# Patient Record
Sex: Female | Born: 1970 | Race: White | Hispanic: No | Smoking: Current every day smoker
Health system: Southern US, Community
[De-identification: ages and names within clinical notes are randomized; demographics above are authoritative.]

## PROBLEM LIST (undated history)

## (undated) DIAGNOSIS — M797 Fibromyalgia: Secondary | ICD-10-CM

## (undated) DIAGNOSIS — K219 Gastro-esophageal reflux disease without esophagitis: Secondary | ICD-10-CM

## (undated) DIAGNOSIS — I3139 Other pericardial effusion (noninflammatory): Secondary | ICD-10-CM

## (undated) DIAGNOSIS — Z8719 Personal history of other diseases of the digestive system: Secondary | ICD-10-CM

## (undated) DIAGNOSIS — M1990 Unspecified osteoarthritis, unspecified site: Secondary | ICD-10-CM

## (undated) DIAGNOSIS — Z86018 Personal history of other benign neoplasm: Secondary | ICD-10-CM

## (undated) DIAGNOSIS — G40909 Epilepsy, unspecified, not intractable, without status epilepticus: Secondary | ICD-10-CM

## (undated) DIAGNOSIS — G47419 Narcolepsy without cataplexy: Secondary | ICD-10-CM

## (undated) DIAGNOSIS — N186 End stage renal disease: Secondary | ICD-10-CM

## (undated) DIAGNOSIS — D649 Anemia, unspecified: Secondary | ICD-10-CM

## (undated) DIAGNOSIS — N289 Disorder of kidney and ureter, unspecified: Secondary | ICD-10-CM

## (undated) DIAGNOSIS — K5900 Constipation, unspecified: Secondary | ICD-10-CM

## (undated) DIAGNOSIS — R51 Headache: Secondary | ICD-10-CM

## (undated) DIAGNOSIS — E2740 Unspecified adrenocortical insufficiency: Secondary | ICD-10-CM

## (undated) DIAGNOSIS — F329 Major depressive disorder, single episode, unspecified: Secondary | ICD-10-CM

## (undated) DIAGNOSIS — E039 Hypothyroidism, unspecified: Secondary | ICD-10-CM

## (undated) DIAGNOSIS — Z992 Dependence on renal dialysis: Secondary | ICD-10-CM

## (undated) DIAGNOSIS — F419 Anxiety disorder, unspecified: Secondary | ICD-10-CM

## (undated) DIAGNOSIS — I639 Cerebral infarction, unspecified: Secondary | ICD-10-CM

## (undated) DIAGNOSIS — J45909 Unspecified asthma, uncomplicated: Secondary | ICD-10-CM

## (undated) DIAGNOSIS — I313 Pericardial effusion (noninflammatory): Secondary | ICD-10-CM

## (undated) DIAGNOSIS — Z87442 Personal history of urinary calculi: Secondary | ICD-10-CM

## (undated) DIAGNOSIS — G8929 Other chronic pain: Secondary | ICD-10-CM

## (undated) DIAGNOSIS — Z9289 Personal history of other medical treatment: Secondary | ICD-10-CM

## (undated) DIAGNOSIS — M549 Dorsalgia, unspecified: Secondary | ICD-10-CM

## (undated) DIAGNOSIS — R011 Cardiac murmur, unspecified: Secondary | ICD-10-CM

## (undated) DIAGNOSIS — Z86718 Personal history of other venous thrombosis and embolism: Secondary | ICD-10-CM

## (undated) DIAGNOSIS — F32A Depression, unspecified: Secondary | ICD-10-CM

## (undated) DIAGNOSIS — J42 Unspecified chronic bronchitis: Secondary | ICD-10-CM

## (undated) DIAGNOSIS — I314 Cardiac tamponade: Secondary | ICD-10-CM

## (undated) DIAGNOSIS — F3130 Bipolar disorder, current episode depressed, mild or moderate severity, unspecified: Secondary | ICD-10-CM

## (undated) DIAGNOSIS — H547 Unspecified visual loss: Secondary | ICD-10-CM

## (undated) DIAGNOSIS — J189 Pneumonia, unspecified organism: Secondary | ICD-10-CM

## (undated) DIAGNOSIS — I219 Acute myocardial infarction, unspecified: Secondary | ICD-10-CM

## (undated) DIAGNOSIS — E230 Hypopituitarism: Secondary | ICD-10-CM

## (undated) DIAGNOSIS — E785 Hyperlipidemia, unspecified: Secondary | ICD-10-CM

## (undated) HISTORY — PX: DG AV DIALYSIS GRAFT DECLOT OR: HXRAD813

---
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
## 1999-11-01 HISTORY — PX: LAPAROSCOPIC CHOLECYSTECTOMY: SUR755

## 1999-11-01 HISTORY — PX: BREAST LUMPECTOMY: SHX2

## 2007-01-14 ENCOUNTER — Emergency Department: Payer: Self-pay | Admitting: General Practice

## 2007-01-29 ENCOUNTER — Emergency Department: Payer: Self-pay | Admitting: Internal Medicine

## 2007-02-06 ENCOUNTER — Emergency Department: Payer: Self-pay | Admitting: Emergency Medicine

## 2007-02-07 ENCOUNTER — Ambulatory Visit: Payer: Self-pay | Admitting: Emergency Medicine

## 2007-03-07 ENCOUNTER — Emergency Department: Payer: Self-pay | Admitting: Emergency Medicine

## 2007-03-14 ENCOUNTER — Emergency Department: Payer: Self-pay | Admitting: Emergency Medicine

## 2007-03-15 ENCOUNTER — Emergency Department: Payer: Self-pay | Admitting: Emergency Medicine

## 2007-07-11 ENCOUNTER — Emergency Department: Payer: Self-pay | Admitting: Emergency Medicine

## 2007-07-17 ENCOUNTER — Emergency Department: Payer: Self-pay | Admitting: Emergency Medicine

## 2007-07-17 ENCOUNTER — Inpatient Hospital Stay: Payer: Self-pay | Admitting: *Deleted

## 2007-08-29 ENCOUNTER — Emergency Department: Payer: Self-pay | Admitting: Emergency Medicine

## 2007-11-15 ENCOUNTER — Other Ambulatory Visit: Payer: Self-pay

## 2007-11-15 ENCOUNTER — Emergency Department: Payer: Self-pay | Admitting: Unknown Physician Specialty

## 2007-11-22 ENCOUNTER — Emergency Department: Payer: Self-pay | Admitting: Emergency Medicine

## 2007-12-18 ENCOUNTER — Emergency Department: Payer: Self-pay | Admitting: Emergency Medicine

## 2008-02-20 ENCOUNTER — Ambulatory Visit: Payer: Self-pay | Admitting: Family Medicine

## 2008-04-20 ENCOUNTER — Emergency Department: Payer: Self-pay | Admitting: Emergency Medicine

## 2008-11-25 ENCOUNTER — Emergency Department: Payer: Self-pay | Admitting: Emergency Medicine

## 2010-10-17 ENCOUNTER — Inpatient Hospital Stay: Payer: Self-pay | Admitting: Internal Medicine

## 2010-10-27 ENCOUNTER — Ambulatory Visit: Payer: Self-pay | Admitting: Family Medicine

## 2010-11-07 ENCOUNTER — Emergency Department: Payer: Self-pay | Admitting: Emergency Medicine

## 2010-12-16 ENCOUNTER — Emergency Department: Payer: Self-pay | Admitting: Emergency Medicine

## 2011-01-12 ENCOUNTER — Ambulatory Visit: Payer: Self-pay | Admitting: Pain Medicine

## 2011-04-14 ENCOUNTER — Ambulatory Visit: Payer: Self-pay | Admitting: Internal Medicine

## 2011-04-16 ENCOUNTER — Inpatient Hospital Stay: Payer: Self-pay | Admitting: Internal Medicine

## 2011-04-16 ENCOUNTER — Ambulatory Visit: Payer: Self-pay | Admitting: Family Medicine

## 2011-05-11 ENCOUNTER — Ambulatory Visit: Payer: Self-pay | Admitting: Family Medicine

## 2011-11-30 ENCOUNTER — Emergency Department: Payer: Self-pay | Admitting: Emergency Medicine

## 2011-11-30 LAB — COMPREHENSIVE METABOLIC PANEL
Albumin: 2 g/dL — ABNORMAL LOW (ref 3.4–5.0)
Anion Gap: 9 (ref 7–16)
BUN: 22 mg/dL — ABNORMAL HIGH (ref 7–18)
Bilirubin,Total: 0.3 mg/dL (ref 0.2–1.0)
Calcium, Total: 8.1 mg/dL — ABNORMAL LOW (ref 8.5–10.1)
Co2: 23 mmol/L (ref 21–32)
EGFR (Non-African Amer.): 51 — ABNORMAL LOW
Glucose: 90 mg/dL (ref 65–99)
Osmolality: 275 (ref 275–301)
Potassium: 5.1 mmol/L (ref 3.5–5.1)
Sodium: 136 mmol/L (ref 136–145)
Total Protein: 6.4 g/dL (ref 6.4–8.2)

## 2011-11-30 LAB — CBC WITH DIFFERENTIAL/PLATELET
Basophil #: 0.1 10*3/uL (ref 0.0–0.1)
Basophil %: 0.2 %
Eosinophil #: 0.1 10*3/uL (ref 0.0–0.7)
HGB: 13.4 g/dL (ref 12.0–16.0)
MCH: 30.1 pg (ref 26.0–34.0)
MCHC: 33 g/dL (ref 32.0–36.0)
Monocyte #: 0.2 10*3/uL (ref 0.0–0.7)
Neutrophil #: 27.6 10*3/uL — ABNORMAL HIGH (ref 1.4–6.5)
Neutrophil %: 90.8 %
RDW: 16.2 % — ABNORMAL HIGH (ref 11.5–14.5)

## 2011-12-20 ENCOUNTER — Emergency Department: Payer: Self-pay | Admitting: *Deleted

## 2011-12-20 LAB — CBC WITH DIFFERENTIAL/PLATELET
Basophil #: 0 10*3/uL (ref 0.0–0.1)
Eosinophil #: 0.2 10*3/uL (ref 0.0–0.7)
Eosinophil %: 1.4 %
Lymphocyte #: 1.7 10*3/uL (ref 1.0–3.6)
Lymphocyte %: 15.7 %
Lymphocytes: 18 %
MCV: 91 fL (ref 80–100)
Monocyte %: 1 %
Neutrophil %: 81.9 %
Platelet: 281 10*3/uL (ref 150–440)
RBC: 4.54 10*6/uL (ref 3.80–5.20)
RDW: 16.7 % — ABNORMAL HIGH (ref 11.5–14.5)
Segmented Neutrophils: 77 %
WBC: 10.8 10*3/uL (ref 3.6–11.0)

## 2011-12-20 LAB — COMPREHENSIVE METABOLIC PANEL
Albumin: 2 g/dL — ABNORMAL LOW (ref 3.4–5.0)
Alkaline Phosphatase: 161 U/L — ABNORMAL HIGH (ref 50–136)
Anion Gap: 13 (ref 7–16)
BUN: 24 mg/dL — ABNORMAL HIGH (ref 7–18)
Calcium, Total: 8.3 mg/dL — ABNORMAL LOW (ref 8.5–10.1)
Chloride: 112 mmol/L — ABNORMAL HIGH (ref 98–107)
EGFR (Non-African Amer.): 33 — ABNORMAL LOW
Glucose: 82 mg/dL (ref 65–99)
SGOT(AST): 28 U/L (ref 15–37)
SGPT (ALT): 12 U/L
Total Protein: 6.2 g/dL — ABNORMAL LOW (ref 6.4–8.2)

## 2011-12-20 LAB — TROPONIN I: Troponin-I: <0.02 ng/mL

## 2011-12-20 LAB — CK TOTAL AND CKMB (NOT AT ARMC): CK, Total: 46 U/L (ref 21–215)

## 2012-03-06 ENCOUNTER — Ambulatory Visit: Payer: Self-pay | Admitting: Vascular Surgery

## 2012-03-06 LAB — POTASSIUM: Potassium: 4.6 mmol/L (ref 3.5–5.1)

## 2012-03-11 ENCOUNTER — Emergency Department: Payer: Self-pay | Admitting: Emergency Medicine

## 2012-03-12 ENCOUNTER — Ambulatory Visit: Payer: Self-pay | Admitting: Vascular Surgery

## 2012-08-23 ENCOUNTER — Ambulatory Visit: Payer: Self-pay | Admitting: Family Medicine

## 2012-08-23 LAB — URINALYSIS, COMPLETE
Glucose,UR: NEGATIVE mg/dL (ref 0–75)
Ketone: NEGATIVE
Nitrite: NEGATIVE
Specific Gravity: 1.015 (ref 1.003–1.030)

## 2012-08-25 LAB — URINE CULTURE

## 2012-10-17 ENCOUNTER — Ambulatory Visit: Payer: Self-pay | Admitting: Vascular Surgery

## 2012-10-22 ENCOUNTER — Emergency Department: Payer: Self-pay | Admitting: Emergency Medicine

## 2012-11-06 ENCOUNTER — Ambulatory Visit: Payer: Self-pay | Admitting: Vascular Surgery

## 2012-12-08 ENCOUNTER — Emergency Department: Payer: Self-pay | Admitting: Emergency Medicine

## 2012-12-08 LAB — COMPREHENSIVE METABOLIC PANEL
Albumin: 3 g/dL — ABNORMAL LOW (ref 3.4–5.0)
BUN: 64 mg/dL — ABNORMAL HIGH (ref 7–18)
Bilirubin,Total: 0.2 mg/dL (ref 0.2–1.0)
Chloride: 103 mmol/L (ref 98–107)
Creatinine: 4.7 mg/dL — ABNORMAL HIGH (ref 0.60–1.30)
EGFR (African American): 13 — ABNORMAL LOW
EGFR (Non-African Amer.): 11 — ABNORMAL LOW
Glucose: 75 mg/dL (ref 65–99)
Potassium: 4.1 mmol/L (ref 3.5–5.1)
SGOT(AST): 31 U/L (ref 15–37)

## 2012-12-08 LAB — CBC WITH DIFFERENTIAL/PLATELET
Eosinophil: 3 %
HCT: 29.8 % — ABNORMAL LOW (ref 35.0–47.0)
HGB: 9.7 g/dL — ABNORMAL LOW (ref 12.0–16.0)
Lymphocytes: 21 %
MCH: 31.6 pg (ref 26.0–34.0)
MCV: 97 fL (ref 80–100)
Monocytes: 3 %
RBC: 3.07 10*6/uL — ABNORMAL LOW (ref 3.80–5.20)
RDW: 16.2 % — ABNORMAL HIGH (ref 11.5–14.5)
Segmented Neutrophils: 72 %
WBC: 12.6 10*3/uL — ABNORMAL HIGH (ref 3.6–11.0)

## 2012-12-28 ENCOUNTER — Ambulatory Visit: Payer: Self-pay | Admitting: Family Medicine

## 2013-01-12 ENCOUNTER — Inpatient Hospital Stay: Payer: Self-pay | Admitting: Specialist

## 2013-01-12 LAB — URINALYSIS, COMPLETE
Bacteria: NONE SEEN
Bilirubin,UR: NEGATIVE
Glucose,UR: NEGATIVE mg/dL (ref 0–75)
Ketone: NEGATIVE
Nitrite: NEGATIVE
Ph: 8 (ref 4.5–8.0)
Protein: 100
RBC,UR: 4 /HPF (ref 0–5)
Squamous Epithelial: <1
WBC UR: 9 /HPF (ref 0–5)

## 2013-01-12 LAB — CBC
HCT: 38.7 % (ref 35.0–47.0)
MCH: 31.5 pg (ref 26.0–34.0)
MCHC: 32.8 g/dL (ref 32.0–36.0)
Platelet: 216 10*3/uL (ref 150–440)
RBC: 4.03 10*6/uL (ref 3.80–5.20)
RDW: 16.8 % — ABNORMAL HIGH (ref 11.5–14.5)
WBC: 13.9 10*3/uL — ABNORMAL HIGH (ref 3.6–11.0)

## 2013-01-12 LAB — COMPREHENSIVE METABOLIC PANEL
Albumin: 3.5 g/dL (ref 3.4–5.0)
Alkaline Phosphatase: 83 U/L (ref 50–136)
Anion Gap: 11 (ref 7–16)
Bilirubin,Total: 0.6 mg/dL (ref 0.2–1.0)
Chloride: 101 mmol/L (ref 98–107)
Creatinine: 4.14 mg/dL — ABNORMAL HIGH (ref 0.60–1.30)
EGFR (African American): 15 — ABNORMAL LOW
EGFR (Non-African Amer.): 13 — ABNORMAL LOW
Glucose: 74 mg/dL (ref 65–99)
Osmolality: 280 (ref 275–301)
SGOT(AST): 54 U/L — ABNORMAL HIGH (ref 15–37)
SGPT (ALT): 22 U/L (ref 12–78)
Sodium: 135 mmol/L — ABNORMAL LOW (ref 136–145)
Total Protein: 7.4 g/dL (ref 6.4–8.2)

## 2013-01-12 LAB — PHOSPHORUS: Phosphorus: 4.6 mg/dL (ref 2.5–4.9)

## 2013-01-12 LAB — MAGNESIUM: Magnesium: 2.4 mg/dL

## 2013-01-12 LAB — CK TOTAL AND CKMB (NOT AT ARMC): CK, Total: 73 U/L (ref 21–215)

## 2013-01-13 LAB — BASIC METABOLIC PANEL
BUN: 47 mg/dL — ABNORMAL HIGH (ref 7–18)
Calcium, Total: 7.6 mg/dL — ABNORMAL LOW (ref 8.5–10.1)
Chloride: 105 mmol/L (ref 98–107)
Co2: 25 mmol/L (ref 21–32)
Creatinine: 4.48 mg/dL — ABNORMAL HIGH (ref 0.60–1.30)
EGFR (African American): 13 — ABNORMAL LOW
EGFR (Non-African Amer.): 12 — ABNORMAL LOW
Sodium: 140 mmol/L (ref 136–145)

## 2013-01-13 LAB — CBC WITH DIFFERENTIAL/PLATELET
Basophil %: 0.9 %
HCT: 32.7 % — ABNORMAL LOW (ref 35.0–47.0)
HGB: 10.5 g/dL — ABNORMAL LOW (ref 12.0–16.0)
Lymphocyte #: 2.2 10*3/uL (ref 1.0–3.6)
Lymphocyte %: 27.8 %
MCH: 30.8 pg (ref 26.0–34.0)
MCV: 96 fL (ref 80–100)
Monocyte #: 0.4 x10 3/mm (ref 0.2–0.9)
Neutrophil #: 4.9 10*3/uL (ref 1.4–6.5)
Platelet: 211 10*3/uL (ref 150–440)
RBC: 3.39 10*6/uL — ABNORMAL LOW (ref 3.80–5.20)
WBC: 8 10*3/uL (ref 3.6–11.0)

## 2013-01-13 LAB — CLOSTRIDIUM DIFFICILE BY PCR

## 2013-01-13 LAB — MAGNESIUM: Magnesium: 2.2 mg/dL

## 2013-04-02 ENCOUNTER — Ambulatory Visit: Payer: Self-pay | Admitting: Vascular Surgery

## 2013-04-02 LAB — SODIUM: Sodium: 140 mmol/L (ref 136–145)

## 2013-04-14 ENCOUNTER — Emergency Department: Payer: Self-pay | Admitting: Emergency Medicine

## 2013-04-15 LAB — COMPREHENSIVE METABOLIC PANEL
Anion Gap: 6 — ABNORMAL LOW (ref 7–16)
BUN: 25 mg/dL — ABNORMAL HIGH (ref 7–18)
Bilirubin,Total: 0.7 mg/dL (ref 0.2–1.0)
Calcium, Total: 8.2 mg/dL — ABNORMAL LOW (ref 8.5–10.1)
Chloride: 100 mmol/L (ref 98–107)
Co2: 30 mmol/L (ref 21–32)
Glucose: 109 mg/dL — ABNORMAL HIGH (ref 65–99)
Osmolality: 277 (ref 275–301)
Potassium: 4 mmol/L (ref 3.5–5.1)
SGPT (ALT): 24 U/L (ref 12–78)
Total Protein: 6.3 g/dL — ABNORMAL LOW (ref 6.4–8.2)

## 2013-04-15 LAB — CBC
MCH: 31.8 pg (ref 26.0–34.0)
Platelet: 251 10*3/uL (ref 150–440)
WBC: 12.7 10*3/uL — ABNORMAL HIGH (ref 3.6–11.0)

## 2013-04-29 ENCOUNTER — Inpatient Hospital Stay: Payer: Self-pay | Admitting: Internal Medicine

## 2013-04-29 LAB — COMPREHENSIVE METABOLIC PANEL
Calcium, Total: 8.1 mg/dL — ABNORMAL LOW (ref 8.5–10.1)
Chloride: 94 mmol/L — ABNORMAL LOW (ref 98–107)
EGFR (Non-African Amer.): 12 — ABNORMAL LOW
Potassium: 5.5 mmol/L — ABNORMAL HIGH (ref 3.5–5.1)
SGPT (ALT): 23 U/L (ref 12–78)
Total Protein: 6.4 g/dL (ref 6.4–8.2)

## 2013-04-29 LAB — DRUG SCREEN, URINE
Amphetamines, Ur Screen: NEGATIVE (ref ?–1000)
Barbiturates, Ur Screen: NEGATIVE (ref ?–200)
Benzodiazepine, Ur Scrn: NEGATIVE (ref ?–200)
Cannabinoid 50 Ng, Ur ~~LOC~~: NEGATIVE (ref ?–50)
MDMA (Ecstasy)Ur Screen: NEGATIVE (ref ?–500)
Methadone, Ur Screen: NEGATIVE (ref ?–300)
Phencyclidine (PCP) Ur S: NEGATIVE (ref ?–25)

## 2013-04-29 LAB — CBC WITH DIFFERENTIAL/PLATELET
Eosinophil #: 0.1 10*3/uL (ref 0.0–0.7)
Eosinophil %: 0.3 %
HCT: 31.2 % — ABNORMAL LOW (ref 35.0–47.0)
MCHC: 33 g/dL (ref 32.0–36.0)
Monocyte %: 1.9 %
Platelet: 257 10*3/uL (ref 150–440)
RBC: 3.26 10*6/uL — ABNORMAL LOW (ref 3.80–5.20)
RDW: 15.7 % — ABNORMAL HIGH (ref 11.5–14.5)

## 2013-04-29 LAB — BASIC METABOLIC PANEL
Anion Gap: 8 (ref 7–16)
BUN: 29 mg/dL — ABNORMAL HIGH (ref 7–18)
Chloride: 96 mmol/L — ABNORMAL LOW (ref 98–107)
EGFR (African American): 12 — ABNORMAL LOW
EGFR (Non-African Amer.): 11 — ABNORMAL LOW

## 2013-04-29 LAB — URINALYSIS, COMPLETE
Bilirubin,UR: NEGATIVE
Blood: NEGATIVE
Leukocyte Esterase: NEGATIVE
Nitrite: NEGATIVE
Ph: 7 (ref 4.5–8.0)
RBC,UR: <1 /HPF (ref 0–5)
Squamous Epithelial: NONE SEEN
WBC UR: 1 /HPF (ref 0–5)

## 2013-04-29 LAB — TROPONIN I: Troponin-I: <0.02 ng/mL

## 2013-04-29 LAB — CK TOTAL AND CKMB (NOT AT ARMC)
CK, Total: 61 U/L (ref 21–215)
CK, Total: 42 U/L (ref 21–215)

## 2013-04-30 LAB — CBC WITH DIFFERENTIAL/PLATELET
Basophil %: 0.5 %
Eosinophil %: 0.1 %
HGB: 9.5 g/dL — ABNORMAL LOW (ref 12.0–16.0)
Lymphocyte %: 6.5 %
MCH: 32.3 pg (ref 26.0–34.0)
Monocyte #: 0.6 x10 3/mm (ref 0.2–0.9)
Neutrophil #: 21.3 10*3/uL — ABNORMAL HIGH (ref 1.4–6.5)
RBC: 2.93 10*6/uL — ABNORMAL LOW (ref 3.80–5.20)
RDW: 16 % — ABNORMAL HIGH (ref 11.5–14.5)

## 2013-04-30 LAB — BASIC METABOLIC PANEL
Anion Gap: 13 (ref 7–16)
BUN: 42 mg/dL — ABNORMAL HIGH (ref 7–18)
Calcium, Total: 8.4 mg/dL — ABNORMAL LOW (ref 8.5–10.1)
Co2: 24 mmol/L (ref 21–32)
EGFR (African American): 11 — ABNORMAL LOW
Glucose: 90 mg/dL (ref 65–99)
Osmolality: 265 (ref 275–301)

## 2013-05-01 LAB — CBC WITH DIFFERENTIAL/PLATELET
Basophil #: 0 10*3/uL (ref 0.0–0.1)
Basophil %: 0 %
Eosinophil #: 0 10*3/uL (ref 0.0–0.7)
MCHC: 33.8 g/dL (ref 32.0–36.0)
Monocyte #: 0.5 x10 3/mm (ref 0.2–0.9)
Monocyte %: 2.9 %
Neutrophil %: 88.4 %
Platelet: 260 10*3/uL (ref 150–440)

## 2013-05-04 LAB — CULTURE, BLOOD (SINGLE)

## 2013-07-04 ENCOUNTER — Inpatient Hospital Stay: Payer: Self-pay | Admitting: Internal Medicine

## 2013-07-04 LAB — URINALYSIS, COMPLETE
Blood: NEGATIVE
Glucose,UR: NEGATIVE mg/dL (ref 0–75)
Ketone: NEGATIVE
Nitrite: NEGATIVE
Ph: 6 (ref 4.5–8.0)
Protein: 100

## 2013-07-04 LAB — CBC
MCHC: 34 g/dL (ref 32.0–36.0)
MCV: 97 fL (ref 80–100)
RDW: 15.8 % — ABNORMAL HIGH (ref 11.5–14.5)

## 2013-07-04 LAB — CK TOTAL AND CKMB (NOT AT ARMC)
CK, Total: 34 U/L (ref 21–215)
CK-MB: <0.5 ng/mL — ABNORMAL LOW (ref 0.5–3.6)

## 2013-07-04 LAB — COMPREHENSIVE METABOLIC PANEL
Albumin: 3 g/dL — ABNORMAL LOW (ref 3.4–5.0)
Alkaline Phosphatase: 56 U/L (ref 50–136)
BUN: 84 mg/dL — ABNORMAL HIGH (ref 7–18)
Bilirubin,Total: 0.6 mg/dL (ref 0.2–1.0)
Calcium, Total: 7.9 mg/dL — ABNORMAL LOW (ref 8.5–10.1)
Chloride: 92 mmol/L — ABNORMAL LOW (ref 98–107)
Co2: 25 mmol/L (ref 21–32)
Creatinine: 5.39 mg/dL — ABNORMAL HIGH (ref 0.60–1.30)
EGFR (African American): 11 — ABNORMAL LOW
EGFR (Non-African Amer.): 9 — ABNORMAL LOW
Glucose: 78 mg/dL (ref 65–99)
Osmolality: 280 (ref 275–301)
Sodium: 127 mmol/L — ABNORMAL LOW (ref 136–145)
Total Protein: 6.3 g/dL — ABNORMAL LOW (ref 6.4–8.2)

## 2013-07-04 LAB — TROPONIN I: Troponin-I: <0.02 ng/mL

## 2013-07-04 LAB — PROTIME-INR: INR: 1.2

## 2013-07-04 LAB — APTT: Activated PTT: 25.2 secs (ref 23.6–35.9)

## 2013-07-05 LAB — BASIC METABOLIC PANEL WITH GFR
Anion Gap: 10
BUN: 89 mg/dL — ABNORMAL HIGH
Calcium, Total: 7.6 mg/dL — ABNORMAL LOW
Chloride: 93 mmol/L — ABNORMAL LOW
Co2: 26 mmol/L
Creatinine: 5.69 mg/dL — ABNORMAL HIGH
EGFR (African American): 10 — ABNORMAL LOW
EGFR (Non-African Amer.): 9 — ABNORMAL LOW
Glucose: 66 mg/dL
Osmolality: 284
Potassium: 3.6 mmol/L
Sodium: 129 mmol/L — ABNORMAL LOW

## 2013-07-05 LAB — CBC WITH DIFFERENTIAL/PLATELET
Basophil #: 0.1 x10 3/mm 3
Basophil %: 0.9 %
Eosinophil #: 0.2 x10 3/mm 3
Eosinophil %: 2.4 %
HCT: 28.5 % — ABNORMAL LOW
HGB: 9.6 g/dL — ABNORMAL LOW
Lymphocyte %: 30 %
Lymphs Abs: 2.6 x10 3/mm 3
MCH: 32.9 pg
MCHC: 33.8 g/dL
MCV: 98 fL
Monocyte #: 0.4 "x10 3/mm "
Monocyte %: 4.5 %
Neutrophil #: 5.4 x10 3/mm 3
Neutrophil %: 62.2 %
Platelet: 263 x10 3/mm 3
RBC: 2.92 X10 6/mm 3 — ABNORMAL LOW
RDW: 15.1 % — ABNORMAL HIGH
WBC: 8.7 x10 3/mm 3

## 2013-07-05 LAB — TSH: Thyroid Stimulating Horm: <0.01 u[IU]/mL — ABNORMAL LOW

## 2013-07-05 LAB — MAGNESIUM: Magnesium: 2.9 mg/dL — ABNORMAL HIGH

## 2013-07-06 ENCOUNTER — Ambulatory Visit: Payer: Self-pay | Admitting: Orthopedic Surgery

## 2013-07-07 LAB — BASIC METABOLIC PANEL
Anion Gap: 6 — ABNORMAL LOW (ref 7–16)
Calcium, Total: 7.6 mg/dL — ABNORMAL LOW (ref 8.5–10.1)
Chloride: 96 mmol/L — ABNORMAL LOW (ref 98–107)
Co2: 31 mmol/L (ref 21–32)
Creatinine: 2.93 mg/dL — ABNORMAL HIGH (ref 0.60–1.30)
EGFR (African American): 22 — ABNORMAL LOW
Osmolality: 267 (ref 275–301)
Potassium: 3.4 mmol/L — ABNORMAL LOW (ref 3.5–5.1)

## 2013-07-18 ENCOUNTER — Emergency Department: Payer: Self-pay | Admitting: Emergency Medicine

## 2013-07-18 LAB — URINALYSIS, COMPLETE
Glucose,UR: NEGATIVE mg/dL (ref 0–75)
Leukocyte Esterase: NEGATIVE
Nitrite: NEGATIVE
Ph: 7 (ref 4.5–8.0)
Protein: 100
RBC,UR: 1 /HPF (ref 0–5)
Specific Gravity: 1.005 (ref 1.003–1.030)
WBC UR: 1 /HPF (ref 0–5)

## 2013-07-18 LAB — CBC WITH DIFFERENTIAL/PLATELET
Basophil #: 0.1 10*3/uL (ref 0.0–0.1)
Basophil %: 1.2 %
HCT: 32 % — ABNORMAL LOW (ref 35.0–47.0)
Lymphocyte #: 1.7 10*3/uL (ref 1.0–3.6)
Lymphocyte %: 21.6 %
MCHC: 34.3 g/dL (ref 32.0–36.0)
MCV: 97 fL (ref 80–100)
Monocyte #: 0.6 x10 3/mm (ref 0.2–0.9)
Neutrophil #: 5.4 10*3/uL (ref 1.4–6.5)
RDW: 15.1 % — ABNORMAL HIGH (ref 11.5–14.5)
WBC: 7.9 10*3/uL (ref 3.6–11.0)

## 2013-07-18 LAB — PROTIME-INR
INR: 1.2
Prothrombin Time: 15.1 secs — ABNORMAL HIGH (ref 11.5–14.7)

## 2013-07-18 LAB — COMPREHENSIVE METABOLIC PANEL
Co2: 26 mmol/L (ref 21–32)
Creatinine: 5.63 mg/dL — ABNORMAL HIGH (ref 0.60–1.30)
EGFR (African American): 10 — ABNORMAL LOW
Glucose: 82 mg/dL (ref 65–99)
Osmolality: 277 (ref 275–301)
SGOT(AST): 37 U/L (ref 15–37)
SGPT (ALT): 20 U/L (ref 12–78)
Total Protein: 6.1 g/dL — ABNORMAL LOW (ref 6.4–8.2)

## 2013-07-18 LAB — TROPONIN I: Troponin-I: <0.02 ng/mL

## 2013-07-18 LAB — APTT: Activated PTT: 41.6 secs — ABNORMAL HIGH (ref 23.6–35.9)

## 2013-08-29 ENCOUNTER — Other Ambulatory Visit: Payer: Self-pay | Admitting: Nephrology

## 2013-08-29 LAB — POTASSIUM: Potassium: 4 mmol/L (ref 3.5–5.1)

## 2013-09-19 ENCOUNTER — Emergency Department: Payer: Self-pay | Admitting: Emergency Medicine

## 2013-09-20 LAB — CBC WITH DIFFERENTIAL/PLATELET
Basophil #: 0.1 10*3/uL (ref 0.0–0.1)
Basophil %: 0.6 %
Eosinophil #: 0.1 10*3/uL (ref 0.0–0.7)
Lymphocyte #: 1.2 10*3/uL (ref 1.0–3.6)
MCH: 33.2 pg (ref 26.0–34.0)
MCHC: 33.5 g/dL (ref 32.0–36.0)
MCV: 99 fL (ref 80–100)
Monocyte %: 2.1 %
Neutrophil #: 7.5 10*3/uL — ABNORMAL HIGH (ref 1.4–6.5)
Neutrophil %: 83.2 %
Platelet: 210 10*3/uL (ref 150–440)

## 2013-09-20 LAB — COMPREHENSIVE METABOLIC PANEL
Alkaline Phosphatase: 74 U/L (ref 50–136)
Anion Gap: 8 (ref 7–16)
Chloride: 97 mmol/L — ABNORMAL LOW (ref 98–107)
Co2: 28 mmol/L (ref 21–32)
Creatinine: 6.12 mg/dL — ABNORMAL HIGH (ref 0.60–1.30)
EGFR (Non-African Amer.): 8 — ABNORMAL LOW
Osmolality: 289 (ref 275–301)
Potassium: 5.5 mmol/L — ABNORMAL HIGH (ref 3.5–5.1)
SGOT(AST): 46 U/L — ABNORMAL HIGH (ref 15–37)
SGPT (ALT): 28 U/L (ref 12–78)
Total Protein: 6.6 g/dL (ref 6.4–8.2)

## 2013-09-24 ENCOUNTER — Emergency Department: Payer: Self-pay | Admitting: Emergency Medicine

## 2013-09-24 LAB — URINALYSIS, COMPLETE
Glucose,UR: NEGATIVE mg/dL (ref 0–75)
Hyaline Cast: 16
Ketone: NEGATIVE
Nitrite: NEGATIVE
Ph: 5 (ref 4.5–8.0)
Protein: 30
Squamous Epithelial: <1
WBC UR: 157 /HPF (ref 0–5)

## 2013-09-24 LAB — CBC
HCT: 30.3 % — ABNORMAL LOW (ref 35.0–47.0)
RBC: 3.04 10*6/uL — ABNORMAL LOW (ref 3.80–5.20)
RDW: 15.3 % — ABNORMAL HIGH (ref 11.5–14.5)
WBC: 9.4 10*3/uL (ref 3.6–11.0)

## 2013-09-24 LAB — COMPREHENSIVE METABOLIC PANEL
Albumin: 3.4 g/dL (ref 3.4–5.0)
Alkaline Phosphatase: 76 U/L
Anion Gap: 10 (ref 7–16)
BUN: 88 mg/dL — ABNORMAL HIGH (ref 7–18)
Calcium, Total: 8.5 mg/dL (ref 8.5–10.1)
Chloride: 95 mmol/L — ABNORMAL LOW (ref 98–107)
Creatinine: 6.57 mg/dL — ABNORMAL HIGH (ref 0.60–1.30)
EGFR (Non-African Amer.): 7 — ABNORMAL LOW
Glucose: 72 mg/dL (ref 65–99)
SGPT (ALT): 37 U/L (ref 12–78)
Sodium: 131 mmol/L — ABNORMAL LOW (ref 136–145)
Total Protein: 6.8 g/dL (ref 6.4–8.2)

## 2013-09-24 LAB — TROPONIN I: Troponin-I: <0.02 ng/mL

## 2013-10-28 ENCOUNTER — Emergency Department: Payer: Self-pay | Admitting: Emergency Medicine

## 2013-10-28 LAB — BASIC METABOLIC PANEL
Anion Gap: 9 (ref 7–16)
Calcium, Total: 7.2 mg/dL — ABNORMAL LOW (ref 8.5–10.1)
Chloride: 98 mmol/L (ref 98–107)
Co2: 26 mmol/L (ref 21–32)
Creatinine: 6.21 mg/dL — ABNORMAL HIGH (ref 0.60–1.30)
EGFR (Non-African Amer.): 8 — ABNORMAL LOW
Potassium: 5.4 mmol/L — ABNORMAL HIGH (ref 3.5–5.1)
Sodium: 133 mmol/L — ABNORMAL LOW (ref 136–145)

## 2013-10-28 LAB — CBC
HGB: 9.1 g/dL — ABNORMAL LOW (ref 12.0–16.0)
MCH: 34.1 pg — ABNORMAL HIGH (ref 26.0–34.0)
MCHC: 33.5 g/dL (ref 32.0–36.0)
MCV: 102 fL — ABNORMAL HIGH (ref 80–100)
Platelet: 251 10*3/uL (ref 150–440)
RBC: 2.67 10*6/uL — ABNORMAL LOW (ref 3.80–5.20)
WBC: 9.6 10*3/uL (ref 3.6–11.0)

## 2013-10-28 LAB — RAPID INFLUENZA A&B ANTIGENS

## 2013-10-28 LAB — TROPONIN I: Troponin-I: 0.02 ng/mL

## 2013-11-15 ENCOUNTER — Ambulatory Visit: Payer: Self-pay | Admitting: Physician Assistant

## 2013-11-15 LAB — CBC WITH DIFFERENTIAL/PLATELET
BASOS PCT: 1.5 %
Basophil #: 0.2 10*3/uL — ABNORMAL HIGH (ref 0.0–0.1)
Eosinophil #: 0.1 10*3/uL (ref 0.0–0.7)
Eosinophil %: 0.7 %
HCT: 29.5 % — AB (ref 35.0–47.0)
HGB: 9.4 g/dL — ABNORMAL LOW (ref 12.0–16.0)
Lymphocyte #: 1.9 10*3/uL (ref 1.0–3.6)
Lymphocyte %: 18.7 %
MCH: 34.2 pg — AB (ref 26.0–34.0)
MCHC: 32 g/dL (ref 32.0–36.0)
MCV: 107 fL — ABNORMAL HIGH (ref 80–100)
MONO ABS: 0.6 x10 3/mm (ref 0.2–0.9)
MONOS PCT: 5.8 %
Neutrophil #: 7.5 10*3/uL — ABNORMAL HIGH (ref 1.4–6.5)
Neutrophil %: 73.3 %
Platelet: 244 10*3/uL (ref 150–440)
RBC: 2.76 10*6/uL — ABNORMAL LOW (ref 3.80–5.20)
RDW: 18.2 % — ABNORMAL HIGH (ref 11.5–14.5)
WBC: 10.2 10*3/uL (ref 3.6–11.0)

## 2013-11-15 LAB — URINALYSIS, COMPLETE
Bilirubin,UR: NEGATIVE
Blood: NEGATIVE
Glucose,UR: NEGATIVE mg/dL (ref 0–75)
Ketone: NEGATIVE
LEUKOCYTE ESTERASE: NEGATIVE
NITRITE: NEGATIVE
PH: 8 (ref 4.5–8.0)
SPECIFIC GRAVITY: 1.005 (ref 1.003–1.030)

## 2013-11-15 LAB — BASIC METABOLIC PANEL
ANION GAP: 9 (ref 7–16)
BUN: 58 mg/dL — ABNORMAL HIGH (ref 7–18)
CALCIUM: 8.3 mg/dL — AB (ref 8.5–10.1)
CO2: 29 mmol/L (ref 21–32)
Chloride: 100 mmol/L (ref 98–107)
Creatinine: 5.28 mg/dL — ABNORMAL HIGH (ref 0.60–1.30)
EGFR (African American): 11 — ABNORMAL LOW
GFR CALC NON AF AMER: 9 — AB
GLUCOSE: 91 mg/dL (ref 65–99)
Osmolality: 291 (ref 275–301)
Potassium: 5.1 mmol/L (ref 3.5–5.1)
Sodium: 138 mmol/L (ref 136–145)

## 2013-11-15 LAB — RAPID STREP-A WITH REFLX: Micro Text Report: NEGATIVE

## 2013-11-18 LAB — BETA STREP CULTURE(ARMC)

## 2014-07-09 ENCOUNTER — Ambulatory Visit: Payer: Self-pay | Admitting: Internal Medicine

## 2014-07-17 ENCOUNTER — Ambulatory Visit: Payer: Self-pay

## 2014-11-25 ENCOUNTER — Emergency Department: Payer: Self-pay | Admitting: Emergency Medicine

## 2014-11-25 LAB — COMPREHENSIVE METABOLIC PANEL
ALBUMIN: 3.3 g/dL — AB (ref 3.4–5.0)
ALK PHOS: 69 U/L (ref 46–116)
ANION GAP: 15 (ref 7–16)
BILIRUBIN TOTAL: 0.4 mg/dL (ref 0.2–1.0)
BUN: 56 mg/dL — AB (ref 7–18)
Calcium, Total: 7.6 mg/dL — ABNORMAL LOW (ref 8.5–10.1)
Chloride: 100 mmol/L (ref 98–107)
Co2: 25 mmol/L (ref 21–32)
Creatinine: 6.17 mg/dL — ABNORMAL HIGH (ref 0.60–1.30)
EGFR (African American): 10 — ABNORMAL LOW
EGFR (Non-African Amer.): 8 — ABNORMAL LOW
Glucose: 104 mg/dL — ABNORMAL HIGH (ref 65–99)
OSMOLALITY: 295 (ref 275–301)
POTASSIUM: 3.7 mmol/L (ref 3.5–5.1)
SGOT(AST): 103 U/L — ABNORMAL HIGH (ref 15–37)
SGPT (ALT): 79 U/L — ABNORMAL HIGH (ref 14–63)
Sodium: 140 mmol/L (ref 136–145)
TOTAL PROTEIN: 6.6 g/dL (ref 6.4–8.2)

## 2014-11-25 LAB — CBC
HCT: 37.5 % (ref 35.0–47.0)
HGB: 12.3 g/dL (ref 12.0–16.0)
MCH: 33.3 pg (ref 26.0–34.0)
MCHC: 32.8 g/dL (ref 32.0–36.0)
MCV: 102 fL — AB (ref 80–100)
PLATELETS: 230 10*3/uL (ref 150–440)
RBC: 3.69 10*6/uL — AB (ref 3.80–5.20)
RDW: 15.1 % — ABNORMAL HIGH (ref 11.5–14.5)
WBC: 15.9 10*3/uL — ABNORMAL HIGH (ref 3.6–11.0)

## 2014-11-25 LAB — TROPONIN I: Troponin-I: <0.02 ng/mL

## 2015-02-17 NOTE — Op Note (Signed)
PATIENT NAME:  Erin [name], Erin [name] MR#:  [id number] DATE OF BIRTH:  Oct 02, 1971  DATE OF PROCEDURE:  10/17/2012  PREOPERATIVE DIAGNOSES: 1. Endstage renal disease.  2. Poorly functioning right jugular PermCath.  3. Obesity.  4. Bipolar disorder.   POSTOPERATIVE DIAGNOSES:  1. Endstage renal disease.  2. Poorly functioning right jugular PermCath.  3. Obesity.  4. Bipolar disorder.   PROCEDURE: Removal and replacement through same venous access, right jugular PermCath, with fluoroscopic guidance.   SURGEON: Annice Needy, M.D.   ANESTHESIA: Local with moderate conscious sedation.  ESTIMATED BLOOD LOSS: Approximately 25 mL.  FLUOROSCOPY TIME: Less than 1 minute.   CONTRAST USED: None.   INDICATION FOR PROCEDURE: This is a 44 year old white female with end-stage renal disease and a poorly functioning PermCath. We are asked to exchange this.   DESCRIPTION OF PROCEDURE: The patient was brought to the vascular interventional radiology suite. The right neck and chest were sterilely prepped and draped and a sterile surgical field was created. The existing catheter was rewired with an Amplatz super stiff wire. The catheter was dissected free from the fibrous sheath surrounding the cuff and that catheter was removed in its entirety. With the use of fluoroscopic guidance, a shorter, 19 cm tip to cuff, tunneled hemodialysis catheter was selected. The previous 23 cm catheter appeared a bit long. This catheter was placed over the wire and the wire was removed. The catheter tip was parked in the right atrium. The cuff was approximately one-half the length from the original access site to the exit site. It was secured to the chest wall with 2 Prolene sutures and a 4-0 Monocryl was used as a pursestring around the catheter exit site. The patient tolerated the procedure well and was taken to the recovery room in stable condition. ____________________________ Annice Needy, MD jsd:sb D: 10/17/2012 16:08:46  ET T: 10/18/2012 08:47:27 ET JOB#: [id number]  cc: Annice Needy, MD, <Dictator> Annice Needy MD ELECTRONICALLY SIGNED 10/19/2012 18:35

## 2015-02-20 NOTE — Op Note (Signed)
PATIENT NAME:  Erin [name], BAINBRIDGE MR#:  [id number] DATE OF BIRTH:  09-25-1971  DATE OF PROCEDURE:  04/02/2013  PREOPERATIVE DIAGNOSES:  1.  Thrombosis of right thigh arteriovenous graft.  2.  End-stage renal disease requiring hemodialysis.  3.  Complication, arteriovenous dialysis device.  4.  Superior vena cava syndrome.   POSTOPERATIVE DIAGNOSES: 1.  Thrombosis of right thigh arteriovenous graft.  2.  End-stage renal disease requiring hemodialysis.  3.  Complication, arteriovenous dialysis device.  4.  Superior vena cava syndrome.   PROCEDURES PERFORMED:  1.  Contrast injection of right thigh arteriovenous graft.  2.  Stent placement using and an 8 x 15 Viabahn stent for treatment of pseudoaneurysm.  3.  Percutaneous transluminal angioplasty of the venous outflow at the level of the common femoral vein.  DICTATION DISCONTINUED, REPORT ALREADY IN SYSTEM.  ____________________________ Renford Dills, MD ggs:aw D: 04/03/2013 14:06:00 ET T: 04/03/2013 14:21:17 ET JOB#: [id number]  cc: Renford Dills, MD, <Dictator> Renford Dills MD ELECTRONICALLY SIGNED 04/17/2013 16:07

## 2015-02-20 NOTE — Op Note (Signed)
PATIENT NAME:  Erin [name], Erin [name] MR#:  [id number] DATE OF BIRTH:  03-08-1971  DATE OF PROCEDURE:  04/02/2013  PREOPERATIVE DIAGNOSES: 1.  Complication, arteriovenous dialysis device with thrombosis, left thigh graft.  2.  End-stage renal disease, requiring hemodialysis.  3.  Superior vena cava syndrome.   POSTOPERATIVE DIAGNOSES: 1.  Complication arteriovenous dialysis device with thrombosis, left thigh graft.  2.  End-stage renal disease requiring hemodialysis.  3.  Superior vena cava syndrome.   PROCEDURES PERFORMED: 1.  Contrast injection, left thigh arteriovenous graft.  2.  Mechanical thrombectomy with Trerotola device, left thigh arteriovenous graft.  3.  Percutaneous transluminal angioplasty of the apex of the arteriovenous graft, venous portion.  4.  Percutaneous transluminal angioplasty of the venous anastomosis, separate and distinct lesion.  5.  Percutaneous transluminal angioplasty of the proximal superficial femoral artery.   SURGEON: Renford Dills, M.D.   SEDATION: Precedex drip.   CONTRAST USED: Isovue 40 mL.   FLUOROSCOPY TIME: 6 minutes.   ACCESS:  1.  6-French sheath, antegrade direction, left thigh arteriovenous graft.  2.  6-French sheath, retrograde direction, left thigh arteriovenous graft.   INDICATIONS: Erin [name] is a 44 year old woman who presented to dialysis with a loss of the thrill and bruit in her graft. She was therefore sent for a thrombectomy. Risks and benefits were reviewed. All questions answered. The patient has agreed to proceed.   DESCRIPTION OF PROCEDURE: The patient was taken to special procedures and placed in the supine position. After adequate sedation was achieved with a Precedex drip, left thigh was prepped and draped in a sterile fashion.   One-percent lidocaine was infiltrated in the soft tissues overlying the lateral portion of the graft, and access was obtained with a micropuncture needle, a MicroWire followed by a  Microsheath,  J wire followed by a 6-French sheath. Hand injection of contrast demonstrates thrombus within the graft. KMP catheter and a Magic Torque wire were then advanced into the common femoral vein, and hand injection of contrast demonstrates patency of the common femoral iliac vein as well as the inferior vena cava. Four-thousand units of heparin were is given. A Trerotola device was opened on the field and a mechanical thrombectomy was performed of the venous portion. Several passes were made. Followup angiography demonstrated resolution of the majority of the thrombus.   One-percent lidocaine was infiltrated in the soft tissues, and the medial limb was accessed in a retrograde direction, MicroWire followed by micro sheath, J wire followed by a 6-French sheath. KMP catheter and Magic Torque wire were negotiated into the SFA and hand injection of contrast was utilized to demonstrate the anatomy. At this point in time, the Kumpe was positioned, essentially at the anastomosis, and there was distal flow. There was thrombus noted in the arterial portion. There was no retrograde filling of the proximal SFA into the common femoral.   Trerotola device was then used to make multiple passes in the arterial portion.   Ultimately forward flow within the graft was noted, but it was quite sluggish. There was a narrowing at the venous anastomosis. There was a narrowing at the apex of the graft. These were treated with 6 mm balloon inflations to 14 atmospheres for 1 minute. Followup angiography demonstrates resolution of these 2 lesions in the venous portion. They were separate and distinct, but there was  still very poor forward flow. Therefore, it was elected to take a Kumpe catheter up into the common femoral artery and out through the retrograde  sheath. Hand injection of contrast at this level demonstrates patency of the common femoral and profunda femoris. There was occlusion of the first 3 cm of the SFA. The  anastomosis was patent, and there was actually retrograde filling from the profunda through collaterals to the SFA, retrograde up the SFA, and into the graft.   Since the wire was crossed, this lesion in the SFA the 6 mm balloon was advanced and  3 separate angioplasties are performed. Followup angiography from the level of the common femoral demonstrates the SFA is now widely patent in its proximal portion, and there is now rapid flow of contrast through the graft with a more normal filling of the venous system. There is also now forward flow through the SFA down to the popliteal.   Pursestring sutures of 4-0 Monocryl were placed around each sheath. Sheaths are pulled and light pressure was held. There were no immediate complications.   INTERPRETATION: Initial views demonstrated thrombus within the graft. Thrombus was cleared with the Trerotola device. The central veins, including common femoral, iliac, and inferior vena cava appear widely patent. The graft is quite small. It measures approximately  5 mm and does appear to be a smaller than a 6 mm balloon, which is quite unusual. The 2  venous sites were angioplastied, with an excellent result and less than 5% residual stenosis. Ultimately it was determined there is an occlusion of the SFA in its proximal several centimeters, and this was treated with balloon angioplasty as well, and there was, at the conclusion, now forward flow through the SFA as well forward flow in the graft itself.   SUMMARY: Successful salvage of the AV thigh graft, with treatment of an inflow stenosis in the superficial femoral artery as described above.    ____________________________ Renford Dills, MD ggs:dm D: 04/02/2013 11:00:00 ET T: 04/02/2013 12:04:29 ET JOB#: [id number]  cc: Renford Dills, MD, <Dictator> Blenda Nicely, DO Renford Dills MD ELECTRONICALLY SIGNED 04/03/2013 11:04

## 2015-02-20 NOTE — H&P (Signed)
PATIENT NAME:  Erin [name], Erin [name] MR#:  [id number] DATE OF BIRTH:  [birth year]  DATE OF ADMISSION:  04/29/2013  PRIMARY CARE PHYSICIAN:  Claretta Fraise, MD  CHIEF COMPLAINT: Altered mental status.   HISTORY OF PRESENT ILLNESS:  [name] is a 44 year old female with past medical history of panhypopituitarism, end-stage renal disease on hemodialysis Tuesday, Thursday and Saturday schedule, morbid obesity, chronic pain on multiple narcotic and sedative medications and history of bipolar disorder who is brought to the Emergency Department for severe generalized weakness. The patient states by mistake took 6 extra 2 mg of Dilaudid. When the patient arrived to the Emergency Department, the patient was quite somnolent, hypotensive. The patient was given 1 liter of fluid bolus as well as given 1 dose of stress dose steroids. The patient received 1 dose of Narcan with significant improvement in mental status. However continues to be drowsy. Work-up in the Emergency Department with chest x-ray was consistent with pneumonia. The patient received vanc and Zosyn in the Emergency Department. The patient states has been having cough with productive sputum. It was difficult to obtain any history from the patient. However, stated that the patient did not have any suicidal ideation. The patient states she took the medication secondary to pain.   PAST MEDICAL HISTORY: 1.  Panpituitarism.   2.  End-stage renal disease on hemodialysis Tuesday, Thursday and Saturday schedule through hemodialysis access on the left thigh.  3.  Morbid obesity.  4.  Gastroesophageal reflux disease. 5.  Bipolar disorder.  6.  Asthma.  7.  Hyponatremia.  8.  Osteoporosis.  9.  Seizures.  10.  Epilepsy.  11.  Previous DVT.  12.  Osteoarthritis.  13.  Fibromyalgia.  14.  Panhypopituitarism.  15.  Legal blindness.  16.  Migraine headaches.  17.  Diabetes insipidus.   PAST SURGICAL HISTORY: 1.  Status post craniotomy for pituitary  tumor. 2.  Lumpectomy.  3.  Cholecystectomy.   ALLERGIES: THE PATIENT HAS MULTIPLE ALLERGIES: 1.  DIHYDROERGOTAMINE. 2.  FLOXIN. 3.  NUBAIN. 4.  STADOL.  5.  ERYTHROMYCIN. 6.  NSAIDs. 7.  LYRICA. 8.  BACTRIM.   9.  VERAPAMIL.  10.  DEMEROL.  11.  CRESTOR.   HOME MEDICATIONS: 1.  Zofran 4 mg 2 tablets every 8 hours as needed.  2.  Topiramate 200 mg 2 times a day.  3.  Risperdal 2 mg 2 tablets once a day.  4.  ProAir 2 puffs every 4 to 6 to 8 hours as needed.  5.  OxyContin 60 mg 3 times a day.  6.  Mirtazapine 45 mg once a day at bedtime.  7.  Lovastatin 20 mg once a day.  8.  Synthroid 125 mcg once a day.  9.  Lamictal 100 mg 2 times a day.  10.  Hydromorphone 8 mg tablets every 4 hours as needed.  11.  Hydrocortisone 10 mg once a day.  12.  Lasix 80 mg 2 times a day.  13.  Fludrocortisone 0.1 mg 1 tablet once a day.  14.  Fenofibrate 145 mg 1 tablet once a day.  15.  Flexeril 10 mg 3 times a day.  16.  Clonazepam 2 mg 3 times a day.  17.  Albuterol every 2 to 4 hours as needed.   SOCIAL HISTORY: No history of smoking, drinking, alcohol or using illicit drugs. The patient states lives with her family.   FAMILY HISTORY: Positive for bipolar disorder and cancer.   REVIEW OF SYSTEMS: The  patient has been falling asleep frequently.  Unable to answer all the review of systems, however, answers some of the simple questions.  Denied having any shortness of breath currently. Denies having any abdominal pain. Uncertain about the patient's baseline functional status.   PHYSICAL EXAMINATION: GENERAL: This is a well-built, well-nourished morbidly obese female lying down in the bed, quite somnolent, arousable, however frequently falls asleep, occasionally illogical speech.  VITAL SIGNS: Temperature 98.5, pulse 81, blood pressure 125/65, respiratory rate 16 and oxygen saturation 95% on 3 liters of oxygen.  HEENT: Head normocephalic, atraumatic. There is no sclerae icterus. Conjunctivae  normal. Pupils equal and react to light. Mucous membranes dry. No pharyngeal erythema.  NECK: Supple. No lymphadenopathy. No JVD. No carotid bruit.  CHEST: Has no focal tenderness. Has bilateral rhonchi. Decreased breath sounds, especially in the lower lobes.  HEART: S1 and S2, regular, tachycardia. No pedal edema. Pulses 2+.  ABDOMEN: Obese. Bowel sounds present. Soft, nontender and nondistended. I could not palpate hepatosplenomegaly.  SKIN: No rash or lesions.  MUSCULOSKELETAL:  Could not examine. NEUROLOGIC: The patient is somnolent, however, oriented to self and place. Could not examine the motor and sensory as the patient was not cooperative.   LABORATORY AND DIAGNOSTICS: CMP: BUN 25, creatinine 4.40, potassium 5.5. Troponin less than 0.02. TSH less than 0.010. UA negative for nitrites and leukocyte esterase. Urine drug screen is positive for opiates.   CT head without contrast: Stable encephalomalacia in the right frontal lobe. Chest x-ray, portable, 1 view, shows mild pulmonary opacities bilateral, right greater than the left. ABG: PH of 7.41, pCO2 of 45 and PaO2 of 54.   CBC: WBC of 28,000, hemoglobin 10.3 and platelet count of 257.   ASSESSMENT AND PLAN: [name] is a 44 year old female on multiple sedative medications, took overdose of 6 extra pills of 2 mg of Dilaudid. Had better response with the Narcan with the altered mental status, however, continues to be quite somnolent.  1.  Altered mental status from overdose as well as possible underlying pneumonia as well. The patient was also hypotensive at the time of the presentation. Will hold all sedative medications until the patient becomes better oriented.  2.  Pneumonia.  There is a possibility of aspiration pneumonia as well as healthcare-associated pneumonia, especially considering the patient being on steroids.  Will keep the patient on vanco and Zosyn for now. Blood cultures were obtained in the Emergency Department. The  patient has low PaO2 of 54. Continue to provide oxygen.  3.  Chronic pain.  The patient is on very high doses of pain medications. would strongly consider weaning down on the medications as an outpatient by the primary care physician. However, will hold all sedative medications for now.  4.  End-stage renal disease, on hemodialysis Tuesday, Thursday and Saturday schedule. We will consult nephrology. The patient does not seem to be fluid overloaded at this time; however, the patient has potassium of 5.5. We will repeat the potassium after the breathing treatments and follow up.  5.  Hyperthyroidism.  The patient has a TSH of less than 0.010. Very highly concerning about the patient's appropriate use of the medications. However, will decrease the dose of the Synthroid to 100 mcg and follow up with TSH in 6 to 8 weeks.  6.  Legal blindness. We will need to ensure the patient's social situation and medication use. The patient states her mother usually gives her the medication. However, unable to answer why the patient took the medications  by herself.  7.  Morbid obesity. Counseled regarding diet and exercise. The patient seems at this time poor insight could be from the altered mental status.  8.  Keep the patient on deep vein thrombosis prophylaxis with heparin.   TIME SPENT: 60 minutes.  ____________________________ Susa Griffins, MD pv:sb D: 04/29/2013 08:16:18 ET T: 04/29/2013 09:45:58 ET JOB#: [id number]  cc: Susa Griffins, MD, <Dictator> Claretta Fraise, MD Susa Griffins MD ELECTRONICALLY SIGNED 05/08/2013 21:59

## 2015-02-20 NOTE — H&P (Signed)
PATIENT NAME:  Erin [name], Erin [name] MR#:  [id number] DATE OF BIRTH:  1971/02/08  DATE OF ADMISSION:  07/04/2013  PRIMARY CARE PHYSICIAN: Claretta Fraise, MD REFERRING PHYSICIAN: Minna Antis, MD  CHIEF COMPLAINT: Fall, missed hemodialysis.   HISTORY OF PRESENT ILLNESS: This is a 44 year old female with past medical history of panhypopituitarism, end-stage renal disease on hemodialysis on Tuesday, Thursday and Saturday schedule, chronic pains, multiple narcotics and sedative medications, history of seizure and bipolar disorder.   She missed her hemodialysis on Tuesday because she did not have transportation, and yesterday in the evening she fell down and she had pain in her right foot since then, and so today she did not go to her hemodialysis and started feeling a little short of breath, so came to the Emergency Room instead of going to her dialysis.   She denies any cough or any fever, and she has pain on her foot after the fall. In the ER, she was found having some fracture on her metatarsal bone and so she is being admitted for further management of her hemodialysis and her fracture issues.   REVIEW OF SYSTEMS: CONSTITUTIONAL: Negative for fever, fatigue, weight loss, pain.  EYES: No blurring or double vision, pain or redness.  ENT: No tinnitus, ear pain or hearing loss.  RESPIRATORY: No cough, wheezing, hemoptysis or dyspnea.  CARDIOVASCULAR: No chest pain, orthopnea, edema or arrhythmia.  GASTROINTESTINAL: No nausea, vomiting, diarrhea or abdominal pain.  GENITOURINARY: No dysuria, hematuria or increased frequency of urination.  ENDOCRINE: No increased sweating. No heat or cold intolerance.  SKIN: No acne or rashes or lesions.  MUSCULOSKELETAL: Heel pain in right foot.  NEUROLOGICAL: No numbness, weakness, or tremors.  PSYCHIATRIC: No anxiety, insomnia or bipolar disorder   PAST MEDICAL HISTORY:  1.  Panhypopituitarism.  2.  End-stage renal disease on hemodialysis, Tuesday,  Thursday, Saturday.  3.  Morbid obesity.  4.  Gastroesophageal reflux disease.  5.  Bipolar disorder.  6.  Asthma.  7.  Hyponatremia.  8.  Osteoporosis.  9.  Seizure.  10.  Previous DVT.  11.  Osteoarthritis.  12.  Fibromyalgia.  13.  Legal blindness.  14.  Migraine headaches.  15.  Diabetes insipidus in the past, but since kidney has failed and she is on dialysis, she got cured of that.   PAST SURGICAL HISTORY:  1.  Status post craniectomy for pituitary tumor.  2.  Lumpectomy.  3.  Cholecystectomy.   SOCIAL HISTORY: She smokes 2 packs cigarettes per day for 25 years. Denies any illicit drug use or alcohol. Lives with her family.   FAMILY HISTORY: Positive for bipolar disorder and cancer.   HOME MEDICATIONS:  Confirmed by pharmacist.  1.  Zofran 4 mg oral tablet 2 tablets every eight hours as needed.  2.  Vitamin D3 50,000 international units once a week.  3.  TriCor 145 mg once a day.  4.  Topiramate 250 mg oral tablet 2 times a day.  5.  Synthroid 125 mcg one tablet once a day.  6.  Symbicort 1 puff two times a day.  7.  Risperdal 2 mg tablet. Take 2 tablets orally once a day at bedtime.  8.  ProAir HFA 2 puffs every four to 6 hours as needed for shortness of breath.  9.  Mirtazapine 45 mg oral tablet 1 tablet once a day.  10.  Lovastatin 20 mg oral tablet once a day.  11.  Lasix 80 mg oral tablet one tablet 2 times a  day.  12.  Lamictal 200 mg two times a day.  13.  Hydromorphone 8 mg oral tablet. Take 2 tablets every 4 hours as needed for pain.  14.  Hydrocortisone 15 mg orally and in the morning and 7.5 mg in the evening.  15.  Fludrocortisone 0.1 mg, take 1 tablet once a day.  16.  Docusate sodium 1 capsule two times a day.  17.  Cyclobenzaprine 10 mg three times a day for muscle spasm.  18.  Clonazepam 2 mg oral tablet 1 tablet orally as needed for nervousness and 2 tablets at bedtime.  19.  Calcium acetate 667 mg. Take 8 capsules with meals and snacks.   PHYSICAL  EXAMINATION:  VITAL SIGNS: In ER, temperature 98.2, pulse 67, respirations 18, and blood pressure 105/62, and pulse oximetry 95% on room air.  GENERAL: Fully alert and oriented to time, place and person. She is cooperative with history taking and physical examination.  HEENT: Head and neck atraumatic. Conjunctivae pink. Oral mucosa moist.  NECK: Supple. No JVD.  RESPIRATORY: Bilateral equal air entry. Few crepitations present. No wheezing.  CARDIOVASCULAR: S1, S2 present. Murmur present. No tenderness to chest.  ABDOMEN: Soft, nontender. Bowel sounds present. No organomegaly. Morbidly obese.  SKIN: No rashes. Legs: Right leg, elastic bandage present. Joints: No swelling or tenderness.  NEUROLOGICAL: Power 4/5, legally blind, moving all limbs, and having some tremors.   LABORATORY AND DIAGNOSTICS: Glucose 78, BUN 84, creatinine 5.39, sodium 127, potassium 4.0, chloride 92, CO2 25, calcium 7.9, total protein 6.3, albumin 3.0, bilirubin 0.6, alkaline phosphatase 56, SGOT 32, SGPT 18. Troponin less than 0.02. INR 1.2. ABG shows pH 7.4, pCO2 of 40 and pO2 is 69 at 71% FiO2. Urinalysis is grossly negative. CT of the head without contrast is done which shows no acute intracranial process. X-ray of the limbs are done, and as per ER physician, there is fracture of coccyx and fracture of 4th metatarsal bone on right side foot.   ASSESSMENT AND PLAN: A 44 year old female with multiple past medical history came to hospital because of missed hemodialysis and fall having fracture on the foot.  1.  End-stage renal disease. Will call nephrology consult for hemodialysis and she might get hemodialysis tomorrow. Currently, she is not in acute distress and electrolytes are stable except hyponatremia.  2.  Fall and fracture. We will call orthopedic consult and we will call physical therapy and rehab evaluation.  3.  Hyponatremia. Will put her on fluid restriction.  4.  Hypopituitarism. We will continue replacement with  fludrocortisone and hydrocortisone and  TSH.  5.  Seizure disorder. We will continue home medication.  6.  Chronic pain syndrome and narcotic-seeking behavior. Will give her Dilaudid IV over here as she has fracture on the foot also. She is on a very, very high dose on oral, but I do not want to give that high dose of medication over here.   TOTAL TIME SPENT: 50 minutes.   CODE STATUS: FULL CODE.   ____________________________ Hope Pigeon Elisabeth Pigeon, MD vgv:np D: 07/04/2013 19:39:52 ET T: 07/04/2013 21:50:59 ET JOB#: [id number]  cc: Hope Pigeon. Elisabeth Pigeon, MD, <Dictator> Altamese Dilling MD ELECTRONICALLY SIGNED 07/06/2013 15:44

## 2015-02-20 NOTE — Op Note (Signed)
DATE OF BIRTH:  06/07/1971  DATE OF PROCEDURE:  11/06/2012  PREOPERATIVE DIAGNOSES:  1.  Complication of dialysis device with lack of flow via arterial lumen of tunneled catheter.  2.  End-stage renal disease requiring hemodialysis.  3.  History of multiple failed dialysis access.   POSTOPERATIVE DIAGNOSES:  1.  Complication of dialysis device with lack of flow via arterial lumen of tunneled catheter.  2.  End-stage renal disease requiring hemodialysis.  3.  History of multiple failed dialysis access.   PROCEDURE PERFORMED:  Exchange of cuffed tunneled dialysis catheter, same venous access, with fluoroscopic guidance.   SURGEON:  Renford Dills, MD    SEDATION:   The patient was initiated on a Precedex drip. Continuous ECG, pulse oximetry and cardiopulmonary monitoring was performed throughout the entire procedure by the interventional radiology nurse. Total sedation time was approximately 40 minutes.   ACCESS:  Right IJ.   FLUOROSCOPY TIME:  2.5 minutes.   CONTRAST USED:  Zero.   INDICATIONS:  The patient is a 44 year old woman, who has been having increasing problems at dialysis with poor flows in her catheter. She recently was found to have a total occlusion of the arterial lumen and is therefore being sent for exchange. The risks and benefits were reviewed. The patient has agreed to proceed.   DESCRIPTION OF PROCEDURE:  The patient was brought to special procedures after a Precedex drip was initiated. She was positioned on the table supine, and the right neck and chest wall were prepped and draped in a sterile fashion. One percent lidocaine was infiltrated in the soft tissues surrounding the palpable cuff and the exit site of the catheter. An 11 blade was used to extend the exit site slightly, and a hemostat was used to free the cuff from the surrounding tissues. The catheter was then withdrawn to expose the cuff. It was then transected above the cuff, and an Amplatz super stiff  wire was advanced under fluoroscopic guidance. The distal portion of the catheter was removed, and a Palindrome catheter was then opened onto the field. It was 19 cm tip to cuff. It was advanced over the Amplatz wire and into the venous circulation. It was then positioned, so that the tip was at the mid atrium level and secured to the skin of the chest with 0 silk. It aspirated easily and flushed well and was packed with 5000 units of heparin per lumen. Pursestring suture of 4-0 Monocryl was placed at the exit site. Sterile dressing was applied. The patient tolerated the procedure well, and there were no immediate complications.    ____________________________ Renford Dills, MD ggs:ms D: 11/06/2012 17:17:18 ET T: 11/06/2012 19:39:16 ET JOB#: [id number]  cc: Renford Dills, MD, <Dictator> [facility] Blenda Nicely, DO Renford Dills MD ELECTRONICALLY SIGNED 11/09/2012 7:51

## 2015-02-20 NOTE — Discharge Summary (Signed)
PATIENT NAME:  Erin [name], Erin [name] MR#:  [id number] DATE OF BIRTH:  October 22, 1971  DATE OF ADMISSION:  07/04/2013 DATE OF DISCHARGE:  07/07/2013  ADDENDUM   2.  Hyponatremia.  The patient's sodium 129, 127 on admission.  Hyponatremia thought to be secondary to fluid overload with missed hemodialysis. The patient received dialysis on September 4 and September 6 and sodium actually got better to 133.  Advised to have fluid restriction up to 1.2 liters and check repeat sodium as indicated by her primary doctor and nephrologist.  2.  History of hypopituitarism and she is on supplement with the hydrocortisone,synthyroid and florinef 3.  Depression.  4.  Fibromyalgia.  5.  History of migraines. The patient has chronic pain syndrome and will continue all the medication that she is on.   6.  History of chronic obstructive pulmonary disease. The patient did not have any wheezing. She is on Symbicort and ProAir, continue that.  7.  Advised to cut down on the pain medications, as she is on high-dose pain medications.   TIME SPENT:  More than 30 minutes.   ____________________________ Katha Hamming, MD sk:cc D: 07/07/2013 22:10:53 ET T: 07/07/2013 22:58:06 ET JOB#: [id number]  cc: Katha Hamming, MD, <Dictator>    Katha Hamming MD ELECTRONICALLY SIGNED 07/21/2013 16:05

## 2015-02-20 NOTE — Discharge Summary (Signed)
PATIENT NAME:  Erin [name], Erin [name] MR#:  [id number] DATE OF BIRTH:  [birth year]  DATE OF ADMISSION:  04/29/2013 DATE OF DISCHARGE:    PRIMARY CARE PROVIDER: Claretta Fraise, DO   DISCHARGE DIAGNOSES: 1.  Acute respiratory failure.  2.  Dilaudid overdose.  3.  Acute encephalopathy.  4.  Aspiration pneumonia.  5.  End-stage renal disease.  6.  Bipolar disorder.  7.  Panhypopituitarism.  8.  Chronic hypotension.   CONSULTANTS: Dr. Thedore Mins of nephrology.   IMAGING STUDIES DONE: Include a CT scan of the head without contrast, which showed stable encephalomalacia of the right frontal lobe. No acute stroke or hemorrhage.   Chest x-ray initially showed some interstitial edema which improved with dialysis on repeat chest x-ray.   ADMITTING HISTORY AND PHYSICAL: Please see detailed H and P dictated by Dr. Heron Nay on 04/29/2013. In brief, a 44 year old female patient with past history of panhypopituitarism, end-stage renal disease on hemodialysis, obesity, chronic pain syndrome on narcotics, who presented to the hospital with altered mental status. The patient's encephalopathy resolved after she got a dose of Narcan. The patient took extra Dilaudid pills secondary to her chronic back pain. She did not have any suicidal ideation and no prior suicide attempts. The patient was admitted to the hospitalist service as she continued to be drowsy and chest x-ray showed findings being consistent with pneumonia.   HOSPITAL COURSE: 1.  Aspiration pneumonia. The patient was treated for aspiration pneumonia during the hospital secondary to her drowsiness, vomiting and pneumonia on the chest x-ray. She did have acute respiratory failure at the time of admission and then by the day of discharge, the patient is saturating 96% on room air. Her antibiotics are being switched to Augmentin. She was covered for healthcare acquired pneumonia with vancomycin and Zosyn initially with no MRSA on cultures. The patient's vancomycin  has been stopped.  3.  Chronic pain syndrome and narcotics. The patient has been counseled to reduce pain medications and to use them only as needed and only as prescribed. I have decreased her Dilaudid significantly. The patient was on high-dose Dilaudid of 8 mg every four hours. This has been reduced 4 mg every six hours. I discussed with her mother regarding the discharge plan.  4.  End-stage renal disease, stable.  Dialysis continued in hospital.  5.  Leukocytosis. The patient was started on stress dose steroids considering her are renal insufficiency, panhypopituitarism at the time of her admission. This cause significant leukocytosis, which is slowly trending down as her pneumonia gets better.   Today the patient's lungs do not show any crackles or wheezing. No edema on lower extremities and the patient is being discharged back home in fair condition.   DISCHARGE MEDICATIONS: 1.  Zofran 4 mg oral every eight hours as needed for nausea or vomiting.  2.  Lamictal 100 mg oral 2 times a day.  3.  Levothyroxine 155m g once a day.  4.  Remeron 45 mg oral once a day.  5.  ProAir HFA 2 puffs inhaled every six hours as needed.  6.  Flexeril 10 mg oral 3 times a day as needed.  7.  Lovastatin 20 mg oral once a day.  8.  OxyContin 60 mg oral 3 times a day.  9.  Risperdal 2 mg, 2 tablets orally once a day at bedtime.  10.  Hydrocortisone 10 mg oral once a day.  11.  Celexa 40 mg oral once a day.  12.  Topiramate 250 mg  oral 2 times a day.  13.  Fludrocortisone 0.1 mg 2 tablets oral once a day.  14.  Hydromorphone 4 mg oral every six hours as needed for pain.  15.  Clonazepam 2 mg oral 3 times a day as needed for anxiety.  16.  Augmentin 875, 1 tablet oral 2 times a day for five more days.   DISCHARGE INSTRUCTIONS: The patient will be on a renal diet. Continue dialysis as before and she is Tuesday, Thursday, Saturday. Follow up with primary care physician within a week. This plan was discussed with  the patient and her mother over the phone.   TIME SPENT:   On discharge activity on day of discharge was 42 minutes.    ____________________________ Molinda Bailiff Davius Goudeau, MD srs:cc D: 05/01/2013 14:00:24 ET T: 05/01/2013 14:36:13 ET JOB#: [id number]  cc: Wardell Heath R. Caspian Deleonardis, MD, <Dictator> [address] [place], DO  Wardell Heath [place] MD ELECTRONICALLY SIGNED 05/03/2013 13:09

## 2015-02-20 NOTE — Discharge Summary (Signed)
PATIENT NAME:  Erin [name], Erin [name] MR#:  [id number] DATE OF BIRTH:  [birth year]  DATE OF ADMISSION:  01/12/2013 DATE OF DISCHARGE:  01/13/2013  For a detailed note, please take a look at the history and physical done on admission by Dr. Imogene Burn.   DISCHARGE DIAGNOSES: 1.  Acute diarrhea, likely viral in nature.  2.  Abdominal pain, likely related to diarrhea, much improved. 3.  Panhypopituitarism. 4.  Hypothyroidism. 5.  Chronic pain syndrome. 6.  Fibromyalgia. 7.  End-stage renal disease, on hemodialysis. 8.  Hyperlipidemia. 9.  Bipolar disorder.   DIET: The patient is being discharged on a low sodium, low fat diet.   ACTIVITY: As tolerated.   DISCHARGE FOLLOWUP:  Is in the next 1 to 2 weeks with Dr. Williemae Natter.  DISCHARGE MEDICATIONS:  1.  Zofran 4 mg 2 tabs q. 8 hours as needed. 2.  Lamictal 100 mg b.i.d. 3.  Synthroid 125 mcg daily. 4.  Dilaudid 4 mg 2 tabs q. 4 hours as needed. 5.  Remeron 45 mg at bedtime. 6.  Fludrocortisone 0.1 mg daily. 7.  Lasix 80 mg b.i.d.  8.  Fenofibrate 145 mg daily. 9.  Klonopin 2 mg t.i.d. 10.  Albuterol inhaler 2 puffs q.i.d. as needed. 11.  Flexeril 10 mg t.i.d. as needed. 12.  Lovastatin 20 mg daily. 13.  OxyContin 60 mg t.i.d. 14.  Risperdal 2 mg 2 tabs at bedtime. 15.  Symbicort 2 puffs b.i.d.  16.  Vitamin D2 50,000 international units weekly on Sundays.  17.  Sertraline 100 mg daily. 18.  Hydrocortisone 5 mg daily at bedtime and 10 mg daily in the morning. 19.  Topamax 250 mg b.i.d.   PERTINENT STUDIES: During the hospital course, a CT scan of the abdomen and pelvis done without contrast on admission showing no acute findings of the abdomen and pelvis. A chest x-ray done on admission showing mild bibasilar interstitial opacities.   Stool for Clostridium difficile noted to be negative.   BRIEF HOSPITAL COURSE: This is a 44 year old female with medical problems as mentioned above who presented to the hospital with abdominal pain and  diarrhea.  1.  Acute diarrhea. The patient presented initially to the hospital with abdominal pain and underwent a CT scan of the abdomen and pelvis. Shortly after the CT scan she developed diarrhea. The diarrhea was likely viral in nature. She had stool checked for WBCs and C. diff, which were negative.  Her diarrhea had actually improved overnight in the hospital. She was treated supportively with IV fluids, antiemetics, and also with antidiarrheals. Her diet was slowly advanced from a clear liquid eventually to a regular diet, which she has been able to tolerate without any worsening of her diarrhea.  2.  Abdominal pain. The exact etiology of this is unclear but likely suspected to be due to underlying diarrhea. The patient required significantly high doses of narcotics to keep her pain under control, although she did not have any worsening of her abdominal pain as her diet was advanced and her diarrhea improved. A CT scan of the abdomen and pelvis, as mentioned above, without contrast, was negative.  3.  Chronic pain syndrome. The patient was maintained on her Dilaudid and OxyContin and she will resume that.  4.  Bipolar disorder. The patient was maintained on Risperdal, Lamictal and Zoloft. She will also resume that upon discharge.  5.  End-stage renal disease, on hemodialysis. The patient normally gets dialyzed on Tuesday, Thursday and Saturday. She missed her dialysis  on Saturday. I did get a nephrology consult. I discussed if she would need to get dialyzed prior to being discharged. As per them, she had no urgent indication for dialysis and she can resume her dialysis this coming Tuesday.  6.  Hyperlipidemia. The patient was maintained on her lovastatin and she will resume that.  7.  Hypothyroidism. The patient will continue her Synthroid as stated.  8.  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 She will continue her Synthroid and her fludrocortisone, as  stated. The patient is a FULL CODE.  TIME SPENT ON DISCHARGE:  35 minutes.  ____________________________ Rolly Pancake. Cherlynn Kaiser, MD vjs:sb D: 01/14/2013 07:59:03 ET T: 01/14/2013 09:20:45 ET JOB#: [id number]  cc: Rolly Pancake. Cherlynn Kaiser, MD, <Dictator> Williemae Natter, MD [place] Siren MD ELECTRONICALLY SIGNED 01/27/2013 12:27

## 2015-02-20 NOTE — H&P (Signed)
PATIENT NAME:  Erin [name], Erin [name] MR#:  [id number] DATE OF BIRTH:  [birth year]  DATE OF ADMISSION:  01/12/2013  PRIMARY CARE PHYSICIAN:  Dr. Claretta Fraise.   REFERRING PHYSICIAN:  Dr. Enedina Finner.  CHIEF COMPLAINT:  Abdominal pain one day.   HISTORY OF PRESENT ILLNESS:  The patient is a 44 year old Caucasian female with a history of ESRD, diabetes insipidus, seizure disorder, presented to the ED with abdominal pain for one day.  The patient is alert, awake, oriented, in no acute distress.  The patient is legally blind.  Then patient said that she has had abdominal pain for one day which is constant from upper abdomen to lower abdomen without radiation.  The patient denies any nausea, vomiting or diarrhea before she came here, but developed diarrhea after taking contrast for abdominal CAT scan.  Abdominal pain become cramp which is intermittent.  The patient denies any eating outside or leftover food.  No ill contact, but the patient has constipation for the past two days.  She denies any fever or chills.  No headache or dizziness.  No chest pain, palpitations.  No urine problems.    PAST MEDICAL HISTORY:  ESRD, panhypopituitarism, seizure disorder, diabetes insipidus, osteoporosis, bipolar disorder, hypothyroidism, fibromyalgia, migraine headache, history of deep vein thrombosis, chronic hyponatremia, asthma.   PAST SURGICAL HISTORY:  Status post a cranial pharyngeal myoma resection.   SOCIAL HISTORY:  Smokes 1 pack a day for 14 years.  Denies any alcohol drinking or illicit drugs.   FAMILY HISTORY:  Father has diabetes, hypertension, but no stroke or heart attack or kidney problems.   REVIEW OF SYSTEMS:  CONSTITUTIONAL:  The patient denies any fever or chills.  No headache or dizziness.  No weakness.  EYES:  The patient is blind.  CARDIOVASCULAR:  No chest pain, palpitation, orthopnea or nocturnal dyspnea.  No leg edema.  EARS, NOSE, THROAT:  No postnasal drip, slurred speech or dysphagia.   PULMONARY:  No cough, sputum, shortness of breath or hemoptysis.  GASTROINTESTINAL:  Positive for abdominal pain, but no nausea, vomiting.  Had one episode of diarrhea.  No melena or bloody stool.  GENITOURINARY:  No dysuria, hematuria or incontinence.  SKIN:  No rash or jaundice.  NEUROLOGY:  No syncope, loss of consciousness or seizure.  HEMATOLOGY:  No easy bruising or bleeding.  ENDOCRINE:  No polyuria, polydipsia, heat or cold intolerance.   ALLERGIES:  ZITHROMAX, BACITRACIN, CRESTOR, DEMEROL, DIHYDROERGOTAMINE, MESYLATE, ERYTHROMYCIN, Floxin, LYRICA, NSAIDS, NUBAIN, PHENERGAN, STADOL, VERAPAMIL.   HOME MEDICATIONS:  Please see the medication reconciliation list.   PHYSICAL EXAMINATION: VITAL SIGNS:  Temperature 97.9, blood pressure 160/72, pulse 77, respirations 18, O2 saturation 98% on room air.  GENERAL:  The patient is alert, awake, oriented, in no acute distress.  HEENT:  The patient is blind, cannot see anything.  No discharge from ear or nose.  Moist oral mucosa.  Clear oropharynx.  NECK:  Supple.  No JVD or carotid bruit.  No lymphadenopathy.  No thyromegaly.  CARDIOVASCULAR:  S1, S2, regular rate, rhythm.  No murmurs, gallops.  PULMONARY:  Bilateral air entry.  No wheezing or rales.  No use of accessory muscles to breathe.  ABDOMEN:  Soft, obese.  Bowel sounds present.  No tenderness.  No  organomegaly.  EXTREMITIES:  No edema, clubbing or cyanosis.  No calf tenderness.  Bilateral pulses present.  SKIN:  No rash or jaundice.  NEUROLOGY:  Alert and oriented x 3.  No focal deficit.  Power 5 out of 5.  Sensation intact.   LABORATORY DATA:  Abdomen, pelvis CAT scan showed no acute findings.  Chest x-ray showed mild bilateral interstitial opacity, may be related to the lower lung volumes.  Glucose 74, BUN 45, creatinine 4.14.  Sodium 135, potassium 4.9, chloride 101, bicarbonate 23.  WBC 13.9, hemoglobin 12.7, platelets 216, lipase 375.  Urinalysis showed WBC 9 and RBC 4.   Magnesium 2.4.  Phosphate 4.6.  EKG showed normal sinus rhythm at 84 beats per minute with incomplete right bundle branch block.   IMPRESSION: 1.  Abdominal pain, possible gastritis or due to constipation.  2.  Leukocytosis, possible due to steroid.  The patient is taking hydrocortisone.  3.  End-stage renal disease.  4.  Asthma.  5.  History of seizure disorder.  6.  Diabetes insipidus.  7.  Osteoporosis.  8.  Bipolar disorder.  9.  Hypothyroidism.  10.  Fibromyalgia.  11.  Migraine headache.   12.  Panhypopituitarism.  PLAN OF TREATMENT: 1.  The patient will be placed for observation.  We will give full liquid diet.  Pain control with Zofran for any nausea, vomiting.  2.  We will advance diet to renal diet as tolerated.  3.  Continue the patient's home medication.  4.  GI and DVT prophylaxis.   Discussed the patient's condition and the treatment of plan with the patient, the patient's father and the husband.   TIME SPENT:  About 57 minutes.    ____________________________ Shaune Pollack, MD qc:ea D: 01/12/2013 21:51:29 ET T: 01/13/2013 06:52:34 ET JOB#: [id number]  cc: Shaune Pollack, MD, <Dictator> Shaune Pollack MD ELECTRONICALLY SIGNED 01/13/2013 15:11

## 2015-02-20 NOTE — Discharge Summary (Signed)
PATIENT NAME:  Erin [name], MOA MR#:  [id number] DATE OF BIRTH:  Dec 08, 1970  DATE OF ADMISSION:  07/04/2013  DATE OF DISCHARGE:  07/07/2013  DISCHARGE DIAGNOSES: 1. Fall, with metatarsal fractures and coccyx fracture. Seen by orthopedic. Nonoperative management advised. Patient advised to have a postop boot and weightbearing as tolerated.  2. Hyponatremia secondary to fluid overload from missing hemodialysis, resolved.  3.  End-stage renal disease on hemodialysis Tuesday, Thursday and Saturday.  4.  Panhypopituitarism.  5.  Fibromyalgia. 6.  Legal blindness.   7.  Bipolar disorder. 8.  GERD. 9.  Chronic pain syndrome.  10.   Migraines.    DISCHARGE MEDICATIONS: Mirtazapine 45 mg p.o. at bedtime, Zofran 4 mg every 8 hours as needed, Flexeril 10 mg p.o. t.i.d., ProAir 2 puffs every 4 to 6 hours for trouble breathing, Lamictal 200 mg p.o. b.i.d., Topamax 250 mg p.o. b.i.d., Synthroid 125 mcg p.o. daily, fludrocortisone 0.1 mg p.o. daily, OxyContin ER 80 mg p.o. every 8  hours,  TriCor 145 mg p.o. daily, Symbicort 160/4.5 mg p.o. b.i.d., Lasix 80 mg p.o. b.i.d., Colace 100 mg p.o. b.i.d., hydrocortisone 10 mg in the morning and 5 mg in the afternoon, Celexa 40 mg p.o. daily, Dilaudid 4 mg, 2 tablets every 4 hrs   as needed. Dilaudid and OxyContin, patient has been on those medications for a long time.  CONSULTATIONS:  Nephrology consult with Dr. Thedore Mins. (,orthopedic consult   with Dr.Tobin Eckel  discharged with postop boot, advised to use the boot for some time, and weightbearing as tolerated.   HOSPITAL COURSE: This is a 44 year old female patient with a past medical history of panhypopituitarism, ESRD on hemodialysis Tuesday, Thursday, Saturday, chronic pain on high doses of narcotics, and history of migraines and seizure disorder. Came in because of the fall and missed hemodialysis. The patient did not go for dialysis on Tuesday and Thursday, and patient missed dialysis on Tuesday of last week  because she did not have transport, and then she fell at home and did not go for dialysis on Thursday. The patient came to the ER on Thursday. Because of the fall, she had x-rays done, which showed fracture of her metacarpal bones, and she was admitted for that.   Fall and fractures. The patient's ankle x-ray on the right showed osteopenia without any fractures, but the foot x-ray showed nondisplaced distal third metatarsal and fourth metatarsal fractures. The patient's back and coccyx x-ray also done, which showed a coccygeal subluxation anteriorly and possible fracture in the region of the sacral segment. CT head unremarkable. The patient was seen by Dr. Thornton Papas who recommended non-surgical intervention and recommended postop boot to stabilize the ankle and foot. For coccyx fracture, conservative managementn. For metacarpal fractures, patient can be transferred into postoperative stiff-soled shoe or Cam boot  and weightbearing as tolerated. The same was explained to her, and wrote a prescription for Cam boot. The patient is already on high-dose pain medication. She is using that. She did not express any concerns.    ____________________________ Katha Hamming, MD sk:mr D: 07/07/2013 21:33:00 ET T: 07/07/2013 22:49:39 ET JOB#: [id number]  cc: Katha Hamming, MD, <Dictator> Katha Hamming MD ELECTRONICALLY SIGNED 07/21/2013 16:12

## 2015-02-20 NOTE — Consult Note (Signed)
Brief Consult Note: Diagnosis: Coccyx fracture; right 4th metatarsal neck fracture.   Patient was seen by consultant.   Consult note dictated.   Comments: Nonoperative management for orthopaedic injuries. She can be transitioned to a post-op shoe or CAM boot for her RLE. She will be able to progress her WBAT to RLE, but will likely require an assistive device for the next 1-2 weeks.  Electronic Signatures: Danelle Earthly (MD)  (Signed 06-Sep-14 14:04)  Authored: Brief Consult Note   Last Updated: 06-Sep-14 14:04 by Danelle Earthly (MD)

## 2015-02-20 NOTE — Consult Note (Signed)
PATIENT NAME:  GARNIE, DERICO MR#:  [id number] DATE OF BIRTH:  [birth year]  DATE OF CONSULTATION:  07/06/2013  CONSULTING PHYSICIAN:  Danelle Earthly, MD  HISTORY OF PRESENT ILLNESS: Ms. Wallenberg is a 44 year old female with multiple medical problems to include end-stage renal disease on hemodialysis, asthma, bipolar disorder, history of deep vein thrombosis, fibromyalgia, panhypopituitarism, seizure disorder, hyponatremia and GERD. She fell approximately 2 days ago stating she slipped and fell on her back side with immediate pain over her coccyx region as well as her right forefoot.   PAST MEDICAL HISTORY: As stated above.   PAST SURGICAL HISTORY: Includes previous right breast lumpectomy and previous cholecystectomy as well as multiple procedures for vascular access for her hemodialysis.   SOCIAL HISTORY: Positive for tobacco.   PHYSICAL EXAMINATION: The patient is alert and oriented, in no acute distress at the bedside. She does have a splint in place on her right lower extremity. Upon physical examination of the dorsum of her foot, she has swelling and ecchymosis over her distal metatarsals with tenderness to palpation along the distal aspect of the fourth metatarsal. She is also tender to palpation overlying her distal sacrum.   RADIOGRAPHS: X-rays taken demonstrate a right fourth metatarsal neck fracture with minimal displacement as well as what appears to be a coccyx fracture on her lateral sacral view.   ASSESSMENT: A 44 year old female with right fourth metatarsal fracture and coccyx fracture. I explained to her that while it is painful to have a coccyx fracture it very rarely ever requires any operative intervention and she will be treated nonoperatively for that injury. As far as her metatarsal fracture, this too represents a nonoperative injury. She can be transitioned into a postoperative stiff-soled shoe or a CAM boot and can begin to weight bear as tolerated; however, will likely  require a brief period of nonweightbearing given her size and likely the pain she will experience with increased ambulation.  I recommend that she weightbear  as tolerated in the cam boot or postoperative shoe with a walker for assistance with all likelihood progressing to full weight-bearing as tolerated in the shoe or boot by 2 to 3 weeks post injury.     ____________________________ Danelle Earthly, MD tte:dp D: 07/06/2013 13:58:31 ET T: 07/06/2013 14:13:58 ET JOB#: [id number]  cc: Danelle Earthly, MD, <Dictator> Danelle Earthly MD ELECTRONICALLY SIGNED 07/07/2013 10:54

## 2015-02-22 NOTE — Op Note (Signed)
PATIENT NAME:  Erin [name], Erin [name] MR#:  [id number] DATE OF BIRTH:  Dec 19, 1970  DATE OF PROCEDURE:  03/06/2012  PREOPERATIVE DIAGNOSIS: Complication of AV dialysis device with lack of flow via right internal jugular cuffed tunneled dialysis catheter.   POSTOPERATIVE DIAGNOSIS: Complication of AV dialysis device with lack of flow via right internal jugular cuffed tunneled dialysis catheter.   PROCEDURE PERFORMED: Exchange of right tunneled dialysis catheter, same venous access.   SURGEON: Levora Dredge, M.D.   SEDATION: Precedex drip with 50 mcg of fentanyl administered IV. Continuous ECG, pulse oximetry and cardiopulmonary monitoring was performed throughout the entire procedure by the interventional radiology nurse. Total sedation time was 45 minutes.   ACCESS: Right IJ venous access site.   CONTRAST USED: None.   FLUORO TIME: 0.3 minutes.   INDICATIONS: Erin [name] is a 44 year old woman who presented with poor flows and essentially lack of flow via her catheter. She is therefore undergoing exchange of her catheter. The risks and benefits were reviewed, all questions were answered, and the patient agrees to proceed.   DESCRIPTION OF PROCEDURE: The patient is taken to special procedures and placed in the supine position. After adequate sedation on the Precedex drip has been achieved, her right neck and chest wall are prepped and draped in sterile fashion. Erin [name] is placed down over the entire surface. 1% lidocaine is infiltrated in the soft tissues near the base of the neck and a small transverse incision is created and the dissection is carried down to expose the catheter. The catheter is then delivered into the field, transected, and an Amplatz superstiff wire is advanced under fluoroscopic guidance into the inferior vena cava. The intravascular portion of the catheter is removed. The catheter is also then retracted more toward the neck and transected and then allowed to retract back  toward the hub to create an area that can be oversewn to eliminate a tunnel from the old exit site to the new catheter. Later in the procedure this tunnel is oversewn with 3-0 Vicryl. An exit site is selected and anesthetized with 1% lidocaine, a small transverse incision is created, and a 19 cm tip to cuff Dura Max catheter is pulled subcutaneously. The wire is then back thread through the catheter and the catheter is then fed over the wire into the venous system. Under fluoroscopy the wire is removed and the tips are positioned in the mid atrium level. The catheter aspirates well and flushes easily. It is secured to the skin of the chest wall with 0 silk and 5000 units of heparin per lumen is then instilled. Up at the counterincision, at the base of the neck, as described above, 3-0 Vicryl is used to close the tunnel from the old site to this new catheter and then the incision itself is closed with 4-0 Monocryl subcuticular and then Dermabond. Once the Dermabond has dried, the Erin [name] is removed, and the old hub is removed without difficulty. Light pressure is held at the exit site and antibiotic ointment is placed and a Band-Aid. The patient tolerated the procedure well and there were no immediate complications.  ____________________________ Renford Dills, MD ggs:slb D: 03/06/2012 17:03:18 ET T: 03/07/2012 09:46:56 ET JOB#: [id number]  cc: Renford Dills, MD, <Dictator> Blenda Nicely, DO Renford Dills MD ELECTRONICALLY SIGNED 03/09/2012 7:53

## 2015-02-22 NOTE — Op Note (Signed)
PATIENT NAME:  Erin [name], Erin [name] MR#:  [id number] DATE OF BIRTH:  1971-09-19  DATE OF PROCEDURE:  03/12/2012  PREOPERATIVE DIAGNOSES:  1. End-stage renal disease.  2. Nonfunctional right jugular PermCath.  3. Bipolar disorder.  4. Obesity.   POSTOPERATIVE DIAGNOSES: 1. End-stage renal disease.  2. Nonfunctional right jugular PermCath.  3. Bipolar disorder.  4. Obesity.   PROCEDURES: 1. Removal and replacement through same venous access right internal jugular PermCath.  2. Fluoroscopic guidance for placement of catheter.   SURGEON: Annice Needy, MD   ANESTHESIA: Local with moderate conscious sedation.   ESTIMATED BLOOD LOSS: Approximately 25 mL.  FLUOROSCOPY: Less than one minute.   CONTRAST USED: None.   INDICATION FOR PROCEDURE: This is a 44 year old white female with end-stage renal disease. She had a PermCath placed recently. This is reported as being poorly functioning and an exchange is requested. Risks and benefits are discussed. Informed consent is obtained.   DESCRIPTION OF PROCEDURE: The patient was brought to the Vascular Interventional Radiology Suite. Existing catheter, right neck and chest were sterilely prepped and draped and a sterile surgical field was created. The right jugular PermCath was rewired with a Magic torque wire. The area surrounding this was anesthetized copiously with 1% lidocaine. The existing catheter was removed easily with gentle traction. It had not been in long so the cuff was not well incorporated. Using fluoroscopic guidance I selected a 19 cm tip-to-cuff tunneled hemodialysis catheter using a palindrome different type of catheter in hopes that the flows would be better. This was placed over the wire without difficulty and wires removed. It was parked in the right atrium. Catheter was secured to the skin with two Prolene sutures and a single 4-0 Monocryl purse-string suture was placed around the catheter exit site. Sterile dressing was placed. The  patient tolerated the procedure well and was taken to the recovery room in stable condition.   ____________________________ Annice Needy, MD jsd:drc D: 03/12/2012 14:17:46 ET T: 03/13/2012 08:40:27 ET JOB#: [id number]  cc: Annice Needy, MD, <Dictator> Annice Needy MD ELECTRONICALLY SIGNED 03/14/2012 15:12

## 2015-03-11 ENCOUNTER — Emergency Department
Admission: EM | Admit: 2015-03-11 | Discharge: 2015-03-11 | Disposition: A | Discharge status: Home / Self Care | Payer: Medicare Other | Attending: Internal Medicine | Admitting: Internal Medicine

## 2015-03-11 ENCOUNTER — Emergency Department: Payer: Medicare Other

## 2015-03-11 ENCOUNTER — Ambulatory Visit
Admission: EM | Admit: 2015-03-11 | Discharge: 2015-03-11 | Disposition: A | Discharge status: Other Acute Inpatient Hospital | Payer: Medicare Other | Attending: Family Medicine | Admitting: Family Medicine

## 2015-03-11 ENCOUNTER — Encounter: Payer: Self-pay | Admitting: *Deleted

## 2015-03-11 DIAGNOSIS — Z720 Tobacco use: Secondary | ICD-10-CM | POA: Diagnosis not present

## 2015-03-11 DIAGNOSIS — S40021D Contusion of right upper arm, subsequent encounter: Secondary | ICD-10-CM

## 2015-03-11 DIAGNOSIS — Z7951 Long term (current) use of inhaled steroids: Secondary | ICD-10-CM | POA: Diagnosis not present

## 2015-03-11 DIAGNOSIS — W101XXD Fall (on)(from) sidewalk curb, subsequent encounter: Secondary | ICD-10-CM | POA: Insufficient documentation

## 2015-03-11 DIAGNOSIS — Z79899 Other long term (current) drug therapy: Secondary | ICD-10-CM | POA: Insufficient documentation

## 2015-03-11 DIAGNOSIS — T148 Other injury of unspecified body region: Secondary | ICD-10-CM

## 2015-03-11 DIAGNOSIS — S8002XD Contusion of left knee, subsequent encounter: Secondary | ICD-10-CM | POA: Insufficient documentation

## 2015-03-11 DIAGNOSIS — S8001XD Contusion of right knee, subsequent encounter: Secondary | ICD-10-CM | POA: Insufficient documentation

## 2015-03-11 DIAGNOSIS — S40011D Contusion of right shoulder, subsequent encounter: Secondary | ICD-10-CM | POA: Diagnosis not present

## 2015-03-11 DIAGNOSIS — N186 End stage renal disease: Secondary | ICD-10-CM | POA: Diagnosis not present

## 2015-03-11 DIAGNOSIS — Z992 Dependence on renal dialysis: Secondary | ICD-10-CM | POA: Insufficient documentation

## 2015-03-11 DIAGNOSIS — Z79891 Long term (current) use of opiate analgesic: Secondary | ICD-10-CM | POA: Diagnosis not present

## 2015-03-11 DIAGNOSIS — S8012XD Contusion of left lower leg, subsequent encounter: Secondary | ICD-10-CM

## 2015-03-11 DIAGNOSIS — T07XXXA Unspecified multiple injuries, initial encounter: Secondary | ICD-10-CM

## 2015-03-11 DIAGNOSIS — H548 Legal blindness, as defined in USA: Secondary | ICD-10-CM | POA: Diagnosis not present

## 2015-03-11 DIAGNOSIS — S8992XD Unspecified injury of left lower leg, subsequent encounter: Secondary | ICD-10-CM | POA: Diagnosis present

## 2015-03-11 DIAGNOSIS — IMO0001 Reserved for inherently not codable concepts without codable children: Secondary | ICD-10-CM

## 2015-03-11 HISTORY — DX: Epilepsy, unspecified, not intractable, without status epilepticus: G40.909

## 2015-03-11 HISTORY — DX: Unspecified asthma, uncomplicated: J45.909

## 2015-03-11 HISTORY — DX: Hyperlipidemia, unspecified: E78.5

## 2015-03-11 HISTORY — DX: Unspecified visual loss: H54.7

## 2015-03-11 HISTORY — DX: Other chronic pain: G89.29

## 2015-03-11 HISTORY — DX: Fibromyalgia: M79.7

## 2015-03-11 HISTORY — DX: Hypothyroidism, unspecified: E03.9

## 2015-03-11 HISTORY — DX: Headache: R51

## 2015-03-11 HISTORY — DX: Personal history of other venous thrombosis and embolism: Z86.718

## 2015-03-11 HISTORY — DX: Narcolepsy without cataplexy: G47.419

## 2015-03-11 HISTORY — DX: Constipation, unspecified: K59.00

## 2015-03-11 NOTE — Discharge Instructions (Signed)
Patient to go to the ER for further evaluation and treatment. May require CT given multiple sites of pain.
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
## 2015-03-11 NOTE — ED Notes (Signed)
Has bruising to right and left knee, some tenderness to right shoulder, some bruising to right toe , right lower leg

## 2015-03-11 NOTE — ED Notes (Signed)
Patient states that yesterday she fell coming out of Dialysis. She states that she fell in the parking lot and fell on her knees. Patient is having pain in both legs, both knees, right hand and fingers and right shoulder. Notable bruising in areas as well.
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
## 2015-05-06 ENCOUNTER — Emergency Department
Admission: EM | Admit: 2015-05-06 | Discharge: 2015-05-06 | Disposition: A | Discharge status: Home / Self Care | Payer: Medicare Other | Attending: Student | Admitting: Student

## 2015-05-06 ENCOUNTER — Other Ambulatory Visit: Payer: Self-pay

## 2015-05-06 ENCOUNTER — Emergency Department: Payer: Medicare Other

## 2015-05-06 ENCOUNTER — Encounter: Payer: Self-pay | Admitting: Emergency Medicine

## 2015-05-06 DIAGNOSIS — Z791 Long term (current) use of non-steroidal anti-inflammatories (NSAID): Secondary | ICD-10-CM | POA: Diagnosis not present

## 2015-05-06 DIAGNOSIS — Z992 Dependence on renal dialysis: Secondary | ICD-10-CM | POA: Insufficient documentation

## 2015-05-06 DIAGNOSIS — Z79899 Other long term (current) drug therapy: Secondary | ICD-10-CM | POA: Insufficient documentation

## 2015-05-06 DIAGNOSIS — R110 Nausea: Secondary | ICD-10-CM | POA: Insufficient documentation

## 2015-05-06 DIAGNOSIS — Z7952 Long term (current) use of systemic steroids: Secondary | ICD-10-CM | POA: Diagnosis not present

## 2015-05-06 DIAGNOSIS — Z720 Tobacco use: Secondary | ICD-10-CM | POA: Diagnosis not present

## 2015-05-06 DIAGNOSIS — R0789 Other chest pain: Secondary | ICD-10-CM | POA: Insufficient documentation

## 2015-05-06 DIAGNOSIS — N186 End stage renal disease: Secondary | ICD-10-CM | POA: Diagnosis not present

## 2015-05-06 DIAGNOSIS — R079 Chest pain, unspecified: Secondary | ICD-10-CM

## 2015-05-06 LAB — COMPREHENSIVE METABOLIC PANEL
ALK PHOS: 47 U/L (ref 38–126)
ALT: 23 U/L (ref 14–54)
AST: 49 U/L — AB (ref 15–41)
Albumin: 3.8 g/dL (ref 3.5–5.0)
Anion gap: 13 (ref 5–15)
BILIRUBIN TOTAL: 0.8 mg/dL (ref 0.3–1.2)
BUN: 35 mg/dL — ABNORMAL HIGH (ref 6–20)
CHLORIDE: 95 mmol/L — AB (ref 101–111)
CO2: 29 mmol/L (ref 22–32)
Calcium: 8.3 mg/dL — ABNORMAL LOW (ref 8.9–10.3)
Creatinine, Ser: 3.98 mg/dL — ABNORMAL HIGH (ref 0.44–1.00)
GFR calc Af Amer: 15 mL/min — ABNORMAL LOW (ref >60)
GFR, EST NON AFRICAN AMERICAN: 13 mL/min — AB (ref >60)
Glucose, Bld: 73 mg/dL (ref 65–99)
Potassium: 3.8 mmol/L (ref 3.5–5.1)
Sodium: 137 mmol/L (ref 135–145)
Total Protein: 6.9 g/dL (ref 6.5–8.1)

## 2015-05-06 LAB — CBC WITH DIFFERENTIAL/PLATELET
BASOS PCT: 1 %
Basophils Absolute: 0.1 10*3/uL (ref 0–0.1)
Eosinophils Absolute: 0 10*3/uL (ref 0–0.7)
Eosinophils Relative: 0 %
HCT: 30.7 % — ABNORMAL LOW (ref 35.0–47.0)
Hemoglobin: 10 g/dL — ABNORMAL LOW (ref 12.0–16.0)
LYMPHS PCT: 13 %
Lymphs Abs: 1.5 10*3/uL (ref 1.0–3.6)
MCH: 31.9 pg (ref 26.0–34.0)
MCHC: 32.6 g/dL (ref 32.0–36.0)
MCV: 97.9 fL (ref 80.0–100.0)
Monocytes Absolute: 0.5 10*3/uL (ref 0.2–0.9)
Monocytes Relative: 4 %
NEUTROS ABS: 9.5 10*3/uL — AB (ref 1.4–6.5)
Neutrophils Relative %: 82 %
Platelets: 174 10*3/uL (ref 150–440)
RBC: 3.14 MIL/uL — ABNORMAL LOW (ref 3.80–5.20)
RDW: 15.9 % — AB (ref 11.5–14.5)
WBC: 11.5 10*3/uL — AB (ref 3.6–11.0)

## 2015-05-06 LAB — TROPONIN I

## 2015-05-06 MED ORDER — HYDROMORPHONE HCL 1 MG/ML IJ SOLN
1.0000 mg | Freq: Once | INTRAMUSCULAR | Status: AC
  Administered 2015-05-06: 1 mg via INTRAVENOUS

## 2015-05-06 MED ORDER — HYDROMORPHONE HCL 1 MG/ML IJ SOLN
INTRAMUSCULAR | Status: AC
  Administered 2015-05-06: 1 mg via INTRAVENOUS
  Filled 2015-05-06: qty 1

## 2015-05-06 NOTE — ED Notes (Signed)
All prior discharge information was entered on incorrect pt. Disregard. Pt still active and in room 1 at this time.

## 2015-05-06 NOTE — ED Notes (Signed)
Pt reports chest pain 8/10 that began during dialysis. Pt states that she did not finish her dialysis Monday because she was cramping and also had to stop early today due to the chest pain. Pt is alert & oriented with NAD.

## 2015-05-06 NOTE — ED Notes (Signed)
Pt discharged home after verbalizing understanding of discharge instructions; nad noted.  Pt taken to lobby to await her mother's arrival for transport home.

## 2015-05-06 NOTE — ED Provider Notes (Signed)
[facility] Emergency Department Provider Note  ____________________________________________  Time seen: Approximately 3:28 PM  I have reviewed the triage vital signs and the nursing notes.   HISTORY  Chief Complaint Chest Pain    HPI Erin [name] is a 44 y.o. female with history of end-stage renal disease on dialysis, dialyzed Monday Wednesday Friday, legal blindness, chronic pain/fibromyalgia, panhypopituitarism, migraines, history of DVT, bipolar disorder, seizures who presents for evaluation of gradual onset chest pain, onset just prior to arrival. Patient reports that she was receiving dialysis when she developed cramping substernal chest pain that radiated under her left breast. The pain did not go anywhere else, not into her jaw, her arm, her back, down towards her feet. It is not ripping or tearing in nature. It is not associated with any shortness of breath, no pleuritic and not worse with movement. She's had no cough, fevers, runny nose, congestion, vomiting or diarrhea. She has been nauseated today. Current severity is mild. She reports that she is having pain "everywhere" because she has not had her scheduled dilaudid. No modifying factors.   Past Medical History  Diagnosis Date  . Fibromyalgia   . Chronic headaches     Seeing Pain Management  . Hyperlipidemia   . Psychosis   . Hypothyroid   . Narcolepsy   . Asthma   . Constipation   . Kidney failure     Currently on Dialysis  . Blind   . Epilepsy   . History of DVT (deep vein thrombosis)   . History of CVA (cerebrovascular accident)     There are no active problems to display for this patient.   Past Surgical History  Procedure Laterality Date  . Brain surgery  1976  . Cholecystectomy  2001  . Breast lumpectomy  2001  . Pituitary excision  1976  . Dg av dialysis graft declot or      X 4    Current Outpatient Rx  Name  Route  Sig  Dispense  Refill  . albuterol (PROVENTIL  HFA;VENTOLIN HFA) 108 (90 BASE) MCG/ACT inhaler   Inhalation   Inhale 2 puffs into the lungs every 6 (six) hours as needed for wheezing or shortness of breath.         . budesonide-formoterol (SYMBICORT) 80-4.5 MCG/ACT inhaler   Inhalation   Inhale 2 puffs into the lungs 2 (two) times daily.         . calcium acetate, Phos Binder, (PHOSLYRA) 667 MG/5ML SOLN   Oral   Take 667 mg by mouth 3 (three) times daily with meals.         . fenofibrate (TRICOR) 145 MG tablet   Oral   Take 145 mg by mouth daily.         . fludrocortisone (FLORINEF) 0.1 MG tablet   Oral   Take 0.1 mg by mouth daily.         Marland Kitchen FLUoxetine (PROZAC) 40 MG capsule   Oral   Take 80 mg by mouth daily.         . folic acid-vitamin b complex-vitamin c-selenium-zinc (DIALYVITE) 3 MG TABS tablet   Oral   Take 1 tablet by mouth daily.         Marland Kitchen gabapentin (NEURONTIN) 100 MG capsule   Oral   Take 400 mg by mouth at bedtime.         Marland Kitchen HYDROCORTISONE PO   Oral   Take by mouth.         Marland Kitchen  hydrocortisone sodium succinate (SOLU-CORTEF) 1000 MG injection   Intravenous   Inject into the vein once.         Marland Kitchen HYDROmorphone (DILAUDID) 4 MG tablet   Oral   Take 6 mg by mouth every 4 (four) hours as needed for severe pain.         Marland Kitchen levothyroxine (SYNTHROID, LEVOTHROID) 112 MCG tablet   Oral   Take 112 mcg by mouth daily before breakfast.         . lovastatin (MEVACOR) 40 MG tablet   Oral   Take 40 mg by mouth at bedtime.         . Melatonin 1 MG TABS   Oral   Take by mouth.         . methylphenidate (RITALIN LA) 40 MG 24 hr capsule   Oral   Take 40 mg by mouth every morning.         . Naloxone HCl 0.4 MG/0.4ML SOAJ   Injection   Inject as directed.         . OxyCODONE (OXYCONTIN) 40 mg T12A 12 hr tablet   Oral   Take 40 mg by mouth 3 (three) times daily.         . polyethylene glycol (MIRALAX / GLYCOLAX) packet   Oral   Take 17 g by mouth daily.         .  risperidone (RISPERDAL) 4 MG tablet   Oral   Take 4 mg by mouth 2 (two) times daily.         Marland Kitchen topiramate (TOPAMAX) 200 MG tablet   Oral   Take 250 mg by mouth 2 (two) times daily.           Allergies Demerol; Dhea; Erythromycin; Floxin; Nsaids; Nubain; Phenergan; and Stadol  No family history on file.  Social History History  Substance Use Topics  . Smoking status: Current Every Day Smoker -- 0.50 packs/day for 16 years    Types: Cigarettes  . Smokeless tobacco: Not on file  . Alcohol Use: 0.0 oz/week    0 Standard drinks or equivalent per week     Comment: occasionally    Review of Systems Constitutional: No fever/chills Eyes: No visual changes. ENT: No sore throat. Cardiovascular: + chest pain. Respiratory: Denies shortness of breath. Gastrointestinal: No abdominal pain.  + nausea, no vomiting.  No diarrhea.  No constipation. Genitourinary: Negative for dysuria. Musculoskeletal: Negative for back pain. Skin: Negative for rash. Neurological: Negative for headaches, focal weakness or numbness.  10-point ROS otherwise negative.  ____________________________________________   PHYSICAL EXAM:  Filed Vitals:   05/06/15 1530  BP: 125/70  Pulse: 66  Temp: 97.6 F (36.4 C)  TempSrc: Axillary  Weight: 169 lb (76.658 kg)  SpO2: 99%     Constitutional: Alert and oriented. Well appearing and in no acute distress. Eyes: roving eye movements, legally blind. Head: Atraumatic. Nose: No congestion/rhinnorhea. Mouth/Throat: Mucous membranes are moist.  Oropharynx non-erythematous. Neck: No stridor.  Cardiovascular: Normal rate, regular rhythm. Grossly normal heart sounds.  Good peripheral circulation. Respiratory: Normal respiratory effort.  No retractions. Lungs CTAB. Gastrointestinal: Soft and nontender. No distention. No abdominal bruits. No CVA tenderness. Genitourinary: deferred Musculoskeletal: No lower extremity tenderness nor edema.  No joint effusions.  left Anterior chest wall tenderness which is reproducible with palpation.  Neurologic:  Normal speech and language. No gross focal neurologic deficits are appreciated. Speech is normal. No gait instability. Skin:  Skin is warm, dry and intact.  No rash noted. Psychiatric: Mood and affect are normal. Speech and behavior are normal.  ____________________________________________   LABS (all labs ordered are listed, but only abnormal results are displayed)  Labs Reviewed  CBC WITH DIFFERENTIAL/PLATELET  COMPREHENSIVE METABOLIC PANEL  TROPONIN I   ____________________________________________  EKG  ED ECG REPORT I, Gayla Doss, the attending physician, personally viewed and interpreted this ECG.   Date: 05/06/2015  EKG Time: 15:24  Rate: 55  Rhythm: sinus bradycardia  Axis: normal  Intervals:right bundle branch block  ST&T Change: no acute ST elevation, T waves flat in aVF  ____________________________________________  RADIOLOGY   CXR IMPRESSION: Mildly increased pulmonary vascular congestion but no overt edema.    ____________________________________________   PROCEDURES  Procedure(s) performed: None  Critical Care performed: No  ____________________________________________   INITIAL IMPRESSION / ASSESSMENT AND PLAN / ED COURSE  Pertinent labs & imaging results that were available during my care of the patient were reviewed by me and considered in my medical decision making (see chart for details).  Erin [name] is a 44 y.o. female with history of end-stage renal disease on dialysis, dialyzed Monday Wednesday Friday, legal blindness, chronic pain/fibromyalgia, panhypopituitarism, history of DVT, bipolar disorder, seizures who presents for evaluation of gradual onset chest pain. On exam, she is generally well-appearing and in no acute distress. She has reproducible left anterior chest wall tenderness to palpation, suspect muscular skeletal pain however  given increased risk for accelerated coronary artery disease, we'll obtain EKG, chest x-ray, 2 sets of cardiac enzymes. Pain is not pleuritic, she is not hypoxic, not tachypneic, doubt PE. Pain is not ripping or tearing in nature, does not radiate to back or feet and I doubt acute aortic dissection. We'll treat her pain with Dilaudid which she is prescribed. Reassess for disposition.   ----------------------------------------- 6:51 PM on 05/06/2015 -----------------------------------------  Labs reviewed, notable for creatinine elevation which I would expect in the setting of her chronic kidney disease, no hyperkalemia. First troponin is negative. Chest x-ray with increased pulmonary vascular congestion but no overt edema. At this time, the patient reports that she will not stay for second troponin. She reports that she needs to get home immediately, her mother is coming to pick her up. Discussed with her that we have not ruled out a heart attack at this point and encouraged her to stay for additional testing. She again has refused. I discussed risks and benefits of leaving AGAINST MEDICAL ADVICE, the risks including heartattack,heartfailure premature and sudden death, prolonged disability. She voices understanding of this and is able to repeat back to me "I know that I could have a heart attack and die if I don't stay". She is legally blind. I asked her if she was able to read the AMA form. She said "no but if you read it to me I can sign it". It was read to her on 2 separate occasions, once by me and once by her nurse, she voiced understanding, has capacity to make this decision for herself, and has signed the [facility] form. Discussed return precautions and invited her to return at any time if she changes her mind. At this time, her pain is improved and she is sitting up in bed eating a sandwich. ____________________________________________   FINAL CLINICAL IMPRESSION(S) / ED DIAGNOSES  Final diagnoses:   Chest pain, unspecified chest pain type      Gayla Doss, MD 05/06/15 [id number]

## 2015-05-06 NOTE — ED Notes (Signed)
Pt requests to leave AMA. States that her mother doesn't drive after dark and that she needs to be picked up. Advised pt that it will be light for about 2 more hours and that should give [place] time to get her results, but she insists that she needs to leave. AMA form signed and Dr. Inocencio Homes notified.

## 2015-05-06 NOTE — ED Notes (Signed)
Pt via ems from dialysis clinic where she began suffering from substernal chest pain radiating to left side while taking dialysis. Pt states she has had chest pain in the past, the last time a month ago.  Pt reports some nausea with the pain but nothing was found to be wrong. She was referred to cardiologist, where she was given a stress test and never went to her follow up appt. Pt alert & oriented. Pt is blind.

## 2015-05-30 ENCOUNTER — Emergency Department
Admission: EM | Admit: 2015-05-30 | Discharge: 2015-05-30 | Disposition: A | Discharge status: Home / Self Care | Payer: Medicare Other | Attending: Emergency Medicine | Admitting: Emergency Medicine

## 2015-05-30 ENCOUNTER — Encounter: Payer: Self-pay | Admitting: *Deleted

## 2015-05-30 ENCOUNTER — Emergency Department: Payer: Medicare Other

## 2015-05-30 DIAGNOSIS — R51 Headache: Secondary | ICD-10-CM | POA: Diagnosis not present

## 2015-05-30 DIAGNOSIS — Z7951 Long term (current) use of inhaled steroids: Secondary | ICD-10-CM | POA: Insufficient documentation

## 2015-05-30 DIAGNOSIS — Z720 Tobacco use: Secondary | ICD-10-CM | POA: Insufficient documentation

## 2015-05-30 DIAGNOSIS — Z7952 Long term (current) use of systemic steroids: Secondary | ICD-10-CM | POA: Insufficient documentation

## 2015-05-30 DIAGNOSIS — Z79899 Other long term (current) drug therapy: Secondary | ICD-10-CM | POA: Insufficient documentation

## 2015-05-30 DIAGNOSIS — H548 Legal blindness, as defined in USA: Secondary | ICD-10-CM | POA: Insufficient documentation

## 2015-05-30 DIAGNOSIS — G8928 Other chronic postprocedural pain: Secondary | ICD-10-CM | POA: Diagnosis not present

## 2015-05-30 DIAGNOSIS — Z7982 Long term (current) use of aspirin: Secondary | ICD-10-CM | POA: Insufficient documentation

## 2015-05-30 DIAGNOSIS — R079 Chest pain, unspecified: Secondary | ICD-10-CM | POA: Diagnosis present

## 2015-05-30 DIAGNOSIS — G8929 Other chronic pain: Secondary | ICD-10-CM

## 2015-05-30 LAB — CBC WITH DIFFERENTIAL/PLATELET
BASOS PCT: 2 %
Basophils Absolute: 0.2 10*3/uL — ABNORMAL HIGH (ref 0–0.1)
EOS ABS: 0.2 10*3/uL (ref 0–0.7)
EOS PCT: 2 %
HCT: 32.3 % — ABNORMAL LOW (ref 35.0–47.0)
HEMOGLOBIN: 10.4 g/dL — AB (ref 12.0–16.0)
Lymphocytes Relative: 13 %
Lymphs Abs: 1.6 10*3/uL (ref 1.0–3.6)
MCH: 31.3 pg (ref 26.0–34.0)
MCHC: 32.3 g/dL (ref 32.0–36.0)
MCV: 96.9 fL (ref 80.0–100.0)
MONO ABS: 0.7 10*3/uL (ref 0.2–0.9)
MONOS PCT: 6 %
NEUTROS ABS: 9.6 10*3/uL — AB (ref 1.4–6.5)
NEUTROS PCT: 77 %
Platelets: 219 10*3/uL (ref 150–440)
RBC: 3.33 MIL/uL — ABNORMAL LOW (ref 3.80–5.20)
RDW: 16.7 % — AB (ref 11.5–14.5)
WBC: 12.4 10*3/uL — ABNORMAL HIGH (ref 3.6–11.0)

## 2015-05-30 LAB — BASIC METABOLIC PANEL
Anion gap: 15 (ref 5–15)
BUN: 80 mg/dL — ABNORMAL HIGH (ref 6–20)
CO2: 26 mmol/L (ref 22–32)
Calcium: 9.4 mg/dL (ref 8.9–10.3)
Chloride: 96 mmol/L — ABNORMAL LOW (ref 101–111)
Creatinine, Ser: 8.13 mg/dL — ABNORMAL HIGH (ref 0.44–1.00)
GFR calc Af Amer: 6 mL/min — ABNORMAL LOW (ref >60)
GFR calc non Af Amer: 5 mL/min — ABNORMAL LOW (ref >60)
GLUCOSE: 94 mg/dL (ref 65–99)
POTASSIUM: 4.3 mmol/L (ref 3.5–5.1)
SODIUM: 137 mmol/L (ref 135–145)

## 2015-05-30 MED ORDER — OXYCODONE-ACETAMINOPHEN 5-325 MG PO TABS
1.0000 | ORAL_TABLET | Freq: Once | ORAL | Status: AC
  Administered 2015-05-30: 1 via ORAL
  Filled 2015-05-30: qty 1

## 2015-05-30 NOTE — Discharge Instructions (Signed)

## 2015-05-30 NOTE — ED Notes (Signed)
Pt presents via EMS on stretcher from home with report of having procedure yesterday to declot AV fistula. Pt c/o pain to throat and chest since arriving home from procedure. No difficulty breathing.

## 2015-05-30 NOTE — ED Provider Notes (Signed)
[facility] Emergency Department Provider Note  ____________________________________________  Time seen: 5:30 AM  I have reviewed the triage vital signs and the nursing notes.   HISTORY  Chief Complaint Chest Pain    HPI Erin [name] is a 44 y.o. female who complains of multiple issues. She was seen at [facility] in [facility] yesterday for eye thrombosed AV graft in the left arm. After the procedure to clear it, she was discharged home. She notes that upon returning home, she had worsening of her headache as well as pain in the chest which she indicates in the left anterior area left axillary area and right anterior area, although she states these are of different pains and not radiation are connected. No nausea vomiting or diaphoresis. No shortness of breath. No fevers or chills. The pains are sharp and intermittent and not pleuritic or exertional  Last dialysis 3 days ago   Past Medical History  Diagnosis Date  . Fibromyalgia   . Chronic headaches     Seeing Pain Management  . Hyperlipidemia   . Psychosis   . Hypothyroid   . Narcolepsy   . Asthma   . Constipation   . Kidney failure     Currently on Dialysis  . Blind   . Epilepsy   . History of DVT (deep vein thrombosis)   . History of CVA (cerebrovascular accident)    chronic headaches, chronic chest pain, chronic back pain  There are no active problems to display for this patient.   Past Surgical History  Procedure Laterality Date  . Brain surgery  1976  . Cholecystectomy  2001  . Breast lumpectomy  2001  . Pituitary excision  1976  . Dg av dialysis graft declot or      X 4    Current Outpatient Rx  Name  Route  Sig  Dispense  Refill  . albuterol (PROVENTIL HFA;VENTOLIN HFA) 108 (90 BASE) MCG/ACT inhaler   Inhalation   Inhale 2 puffs into the lungs every 6 (six) hours as needed for wheezing or shortness of breath.         Marland Kitchen aspirin 81 MG chewable  tablet   Oral   Chew 81 mg by mouth daily.         . budesonide-formoterol (SYMBICORT) 80-4.5 MCG/ACT inhaler   Inhalation   Inhale 2 puffs into the lungs 2 (two) times daily.         . calcium acetate (PHOSLO) 667 MG capsule   Oral   Take 2,001-3,335 mg by mouth 5 (five) times daily. Pt takes five capsules with meals and three capsules with snacks.         . fenofibrate (TRICOR) 145 MG tablet   Oral   Take 145 mg by mouth every other day.          . fludrocortisone (FLORINEF) 0.1 MG tablet   Oral   Take 0.1 mg by mouth daily.         Marland Kitchen FLUoxetine (PROZAC) 40 MG capsule   Oral   Take 80 mg by mouth daily.         . folic acid-vitamin b complex-vitamin c-selenium-zinc (DIALYVITE) 3 MG TABS tablet   Oral   Take 1 tablet by mouth daily.         Marland Kitchen gabapentin (NEURONTIN) 100 MG capsule   Oral   Take 400 mg by mouth at bedtime.         . hydrocortisone (CORTEF)  10 MG tablet   Oral   Take 5-10 mg by mouth 2 (two) times daily. Pt takes 10 mg in the morning and 5 mg at bedtime.         Marland Kitchen HYDROmorphone (DILAUDID) 4 MG tablet   Oral   Take 6 mg by mouth every 6 (six) hours as needed for severe pain.          Marland Kitchen levothyroxine (SYNTHROID, LEVOTHROID) 112 MCG tablet   Oral   Take 112 mcg by mouth daily.          Marland Kitchen lovastatin (MEVACOR) 40 MG tablet   Oral   Take 40 mg by mouth every evening.          Marland Kitchen MELATONIN PO   Oral   Take 2 tablets by mouth at bedtime.         . methylphenidate (RITALIN) 20 MG tablet   Oral   Take 20-30 mg by mouth 2 (two) times daily. Pt takes 30 mg in the morning and 20 mg at lunch.         . Naloxone HCl 0.4 MG/0.4ML SOAJ   Injection   Inject 1 Dose as directed every 10 (ten) minutes as needed (if patient is not breathing).          . OxyCODONE (OXYCONTIN) 40 mg T12A 12 hr tablet   Oral   Take 40 mg by mouth 3 (three) times daily.         . polyethylene glycol (MIRALAX / GLYCOLAX) packet   Oral   Take 17 g by  mouth daily as needed for mild constipation.          . risperidone (RISPERDAL) 4 MG tablet   Oral   Take 2-4 mg by mouth 2 (two) times daily. Pt takes 4 mg in the morning and 2 mg at bedtime.         . topiramate (TOPAMAX) 200 MG tablet   Oral   Take 200 mg by mouth 2 (two) times daily. Pt takes with a 50 mg tablet.         . topiramate (TOPAMAX) 50 MG tablet   Oral   Take 50 mg by mouth 2 (two) times daily. Pt takes with a 200 mg tablet.         . Vitamin D, Ergocalciferol, (DRISDOL) 50000 UNITS CAPS capsule   Oral   Take 50,000 Units by mouth every 30 (thirty) days.           Allergies Dhea; Demerol; Floxin; Nsaids; Nubain; Phenergan; Stadol; and Erythromycin  History reviewed. No pertinent family history.  Social History History  Substance Use Topics  . Smoking status: Current Every Day Smoker -- 0.50 packs/day for 16 years    Types: Cigarettes  . Smokeless tobacco: Not on file  . Alcohol Use: 0.0 oz/week    0 Standard drinks or equivalent per week     Comment: occasionally    Review of Systems  Constitutional: No fever or chills. No weight changes Eyes: Chronic blindness  ENT: No sore throat. Cardiovascular: Positive chest pain. Respiratory: No dyspnea or cough. Gastrointestinal: Negative for abdominal pain, vomiting and diarrhea.  No BRBPR or melena. Genitourinary: Negative for dysuria, urinary retention, bloody urine, or difficulty urinating. Musculoskeletal: Negative for back pain. No joint swelling or pain. Skin: Negative for rash. Neurological: Negative for headaches, focal weakness or numbness. Psychiatric:No anxiety or depression.   Endocrine:No hot/cold intolerance, changes in energy, or sleep difficulty.  10-point ROS  otherwise negative.  ____________________________________________   PHYSICAL EXAM:  VITAL SIGNS: ED Triage Vitals  Enc Vitals Group     BP 05/30/15 0540 120/68 mmHg     Pulse Rate 05/30/15 0540 84     Resp 05/30/15  0540 20     Temp 05/30/15 0540 99 F (37.2 C)     Temp Source 05/30/15 0540 Oral     SpO2 05/30/15 0540 95 %     Weight 05/30/15 0540 169 lb (76.658 kg)     Height 05/30/15 0540  (1.473 m)     Head Cir --      Peak Flow --      Pain Score 05/30/15 0541 8     Pain Loc --      Pain Edu? --      Excl. in GC? --      Constitutional: Alert and oriented. Well appearing and in no distress. Calm Eyes: No scleral icterus. No conjunctival pallor. PERRL ENT   Head: Normocephalic and atraumatic.   Nose: No congestion/rhinnorhea. No septal hematoma   Mouth/Throat: MMM, no pharyngeal erythema. No peritonsillar mass. No uvula shift.   Neck: No stridor. No SubQ emphysema. No meningismus. Hematological/Lymphatic/Immunilogical: No cervical lymphadenopathy. Cardiovascular: RRR. Normal and symmetric distal pulses are present in all extremities. No murmurs, rubs, or gallops. Respiratory: Normal respiratory effort without tachypnea nor retractions. Breath sounds are clear and equal bilaterally. No wheezes/rales/rhonchi. Gastrointestinal: Soft and nontender. No distention. There is no CVA tenderness.  No rebound, rigidity, or guarding. Genitourinary: deferred Musculoskeletal: Nontender with normal range of motion in all extremities. No joint effusions.  No lower extremity tenderness.  No edema. Left upper extremity AV graft with strong pulsation and bruit Neurologic:   Normal speech and language.  CN 2-10 normal. Motor grossly intact. No pronator drift.  Normal gait. No gross focal neurologic deficits are appreciated.  Skin:  Skin is warm, dry and intact. No rash noted.  No petechiae, purpura, or bullae. Psychiatric: Mood and affect are normal. Speech and behavior are normal. Patient exhibits appropriate insight and judgment.  ____________________________________________    LABS (pertinent positives/negatives) (all labs ordered are listed, but only abnormal results are  displayed) Labs Reviewed  BASIC METABOLIC PANEL - Abnormal; Notable for the following:    Chloride 96 (*)    BUN 80 (*)    Creatinine, Ser 8.13 (*)    GFR calc non Af Amer 5 (*)    GFR calc Af Amer 6 (*)    All other components within normal limits  CBC WITH DIFFERENTIAL/PLATELET - Abnormal; Notable for the following:    WBC 12.4 (*)    RBC 3.33 (*)    Hemoglobin 10.4 (*)    HCT 32.3 (*)    RDW 16.7 (*)    Neutro Abs 9.6 (*)    Basophils Absolute 0.2 (*)    All other components within normal limits   ____________________________________________   EKG    ____________________________________________    RADIOLOGY  Chest x-ray unremarkable  ____________________________________________   PROCEDURES  ____________________________________________   INITIAL IMPRESSION / ASSESSMENT AND PLAN / ED COURSE  Pertinent labs & imaging results that were available during my care of the patient were reviewed by me and considered in my medical decision making (see chart for details).  Patient presents with acute on chronic pain related to recent AV graft procedure. Low suspicion for ACS PE TAD pneumothorax carditis mediastinitis. No evidence of pneumonia or sepsis. No evidence of infection of the  left upper extremity around the graft, and the access sites are hemostatic. Labs are as expected, and do not indicate a need for urgent dialysis. Patient is medically stable for discharge home, to follow up with her dialysis center and primary care doctor.  ____________________________________________   FINAL CLINICAL IMPRESSION(S) / ED DIAGNOSES  Final diagnoses:  Chronic pain      Sharman Cheek, MD 05/30/15 [phone]

## 2015-05-30 NOTE — ED Notes (Signed)
Notified MD of pt's continued pain. Pt eating ice and talking on phone. No distress noted.

## 2015-08-21 ENCOUNTER — Emergency Department
Admission: EM | Admit: 2015-08-21 | Discharge: 2015-08-21 | Disposition: A | Discharge status: Home / Self Care | Payer: Medicare Other | Attending: Emergency Medicine | Admitting: Emergency Medicine

## 2015-08-21 ENCOUNTER — Encounter: Admission: EM | Disposition: A | Discharge status: Home / Self Care | Payer: Self-pay | Source: Home / Self Care

## 2015-08-21 ENCOUNTER — Encounter: Payer: Self-pay | Admitting: *Deleted

## 2015-08-21 DIAGNOSIS — Z4931 Encounter for adequacy testing for hemodialysis: Secondary | ICD-10-CM | POA: Diagnosis not present

## 2015-08-21 DIAGNOSIS — N186 End stage renal disease: Secondary | ICD-10-CM | POA: Diagnosis not present

## 2015-08-21 DIAGNOSIS — Z720 Tobacco use: Secondary | ICD-10-CM | POA: Diagnosis not present

## 2015-08-21 HISTORY — PX: PERIPHERAL VASCULAR CATHETERIZATION: SHX172C

## 2015-08-21 LAB — BASIC METABOLIC PANEL
Anion gap: 11 (ref 5–15)
BUN: 67 mg/dL — ABNORMAL HIGH (ref 6–20)
CO2: 32 mmol/L (ref 22–32)
Calcium: 9.6 mg/dL (ref 8.9–10.3)
Chloride: 96 mmol/L — ABNORMAL LOW (ref 101–111)
Creatinine, Ser: 7.03 mg/dL — ABNORMAL HIGH (ref 0.44–1.00)
GFR calc Af Amer: 8 mL/min — ABNORMAL LOW (ref >60)
GFR calc non Af Amer: 6 mL/min — ABNORMAL LOW (ref >60)
Glucose, Bld: 124 mg/dL — ABNORMAL HIGH (ref 65–99)
Potassium: 4 mmol/L (ref 3.5–5.1)
SODIUM: 139 mmol/L (ref 135–145)

## 2015-08-21 SURGERY — DIALYSIS/PERMA CATHETER INSERTION
Anesthesia: Moderate Sedation

## 2015-08-21 MED ORDER — FENTANYL CITRATE (PF) 100 MCG/2ML IJ SOLN
INTRAMUSCULAR | Status: AC
  Filled 2015-08-21: qty 2

## 2015-08-21 MED ORDER — MIDAZOLAM HCL 5 MG/5ML IJ SOLN
INTRAMUSCULAR | Status: AC
  Filled 2015-08-21: qty 5

## 2015-08-21 MED ORDER — MIDAZOLAM HCL 5 MG/5ML IJ SOLN
INTRAMUSCULAR | Status: AC
Start: 2015-08-21 — End: 2015-08-21
  Filled 2015-08-21: qty 5

## 2015-08-21 MED ORDER — LIDOCAINE-EPINEPHRINE (PF) 1 %-1:200000 IJ SOLN
INTRAMUSCULAR | Status: AC
  Filled 2015-08-21: qty 30

## 2015-08-21 MED ORDER — HEPARIN SODIUM (PORCINE) 10000 UNIT/ML IJ SOLN
INTRAMUSCULAR | Status: AC
  Filled 2015-08-21: qty 1

## 2015-08-21 MED ORDER — HEPARIN (PORCINE) IN NACL 2-0.9 UNIT/ML-% IJ SOLN
INTRAMUSCULAR | Status: AC
  Filled 2015-08-21: qty 1000

## 2015-08-21 MED ORDER — MIDAZOLAM HCL 2 MG/2ML IJ SOLN
INTRAMUSCULAR | Status: DC | PRN
  Administered 2015-08-21: 2 mg via INTRAVENOUS
  Administered 2015-08-21: 1 mg via INTRAVENOUS
  Administered 2015-08-21: 2 mg via INTRAVENOUS
  Administered 2015-08-21: 1 mg via INTRAVENOUS

## 2015-08-21 MED ORDER — HEPARIN SODIUM (PORCINE) 1000 UNIT/ML IJ SOLN
INTRAMUSCULAR | Status: AC
  Filled 2015-08-21: qty 1

## 2015-08-21 MED ORDER — CEFUROXIME SODIUM 1.5 G IJ SOLR
INTRAMUSCULAR | Status: AC
  Administered 2015-08-21: 16:00:00
  Filled 2015-08-21: qty 1.5

## 2015-08-21 MED ORDER — FENTANYL CITRATE (PF) 100 MCG/2ML IJ SOLN
INTRAMUSCULAR | Status: DC | PRN
  Administered 2015-08-21: 100 ug via INTRAVENOUS
  Administered 2015-08-21: 50 ug via INTRAVENOUS
  Administered 2015-08-21: 100 ug via INTRAVENOUS
  Administered 2015-08-21 (×2): 50 ug via INTRAVENOUS

## 2015-08-21 SURGICAL SUPPLY — 13 items
BIOPATCH RED 1 DISK 7.0 (GAUZE/BANDAGES/DRESSINGS) ×2 IMPLANT
BIOPATCH RED 1IN DISK 7.0MM (GAUZE/BANDAGES/DRESSINGS) ×1
CATH PALINDROME RT-P 15FX19CM (CATHETERS) ×3 IMPLANT
CATH TORCON 5FR 0.38 (CATHETERS) ×3 IMPLANT
DERMABOND ADVANCED (GAUZE/BANDAGES/DRESSINGS) ×2
DERMABOND ADVANCED .7 DNX12 (GAUZE/BANDAGES/DRESSINGS) ×1 IMPLANT
PACK ANGIOGRAPHY (CUSTOM PROCEDURE TRAY) ×3 IMPLANT
SET INTRO CAPELLA COAXIAL (SET/KITS/TRAYS/PACK) ×3 IMPLANT
SHEATH BRITE TIP 5FRX11 (SHEATH) ×3 IMPLANT
SUT MNCRL AB 4-0 PS2 18 (SUTURE) ×3 IMPLANT
SUT SILK 0 FSL (SUTURE) ×3 IMPLANT
TOWEL OR 17X26 4PK STRL BLUE (TOWEL DISPOSABLE) ×3 IMPLANT
WIRE MAGIC TOR.035 180C (WIRE) ×3 IMPLANT

## 2015-08-21 NOTE — Op Note (Signed)
OPERATIVE NOTE   PROCEDURE: 1. Insertion of tunneled dialysis catheter left IJ approach.  PRE-OPERATIVE DIAGNOSIS: Complication of dialysis device with thrombosis of her AV access; end-stage renal disease requiring hemodialysis; superior vena cava syndrome  POST-OPERATIVE DIAGNOSIS: Same  SURGEON: Schnier, Latina Craver.  ANESTHESIA: Conscious sedation with 1% lidocaine local infiltration  ESTIMATED BLOOD LOSS: Minimal cc  CONTRAST USED:  None  FLUOROSCOPY TIME:    INDICATIONS:   Erin Daniele Neighboursis a 44 y.o. y.o. female who presents with thrombosis of her AV access. She has undergone multiple thrombectomies in the last several weeks and already has arrangements with [facility] vascular surgery for creation of a new access. Given the circumstances I will place a catheter as a bridge to her new access..  DESCRIPTION: After obtaining full informed written consent, the patient was positioned supine. The left neck and chest wall was prepped and draped in a sterile fashion. Ultrasound was placed in a sterile sleeve. Ultrasound was utilized to identify the left internal jugular vein which is noted to be echolucent and compressible indicating patency. Images recorded for the permanent record. Under real-time visualization a Seldinger needle is inserted into the vein and the guidewires advanced without difficulty. Small counterincision was made at the wire insertion site. Dilators are passed over the wire and the tunneled dialysis catheter is fed into the central venous system without difficulty.  Under fluoroscopy the catheter tip positioned at the atrial caval junction. The catheter is then approximated to the chest wall and an exit site selected. 1% lidocaine is infiltrated in soft tissues at this level small incision is made and the tunneling device is then passed from the exit site to the neck counterincision. Catheter is then connected to the tunneling device and the catheter was pulled subcutaneously.  It is then transected and the hub assembly connected without difficulty. Both lumens aspirate and flush easily. After verification of smooth contour with proper tip position under fluoroscopy the catheter is packed with 5000 units of heparin per lumen.  Catheter secured to the skin of the left chest with 0 silk. A sterile dressing is applied with a Biopatch.  COMPLICATIONS: None  CONDITION: Good  Schnier, Latina Craver [place] [facility]. Office:  [phone]   08/21/2015,4:56 PM

## 2015-08-21 NOTE — Discharge Instructions (Signed)
, Care After °Refer to this sheet in the next few weeks. These instructions provide you with information on caring for yourself after your procedure. Your caregiver may also give you more specific instructions. Your treatment has been planned according to current medical practices, but problems sometimes occur. Call your caregiver if you have any problems or questions after your procedure.  °HOME CARE INSTRUCTIONS °· Rest at home the day of the procedure. You will likely be able to return to normal activities the following day. °· Follow your caregiver's specific instructions for the type of device that you have. °· Only take over-the-counter or prescription medicines as directed by your caregiver. °· Keep the insertion site of the catheter clean and dry at all times. °¨ Change the bandages (dressings) over the catheter site as directed by your caregiver. °¨ Wash the area around the catheter site during each dressing change. Sponge bathe the area using a germ-killing (antiseptic) solution as directed by your caregiver. °¨ Look for redness or swelling at the insertion site during each dressing change. °· Apply an antibiotic ointment as directed by your caregiver. °· Flush your catheter as directed to keep it from becoming clogged. °· Always wash your hands thoroughly before changing dressings or flushing the catheter. °· Do not let air enter the catheter. °¨ Never open the cap at the catheter tip. °¨ Always make sure there is no air in the syringe or in the tubing for infusions.    °· Do not lift anything heavy. °· Do not drive until your caregiver approves. °· Do not shower or bathe until your caregiver approves. When you shower or bathe, place a piece of plastic wrap over the catheter site. Do not allow the catheter site or the dressing to get wet. If taking a bath, do not allow the catheter to get submerged in the water. °If the catheter was inserted through an arm vein:  °· Avoid wearing tight clothes or jewelry  on the arm that has the catheter.   °· Do not sleep with your head on the arm that has the catheter.   °· Do not allow use of a blood pressure cuff on the arm that has the catheter.   °· Do not let anyone draw blood from the arm that has the catheter, except through the catheter itself. °SEEK MEDICAL CARE IF: °· You have bleeding at the insertion site of the catheter.   °· You feel weak or nauseous.   °· Your catheter is not working properly.   °· You have redness, pain, swelling, and warmth at the insertion site.   °· You notice fluid draining from the insertion site.   °SEEK IMMEDIATE MEDICAL CARE IF: °· Your catheter breaks or has a hole in it.   °· Your catheter comes loose or gets pulled completely out. If this happens, hold firm pressure over the area with your hand or a clean cloth.   °· You have a fever. °· You have chills.   °· Your catheter becomes totally blocked.   °· You have swelling in your arm, shoulder, neck, or face.   °· You have bleeding from the insertion site that does not stop.   °· You develop chest pain or have trouble breathing.   °· You feel dizzy or faint.   °MAKE SURE YOU: °· Understand these instructions. °· Will watch your condition. °· Will get help right away if you are not doing well or get worse. °  °This information is not intended to replace advice given to you by your health care provider. Make sure you discuss any questions you   have with your health care provider. °  °Document Released: 10/03/2012 Document Revised: 06/19/2013 Document Reviewed: 10/03/2012 °Elsevier Interactive Patient Education ©2016 [employer] ° °

## 2015-08-25 ENCOUNTER — Encounter: Payer: Self-pay | Admitting: Vascular Surgery

## 2015-08-28 NOTE — H&P (Signed)
[facility] Admission History & Physical  MRN : [id number]  Erin [name] is a 44 y.o. (1970/12/11) female who presents with chief complaint of clotted dialysis access.  History of Present Illness: The patient is sent urgently by [facility] no nephrology secondary to clotting of her dialysis access. Her access has had multiple procedures performed several within the last 2 weeks. It is proven to be problematic with multiple rethrombosis episodes. She has now missed several dialysis runs. She is normally seen [facility] surgery and there is actually already a planned to creating new extremity access. I am asked to evaluate her urgently to secure a dialysis access given the number of times she has already missed dialysis. Patient denies shortness of breath no history of fever or chills.  No current facility-administered medications for this encounter.   Current Outpatient Prescriptions  Medication Sig Dispense Refill  . albuterol (PROVENTIL HFA;VENTOLIN HFA) 108 (90 BASE) MCG/ACT inhaler Inhale 2 puffs into the lungs every 6 (six) hours as needed for wheezing or shortness of breath.    . budesonide-formoterol (SYMBICORT) 80-4.5 MCG/ACT inhaler Inhale 2 puffs into the lungs 2 (two) times daily.    . calcium acetate (PHOSLO) 667 MG capsule Take 2,001-3,335 mg by mouth 5 (five) times daily. Pt takes five capsules with meals and three capsules with snacks.    . fenofibrate (TRICOR) 145 MG tablet Take 145 mg by mouth every other day.     . fludrocortisone (FLORINEF) 0.1 MG tablet Take 0.1 mg by mouth daily.    Marland Kitchen FLUoxetine (PROZAC) 40 MG capsule Take 80 mg by mouth daily.    . folic acid-vitamin b complex-vitamin c-selenium-zinc (DIALYVITE) 3 MG TABS tablet Take 1 tablet by mouth daily.    Marland Kitchen gabapentin (NEURONTIN) 100 MG capsule Take 400 mg by mouth at bedtime.    . hydrocortisone (CORTEF) 10 MG tablet Take 5-10 mg by mouth 2 (two) times daily. Pt takes 10 mg in the morning and 5 mg at  bedtime.    Marland Kitchen HYDROmorphone (DILAUDID) 4 MG tablet Take 6 mg by mouth every 6 (six) hours as needed for severe pain.     Marland Kitchen levothyroxine (SYNTHROID, LEVOTHROID) 112 MCG tablet Take 112 mcg by mouth daily.     Marland Kitchen lovastatin (MEVACOR) 40 MG tablet Take 40 mg by mouth every evening.     Marland Kitchen MELATONIN PO Take 2 tablets by mouth at bedtime.    . methylphenidate (RITALIN) 20 MG tablet Take 20-30 mg by mouth 2 (two) times daily. Pt takes 30 mg in the morning and 20 mg at lunch.    . OxyCODONE (OXYCONTIN) 40 mg T12A 12 hr tablet Take 40 mg by mouth 3 (three) times daily.    . risperidone (RISPERDAL) 4 MG tablet Take 2-4 mg by mouth 2 (two) times daily. Pt takes 4 mg in the morning and 2 mg at bedtime.    . topiramate (TOPAMAX) 200 MG tablet Take 200 mg by mouth 2 (two) times daily. Pt takes with a 50 mg tablet.    . topiramate (TOPAMAX) 50 MG tablet Take 50 mg by mouth 2 (two) times daily. Pt takes with a 200 mg tablet.    . Vitamin D, Ergocalciferol, (DRISDOL) 50000 UNITS CAPS capsule Take 50,000 Units by mouth every 30 (thirty) days.    Marland Kitchen aspirin 81 MG chewable tablet Chew 81 mg by mouth daily.    . Naloxone HCl 0.4 MG/0.4ML SOAJ Inject 1 Dose as directed every 10 (ten)  minutes as needed (if patient is not breathing).     . polyethylene glycol (MIRALAX / GLYCOLAX) packet Take 17 g by mouth daily as needed for mild constipation.       Past Medical History  Diagnosis Date  . Fibromyalgia   . Chronic headaches     Seeing Pain Management  . Hyperlipidemia   . Psychosis   . Hypothyroid   . Narcolepsy   . Asthma   . Constipation   . Kidney failure     Currently on Dialysis  . Blind   . Epilepsy (HCC)   . History of DVT (deep vein thrombosis)   . History of CVA (cerebrovascular accident)     Past Surgical History  Procedure Laterality Date  . Brain surgery  1976  . Cholecystectomy  2001  . Breast lumpectomy  2001  . Pituitary excision  1976  . Dg av dialysis graft declot or      X 4  .  Peripheral vascular catheterization N/A 08/21/2015    Procedure: Dialysis/Perma Catheter Insertion;  Surgeon: Renford Dills, MD;  Location: [facility] INVASIVE CV LAB;  Service: Cardiovascular;  Laterality: N/A;    Social History Social History  Substance Use Topics  . Smoking status: Current Every Day Smoker -- 0.50 packs/day for 16 years    Types: Cigarettes  . Smokeless tobacco: None  . Alcohol Use: 0.0 oz/week    0 Standard drinks or equivalent per week     Comment: occasionally    Family History History reviewed. No pertinent family history. no family history of porphyria, autoimmune disease or bleeding clotting disorders  Allergies  Allergen Reactions  . Dhea [Nutritional Supplements] Anaphylaxis  . Demerol [Meperidine] Other (See Comments)    Reaction:  Hallucinations   . Floxin [Ofloxacin] Other (See Comments)    Reaction:  Hallucinations   . Nsaids Nausea And Vomiting and Swelling  . Nubain [Nalbuphine Hcl] Other (See Comments)    Reaction:  Hallucinations   . Phenergan [Promethazine Hcl] Other (See Comments)    Reaction:  Restless legs   . Stadol [Butorphanol] Other (See Comments)    Reaction:  Hallucinations   . Erythromycin Diarrhea and Rash     REVIEW OF SYSTEMS (Negative unless checked)  Constitutional: [] Weight loss  [] Fever  [] Chills Cardiac: [] Chest pain   [] Chest pressure   [] Palpitations   [] Shortness of breath when laying flat   [] Shortness of breath at rest   [] Shortness of breath with exertion. Vascular:  [] Pain in legs with walking   [] Pain in legs at rest   [] Pain in legs when laying flat   [] Claudication   [] Pain in feet when walking  [] Pain in feet at rest  [] Pain in feet when laying flat   [] History of DVT   [] Phlebitis   [] Swelling in legs   [] Varicose veins   [] Non-healing ulcers Pulmonary:   [] Uses home oxygen   [] Productive cough   [] Hemoptysis   [] Wheeze  [] COPD   [] Asthma Neurologic:  [] Dizziness  [] Blackouts   [] Seizures   [] History of stroke    [] History of TIA  [] Aphasia   [] Temporary blindness   [] Dysphagia   [] Weakness or numbness in arms   [] Weakness or numbness in legs Musculoskeletal:  [] Arthritis   [] Joint swelling   [] Joint pain   [] Low back pain Hematologic:  [] Easy bruising  [] Easy bleeding   [] Hypercoagulable state   [] Anemic  [] Hepatitis Gastrointestinal:  [] Blood in stool   [] Vomiting blood  [] Gastroesophageal reflux/heartburn   []   Difficulty swallowing. Genitourinary:  Chronic kidney disease   Difficult urination  Frequent urination  Burning with urination   Blood in urine Skin:  Rashes   Ulcers   Wounds Psychological:  History of anxiety    History of major depression.  Physical Examination  Filed Vitals:   08/21/15 1630 08/21/15 1645 08/21/15 1652 08/21/15 1700  BP: 153/140 104/64 130/80 122/78  Pulse: 78 85 78 77  Temp:      Resp: 34 Height:      Weight:      SpO2: 98% 98%     Body mass index is 48.29 kg/(m^2). Gen: WD/WN, NAD Head: [place]/AT, No temporalis wasting.  Ear/Nose/Throat: Hearing grossly intact, nares w/o erythema or drainage, oropharynx w/o Erythema/Exudate, Eyes: PERRLA, EOMI.  Neck: Supple, no nuchal rigidity.  No bruit or JVD.  Pulmonary:  Good air movement, clear to auscultation bilaterally, no increased work of respiration or use of accessory muscles  Cardiac: RRR, normal S1, S2, no Murmurs, rubs or gallops. Vascular: Current arm access no thrill no bruit multiple incisional scars consistent with multiple past AV access Vessel Right Left  Radial  not Palpable .not Palpable  Ulnar  not Palpable  not Palpable  Brachial  trace Palpable  trace Palpable  Carotid Palpable, without bruit Palpable, without bruit  Gastrointestinal: soft, non-tender/non-distended. No guarding/reflex. No masses, surgical incisions, or scars. Musculoskeletal: M/S 5/5 throughout.  No deformity or atrophy. Neurologic: CN 2-12 intact. Pain and light touch intact in extremities.  Symmetrical.   Speech is fluent. Motor exam as listed above. Psychiatric: Judgment intact, Mood & affect appropriate for pt's clinical situation. Dermatologic: No rashes or ulcers noted.  No cellulitis or open wounds. Lymph : No Cervical, Axillary, or Inguinal lymphadenopathy.   CBC Lab Results  Component Value Date   WBC 12.4* 05/30/2015   HGB 10.4* 05/30/2015   HCT 32.3* 05/30/2015   MCV 96.9 05/30/2015   PLT 219 05/30/2015    BMET    Component Value Date/Time   NA 139 08/21/2015 1520   NA 140 11/25/2014 1200   K 4.0 08/21/2015 1520   K 3.7 11/25/2014 1200   CL 96* 08/21/2015 1520   CL 100 11/25/2014 1200   CO2 32 08/21/2015 1520   CO2 25 11/25/2014 1200   GLUCOSE 124* 08/21/2015 1520   GLUCOSE 104* 11/25/2014 1200   BUN 67* 08/21/2015 1520   BUN 56* 11/25/2014 1200   CREATININE 7.03* 08/21/2015 1520   CREATININE 6.17* 11/25/2014 1200   CALCIUM 9.6 08/21/2015 1520   CALCIUM 7.6* 11/25/2014 1200   GFRNONAA 6* 08/21/2015 1520   GFRNONAA 8* 11/25/2014 1200   GFRNONAA 9* 11/15/2013 2000   GFRAA 8* 08/21/2015 1520   GFRAA 10* 11/25/2014 1200   GFRAA 11* 11/15/2013 2000   Estimated Creatinine Clearance: 6.9 mL/min (by C-G formula based on Cr of 7.03).  COAG Lab Results  Component Value Date   INR 1.2 07/18/2013   INR 1.2 07/04/2013    Radiology No results found.  Assessment/Plan 1.  Complication dialysis device with thrombosis AV access:  Patient's AV dialysis access is thrombosed. She has now missed several dialysis runs she has had frequent interventions which have not proven durable. Therefore I will place a tunneled catheter so that she can receive her dialysis therapies urgently. She is already scheduled to follow[facility] with [facility] regarding surgical revision and upper extremity access Potassium will be drawn to ensure that it is an appropriate level prior to performing thrombectomy.  2.  End-stage renal disease requiring hemodialysis:  Patient will continue dialysis therapy  without further interruption if a successful thrombectomy is not achieved then catheter will be placed. Dialysis has already been arranged since the patient missed their previous session 3.  Hypertension:  Patient will continue medical management; nephrology is following no changes in oral medications. 4. Diabetes mellitus:  Glucose will be monitored and oral medications been held this morning once the patient has undergone the patient's procedure po intake will be reinitiated and again Accu-Cheks will be used to assess the blood glucose level and treat as needed. The patient will be restarted on the patient's usual hypoglycemic regime 5.  Coronary artery disease:  EKG will be monitored. Nitrates will be used if needed. The patient's oral cardiac medications will be continued.     Schnier, Latina Craver, MD  08/28/2015 10:38 AM

## 2015-10-03 ENCOUNTER — Emergency Department
Admission: EM | Admit: 2015-10-03 | Discharge: 2015-10-05 | Disposition: A | Discharge status: Home / Self Care | Payer: Medicare Other | Attending: Emergency Medicine | Admitting: Emergency Medicine

## 2015-10-03 DIAGNOSIS — Z7951 Long term (current) use of inhaled steroids: Secondary | ICD-10-CM | POA: Diagnosis not present

## 2015-10-03 DIAGNOSIS — Z79899 Other long term (current) drug therapy: Secondary | ICD-10-CM | POA: Diagnosis not present

## 2015-10-03 DIAGNOSIS — F17210 Nicotine dependence, cigarettes, uncomplicated: Secondary | ICD-10-CM | POA: Insufficient documentation

## 2015-10-03 DIAGNOSIS — Z7982 Long term (current) use of aspirin: Secondary | ICD-10-CM | POA: Diagnosis not present

## 2015-10-03 DIAGNOSIS — R569 Unspecified convulsions: Secondary | ICD-10-CM | POA: Diagnosis present

## 2015-10-03 DIAGNOSIS — Z7952 Long term (current) use of systemic steroids: Secondary | ICD-10-CM | POA: Insufficient documentation

## 2015-10-03 HISTORY — DX: Major depressive disorder, single episode, unspecified: F32.9

## 2015-10-03 HISTORY — DX: Bipolar disorder, current episode depressed, mild or moderate severity, unspecified: F31.30

## 2015-10-03 HISTORY — DX: Depression, unspecified: F32.A

## 2015-10-03 HISTORY — DX: Anxiety disorder, unspecified: F41.9

## 2015-10-03 LAB — URINALYSIS COMPLETE WITH MICROSCOPIC (ARMC ONLY)
Bacteria, UA: NONE SEEN
Bilirubin Urine: NEGATIVE
Glucose, UA: NEGATIVE mg/dL
KETONES UR: NEGATIVE mg/dL
LEUKOCYTES UA: NEGATIVE
Nitrite: NEGATIVE
PH: 8 (ref 5.0–8.0)
PROTEIN: 100 mg/dL — AB
SPECIFIC GRAVITY, URINE: 1.006 (ref 1.005–1.030)

## 2015-10-03 MED ORDER — TOPIRAMATE 100 MG PO TABS
200.0000 mg | ORAL_TABLET | Freq: Once | ORAL | Status: AC
Start: 2015-10-03 — End: 2015-10-04
  Administered 2015-10-04: 200 mg via ORAL
  Filled 2015-10-03: qty 2

## 2015-10-03 NOTE — ED Provider Notes (Signed)
[facility] Emergency Department Provider Note  ____________________________________________  Time seen: Approximately 11:18 PM  I have reviewed the triage vital signs and the nursing notes.   HISTORY  Chief Complaint Seizures    HPI Erin [name] is a 44 y.o. female history of end-stage renal disease on dialysis, dialyzed Monday Wednesday Friday, legal blindness, chronic pain/fibromyalgia, panhypopituitarism, migraines, history of DVT, bipolar disorder, seizures who presents for evaluation of possible seizure which occurred tonight while she was on the couch, sudden onset just prior to arrival, now resolved, no modifying factors. The patient's primary caregiver, her mother, died approximately a week ago. As such, she currently has a new  Part-time caregiver who is unfamiliar with her care and has only been caring for her for the past 2 days. When the caregiver came to try to awaken her this evening to give her her nightly medications, she noted that the patient was somewhat difficult to arouse and had some twitching activity. On EMS arrival, the patient was awake, alert with stable vital signs. Patient reports that she has had sore throat today however denies any other symptoms of illness including no cough, fever, vomiting, diarrhea, chills. No chest pain or difficulty breathing. She reports compliance with her medications but did not receive nightly medications this evening due to falling asleep on the couch and EMS being called.   Past Medical History  Diagnosis Date  . Fibromyalgia   . Chronic headaches     Seeing Pain Management  . Hyperlipidemia   . Psychosis   . Hypothyroid   . Narcolepsy   . Asthma   . Constipation   . Kidney failure     Currently on Dialysis  . Blind   . Epilepsy (HCC)   . History of DVT (deep vein thrombosis)   . History of CVA (cerebrovascular accident)     There are no active problems to display for this  patient.   Past Surgical History  Procedure Laterality Date  . Brain surgery  1976  . Cholecystectomy  2001  . Breast lumpectomy  2001  . Pituitary excision  1976  . Dg av dialysis graft declot or      X 4  . Peripheral vascular catheterization N/A 08/21/2015    Procedure: Dialysis/Perma Catheter Insertion;  Surgeon: Renford Dills, MD;  Location: [facility] INVASIVE CV LAB;  Service: Cardiovascular;  Laterality: N/A;    Current Outpatient Rx  Name  Route  Sig  Dispense  Refill  . albuterol (PROVENTIL HFA;VENTOLIN HFA) 108 (90 BASE) MCG/ACT inhaler   Inhalation   Inhale 2 puffs into the lungs every 6 (six) hours as needed for wheezing or shortness of breath.         Marland Kitchen aspirin 81 MG chewable tablet   Oral   Chew 81 mg by mouth daily.         . budesonide-formoterol (SYMBICORT) 80-4.5 MCG/ACT inhaler   Inhalation   Inhale 2 puffs into the lungs 2 (two) times daily.         . calcium acetate (PHOSLO) 667 MG capsule   Oral   Take 2,001-3,335 mg by mouth 5 (five) times daily. Pt takes five capsules with meals and three capsules with snacks.         . fenofibrate (TRICOR) 145 MG tablet   Oral   Take 145 mg by mouth every other day.          . fludrocortisone (FLORINEF) 0.1 MG tablet   Oral  Take 0.1 mg by mouth daily.         . folic acid-vitamin b complex-vitamin c-selenium-zinc (DIALYVITE) 3 MG TABS tablet   Oral   Take 1 tablet by mouth daily.         . furosemide (LASIX) 80 MG tablet   Oral   Take 80 mg by mouth 2 (two) times daily.         Marland Kitchen gabapentin (NEURONTIN) 100 MG capsule   Oral   Take 400 mg by mouth at bedtime.         . hydrocortisone (CORTEF) 10 MG tablet   Oral   Take 5-10 mg by mouth 2 (two) times daily. Pt takes 10 mg in the morning and 5 mg at bedtime.         Marland Kitchen HYDROmorphone (DILAUDID) 4 MG tablet   Oral   Take 6 mg by mouth every 6 (six) hours as needed for severe pain.          . hydrOXYzine (ATARAX/VISTARIL) 25 MG  tablet   Oral   Take 25 mg by mouth 3 (three) times daily.         Marland Kitchen levothyroxine (SYNTHROID, LEVOTHROID) 112 MCG tablet   Oral   Take 112 mcg by mouth daily.          Marland Kitchen lovastatin (MEVACOR) 40 MG tablet   Oral   Take 20 mg by mouth every evening.          Marland Kitchen MELATONIN PO   Oral   Take 2 tablets by mouth at bedtime.         . methylphenidate (RITALIN) 20 MG tablet   Oral   Take 20-30 mg by mouth 2 (two) times daily. Pt takes 30 mg in the morning and 20 mg at lunch.         . Naloxone HCl 0.4 MG/0.4ML SOAJ   Injection   Inject 1 Dose as directed every 10 (ten) minutes as needed (if patient is not breathing).          . ondansetron (ZOFRAN-ODT) 8 MG disintegrating tablet   Oral   Take 8 mg by mouth every 8 (eight) hours as needed for nausea or vomiting.         . OxyCODONE (OXYCONTIN) 40 mg T12A 12 hr tablet   Oral   Take 40 mg by mouth 3 (three) times daily.         . polyethylene glycol (MIRALAX / GLYCOLAX) packet   Oral   Take 17 g by mouth daily as needed for mild constipation.          . risperidone (RISPERDAL) 4 MG tablet   Oral   Take 2-4 mg by mouth 2 (two) times daily. Pt takes 4 mg in the morning and 2 mg at bedtime.         . topiramate (TOPAMAX) 200 MG tablet   Oral   Take 200 mg by mouth 2 (two) times daily. Pt takes with a 50 mg tablet.         . topiramate (TOPAMAX) 50 MG tablet   Oral   Take 50 mg by mouth 2 (two) times daily. Pt takes with a 200 mg tablet.         . Vitamin D, Ergocalciferol, (DRISDOL) 50000 UNITS CAPS capsule   Oral   Take 50,000 Units by mouth every 30 (thirty) days.           Allergies Dhea; Demerol; Floxin; Nsaids;  Nubain; Phenergan; Stadol; and Erythromycin  History reviewed. No pertinent family history.  Social History Social History  Substance Use Topics  . Smoking status: Current Every Day Smoker -- 0.50 packs/day for 16 years    Types: Cigarettes  . Smokeless tobacco: None  . Alcohol  Use: 0.0 oz/week    0 Standard drinks or equivalent per week     Comment: occasionally    Review of Systems Constitutional: No fever/chills Eyes: No visual changes. ENT: No sore throat. Cardiovascular: Denies chest pain. Respiratory: Denies shortness of breath. Gastrointestinal: No abdominal pain.  No nausea, no vomiting.  No diarrhea.  No constipation. Genitourinary: Negative for dysuria. Musculoskeletal: Negative for back pain. Skin: Negative for rash. Neurological: Negative for headaches, focal weakness or numbness.  10-point ROS otherwise negative.  ____________________________________________   PHYSICAL EXAM:  VITAL SIGNS: ED Triage Vitals  Enc Vitals Group     BP 10/03/15 2311 154/90 mmHg     Pulse Rate 10/03/15 2311 77     Resp 10/03/15 2311 20     Temp 10/03/15 2311 98.1 F (36.7 C)     Temp Source 10/03/15 2311 Oral     SpO2 10/03/15 2311 96 %     Weight 10/03/15 2311 175 lb (79.379 kg)     Height --      Head Cir --      Peak Flow --      Pain Score --      Pain Loc --      Pain Edu? --      Excl. in GC? --     Constitutional: Alert and oriented. Well appearing and in no acute distress.  Eyes: Conjunctivae are normal. PERRL. EOMI. Head: Atraumatic. Nose: No congestion/rhinnorhea. Mouth/Throat: Mucous membranes are moist.  Oropharynx non-erythematous. No exudate of the oropharynx. Neck: No stridor.  Hematological/Lymphatic/Immunilogical: No cervical lymphadenopathy. Cardiovascular: Normal rate, regular rhythm. Grossly normal heart sounds.  Good peripheral circulation. Respiratory: Normal respiratory effort.  No retractions. Lungs CTAB. Gastrointestinal: Soft and nontender. No distention.No CVA tenderness. Genitourinary: deferred Musculoskeletal: No lower extremity tenderness nor edema.  No joint effusions. +well-appearing HD catheter in the left upper chest without surrounding erythema or tenderness or swelling Neurologic:  Normal speech and language.  No gross focal neurologic deficits are appreciated. 5 out of 5 strength in bilateral upper and lower extremities, sensation intact to light touch throughout. Skin:  Skin is warm, dry and intact. No rash noted. Psychiatric: Mood and affect are normal. Speech and behavior are normal.  ____________________________________________   LABS (all labs ordered are listed, but only abnormal results are displayed)  Labs Reviewed  CBC WITH DIFFERENTIAL/PLATELET - Abnormal; Notable for the following:    WBC 11.2 (*)    Hemoglobin 11.4 (*)    RDW 17.9 (*)    Neutro Abs 7.3 (*)    All other components within normal limits  BASIC METABOLIC PANEL - Abnormal; Notable for the following:    Chloride 99 (*)    Glucose, Bld 108 (*)    BUN 98 (*)    Creatinine, Ser 8.69 (*)    Calcium 7.5 (*)    GFR calc non Af Amer 5 (*)    GFR calc Af Amer 6 (*)    All other components within normal limits  URINALYSIS COMPLETEWITH MICROSCOPIC ([facility] ONLY) - Abnormal; Notable for the following:    Color, Urine YELLOW (*)    APPearance CLEAR (*)    Hgb urine dipstick 1+ (*)  Protein, ur 100 (*)    Squamous Epithelial / LPF 0-5 (*)    All other components within normal limits   ____________________________________________  EKG  ED ECG REPORT I, Gayla Doss, the attending physician, personally viewed and interpreted this ECG.   Date: 10/03/2015  EKG Time: 23:12  Rate: 69  Rhythm: normal sinus rhythm  Axis: normal  Intervals:incomplete rbbb  ST&T Change: No acute ST elevation.EKG similar to 7/60/2016  ____________________________________________  RADIOLOGY  none ____________________________________________   PROCEDURES  Procedure(s) performed: None  Critical Care performed: No  ____________________________________________   INITIAL IMPRESSION / ASSESSMENT AND PLAN / ED COURSE  Pertinent labs & imaging results that were available during my care of the patient were reviewed by me and  considered in my medical decision making (see chart for details).  Erin [name] is a 44 y.o. female history of end-stage renal disease on dialysis, dialyzed Monday Wednesday Friday, legal blindness, chronic pain/fibromyalgia, panhypopituitarism, migraines, history of DVT, bipolar disorder, seizures who presents for evaluation of possible seizure which occurred tonight while she was on the couch. On arrival to the emergency department, she is awake, alert, oriented, intact neurological examination. She did not fall off the couch or sustain any injuries during the seizure. No tongue laceration, no incontinence. Vital signs stable, she is afebrile. Plan for screening labs, brief observation in the emergency department. We'll give her home dose of Topamax.she is having mild sore throat however centor criteria 0 out of 4. Reassess for disposition.  ----------------------------------------- 2:40 AM on 10/04/2015 -----------------------------------------  Labs reviewed and are notable for mild leukocytosis with white blood cell count 11.2 however this appears chronic on chart review. Mild anemia noted with hemoglobin 11.4. BMP shows normal potassium, creatinine elevated at 8.69 which is expected in the setting of end-stage renal disease/need for dialysis. Urinalysis is not consistent with infection. Patient observed in the ER for greater than 3 hours without recurrent seizure. She is alert, oriented. We'll discharge with return precautions and close PCP follow-up.  ----------------------------------------- 2:53 AM on 10/04/2015 -----------------------------------------  Patient requesting to sleep in the ER tonight to speak with a clinical social worker in the morning. She reports that it is her friend who is looking in on her, she no longer has an Holiday representative and she feels unsafe going home because she cannot handle self administration of her medications. Will consult social work and allow her  to wait in the emergency department until they're able to see her in the morning.  ----------------------------------------- 7:12 AM on 10/04/2015 -----------------------------------------  Patient has continued to sleep comfortably in the ER. Vital signs stable. Awaiting the arrival of social workers at 8 AM for reassessment. Care transferred to Dr. Carollee Massed. ____________________________________________   FINAL CLINICAL IMPRESSION(S) / ED DIAGNOSES  Final diagnoses:  Seizure (HCC)      Gayla Doss, MD 10/04/15 [phone]

## 2015-10-03 NOTE — ED Notes (Signed)
Pt from home, per ems caregiver witnessed 3 minutes of "twitching" activity like a seizure at 2200. Pt is alert to self, month, year. Pt moving all extremities.

## 2015-10-04 ENCOUNTER — Encounter: Payer: Self-pay | Admitting: Emergency Medicine

## 2015-10-04 DIAGNOSIS — R569 Unspecified convulsions: Secondary | ICD-10-CM | POA: Diagnosis not present

## 2015-10-04 LAB — CBC WITH DIFFERENTIAL/PLATELET
BASOS ABS: 0.1 10*3/uL (ref 0–0.1)
BASOS PCT: 1 %
EOS PCT: 3 %
Eosinophils Absolute: 0.3 10*3/uL (ref 0–0.7)
HCT: 35 % (ref 35.0–47.0)
Hemoglobin: 11.4 g/dL — ABNORMAL LOW (ref 12.0–16.0)
Lymphocytes Relative: 24 %
Lymphs Abs: 2.7 10*3/uL (ref 1.0–3.6)
MCH: 30 pg (ref 26.0–34.0)
MCHC: 32.6 g/dL (ref 32.0–36.0)
MCV: 92 fL (ref 80.0–100.0)
MONO ABS: 0.7 10*3/uL (ref 0.2–0.9)
MONOS PCT: 7 %
Neutro Abs: 7.3 10*3/uL — ABNORMAL HIGH (ref 1.4–6.5)
Neutrophils Relative %: 65 %
PLATELETS: 164 10*3/uL (ref 150–440)
RBC: 3.81 MIL/uL (ref 3.80–5.20)
RDW: 17.9 % — AB (ref 11.5–14.5)
WBC: 11.2 10*3/uL — ABNORMAL HIGH (ref 3.6–11.0)

## 2015-10-04 LAB — BASIC METABOLIC PANEL
ANION GAP: 13 (ref 5–15)
BUN: 98 mg/dL — ABNORMAL HIGH (ref 6–20)
CALCIUM: 7.5 mg/dL — AB (ref 8.9–10.3)
CO2: 26 mmol/L (ref 22–32)
Chloride: 99 mmol/L — ABNORMAL LOW (ref 101–111)
Creatinine, Ser: 8.69 mg/dL — ABNORMAL HIGH (ref 0.44–1.00)
GFR calc Af Amer: 6 mL/min — ABNORMAL LOW (ref >60)
GFR, EST NON AFRICAN AMERICAN: 5 mL/min — AB (ref >60)
GLUCOSE: 108 mg/dL — AB (ref 65–99)
Potassium: 3.8 mmol/L (ref 3.5–5.1)
Sodium: 138 mmol/L (ref 135–145)

## 2015-10-04 MED ORDER — RISPERIDONE 1 MG PO TABS: 4.0000 mg | ORAL_TABLET | Freq: Once | ORAL | Status: DC

## 2015-10-04 MED ORDER — RISPERIDONE 1 MG PO TABS
4.0000 mg | ORAL_TABLET | Freq: Every morning | ORAL | Status: DC
  Administered 2015-10-05: 4 mg via ORAL
  Filled 2015-10-04: qty 4

## 2015-10-04 MED ORDER — ALBUTEROL SULFATE (2.5 MG/3ML) 0.083% IN NEBU: 3.0000 mL | INHALATION_SOLUTION | Freq: Four times a day (QID) | RESPIRATORY_TRACT | Status: DC | PRN

## 2015-10-04 MED ORDER — LEVOTHYROXINE SODIUM 100 MCG PO TABS
100.0000 ug | ORAL_TABLET | Freq: Every day | ORAL | Status: DC
  Administered 2015-10-05: 100 ug via ORAL
  Filled 2015-10-04 (×3): qty 1

## 2015-10-04 MED ORDER — METHYLPHENIDATE HCL 10 MG PO TABS
30.0000 mg | ORAL_TABLET | Freq: Every day | ORAL | Status: DC
  Administered 2015-10-05: 30 mg via ORAL
  Filled 2015-10-04: qty 3

## 2015-10-04 MED ORDER — RISPERIDONE 1 MG PO TABS
2.0000 mg | ORAL_TABLET | Freq: Every day | ORAL | Status: DC
  Administered 2015-10-04: 2 mg via ORAL
  Filled 2015-10-04: qty 2

## 2015-10-04 MED ORDER — GABAPENTIN 100 MG PO CAPS
100.0000 mg | ORAL_CAPSULE | Freq: Every day | ORAL | Status: DC
  Administered 2015-10-04: 100 mg via ORAL
  Filled 2015-10-04 (×2): qty 1

## 2015-10-04 MED ORDER — HYDROXYZINE HCL 25 MG PO TABS
25.0000 mg | ORAL_TABLET | Freq: Three times a day (TID) | ORAL | Status: DC
  Administered 2015-10-05: 25 mg via ORAL
  Filled 2015-10-04 (×2): qty 1

## 2015-10-04 MED ORDER — METHYLPHENIDATE HCL 10 MG PO TABS: 20.0000 mg | ORAL_TABLET | Freq: Every day | ORAL | Status: DC

## 2015-10-04 MED ORDER — FENOFIBRATE 160 MG PO TABS
160.0000 mg | ORAL_TABLET | Freq: Every day | ORAL | Status: DC
  Administered 2015-10-04: 160 mg via ORAL
  Filled 2015-10-04 (×2): qty 1

## 2015-10-04 MED ORDER — RISPERIDONE 1 MG PO TABS
ORAL_TABLET | ORAL | Status: AC
  Filled 2015-10-04: qty 4

## 2015-10-04 MED ORDER — HYDROMORPHONE HCL 2 MG PO TABS: 4.0000 mg | ORAL_TABLET | Freq: Four times a day (QID) | ORAL | Status: DC | PRN

## 2015-10-04 MED ORDER — FLUDROCORTISONE ACETATE 0.1 MG PO TABS
0.1000 mg | ORAL_TABLET | Freq: Every day | ORAL | Status: DC
  Administered 2015-10-04 – 2015-10-05 (×2): 0.1 mg via ORAL
  Filled 2015-10-04 (×2): qty 1

## 2015-10-04 MED ORDER — FUROSEMIDE 40 MG PO TABS
80.0000 mg | ORAL_TABLET | Freq: Two times a day (BID) | ORAL | Status: DC
  Administered 2015-10-04 – 2015-10-05 (×2): 80 mg via ORAL
  Filled 2015-10-04 (×2): qty 2

## 2015-10-04 MED ORDER — CALCIUM ACETATE (PHOS BINDER) 667 MG PO CAPS
3335.0000 mg | ORAL_CAPSULE | Freq: Three times a day (TID) | ORAL | Status: DC
  Administered 2015-10-04: 3335 mg via ORAL
  Administered 2015-10-05: 2668 mg via ORAL
  Filled 2015-10-04 (×5): qty 5

## 2015-10-04 MED ORDER — HYDROMORPHONE HCL 1 MG/ML IJ SOLN
1.0000 mg | Freq: Once | INTRAMUSCULAR | Status: AC
  Administered 2015-10-04: 1 mg via INTRAVENOUS
  Filled 2015-10-04: qty 1

## 2015-10-04 MED ORDER — OXYCODONE HCL ER 10 MG PO T12A
40.0000 mg | EXTENDED_RELEASE_TABLET | Freq: Three times a day (TID) | ORAL | Status: DC
  Administered 2015-10-04 – 2015-10-05 (×2): 40 mg via ORAL
  Filled 2015-10-04: qty 4

## 2015-10-04 MED ORDER — BUDESONIDE-FORMOTEROL FUMARATE 80-4.5 MCG/ACT IN AERO
2.0000 | INHALATION_SPRAY | Freq: Two times a day (BID) | RESPIRATORY_TRACT | Status: DC
  Filled 2015-10-04: qty 6.9

## 2015-10-04 MED ORDER — HYDROMORPHONE HCL 2 MG PO TABS
4.0000 mg | ORAL_TABLET | Freq: Four times a day (QID) | ORAL | Status: DC | PRN
Start: 2015-10-04 — End: 2015-10-05
  Administered 2015-10-04: 4 mg via ORAL
  Filled 2015-10-04: qty 2

## 2015-10-04 MED ORDER — ONDANSETRON 8 MG PO TBDP: 8.0000 mg | ORAL_TABLET | Freq: Three times a day (TID) | ORAL | Status: DC | PRN

## 2015-10-04 MED ORDER — HYDROCORTISONE 10 MG PO TABS
10.0000 mg | ORAL_TABLET | Freq: Every day | ORAL | Status: DC
  Administered 2015-10-04 – 2015-10-05 (×2): 10 mg via ORAL
  Filled 2015-10-04 (×2): qty 1

## 2015-10-04 MED ORDER — TOPIRAMATE 25 MG PO TABS
250.0000 mg | ORAL_TABLET | Freq: Two times a day (BID) | ORAL | Status: DC
Start: 2015-10-04 — End: 2015-10-05
  Administered 2015-10-04: 250 mg via ORAL
  Filled 2015-10-04: qty 10

## 2015-10-04 MED ORDER — PRAVASTATIN SODIUM 40 MG PO TABS
20.0000 mg | ORAL_TABLET | Freq: Every day | ORAL | Status: DC
  Administered 2015-10-04: 20 mg via ORAL
  Filled 2015-10-04: qty 2

## 2015-10-04 MED ORDER — ASPIRIN 81 MG PO CHEW
81.0000 mg | CHEWABLE_TABLET | Freq: Every day | ORAL | Status: DC
  Filled 2015-10-04 (×3): qty 1

## 2015-10-04 MED ORDER — METHYLPHENIDATE HCL 10 MG PO TABS: 30.0000 mg | ORAL_TABLET | Freq: Every day | ORAL | Status: DC

## 2015-10-04 NOTE — ED Notes (Signed)
Medications identified and counted.  Stored and sent to pharmacy by [facility].  Verified with this nurse.

## 2015-10-04 NOTE — ED Notes (Signed)
This RN at bedside with Gavin Pound, CSW for consult. Pt awake and talking, pt alert and oriented, noted to be tearful on assessment.

## 2015-10-04 NOTE — ED Notes (Signed)
Pt c/o headache pain 7/10; nodding off to sleep while this nurse still at bedside; pt had said when entered room that she was sleepy

## 2015-10-04 NOTE — ED Notes (Signed)
Pharmacy to send up patient medications.

## 2015-10-04 NOTE — ED Notes (Signed)
Pt woken up by this RN and Elnita Maxwell, [facility]. Pt immediately became tearful with staff and began crying and sobbing. This RN called Gavin Pound, SW to let her know pt was awake for her consultation. Per Elnita Maxwell, pt was not the same as when she talked to her earlier in the week on the phone. Dr. Carollee Massed notified of change in patient behavior.
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
## 2015-10-04 NOTE — Progress Notes (Signed)
Visited with Ms Erin [name] for apprx 1 hr.  She is morning the death of her mother who passed 2 days before Thanksgiving.  She is totally blind and her mother looked after her.  She now feels hopeless, alone and like no one cares.  She is hoping to get into a group home soon.  Gave prescience, emotional & spiritual support and prayer. Chaplain Ronney Lion Ext 1200

## 2015-10-04 NOTE — ED Provider Notes (Signed)
-----------------------------------------   9:30 AM on 10/04/2015 -----------------------------------------  Accepted signout from Dr. Inocencio Homes at 7:30. Chart reviewed, discussed case with case management. Marcelino Duster will see the patient and see what additional services can be provided for this patient.  ----------------------------------------- 4:15 PM on 10/04/2015 -----------------------------------------  Patient has been stable soothe the day. She has been sleeping at times. She has been in no acute distress. She did miss morning meds but we have ordered her medications and will continue her on her usual meds.  There've been multiple and prolonged conversations with case management and social work. Gavin Pound has been working diligently to figure out a safe disposition for the patient. The patient does not feel safe or comfortable going home. Her mother, who is her primary caretaker. Died recently. There is no local family to help care for this patient who is blind and has other limitations. Gavin Pound has contacted aliments protective services. They are not able to calm help with an evaluation today. They will likely come tomorrow. Meanwhile we will keep the patient in the emergency department as we sort through a proper disposition for this patient.  I will ask Dr. Shaune Pollack to assume care of the patient in the emergency department at this time.  Darien Ramus, MD 10/04/15 [phone]

## 2015-10-04 NOTE — ED Provider Notes (Signed)
-----------------------------------------   7:57 PM on 10/04/2015 -----------------------------------------  Patient feeling anxious due to severe pain. She takes Dilaudid 4 mg tablet every 6 hours when necessary at home. We will give her her usual when necessary dose.  Awaiting social work follow-up for coordination of care  Sharman Cheek, MD 10/04/15 1958

## 2015-10-04 NOTE — ED Notes (Signed)
Pt responsive to verbal but otherwise extremely somnolent, patient nods off several times during conversation.  She states that her normal dilaudid dose is 6 mg and is requesting the other 2 mg.  States she wishes she had a cigarette as well.

## 2015-10-04 NOTE — ED Notes (Signed)
Pt took nighttime meds from pt's supply

## 2015-10-04 NOTE — ED Notes (Signed)
Chaplain sitting with patient at this time. NAD noted. Pt resting in bed. Pt requests graham crackers and coke at this time.

## 2015-10-05 DIAGNOSIS — R569 Unspecified convulsions: Secondary | ICD-10-CM | POA: Diagnosis not present

## 2015-10-05 MED ORDER — OXYCODONE HCL ER 10 MG PO T12A
EXTENDED_RELEASE_TABLET | ORAL | Status: AC
  Filled 2015-10-05: qty 4

## 2015-10-05 MED ORDER — NALOXONE HCL 2 MG/2ML IJ SOSY
PREFILLED_SYRINGE | INTRAMUSCULAR | Status: AC
  Filled 2015-10-05: qty 2

## 2015-10-05 MED ORDER — NALOXONE HCL 0.4 MG/ML IJ SOLN: 0.2000 mg | Freq: Once | INTRAMUSCULAR | Status: DC

## 2015-10-05 MED ORDER — TUBERCULIN PPD 5 UNIT/0.1ML ID SOLN
5.0000 [IU] | Freq: Once | INTRADERMAL | Status: DC
  Administered 2015-10-05: 5 [IU] via INTRADERMAL
  Filled 2015-10-05 (×2): qty 0.1

## 2015-10-05 NOTE — ED Notes (Signed)
Report given to Mary, RN.

## 2015-10-05 NOTE — ED Notes (Signed)
Resumed care from [facility] [place]. Ordered missing 0800 meds from pharmacy via [facility].

## 2015-10-05 NOTE — ED Provider Notes (Signed)
Patient is still pending placement via social work. Today is patients usual day for dialysis so I discussed  with nephrology Dr. Wyline Beady to attempt to arrange dialysis but he states admission is required for dialysis. Case manager tells me that I cannot admit the patient for dialysis.  ----------------------------------------- 9:55 AM on 10/05/2015 -----------------------------------------  Social work and case management have arranged for outpatient dialysis and then the patient will be placed from there because APS has been involved  Jene Every, MD 10/05/15 [phone]
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
## 2015-10-05 NOTE — ED Notes (Signed)
Pt asking if she will have dialysis at hospital. Discussed same with Dr. Zenda Alpers. Dr. Zenda Alpers to consult nephrology after 7am to see if pt qualifies to have dialysis at hospital today. Will continue to monitor.

## 2015-10-05 NOTE — NC FL2 (Signed)
[place] MEDICAID FL2 LEVEL OF CARE SCREENING TOOL     IDENTIFICATION  Patient Name: Erin [name] Birthdate: 04/19/1971 Sex: female Admission Date (Current Location): 10/03/2015  [place] and [place] Number: [facility] and Address:  [facility], [address] [place], [place] [address]      Provider Number: [id number]  Attending Physician Name and Address:  No att. providers found  Relative Name and Phone Number:       Current Level of Care: Hospital Recommended Level of Care: Family Care Home Prior Approval Number:    Date Approved/Denied:   PASRR Number:    Discharge Plan: Domiciliary (Rest home)    Current Diagnoses: There are no active problems to display for this patient.   Orientation ACTIVITIES/SOCIAL BLADDER RESPIRATION    Self, Time, Situation, Place  Active, Group participation, Family supportive Continent Normal  BEHAVIORAL SYMPTOMS/MOOD NEUROLOGICAL BOWEL NUTRITION STATUS    Convulsions/Seizures, Frequency (controlled with medication ) Continent Diet (low carb)  PHYSICIAN VISITS COMMUNICATION OF NEEDS Height & Weight Skin  30 days Verbally   175 lbs. Normal          AMBULATORY STATUS RESPIRATION    Supervision limited Normal      Personal Care Assistance Level of Assistance  Bathing, Feeding, Dressing Bathing Assistance: Limited assistance   Dressing Assistance: Limited assistance      Functional Limitations Info  Sight, Hearing, Speech Sight Info: Impaired (Total bilateral blindness) Hearing Info: Adequate Speech Info: Adequate       SPECIAL CARE FACTORS FREQUENCY                      Additional Factors Info  Psychotropic, Allergies               Current Medications (10/05/2015):  This is the current hospital active medication list Current Facility-Administered Medications  Medication Dose Route Frequency Provider Last Rate Last Dose  . albuterol (PROVENTIL) (2.5  MG/3ML) 0.083% nebulizer solution 3 mL  3 mL Inhalation Q6H PRN Darien Ramus, MD      . aspirin chewable tablet 81 mg  81 mg Oral Daily Darien Ramus, MD   0 mg at 10/04/15 2138  . budesonide-formoterol (SYMBICORT) 80-4.5 MCG/ACT inhaler 2 puff  2 puff Inhalation BID Darien Ramus, MD   2 puff at 10/04/15 2047  . calcium acetate (PHOSLO) capsule 3,335 mg  3,335 mg Oral TID WC Darien Ramus, MD   2,668 mg at 10/05/15 8119  . fenofibrate tablet 160 mg  160 mg Oral Daily Darien Ramus, MD   160 mg at 10/04/15 1535  . fludrocortisone (FLORINEF) tablet 0.1 mg  0.1 mg Oral Daily Darien Ramus, MD   0.1 mg at 10/05/15 1049  . furosemide (LASIX) tablet 80 mg  80 mg Oral BID Darien Ramus, MD   80 mg at 10/05/15 0754  . gabapentin (NEURONTIN) capsule 100 mg  100 mg Oral QHS Darien Ramus, MD   100 mg at 10/04/15 2137  . hydrocortisone (CORTEF) tablet 10 mg  10 mg Oral Daily Darien Ramus, MD   10 mg at 10/05/15 1048  . HYDROmorphone (DILAUDID) tablet 4 mg  4 mg Oral Q6H PRN Darien Ramus, MD   4 mg at 10/04/15 2051  . HYDROmorphone (DILAUDID) tablet 4 mg  4 mg Oral Q6H PRN Sharman Cheek, MD      . hydrOXYzine (ATARAX/VISTARIL) tablet 25 mg  25 mg Oral TID Darien Ramus, MD   25 mg at 10/05/15 1048  . levothyroxine (SYNTHROID, LEVOTHROID) tablet 100 mcg  100 mcg Oral QAC breakfast Darien Ramus, MD   100 mcg at 10/05/15 0045  . methylphenidate (RITALIN) tablet 20 mg  20 mg Oral Q1200 Darien Ramus, MD      . methylphenidate (RITALIN) tablet 30 mg  30 mg Oral Daily Darien Ramus, MD   30 mg at 10/05/15 1054  . naloxone [facility]) 2 MG/2ML injection           . naloxone [facility]) injection 0.2 mg  0.2 mg Intravenous Once Rebecka Apley, MD   0.2 mg at 10/05/15 9977  . ondansetron (ZOFRAN-ODT) disintegrating tablet 8 mg  8 mg Oral Q8H PRN Darien Ramus, MD      . oxyCODONE (OXYCONTIN) 10 mg 12 hr tablet           . oxyCODONE (OXYCONTIN) 12 hr tablet 40 mg  40 mg  Oral TID Darien Ramus, MD   40 mg at 10/05/15 1051  . pravastatin (PRAVACHOL) tablet 20 mg  20 mg Oral q1800 Darien Ramus, MD   20 mg at 10/04/15 1829  . risperiDONE (RISPERDAL) tablet 2 mg  2 mg Oral QHS Darien Ramus, MD   2 mg at 10/04/15 2135  . risperiDONE (RISPERDAL) tablet 4 mg  4 mg Oral q morning - 10a Darien Ramus, MD   4 mg at 10/05/15 1050  . topiramate (TOPAMAX) tablet 250 mg  250 mg Oral BID Darien Ramus, MD   250 mg at 10/04/15 2136  . tuberculin injection 5 Units  5 Units Intradermal Once Jene Every, MD       Current Outpatient Prescriptions  Medication Sig Dispense Refill  . albuterol (PROVENTIL HFA;VENTOLIN HFA) 108 (90 BASE) MCG/ACT inhaler Inhale 2 puffs into the lungs every 6 (six) hours as needed for wheezing or shortness of breath.    Marland Kitchen aspirin 81 MG chewable tablet Chew 81 mg by mouth daily.    . budesonide-formoterol (SYMBICORT) 80-4.5 MCG/ACT inhaler Inhale 2 puffs into the lungs 2 (two) times daily.    . calcium acetate (PHOSLO) 667 MG capsule Take 2,001-3,335 mg by mouth 5 (five) times daily. Pt takes five capsules with meals and three capsules with snacks.    . fenofibrate (TRICOR) 145 MG tablet Take 145 mg by mouth every other day.     . fludrocortisone (FLORINEF) 0.1 MG tablet Take 0.1 mg by mouth daily.    . folic acid-vitamin b complex-vitamin c-selenium-zinc (DIALYVITE) 3 MG TABS tablet Take 1 tablet by mouth daily.    . furosemide (LASIX) 80 MG tablet Take 80 mg by mouth 2 (two) times daily.    Marland Kitchen gabapentin (NEURONTIN) 100 MG capsule Take 400 mg by mouth at bedtime.    . hydrocortisone (CORTEF) 10 MG tablet Take 5-10 mg by mouth 2 (two) times daily. Pt takes 10 mg in the morning and 5 mg at bedtime.    Marland Kitchen HYDROmorphone (DILAUDID) 4 MG tablet Take 6 mg by mouth every 6 (six) hours as needed for severe pain.     . hydrOXYzine (ATARAX/VISTARIL) 25 MG tablet Take 25 mg by mouth 3 (three) times daily.    Marland Kitchen levothyroxine (SYNTHROID, LEVOTHROID)  112 MCG tablet Take 112 mcg by mouth daily.     Marland Kitchen lovastatin (MEVACOR) 40 MG tablet Take 20 mg by mouth every evening.     Marland Kitchen  MELATONIN PO Take 2 tablets by mouth at bedtime.    . methylphenidate (RITALIN) 20 MG tablet Take 20-30 mg by mouth 2 (two) times daily. Pt takes 30 mg in the morning and 20 mg at lunch.    . Naloxone HCl 0.4 MG/0.4ML SOAJ Inject 1 Dose as directed every 10 (ten) minutes as needed (if patient is not breathing).     . ondansetron (ZOFRAN-ODT) 8 MG disintegrating tablet Take 8 mg by mouth every 8 (eight) hours as needed for nausea or vomiting.    . OxyCODONE (OXYCONTIN) 40 mg T12A 12 hr tablet Take 40 mg by mouth 3 (three) times daily.    . polyethylene glycol (MIRALAX / GLYCOLAX) packet Take 17 g by mouth daily as needed for mild constipation.     . risperidone (RISPERDAL) 4 MG tablet Take 2-4 mg by mouth 2 (two) times daily. Pt takes 4 mg in the morning and 2 mg at bedtime.    . topiramate (TOPAMAX) 200 MG tablet Take 200 mg by mouth 2 (two) times daily. Pt takes with a 50 mg tablet.    . topiramate (TOPAMAX) 50 MG tablet Take 50 mg by mouth 2 (two) times daily. Pt takes with a 200 mg tablet.    . Vitamin D, Ergocalciferol, (DRISDOL) 50000 UNITS CAPS capsule Take 50,000 Units by mouth every 30 (thirty) days.       Discharge Medications: Please see discharge summary for a list of discharge medications.  Relevant Imaging Results:  Relevant Lab Results:  Recent Labs    Additional Information    Ned Card, LCSW

## 2015-10-05 NOTE — Care Management (Signed)
Informed this patient has been in the ED 30 hours wating on SW consult.  Informed SW has not seen but this is not the case.  Was seen by SW 12.4.2016.  Patient's primary caregiver was her mother who died approximately 2 week ago after falling and hitting her head the Tuesday before Thanksgiving.  Patient tried to talk her mother into seeking medical care but her mother refused.  Patient presented to the ED for "possible seizure."  Patient had medicaid pcs but her mother stopped them in October 2016.  Patient is blind.  She can not manage any of her adls / iadls.  She can not manage her meds.  She is followed at [facility] [place]  Today is her usual dialysis day and there is discussion of "admitting patient to the hospital so she can receive this treatment."  Spoke with Wilford Grist, CSW.  [facility] APS is involved and it was planned for this agency to assess patient today.  Discussed that agency needs to assess patient at her dialysis center  or upon arrival at her home.  Patient will be able to get into her house as her key is under her door mat.  Notified dialysis center that patient will be coming for her treatment.  [facility] relayed that pick up for today was cancelled and must be approved by medicaid.  Notified CSW.  Updated primary nurse and attending

## 2015-10-05 NOTE — ED Notes (Signed)
This RN to bedside to administer medication as ordered by MD. Pt awakens while this RN is attempting to scan identification bracelet. Pt asks what medication she is going to receive. Explained to pt that MD ordered narcan as she was exhibiting decreased respirations. Pt states, "I don't need that. Please don't give me that. I will stay awake." MD made aware of same. Medication NOT administered at this time. Provided pt with cola per her request. Will continue to monitor.

## 2015-10-05 NOTE — ED Notes (Signed)
Pt on telephone; alert & oriented with NAD noted.

## 2015-10-05 NOTE — NC FL2 (Signed)
[place] MEDICAID FL2 LEVEL OF CARE SCREENING TOOL     IDENTIFICATION  Patient Name: Erin [name] Birthdate: [birth year] Sex: female Admission Date (Current Location): 10/03/2015  [place] and [place] Number: [facility] and Address:  [facility], [address], [place], [place] [address]      Provider Number: [id number]  Attending Physician Name and Address:  No att. providers found  Relative Name and Phone Number:       Current Level of Care: Hospital Recommended Level of Care: Family Care Home Prior Approval Number:    Date Approved/Denied:   PASRR Number:    Discharge Plan: Domiciliary (Rest home)    Current Diagnoses: Past Medical History  Diagnosis Date  . Fibromyalgia   . Chronic headaches     Seeing Pain Management  . Hyperlipidemia   . Psychosis   . Hypothyroid   . Narcolepsy   . Asthma   . Constipation   . Kidney failure     Currently on Dialysis  . Blind   . Epilepsy (HCC)   . History of DVT (deep vein thrombosis)   . History of CVA (cerebrovascular accident)          Orientation ACTIVITIES/SOCIAL BLADDER RESPIRATION    Self, Time, Situation, Place  Active, Group participation, Family supportive Continent Normal  BEHAVIORAL SYMPTOMS/MOOD NEUROLOGICAL BOWEL NUTRITION STATUS    Convulsions/Seizures, Frequency (controlled with medication ) Continent Diet (low carb)  PHYSICIAN VISITS COMMUNICATION OF NEEDS Height & Weight Skin  30 days Verbally   175 lbs. Normal          AMBULATORY STATUS RESPIRATION    Supervision limited Normal      Personal Care Assistance Level of Assistance  Bathing, Feeding, Dressing Bathing Assistance: Limited assistance   Dressing Assistance: Limited assistance      Functional Limitations Info  Sight, Hearing, Speech Sight Info: Impaired (Total bilateral blindness) Hearing Info: Adequate Speech Info: Adequate       SPECIAL CARE FACTORS FREQUENCY                      Additional Factors Info  Psychotropic, Allergies               Current Medications (10/05/2015):  This is the current hospital active medication list Current Facility-Administered Medications  Medication Dose Route Frequency Provider Last Rate Last Dose  . albuterol (PROVENTIL) (2.5 MG/3ML) 0.083% nebulizer solution 3 mL  3 mL Inhalation Q6H PRN Darien Ramus, MD      . aspirin chewable tablet 81 mg  81 mg Oral Daily Darien Ramus, MD   0 mg at 10/04/15 2138  . budesonide-formoterol (SYMBICORT) 80-4.5 MCG/ACT inhaler 2 puff  2 puff Inhalation BID Darien Ramus, MD   2 puff at 10/04/15 2047  . calcium acetate (PHOSLO) capsule 3,335 mg  3,335 mg Oral TID WC Darien Ramus, MD   2,668 mg at 10/05/15 8119  . fenofibrate tablet 160 mg  160 mg Oral Daily Darien Ramus, MD   160 mg at 10/04/15 1535  . fludrocortisone (FLORINEF) tablet 0.1 mg  0.1 mg Oral Daily Darien Ramus, MD   0.1 mg at 10/05/15 1049  . furosemide (LASIX) tablet 80 mg  80 mg Oral BID Darien Ramus, MD   80 mg at 10/05/15 0754  . gabapentin (NEURONTIN) capsule 100 mg  100 mg Oral QHS Darien Ramus, MD   100 mg at  10/04/15 2137  . hydrocortisone (CORTEF) tablet 10 mg  10 mg Oral Daily Darien Ramus, MD   10 mg at 10/05/15 1048  . HYDROmorphone (DILAUDID) tablet 4 mg  4 mg Oral Q6H PRN Darien Ramus, MD   4 mg at 10/04/15 2051  . HYDROmorphone (DILAUDID) tablet 4 mg  4 mg Oral Q6H PRN Sharman Cheek, MD      . hydrOXYzine (ATARAX/VISTARIL) tablet 25 mg  25 mg Oral TID Darien Ramus, MD   25 mg at 10/05/15 1048  . levothyroxine (SYNTHROID, LEVOTHROID) tablet 100 mcg  100 mcg Oral QAC breakfast Darien Ramus, MD   100 mcg at 10/05/15 4098  . methylphenidate (RITALIN) tablet 20 mg  20 mg Oral Q1200 Darien Ramus, MD      . methylphenidate (RITALIN) tablet 30 mg  30 mg Oral Daily Darien Ramus, MD   30 mg at 10/05/15 1054  .  naloxone [facility]) 2 MG/2ML injection           . naloxone [facility]) injection 0.2 mg  0.2 mg Intravenous Once Rebecka Apley, MD   0.2 mg at 10/05/15 1191  . ondansetron (ZOFRAN-ODT) disintegrating tablet 8 mg  8 mg Oral Q8H PRN Darien Ramus, MD      . oxyCODONE (OXYCONTIN) 10 mg 12 hr tablet           . oxyCODONE (OXYCONTIN) 12 hr tablet 40 mg  40 mg Oral TID Darien Ramus, MD   40 mg at 10/05/15 1051  . pravastatin (PRAVACHOL) tablet 20 mg  20 mg Oral q1800 Darien Ramus, MD   20 mg at 10/04/15 1829  . risperiDONE (RISPERDAL) tablet 2 mg  2 mg Oral QHS Darien Ramus, MD   2 mg at 10/04/15 2135  . risperiDONE (RISPERDAL) tablet 4 mg  4 mg Oral q morning - 10a Darien Ramus, MD   4 mg at 10/05/15 1050  . topiramate (TOPAMAX) tablet 250 mg  250 mg Oral BID Darien Ramus, MD   250 mg at 10/04/15 2136  . tuberculin injection 5 Units  5 Units Intradermal Once Jene Every, MD   5 Units at 10/05/15 1121   Current Outpatient Prescriptions  Medication Sig Dispense Refill  . albuterol (PROVENTIL HFA;VENTOLIN HFA) 108 (90 BASE) MCG/ACT inhaler Inhale 2 puffs into the lungs every 6 (six) hours as needed for wheezing or shortness of breath.    Marland Kitchen aspirin 81 MG chewable tablet Chew 81 mg by mouth daily.    . budesonide-formoterol (SYMBICORT) 80-4.5 MCG/ACT inhaler Inhale 2 puffs into the lungs 2 (two) times daily.    . calcium acetate (PHOSLO) 667 MG capsule Take 2,001-3,335 mg by mouth 5 (five) times daily. Pt takes five capsules with meals and three capsules with snacks.    . fenofibrate (TRICOR) 145 MG tablet Take 145 mg by mouth every other day.     . fludrocortisone (FLORINEF) 0.1 MG tablet Take 0.1 mg by mouth daily.    . folic acid-vitamin b complex-vitamin c-selenium-zinc (DIALYVITE) 3 MG TABS tablet Take 1 tablet by mouth daily.    . furosemide (LASIX) 80 MG tablet Take 80 mg by mouth 2 (two) times daily.    Marland Kitchen gabapentin (NEURONTIN) 100 MG capsule Take 400 mg by mouth at  bedtime.    . hydrocortisone (CORTEF) 10 MG tablet Take 5-10 mg by mouth 2 (two) times daily. Pt takes 10 mg in the morning and 5 mg  at bedtime.    Marland Kitchen HYDROmorphone (DILAUDID) 4 MG tablet Take 6 mg by mouth every 6 (six) hours as needed for severe pain.     . hydrOXYzine (ATARAX/VISTARIL) 25 MG tablet Take 25 mg by mouth 3 (three) times daily.    Marland Kitchen levothyroxine (SYNTHROID, LEVOTHROID) 112 MCG tablet Take 112 mcg by mouth daily.     Marland Kitchen lovastatin (MEVACOR) 40 MG tablet Take 20 mg by mouth every evening.     Marland Kitchen MELATONIN PO Take 2 tablets by mouth at bedtime.    . methylphenidate (RITALIN) 20 MG tablet Take 20-30 mg by mouth 2 (two) times daily. Pt takes 30 mg in the morning and 20 mg at lunch.    . Naloxone HCl 0.4 MG/0.4ML SOAJ Inject 1 Dose as directed every 10 (ten) minutes as needed (if patient is not breathing).     . ondansetron (ZOFRAN-ODT) 8 MG disintegrating tablet Take 8 mg by mouth every 8 (eight) hours as needed for nausea or vomiting.    . OxyCODONE (OXYCONTIN) 40 mg T12A 12 hr tablet Take 40 mg by mouth 3 (three) times daily.    . polyethylene glycol (MIRALAX / GLYCOLAX) packet Take 17 g by mouth daily as needed for mild constipation.     . risperidone (RISPERDAL) 4 MG tablet Take 2-4 mg by mouth 2 (two) times daily. Pt takes 4 mg in the morning and 2 mg at bedtime.    . topiramate (TOPAMAX) 200 MG tablet Take 200 mg by mouth 2 (two) times daily. Pt takes with a 50 mg tablet.    . topiramate (TOPAMAX) 50 MG tablet Take 50 mg by mouth 2 (two) times daily. Pt takes with a 200 mg tablet.    . Vitamin D, Ergocalciferol, (DRISDOL) 50000 UNITS CAPS capsule Take 50,000 Units by mouth every 30 (thirty) days.       Discharge Medications:   Current Outpatient Prescriptions  Medication Sig Dispense Refill  . albuterol (PROVENTIL HFA;VENTOLIN HFA) 108 (90 BASE) MCG/ACT inhaler Inhale 2 puffs into the lungs every 6 (six) hours as needed for wheezing or shortness of breath.    Marland Kitchen aspirin 81 MG  chewable tablet Chew 81 mg by mouth daily.    . budesonide-formoterol (SYMBICORT) 80-4.5 MCG/ACT inhaler Inhale 2 puffs into the lungs 2 (two) times daily.    . calcium acetate (PHOSLO) 667 MG capsule Take 2,001-3,335 mg by mouth 5 (five) times daily. Pt takes five capsules with meals and three capsules with snacks.    . fenofibrate (TRICOR) 145 MG tablet Take 145 mg by mouth every other day.     . fludrocortisone (FLORINEF) 0.1 MG tablet Take 0.1 mg by mouth daily.    . folic acid-vitamin b complex-vitamin c-selenium-zinc (DIALYVITE) 3 MG TABS tablet Take 1 tablet by mouth daily.    . furosemide (LASIX) 80 MG tablet Take 80 mg by mouth 2 (two) times daily.    Marland Kitchen gabapentin (NEURONTIN) 100 MG capsule Take 400 mg by mouth at bedtime.    . hydrocortisone (CORTEF) 10 MG tablet Take 5-10 mg by mouth 2 (two) times daily. Pt takes 10 mg in the morning and 5 mg at bedtime.    Marland Kitchen HYDROmorphone (DILAUDID) 4 MG tablet Take 6 mg by mouth every 6 (six) hours as needed for severe pain.     . hydrOXYzine (ATARAX/VISTARIL) 25 MG tablet Take 25 mg by mouth 3 (three) times daily.    Marland Kitchen levothyroxine (SYNTHROID, LEVOTHROID) 112 MCG tablet Take 112 mcg  by mouth daily.     Marland Kitchen lovastatin (MEVACOR) 40 MG tablet Take 20 mg by mouth every evening.     Marland Kitchen MELATONIN PO Take 2 tablets by mouth at bedtime.    . methylphenidate (RITALIN) 20 MG tablet Take 20-30 mg by mouth 2 (two) times daily. Pt takes 30 mg in the morning and 20 mg at lunch.    . Naloxone HCl 0.4 MG/0.4ML SOAJ Inject 1 Dose as directed every 10 (ten) minutes as needed (if patient is not breathing).     . ondansetron (ZOFRAN-ODT) 8 MG disintegrating tablet Take 8 mg by mouth every 8 (eight) hours as needed for nausea or vomiting.    . OxyCODONE (OXYCONTIN) 40 mg T12A 12 hr tablet Take 40 mg by mouth 3 (three) times daily.    . polyethylene glycol (MIRALAX / GLYCOLAX) packet Take 17 g by mouth daily as needed for mild constipation.     . risperidone (RISPERDAL) 4  MG tablet Take 2-4 mg by mouth 2 (two) times daily. Pt takes 4 mg in the morning and 2 mg at bedtime.    . topiramate (TOPAMAX) 200 MG tablet Take 200 mg by mouth 2 (two) times daily. Pt takes with a 50 mg tablet.    . topiramate (TOPAMAX) 50 MG tablet Take 50 mg by mouth 2 (two) times daily. Pt takes with a 200 mg tablet.    . Vitamin D, Ergocalciferol, (DRISDOL) 50000 UNITS CAPS capsule Take 50,000 Units by mouth every 30 (thirty) days.     Relevant Imaging Results:  Relevant Lab Results:  Recent Labs    Additional Information HD M, W, F 11:00   PPD placed on 10/05/15 to be read 10/07/15  Ned Card, LCSW

## 2015-10-05 NOTE — ED Notes (Signed)
Report received from [place], [place]. Care of pt assumed at this time.

## 2015-10-05 NOTE — ED Notes (Signed)
Pt is sleeping soundly at this time. Respirations are deep and unlabored but decreased in number/minute. MD made aware. Order placed for narcan. Order to be carried out by this RN.

## 2015-10-05 NOTE — Progress Notes (Signed)
CSW followed up with Pt to assist with psychosocial needs. CSW met with [employer] APS workers after they assessed Pt in the ED. They are seeking placement for Pt at this time. Pt will go to outpatient HD in [place] today for a full treatment. APS will follow up with Pt at HD. Pt's friends have been contacting Pt in the ED and Pt currently has a friend Beth at bedside who is able to support Pt as needed while she is here. CSW provided APS with FL2 and PPD was ordered to assist with immediate placement from the community. CSW advised APS that Pt will need a follow up visit with her PCP for follow up after ED visit. CSW contacted medicaid transportation and they will send [facility] to ER to provide transportation to HD center.   CSW will follow up with HD center CSW as well to let them know of immediate changes and give them DSS workers' contact.   DSS worker is Scott [phone]  Toma Copier, [place] [phone]

## 2015-10-13 ENCOUNTER — Emergency Department
Admission: EM | Admit: 2015-10-13 | Discharge: 2015-10-13 | Disposition: A | Discharge status: Home / Self Care | Payer: Medicare Other | Attending: Emergency Medicine | Admitting: Emergency Medicine

## 2015-10-13 ENCOUNTER — Encounter: Payer: Self-pay | Admitting: Emergency Medicine

## 2015-10-13 ENCOUNTER — Emergency Department: Payer: Medicare Other

## 2015-10-13 DIAGNOSIS — R109 Unspecified abdominal pain: Secondary | ICD-10-CM | POA: Diagnosis not present

## 2015-10-13 DIAGNOSIS — Z7982 Long term (current) use of aspirin: Secondary | ICD-10-CM | POA: Insufficient documentation

## 2015-10-13 DIAGNOSIS — M549 Dorsalgia, unspecified: Secondary | ICD-10-CM | POA: Diagnosis present

## 2015-10-13 DIAGNOSIS — R300 Dysuria: Secondary | ICD-10-CM | POA: Insufficient documentation

## 2015-10-13 DIAGNOSIS — M545 Low back pain, unspecified: Secondary | ICD-10-CM

## 2015-10-13 DIAGNOSIS — Z7951 Long term (current) use of inhaled steroids: Secondary | ICD-10-CM | POA: Insufficient documentation

## 2015-10-13 DIAGNOSIS — Z79899 Other long term (current) drug therapy: Secondary | ICD-10-CM | POA: Diagnosis not present

## 2015-10-13 DIAGNOSIS — F17210 Nicotine dependence, cigarettes, uncomplicated: Secondary | ICD-10-CM | POA: Insufficient documentation

## 2015-10-13 DIAGNOSIS — Z79891 Long term (current) use of opiate analgesic: Secondary | ICD-10-CM | POA: Insufficient documentation

## 2015-10-13 LAB — CBC
HEMATOCRIT: 36 % (ref 35.0–47.0)
Hemoglobin: 11.8 g/dL — ABNORMAL LOW (ref 12.0–16.0)
MCH: 30.8 pg (ref 26.0–34.0)
MCHC: 32.8 g/dL (ref 32.0–36.0)
MCV: 93.9 fL (ref 80.0–100.0)
Platelets: 204 10*3/uL (ref 150–440)
RBC: 3.83 MIL/uL (ref 3.80–5.20)
RDW: 18.1 % — AB (ref 11.5–14.5)
WBC: 11.9 10*3/uL — AB (ref 3.6–11.0)

## 2015-10-13 LAB — URINALYSIS COMPLETE WITH MICROSCOPIC (ARMC ONLY)
BACTERIA UA: NONE SEEN
Bilirubin Urine: NEGATIVE
Glucose, UA: NEGATIVE mg/dL
HGB URINE DIPSTICK: NEGATIVE
Ketones, ur: NEGATIVE mg/dL
LEUKOCYTES UA: NEGATIVE
Nitrite: NEGATIVE
PH: 8 (ref 5.0–8.0)
PROTEIN: 100 mg/dL — AB
Specific Gravity, Urine: 1.004 — ABNORMAL LOW (ref 1.005–1.030)

## 2015-10-13 LAB — BASIC METABOLIC PANEL
ANION GAP: 12 (ref 5–15)
BUN: 60 mg/dL — ABNORMAL HIGH (ref 6–20)
CALCIUM: 9.3 mg/dL (ref 8.9–10.3)
CO2: 27 mmol/L (ref 22–32)
Chloride: 95 mmol/L — ABNORMAL LOW (ref 101–111)
Creatinine, Ser: 7.32 mg/dL — ABNORMAL HIGH (ref 0.44–1.00)
GFR calc Af Amer: 7 mL/min — ABNORMAL LOW (ref >60)
GFR calc non Af Amer: 6 mL/min — ABNORMAL LOW (ref >60)
GLUCOSE: 81 mg/dL (ref 65–99)
POTASSIUM: 4.8 mmol/L (ref 3.5–5.1)
Sodium: 134 mmol/L — ABNORMAL LOW (ref 135–145)

## 2015-10-13 MED ORDER — OXYCODONE-ACETAMINOPHEN 5-325 MG PO TABS
1.0000 | ORAL_TABLET | Freq: Once | ORAL | Status: AC
  Administered 2015-10-13: 1 via ORAL
  Filled 2015-10-13: qty 1

## 2015-10-13 NOTE — ED Provider Notes (Signed)
[facility] Emergency Department Provider Note  ____________________________________________  Time seen: Approximately 720 PM  I have reviewed the triage vital signs and the nursing notes.   HISTORY  Chief Complaint Back Pain    HPI Erin [name] is a 44 y.o. female history of end-stage renal disease and kidney stones who is presenting with right flank pain. She said that the pain started suddenly at about 2 PM today. She says that it is not worse with movement. She says it is sharp. She says it is moderate right now. She also says that she has had some burning with urination. She says that it feels similarly to when she had a kidney stone in the past. She says she was last dialyzed one day ago. Denies any nausea or vomiting or diarrhea. Denies any abdominal pain.Did not report any loss of bowel or bladder continence or weakness.   Past Medical History  Diagnosis Date  . Fibromyalgia   . Chronic headaches     Seeing Pain Management  . Hyperlipidemia   . Psychosis   . Hypothyroid   . Narcolepsy   . Asthma   . Constipation   . Kidney failure     Currently on Dialysis  . Blind   . Epilepsy (HCC)   . History of DVT (deep vein thrombosis)   . History of CVA (cerebrovascular accident)   . Depression   . Bipolar affect, depressed (HCC)   . Anxiety     There are no active problems to display for this patient.   Past Surgical History  Procedure Laterality Date  . Brain surgery  1976  . Cholecystectomy  2001  . Breast lumpectomy  2001  . Pituitary excision  1976  . Dg av dialysis graft declot or      X 4  . Peripheral vascular catheterization N/A 08/21/2015    Procedure: Dialysis/Perma Catheter Insertion;  Surgeon: Renford Dills, MD;  Location: [facility] INVASIVE CV LAB;  Service: Cardiovascular;  Laterality: N/A;    Current Outpatient Rx  Name  Route  Sig  Dispense  Refill  . albuterol (PROVENTIL HFA;VENTOLIN HFA) 108 (90 BASE) MCG/ACT  inhaler   Inhalation   Inhale 2 puffs into the lungs every 6 (six) hours as needed for wheezing or shortness of breath.         Marland Kitchen aspirin 81 MG chewable tablet   Oral   Chew 81 mg by mouth daily.         . budesonide-formoterol (SYMBICORT) 80-4.5 MCG/ACT inhaler   Inhalation   Inhale 2 puffs into the lungs 2 (two) times daily.         . calcium acetate (PHOSLO) 667 MG capsule   Oral   Take 2,001-3,335 mg by mouth 5 (five) times daily. Pt takes five capsules with meals and three capsules with snacks.         . fenofibrate (TRICOR) 145 MG tablet   Oral   Take 145 mg by mouth every other day.          . fludrocortisone (FLORINEF) 0.1 MG tablet   Oral   Take 0.1 mg by mouth daily.         . folic acid-vitamin b complex-vitamin c-selenium-zinc (DIALYVITE) 3 MG TABS tablet   Oral   Take 1 tablet by mouth daily.         . furosemide (LASIX) 80 MG tablet   Oral   Take 80 mg by mouth 2 (two) times  daily.         . gabapentin (NEURONTIN) 100 MG capsule   Oral   Take 400 mg by mouth at bedtime.         . hydrocortisone (CORTEF) 10 MG tablet   Oral   Take 5-10 mg by mouth 2 (two) times daily. Pt takes 10 mg in the morning and 5 mg at bedtime.         Marland Kitchen HYDROmorphone (DILAUDID) 4 MG tablet   Oral   Take 6 mg by mouth every 6 (six) hours as needed for severe pain.          . hydrOXYzine (ATARAX/VISTARIL) 25 MG tablet   Oral   Take 25 mg by mouth 3 (three) times daily.         Marland Kitchen levothyroxine (SYNTHROID, LEVOTHROID) 112 MCG tablet   Oral   Take 112 mcg by mouth daily.          Marland Kitchen lovastatin (MEVACOR) 40 MG tablet   Oral   Take 20 mg by mouth every evening.          Marland Kitchen MELATONIN PO   Oral   Take 2 tablets by mouth at bedtime.         . methylphenidate (RITALIN) 20 MG tablet   Oral   Take 20-30 mg by mouth 2 (two) times daily. Pt takes 30 mg in the morning and 20 mg at lunch.         . Naloxone HCl 0.4 MG/0.4ML SOAJ   Injection   Inject 1  Dose as directed every 10 (ten) minutes as needed (if patient is not breathing).          . ondansetron (ZOFRAN-ODT) 8 MG disintegrating tablet   Oral   Take 8 mg by mouth every 8 (eight) hours as needed for nausea or vomiting.         . OxyCODONE (OXYCONTIN) 40 mg T12A 12 hr tablet   Oral   Take 40 mg by mouth 3 (three) times daily.         . polyethylene glycol (MIRALAX / GLYCOLAX) packet   Oral   Take 17 g by mouth daily as needed for mild constipation.          . risperidone (RISPERDAL) 4 MG tablet   Oral   Take 2-4 mg by mouth 2 (two) times daily. Pt takes 4 mg in the morning and 2 mg at bedtime.         . topiramate (TOPAMAX) 200 MG tablet   Oral   Take 200 mg by mouth 2 (two) times daily. Pt takes with a 50 mg tablet.         . topiramate (TOPAMAX) 50 MG tablet   Oral   Take 50 mg by mouth 2 (two) times daily. Pt takes with a 200 mg tablet.         . Vitamin D, Ergocalciferol, (DRISDOL) 50000 UNITS CAPS capsule   Oral   Take 50,000 Units by mouth every 30 (thirty) days.           Allergies Dhea; Demerol; Floxin; Nsaids; Nubain; Phenergan; Stadol; and Erythromycin  No family history on file.  Social History Social History  Substance Use Topics  . Smoking status: Current Every Day Smoker -- 0.50 packs/day for 16 years    Types: Cigarettes  . Smokeless tobacco: None  . Alcohol Use: 0.0 oz/week    0 Standard drinks or equivalent per week  Comment: occasionally    Review of Systems Constitutional: No fever/chills Eyes: No visual changes. ENT: No sore throat. Cardiovascular: Denies chest pain. Respiratory: Denies shortness of breath. Gastrointestinal: No abdominal pain.  No nausea, no vomiting.  No diarrhea.  No constipation. Genitourinary: Burning with urination. No frequency.  Musculoskeletal: Negative for back pain. Skin: Negative for rash. Neurological: Negative for headaches, focal weakness or numbness.  10-point ROS otherwise  negative.  ____________________________________________   PHYSICAL EXAM:  VITAL SIGNS: ED Triage Vitals  Enc Vitals Group     BP 10/13/15 1550 165/91 mmHg     Pulse Rate 10/13/15 1550 71     Resp 10/13/15 1550 18     Temp 10/13/15 1550 98.5 F (36.9 C)     Temp src --      SpO2 10/13/15 1550 97 %     Weight 10/13/15 1550 175 lb (79.379 kg)     Height 10/13/15 1550  (1.473 m)     Head Cir --      Peak Flow --      Pain Score 10/13/15 1552 6     Pain Loc --      Pain Edu? --      Excl. in GC? --     Constitutional: Alert and oriented. Well appearing and in no acute distress. Resting comfortably on the stretcher. Eyes: Conjunctivae are normal. PERRL. EOMI. Head: Atraumatic. Nose: No congestion/rhinnorhea. Mouth/Throat: Mucous membranes are moist.  Oropharynx non-erythematous. Neck: No stridor.   Cardiovascular: Normal rate, regular rhythm. Grossly normal heart sounds.  Good peripheral circulation. Left-sided chest permacath clean dry and intact. No tenderness or induration or erythema surrounding it. Respiratory: Normal respiratory effort.  No retractions. Lungs CTAB. Gastrointestinal: Soft and nontender. No distention. No abdominal bruits. No CVA tenderness. Musculoskeletal: No lower extremity tenderness nor edema.  No joint effusions. Neurologic:  Normal speech and language. No gross focal neurologic deficits are appreciated. No gait instability. Skin:  Skin is warm, dry and intact. No rash noted. Psychiatric: Mood and affect are normal. Speech and behavior are normal.  ____________________________________________   LABS (all labs ordered are listed, but only abnormal results are displayed)  Labs Reviewed  CBC - Abnormal; Notable for the following:    WBC 11.9 (*)    Hemoglobin 11.8 (*)    RDW 18.1 (*)    All other components within normal limits  BASIC METABOLIC PANEL - Abnormal; Notable for the following:    Sodium 134 (*)    Chloride 95 (*)    BUN 60 (*)     Creatinine, Ser 7.32 (*)    GFR calc non Af Amer 6 (*)    GFR calc Af Amer 7 (*)    All other components within normal limits  URINALYSIS COMPLETEWITH MICROSCOPIC ([facility] ONLY) - Abnormal; Notable for the following:    Color, Urine STRAW (*)    APPearance CLEAR (*)    Specific Gravity, Urine 1.004 (*)    Protein, ur 100 (*)    Squamous Epithelial / LPF 0-5 (*)    All other components within normal limits   ____________________________________________  EKG   ____________________________________________  RADIOLOGY  No evidence of hydronephrosis. Renal atrophy. ____________________________________________   PROCEDURES   ____________________________________________   INITIAL IMPRESSION / ASSESSMENT AND PLAN / ED COURSE  Pertinent labs & imaging results that were available during my care of the patient were reviewed by me and considered in my medical decision making (see chart for details).  ----------------------------------------- 10:01  PM on 10/13/2015 -----------------------------------------  Patient continues to rest comfortably on the whole stretcher without any signs of distress. Updated her about her labs and imaging. Unclear cause of her flank pain however unlikely large stone. No pain at this time. Will be discharged home. We'll give her urology follow-up. ____________________________________________   FINAL CLINICAL IMPRESSION(S) / ED DIAGNOSES  Acute flank pain and back pain. Initial visit.    Myrna Blazer, MD 10/13/15 [phone]

## 2015-10-13 NOTE — ED Notes (Signed)
Patient waiting for transport.

## 2015-10-13 NOTE — ED Notes (Signed)
Patient left for Ultrasound.

## 2015-10-13 NOTE — ED Notes (Addendum)
C/o back pain.  Onset of symptoms approximately 1 hour PTA. C/o right mid back pain.  Pain worsens "a little" with coughing and movement.  Patient states pain does not feel like a muscle, it hurts inside.    Patient has history of chronic pain and fibromyalgia.  States Dilaudid 6 mg PO at 1400 and pain somewhat better.  Patient is currently a resident of [facility].

## 2015-10-13 NOTE — ED Notes (Signed)
Patient given ginger ale and graham crackers per Dr. Pershing Proud.

## 2015-10-13 NOTE — Discharge Instructions (Signed)

## 2015-10-23 ENCOUNTER — Emergency Department
Admission: EM | Admit: 2015-10-23 | Discharge: 2015-10-23 | Disposition: A | Discharge status: Home / Self Care | Payer: Medicare Other | Attending: Emergency Medicine | Admitting: Emergency Medicine

## 2015-10-23 ENCOUNTER — Emergency Department: Payer: Medicare Other

## 2015-10-23 DIAGNOSIS — Z7951 Long term (current) use of inhaled steroids: Secondary | ICD-10-CM | POA: Insufficient documentation

## 2015-10-23 DIAGNOSIS — G8929 Other chronic pain: Secondary | ICD-10-CM | POA: Insufficient documentation

## 2015-10-23 DIAGNOSIS — R51 Headache: Secondary | ICD-10-CM | POA: Insufficient documentation

## 2015-10-23 DIAGNOSIS — Z79899 Other long term (current) drug therapy: Secondary | ICD-10-CM | POA: Insufficient documentation

## 2015-10-23 DIAGNOSIS — Z87448 Personal history of other diseases of urinary system: Secondary | ICD-10-CM | POA: Insufficient documentation

## 2015-10-23 DIAGNOSIS — F17210 Nicotine dependence, cigarettes, uncomplicated: Secondary | ICD-10-CM | POA: Insufficient documentation

## 2015-10-23 DIAGNOSIS — Z7982 Long term (current) use of aspirin: Secondary | ICD-10-CM | POA: Insufficient documentation

## 2015-10-23 DIAGNOSIS — R531 Weakness: Secondary | ICD-10-CM | POA: Diagnosis present

## 2015-10-23 LAB — COMPREHENSIVE METABOLIC PANEL
ALBUMIN: 3.1 g/dL — AB (ref 3.5–5.0)
ALT: 22 U/L (ref 14–54)
ANION GAP: 6 (ref 5–15)
AST: 48 U/L — ABNORMAL HIGH (ref 15–41)
Alkaline Phosphatase: 65 U/L (ref 38–126)
BILIRUBIN TOTAL: 0.8 mg/dL (ref 0.3–1.2)
BUN: 21 mg/dL — ABNORMAL HIGH (ref 6–20)
CHLORIDE: 98 mmol/L — AB (ref 101–111)
CO2: 32 mmol/L (ref 22–32)
Calcium: 9.1 mg/dL (ref 8.9–10.3)
Creatinine, Ser: 3.97 mg/dL — ABNORMAL HIGH (ref 0.44–1.00)
GFR calc Af Amer: 15 mL/min — ABNORMAL LOW (ref >60)
GFR, EST NON AFRICAN AMERICAN: 13 mL/min — AB (ref >60)
Glucose, Bld: 88 mg/dL (ref 65–99)
POTASSIUM: 3.3 mmol/L — AB (ref 3.5–5.1)
Sodium: 136 mmol/L (ref 135–145)
TOTAL PROTEIN: 6.2 g/dL — AB (ref 6.5–8.1)

## 2015-10-23 LAB — CBC WITH DIFFERENTIAL/PLATELET
Basophils Absolute: 0.1 10*3/uL (ref 0–0.1)
Basophils Relative: 1 %
Eosinophils Absolute: 0.1 10*3/uL (ref 0–0.7)
Eosinophils Relative: 1 %
HEMATOCRIT: 32.2 % — AB (ref 35.0–47.0)
Hemoglobin: 10.6 g/dL — ABNORMAL LOW (ref 12.0–16.0)
LYMPHS ABS: 1 10*3/uL (ref 1.0–3.6)
LYMPHS PCT: 10 %
MCH: 30.3 pg (ref 26.0–34.0)
MCHC: 33 g/dL (ref 32.0–36.0)
MCV: 92 fL (ref 80.0–100.0)
MONO ABS: 0.5 10*3/uL (ref 0.2–0.9)
MONOS PCT: 5 %
NEUTROS ABS: 8.6 10*3/uL — AB (ref 1.4–6.5)
Neutrophils Relative %: 83 %
PLATELETS: 223 10*3/uL (ref 150–440)
RBC: 3.5 MIL/uL — ABNORMAL LOW (ref 3.80–5.20)
RDW: 17.3 % — AB (ref 11.5–14.5)
WBC: 10.3 10*3/uL (ref 3.6–11.0)

## 2015-10-23 LAB — MAGNESIUM: MAGNESIUM: 1.8 mg/dL (ref 1.7–2.4)

## 2015-10-23 MED ORDER — HYDROMORPHONE HCL 2 MG PO TABS
4.0000 mg | ORAL_TABLET | Freq: Once | ORAL | Status: AC
  Administered 2015-10-23: 4 mg via ORAL
  Filled 2015-10-23: qty 2

## 2015-10-23 NOTE — Discharge Instructions (Signed)
General Headache Without Cause °A headache is pain or discomfort felt around the head or neck area. The specific cause of a headache may not be found. There are many causes and types of headaches. A few common ones are: °· Tension headaches. °· Migraine headaches. °· Cluster headaches. °· Chronic daily headaches. °HOME CARE INSTRUCTIONS  °Watch your condition for any changes. Take these steps to help with your condition: °Managing Pain °· Take over-the-counter and prescription medicines only as told by your health care provider. °· Lie down in a dark, quiet room when you have a headache. °· If directed, apply ice to the head and neck area: °¨ Put ice in a plastic bag. °¨ Place a towel between your skin and the bag. °¨ Leave the ice on for 20 minutes, 2-3 times per day. °· Use a heating pad or hot shower to apply heat to the head and neck area as told by your health care provider. °· Keep lights dim if bright lights bother you or make your headaches worse. °Eating and Drinking °· Eat meals on a regular schedule. °· Limit alcohol use. °· Decrease the amount of caffeine you drink, or stop drinking caffeine. °General Instructions °· Keep all follow-up visits as told by your health care provider. This is important. °· Keep a headache journal to help find out what may trigger your headaches. For example, write down: °¨ What you eat and drink. °¨ How much sleep you get. °¨ Any change to your diet or medicines. °· Try massage or other relaxation techniques. °· Limit stress. °· Sit up straight, and do not tense your muscles. °· Do not use tobacco products, including cigarettes, chewing tobacco, or e-cigarettes. If you need help quitting, ask your health care provider. °· Exercise regularly as told by your health care provider. °· Sleep on a regular schedule. Get 7-9 hours of sleep, or the amount recommended by your health care provider. °SEEK MEDICAL CARE IF:  °· Your symptoms are not helped by medicine. °· You have a  headache that is different from the usual headache. °· You have nausea or you vomit. °· You have a fever. °SEEK IMMEDIATE MEDICAL CARE IF:  °· Your headache becomes severe. °· You have repeated vomiting. °· You have a stiff neck. °· You have a loss of vision. °· You have problems with speech. °· You have pain in the eye or ear. °· You have muscular weakness or loss of muscle control. °· You lose your balance or have trouble walking. °· You feel faint or pass out. °· You have confusion. °  °This information is not intended to replace advice given to you by your health care provider. Make sure you discuss any questions you have with your health care provider. °  °Document Released: 10/17/2005 Document Revised: 07/08/2015 Document Reviewed: 02/09/2015 °Elsevier Interactive Patient Education ©2016 [employer] ° °Please return immediately if condition worsens. Please contact her primary physician or the physician you were given for referral. If you have any specialist physicians involved in her treatment and plan please also contact them. Thank you for using [facility]. ° °

## 2015-10-23 NOTE — ED Provider Notes (Signed)
Time Seen: Approximately 1410  I have reviewed the triage notes  Chief Complaint: Weakness   History of Present Illness: Erin [name] is a 44 y.o. female *who is on now on renal dialysis schedule for Monday Wednesday Friday. Patient has a history of multiple medical problems including chronic headaches with the pain management. Patient apparently is also developed a recent history of seizure disorder after her mother died at Thanksgiving. She states she was referred here after her dialysis treatment for evaluation of her headache and possible change in her seizures. Patient's very vague on her description of the change in her seizures. The patient states that she has historically had generalized seizures. Her drying current weight is 81 kg and she states that they did not take any additional fluid off at this particular dialysis. He states he's had headaches before after dialysis and points mainly to the frontal region. She denies any associated nausea, vomiting, fever or neck pain. She describes some generalized weakness. Past Medical History  Diagnosis Date  . Fibromyalgia   . Chronic headaches     Seeing Pain Management  . Hyperlipidemia   . Psychosis   . Hypothyroid   . Narcolepsy   . Asthma   . Constipation   . Kidney failure     Currently on Dialysis  . Blind   . Epilepsy (HCC)   . History of DVT (deep vein thrombosis)   . History of CVA (cerebrovascular accident)   . Depression   . Bipolar affect, depressed (HCC)   . Anxiety     There are no active problems to display for this patient.   Past Surgical History  Procedure Laterality Date  . Brain surgery  1976  . Cholecystectomy  2001  . Breast lumpectomy  2001  . Pituitary excision  1976  . Dg av dialysis graft declot or      X 4  . Peripheral vascular catheterization N/A 08/21/2015    Procedure: Dialysis/Perma Catheter Insertion;  Surgeon: Renford Dills, MD;  Location: [facility] INVASIVE CV LAB;  Service:  Cardiovascular;  Laterality: N/A;    Past Surgical History  Procedure Laterality Date  . Brain surgery  1976  . Cholecystectomy  2001  . Breast lumpectomy  2001  . Pituitary excision  1976  . Dg av dialysis graft declot or      X 4  . Peripheral vascular catheterization N/A 08/21/2015    Procedure: Dialysis/Perma Catheter Insertion;  Surgeon: Renford Dills, MD;  Location: [facility] INVASIVE CV LAB;  Service: Cardiovascular;  Laterality: N/A;    Current Outpatient Rx  Name  Route  Sig  Dispense  Refill  . albuterol (PROVENTIL HFA;VENTOLIN HFA) 108 (90 BASE) MCG/ACT inhaler   Inhalation   Inhale 2 puffs into the lungs every 6 (six) hours as needed for wheezing or shortness of breath.         Marland Kitchen aspirin 81 MG chewable tablet   Oral   Chew 81 mg by mouth daily.         . budesonide-formoterol (SYMBICORT) 80-4.5 MCG/ACT inhaler   Inhalation   Inhale 2 puffs into the lungs 2 (two) times daily.         . calcium acetate (PHOSLO) 667 MG capsule   Oral   Take 2,001-3,335 mg by mouth 5 (five) times daily. Pt takes five capsules with meals and three capsules with snacks.         . fenofibrate (TRICOR) 145 MG tablet  Oral   Take 145 mg by mouth every other day.          . fludrocortisone (FLORINEF) 0.1 MG tablet   Oral   Take 0.1 mg by mouth daily.         . folic acid-vitamin b complex-vitamin c-selenium-zinc (DIALYVITE) 3 MG TABS tablet   Oral   Take 1 tablet by mouth daily.         . furosemide (LASIX) 80 MG tablet   Oral   Take 80 mg by mouth 2 (two) times daily.         Marland Kitchen gabapentin (NEURONTIN) 100 MG capsule   Oral   Take 400 mg by mouth at bedtime.         . hydrocortisone (CORTEF) 10 MG tablet   Oral   Take 5-10 mg by mouth 2 (two) times daily. Pt takes 10 mg in the morning and 5 mg at bedtime.         Marland Kitchen HYDROmorphone (DILAUDID) 4 MG tablet   Oral   Take 6 mg by mouth every 6 (six) hours as needed for severe pain.          . hydrOXYzine  (ATARAX/VISTARIL) 25 MG tablet   Oral   Take 25 mg by mouth 3 (three) times daily.         Marland Kitchen levothyroxine (SYNTHROID, LEVOTHROID) 112 MCG tablet   Oral   Take 112 mcg by mouth daily.          Marland Kitchen lovastatin (MEVACOR) 40 MG tablet   Oral   Take 20 mg by mouth every evening.          Marland Kitchen MELATONIN PO   Oral   Take 2 tablets by mouth at bedtime.         . methylphenidate (RITALIN) 20 MG tablet   Oral   Take 20-30 mg by mouth 2 (two) times daily. Pt takes 30 mg in the morning and 20 mg at lunch.         . Naloxone HCl 0.4 MG/0.4ML SOAJ   Injection   Inject 1 Dose as directed every 10 (ten) minutes as needed (if patient is not breathing).          . ondansetron (ZOFRAN-ODT) 8 MG disintegrating tablet   Oral   Take 8 mg by mouth every 8 (eight) hours as needed for nausea or vomiting.         . OxyCODONE (OXYCONTIN) 40 mg T12A 12 hr tablet   Oral   Take 40 mg by mouth 3 (three) times daily.         . polyethylene glycol (MIRALAX / GLYCOLAX) packet   Oral   Take 17 g by mouth daily as needed for mild constipation.          . risperidone (RISPERDAL) 4 MG tablet   Oral   Take 2-4 mg by mouth 2 (two) times daily. Pt takes 4 mg in the morning and 2 mg at bedtime.         . topiramate (TOPAMAX) 200 MG tablet   Oral   Take 200 mg by mouth 2 (two) times daily. Pt takes with a 50 mg tablet.         . topiramate (TOPAMAX) 50 MG tablet   Oral   Take 50 mg by mouth 2 (two) times daily. Pt takes with a 200 mg tablet.         . Vitamin D, Ergocalciferol, (DRISDOL) 50000 UNITS  CAPS capsule   Oral   Take 50,000 Units by mouth every 30 (thirty) days.           Allergies:  Dhea; Demerol; Floxin; Nsaids; Nubain; Phenergan; Stadol; and Erythromycin  Family History: No family history on file.  Social History: Social History  Substance Use Topics  . Smoking status: Current Every Day Smoker -- 0.50 packs/day for 16 years    Types: Cigarettes  . Smokeless  tobacco: Not on file  . Alcohol Use: 0.0 oz/week    0 Standard drinks or equivalent per week     Comment: occasionally     Review of Systems:   10 point review of systems was performed and was otherwise negative:  Constitutional: No fever Eyes: Patient is blind ENT: No sore throat, ear pain Cardiac: No chest pain Respiratory: No shortness of breath, wheezing, or stridor Abdomen: No abdominal pain, no vomiting, No diarrhea Endocrine: No weight loss, No night sweats Extremities: Previous AKA's Skin: No rashes, easy bruising Neurologic: No focal weakness, trouble with speech or swollowing Urologic: Patient has history of renal failure does not put out any urine on her own.   Physical Exam:  ED Triage Vitals  Enc Vitals Group     BP 10/23/15 1409 150/85 mmHg     Pulse Rate 10/23/15 1407 65     Resp 10/23/15 1407 18     Temp 10/23/15 1414 98.1 F (36.7 C)     Temp Source 10/23/15 1414 Oral     SpO2 10/23/15 1407 98 %     Weight 10/23/15 1407 178 lb 9.2 oz (81 kg)     Height 10/23/15 1407  (1.499 m)     Head Cir --      Peak Flow --      Pain Score 10/23/15 1409 7     Pain Loc --      Pain Edu? --      Excl. in GC? --     General: Awake , Alert , and Oriented times 3; GCS 15 Head: Normal cephalic , atraumatic Eyes: Patient's blind with clear conjunctiva Nose/Throat: No nasal drainage, patent upper airway without erythema or exudate. Moist mucous membranes Neck: Supple, Full range of motion, No anterior adenopathy or palpable thyroid masses Lungs: Clear to ascultation without wheezes , rhonchi, or rales Heart: Regular rate, regular rhythm without murmurs , gallops , or rubs Abdomen: Soft, non tender without rebound, guarding , or rigidity; bowel sounds positive and symmetric in all 4 quadrants. No organomegaly .        Extremities: Upper extremities show 2+ equal pulses Neurologic: No focal deficits with good hand grasp in both upper extremities which is  symmetric Skin: warm, dry, no rashes   Labs:   All laboratory work was reviewed including any pertinent negatives or positives listed below:  Labs Reviewed  CBC WITH DIFFERENTIAL/PLATELET - Abnormal; Notable for the following:    RBC 3.50 (*)    Hemoglobin 10.6 (*)    HCT 32.2 (*)    RDW 17.3 (*)    Neutro Abs 8.6 (*)    All other components within normal limits  COMPREHENSIVE METABOLIC PANEL - Abnormal; Notable for the following:    Potassium 3.3 (*)    Chloride 98 (*)    BUN 21 (*)    Creatinine, Ser 3.97 (*)    Total Protein 6.2 (*)    Albumin 3.1 (*)    AST 48 (*)    GFR calc non  Af Amer 13 (*)    GFR calc Af Amer 15 (*)    All other components within normal limits  MAGNESIUM   laboratory work was reviewed and appears typical of postdialysis values    Radiology:   I personally reviewed the radiologic studies   EXAM: CT HEAD WITHOUT CONTRAST  TECHNIQUE: Contiguous axial images were obtained from the base of the skull through the vertex without intravenous contrast.  COMPARISON: September 24, 2013  FINDINGS: The patient has had a previous right frontal craniotomy. The ventricles are normal in size and configuration. There is persistent decreased attenuation in a portion of the mid to posterior right frontal lobe extending from the frontal horn the right lateral ventricle anteriorly and somewhat inferiorly, consistent with chronic encephalomalacia. There is no demonstrable mass, hemorrhage, extra-axial fluid collection, or midline shift. There are no new gray-white compartment lesions. There is no evident acute infarct. Bony calvarium appears intact except for the postoperative change in the right frontal region. The mastoid air cells are clear. No intraorbital lesions are identified.  IMPRESSION: Stable postoperative change right frontal region with areas of encephalomalacia in the right frontal lobe. No new gray-white compartment lesions are  identified. No acute infarct is evident. No hemorrhage or mass effect.  ED Course:  Patient's stay here was uneventful and she appears to have typical laboratory work with her history of renal failure. Her head CT is negative and I felt the patient not require lumbar puncture at this time that she's had similar headaches before and has chronic pain disorder.    Assessment: * Acute exacerbation of chronic cephalgia Generalized weakness unspecified      Plan: * Outpatient management Patient was advised to return immediately if condition worsens. Patient was advised to follow up with their primary care physician or other specialized physicians involved in their outpatient care            Jennye Moccasin, MD 10/23/15 1510

## 2015-10-23 NOTE — ED Notes (Signed)
She arrives via EMS from dialysis - she was unable to tolerate her dialysis today and she began verbalizing weakness  "My head started feeling really funny."  Pt was on dialysis for two hours and her normal dialysis is 3 hours  - unknown amount dialysed   dialysis cath to left clavicle area   She also has a history of seizure -  her last seizure being yesterday    Legally blind in both eyes

## 2015-11-03 ENCOUNTER — Emergency Department: Payer: Medicare Other

## 2015-11-03 DIAGNOSIS — Z7982 Long term (current) use of aspirin: Secondary | ICD-10-CM | POA: Diagnosis not present

## 2015-11-03 DIAGNOSIS — Z79899 Other long term (current) drug therapy: Secondary | ICD-10-CM | POA: Insufficient documentation

## 2015-11-03 DIAGNOSIS — Z7951 Long term (current) use of inhaled steroids: Secondary | ICD-10-CM | POA: Insufficient documentation

## 2015-11-03 DIAGNOSIS — F17210 Nicotine dependence, cigarettes, uncomplicated: Secondary | ICD-10-CM | POA: Diagnosis not present

## 2015-11-03 DIAGNOSIS — Z792 Long term (current) use of antibiotics: Secondary | ICD-10-CM | POA: Diagnosis not present

## 2015-11-03 DIAGNOSIS — J45901 Unspecified asthma with (acute) exacerbation: Secondary | ICD-10-CM | POA: Diagnosis not present

## 2015-11-03 DIAGNOSIS — R05 Cough: Secondary | ICD-10-CM | POA: Diagnosis present

## 2015-11-03 NOTE — ED Notes (Signed)
PT in with co cough and shob, started 1 week ago and was seen at [facility] urgent care and dx with bronchitis.  Was put on antibiotics and pt states she feels shob.  No distress noted in triage at this time.

## 2015-11-04 ENCOUNTER — Emergency Department
Admission: EM | Admit: 2015-11-04 | Discharge: 2015-11-04 | Disposition: A | Discharge status: Home / Self Care | Payer: Medicare Other | Attending: Emergency Medicine | Admitting: Emergency Medicine

## 2015-11-04 DIAGNOSIS — J209 Acute bronchitis, unspecified: Secondary | ICD-10-CM

## 2015-11-04 DIAGNOSIS — J45901 Unspecified asthma with (acute) exacerbation: Secondary | ICD-10-CM | POA: Diagnosis not present

## 2015-11-04 MED ORDER — IPRATROPIUM-ALBUTEROL 0.5-2.5 (3) MG/3ML IN SOLN
3.0000 mL | Freq: Once | RESPIRATORY_TRACT | Status: AC
  Administered 2015-11-04: 3 mL via RESPIRATORY_TRACT
  Filled 2015-11-04: qty 3

## 2015-11-04 MED ORDER — PREDNISONE 20 MG PO TABS: 60.0000 mg | ORAL_TABLET | Freq: Every day | ORAL | Status: DC

## 2015-11-04 MED ORDER — GUAIFENESIN ER 600 MG PO TB12: 600.0000 mg | ORAL_TABLET | Freq: Two times a day (BID) | ORAL | Status: DC | PRN

## 2015-11-04 NOTE — ED Notes (Signed)
Pt sleeping. Woke up and notified she was being discharged. Vs wnl. Right thumb iv removed. Facility notified by [employer] and will come to pick up. Tech in room dressing pt

## 2015-11-04 NOTE — ED Provider Notes (Signed)
[facility] Emergency Department Provider Note  ____________________________________________  Time seen: 4:20 AM  I have reviewed the triage vital signs and the nursing notes.   HISTORY  Chief Complaint Cough      HPI Erin [name] is a 45 y.o. female presents with cough and dyspnea times one week. Patient denies any chest pain. Patient denies any fever afebrile on presentation temperature 98.6 on arrival. Of note patient states that she was recently seen in prescribed antibiotics for acute bronchitis which she is still currently taking.     Past Medical History  Diagnosis Date  . Fibromyalgia   . Chronic headaches     Seeing Pain Management  . Hyperlipidemia   . Psychosis   . Hypothyroid   . Narcolepsy   . Asthma   . Constipation   . Kidney failure     Currently on Dialysis  . Blind   . Epilepsy (HCC)   . History of DVT (deep vein thrombosis)   . History of CVA (cerebrovascular accident)   . Depression   . Bipolar affect, depressed (HCC)   . Anxiety     There are no active problems to display for this patient.   Past Surgical History  Procedure Laterality Date  . Brain surgery  1976  . Cholecystectomy  2001  . Breast lumpectomy  2001  . Pituitary excision  1976  . Dg av dialysis graft declot or      X 4  . Peripheral vascular catheterization N/A 08/21/2015    Procedure: Dialysis/Perma Catheter Insertion;  Surgeon: Renford Dills, MD;  Location: [facility] INVASIVE CV LAB;  Service: Cardiovascular;  Laterality: N/A;    Current Outpatient Rx  Name  Route  Sig  Dispense  Refill  . albuterol (PROVENTIL HFA;VENTOLIN HFA) 108 (90 BASE) MCG/ACT inhaler   Inhalation   Inhale 2 puffs into the lungs every 6 (six) hours as needed for wheezing or shortness of breath.         Marland Kitchen albuterol (PROVENTIL) (5 MG/ML) 0.5% nebulizer solution   Nebulization   Take 2.5 mg by nebulization every 6 (six) hours as needed for wheezing or shortness of  breath.          Marland Kitchen amoxicillin-clavulanate (AUGMENTIN) 875-125 MG tablet   Oral   Take 1 tablet by mouth 2 (two) times daily.         Marland Kitchen aspirin 81 MG chewable tablet   Oral   Chew 81 mg by mouth daily.         . benzonatate (TESSALON) 100 MG capsule   Oral   Take 100 mg by mouth 3 (three) times daily as needed for cough.          . budesonide-formoterol (SYMBICORT) 80-4.5 MCG/ACT inhaler   Inhalation   Inhale 2 puffs into the lungs 2 (two) times daily.         . calcium acetate (PHOSLO) 667 MG capsule   Oral   Take 2,001-3,335 mg by mouth 5 (five) times daily. Pt takes five capsules with meals and three capsules with snacks.         . chlorpheniramine-HYDROcodone (TUSSIONEX) 10-8 MG/5ML SUER   Oral   Take 5 mLs by mouth every 12 (twelve) hours as needed.         . diphenhydrAMINE (BENADRYL) 25 mg capsule   Oral   Take 25 mg by mouth every 6 (six) hours as needed.         Marland Kitchen  fenofibrate (TRICOR) 145 MG tablet   Oral   Take 145 mg by mouth every other day.          . fludrocortisone (FLORINEF) 0.1 MG tablet   Oral   Take 0.1 mg by mouth daily.         . folic acid-vitamin b complex-vitamin c-selenium-zinc (DIALYVITE) 3 MG TABS tablet   Oral   Take 1 tablet by mouth daily.         . furosemide (LASIX) 80 MG tablet   Oral   Take 80 mg by mouth 2 (two) times daily.         Marland Kitchen gabapentin (NEURONTIN) 100 MG capsule   Oral   Take 400 mg by mouth at bedtime.         . hydrocortisone (CORTEF) 10 MG tablet   Oral   Take 5-10 mg by mouth 2 (two) times daily. Pt takes 10 mg in the morning and 5 mg at bedtime.         Marland Kitchen HYDROmorphone (DILAUDID) 4 MG tablet   Oral   Take 6 mg by mouth every 6 (six) hours as needed for severe pain.          . hydrOXYzine (ATARAX/VISTARIL) 25 MG tablet   Oral   Take 25 mg by mouth 3 (three) times daily.         Marland Kitchen levothyroxine (SYNTHROID, LEVOTHROID) 112 MCG tablet   Oral   Take 112 mcg by mouth daily.           Marland Kitchen lovastatin (MEVACOR) 40 MG tablet   Oral   Take 20 mg by mouth every evening.          Marland Kitchen MELATONIN PO   Oral   Take 2 tablets by mouth at bedtime.         . methylphenidate (RITALIN) 20 MG tablet   Oral   Take 20-30 mg by mouth 2 (two) times daily. Pt takes 30 mg in the morning and 20 mg at lunch.         . Naloxone HCl 0.4 MG/0.4ML SOAJ   Injection   Inject 1 Dose as directed every 10 (ten) minutes as needed (if patient is not breathing).          . ondansetron (ZOFRAN-ODT) 8 MG disintegrating tablet   Oral   Take 8 mg by mouth every 8 (eight) hours as needed for nausea or vomiting.         . OxyCODONE (OXYCONTIN) 40 mg T12A 12 hr tablet   Oral   Take 40 mg by mouth 3 (three) times daily.         . polyethylene glycol (MIRALAX / GLYCOLAX) packet   Oral   Take 17 g by mouth daily as needed for mild constipation.          . risperidone (RISPERDAL) 4 MG tablet   Oral   Take 2-4 mg by mouth 2 (two) times daily. Pt takes 4 mg in the morning and 2 mg at bedtime.         . topiramate (TOPAMAX) 200 MG tablet   Oral   Take 200 mg by mouth 2 (two) times daily. Pt takes with a 50 mg tablet.         . topiramate (TOPAMAX) 50 MG tablet   Oral   Take 50 mg by mouth 2 (two) times daily. Pt takes with a 200 mg tablet.         Marland Kitchen  Vitamin D, Ergocalciferol, (DRISDOL) 50000 UNITS CAPS capsule   Oral   Take 50,000 Units by mouth every 30 (thirty) days.           Allergies Dhea; Demerol; Floxin; Nsaids; Nubain; Phenergan; Stadol; and Erythromycin  No family history on file.  Social History Social History  Substance Use Topics  . Smoking status: Current Every Day Smoker -- 0.50 packs/day for 16 years    Types: Cigarettes  . Smokeless tobacco: Not on file  . Alcohol Use: 0.0 oz/week    0 Standard drinks or equivalent per week     Comment: occasionally    Review of Systems  Constitutional: Negative for fever. Eyes: Negative for visual  changes. ENT: Negative for sore throat. Cardiovascular: Negative for chest pain. Respiratory: Negative for shortness of breath. Positive for cough Gastrointestinal: Negative for abdominal pain, vomiting and diarrhea. Genitourinary: Negative for dysuria. Musculoskeletal: Negative for back pain. Skin: Negative for rash. Neurological: Negative for headaches, focal weakness or numbness.   10-point ROS otherwise negative.  ____________________________________________   PHYSICAL EXAM:  VITAL SIGNS: ED Triage Vitals  Enc Vitals Group     BP 11/03/15 2253 152/77 mmHg     Pulse Rate 11/03/15 2253 80     Resp 11/03/15 2253 18     Temp 11/03/15 2253 98.6 F (37 C)     Temp src --      SpO2 11/03/15 2253 98 %     Weight 11/03/15 2253 175 lb (79.379 kg)     Height 11/03/15 2253 4\' 10"  (1.473 m)     Head Cir --      Peak Flow --      Pain Score 11/03/15 2306 0     Pain Loc --      Pain Edu? --      Excl. in GC? --     Constitutional: Alert and oriented. Well appearing and in no distress. Eyes: Conjunctivae are normal. PERRL. Normal extraocular movements. ENT   Head: Normocephalic and atraumatic.   Nose: No congestion/rhinnorhea.   Mouth/Throat: Mucous membranes are moist.   Neck: No stridor. Hematological/Lymphatic/Immunilogical: No cervical lymphadenopathy. Cardiovascular: Normal rate, regular rhythm. Normal and symmetric distal pulses are present in all extremities. No murmurs, rubs, or gallops. Respiratory: Normal respiratory effort without tachypnea nor retractions. Breath sounds are clear and equal bilaterally. Mild bibasilar wheezes Gastrointestinal: Soft and nontender. No distention. There is no CVA tenderness. Genitourinary: deferred Musculoskeletal: Nontender with normal range of motion in all extremities. No joint effusions.  No lower extremity tenderness nor edema. Neurologic:  Normal speech and language. No gross focal neurologic deficits are appreciated.  Speech is normal.  Skin:  Skin is warm, dry and intact. No rash noted. Psychiatric: Mood and affect are normal. Speech and behavior are normal. Patient exhibits appropriate insight and judgment.   RADIOLOGY     DG Chest 2 View (Final result) Result time: 11/03/15 23:50:59   Final result by Rad Results In Interface (11/03/15 23:50:59)   Narrative:   CLINICAL DATA: Cough and short of breath  EXAM: CHEST 2 VIEW  COMPARISON: 05/30/2015  FINDINGS: LEFT central venous line tip is in the SVC. Stable cardiac silhouette. Mild interstitial edema pattern. No pneumothorax. No pleural fluid.  IMPRESSION: Mild interstitial edema pattern. No overt pulmonary edema.   Electronically Signed By: Genevive Bi M.D. On: 11/03/2015 23:50      INITIAL IMPRESSION / ASSESSMENT AND PLAN / ED COURSE  Pertinent labs & imaging results that were available during  my care of the patient were reviewed by me and considered in my medical decision making (see chart for details).  Patient see the DuoNeb with improvement in symptoms. We'll refer patient to primary care provider further management on the outpatient setting. Prednisone was added as well as Mucinex.  ____________________________________________   FINAL CLINICAL IMPRESSION(S) / ED DIAGNOSES  Final diagnoses:  Acute bronchitis, unspecified organism      Darci Current, MD 11/04/15 [phone]

## 2015-11-04 NOTE — ED Notes (Addendum)
Pt picked up by Liborio Nixon from [facility]. Packet of paperwork given and reviewed with her.

## 2015-11-04 NOTE — Discharge Instructions (Signed)

## 2015-11-15 ENCOUNTER — Emergency Department
Admission: EM | Admit: 2015-11-15 | Discharge: 2015-11-15 | Disposition: A | Discharge status: Home / Self Care | Payer: Medicare Other | Attending: Emergency Medicine | Admitting: Emergency Medicine

## 2015-11-15 ENCOUNTER — Emergency Department: Payer: Medicare Other

## 2015-11-15 DIAGNOSIS — M79672 Pain in left foot: Secondary | ICD-10-CM

## 2015-11-15 DIAGNOSIS — M25572 Pain in left ankle and joints of left foot: Secondary | ICD-10-CM | POA: Insufficient documentation

## 2015-11-15 DIAGNOSIS — Z7982 Long term (current) use of aspirin: Secondary | ICD-10-CM | POA: Diagnosis not present

## 2015-11-15 DIAGNOSIS — F17210 Nicotine dependence, cigarettes, uncomplicated: Secondary | ICD-10-CM | POA: Diagnosis not present

## 2015-11-15 DIAGNOSIS — Z79899 Other long term (current) drug therapy: Secondary | ICD-10-CM | POA: Diagnosis not present

## 2015-11-15 DIAGNOSIS — Z7952 Long term (current) use of systemic steroids: Secondary | ICD-10-CM | POA: Insufficient documentation

## 2015-11-15 DIAGNOSIS — R2232 Localized swelling, mass and lump, left upper limb: Secondary | ICD-10-CM | POA: Diagnosis present

## 2015-11-15 DIAGNOSIS — Z7951 Long term (current) use of inhaled steroids: Secondary | ICD-10-CM | POA: Insufficient documentation

## 2015-11-15 DIAGNOSIS — M79602 Pain in left arm: Secondary | ICD-10-CM | POA: Insufficient documentation

## 2015-11-15 NOTE — ED Notes (Signed)
Pt requesting to speak to EDP before being D/C

## 2015-11-15 NOTE — ED Notes (Signed)
[facility]. Pt reports L arm swelling and pain, has dialysis site that has been clotted off and not used for "awhile". States arm was tingling yesterday

## 2015-11-15 NOTE — Discharge Instructions (Signed)
Please follow-up your primary care physician tomorrow for recheck/reevaluation. Return to the emergency department for any personally concerning symptoms.    Musculoskeletal Pain Musculoskeletal pain is muscle and boney aches and pains. These pains can occur in any part of the body. Your caregiver may treat you without knowing the cause of the pain. They may treat you if blood or urine tests, X-rays, and other tests were normal.  CAUSES There is often not a definite cause or reason for these pains. These pains may be caused by a type of germ (virus). The discomfort may also come from overuse. Overuse includes working out too hard when your body is not fit. Boney aches also come from weather changes. Bone is sensitive to atmospheric pressure changes. HOME CARE INSTRUCTIONS   Ask when your test results will be ready. Make sure you get your test results.  Only take over-the-counter or prescription medicines for pain, discomfort, or fever as directed by your caregiver. If you were given medications for your condition, do not drive, operate machinery or power tools, or sign legal documents for 24 hours. Do not drink alcohol. Do not take sleeping pills or other medications that may interfere with treatment.  Continue all activities unless the activities cause more pain. When the pain lessens, slowly resume normal activities. Gradually increase the intensity and duration of the activities or exercise.  During periods of severe pain, bed rest may be helpful. Lay or sit in any position that is comfortable.  Putting ice on the injured area.  Put ice in a bag.  Place a towel between your skin and the bag.  Leave the ice on for 15 to 20 minutes, 3 to 4 times a day.  Follow up with your caregiver for continued problems and no reason can be found for the pain. If the pain becomes worse or does not go away, it may be necessary to repeat tests or do additional testing. Your caregiver may need to look  further for a possible cause. SEEK IMMEDIATE MEDICAL CARE IF:  You have pain that is getting worse and is not relieved by medications.  You develop chest pain that is associated with shortness or breath, sweating, feeling sick to your stomach (nauseous), or throw up (vomit).  Your pain becomes localized to the abdomen.  You develop any new symptoms that seem different or that concern you. MAKE SURE YOU:   Understand these instructions.  Will watch your condition.  Will get help right away if you are not doing well or get worse.   This information is not intended to replace advice given to you by your health care provider. Make sure you discuss any questions you have with your health care provider.   Document Released: 10/17/2005 Document Revised: 01/09/2012 Document Reviewed: 06/21/2013 Elsevier Interactive Patient Education [employer]

## 2015-11-15 NOTE — ED Provider Notes (Addendum)
[facility] Emergency Department Provider Note  Time seen: 12:30 PM  I have reviewed the triage vital signs and the nursing notes.   HISTORY  Chief Complaint Arm Swelling    HPI Erin [name] is a 45 y.o. female with a past medical history of fibromyalgia, hyperlipidemia, hypothyroidism, asthma, constipation, kidney failure on dialysis Monday/Wednesday/Friday, epilepsy, history of DVT not on anticoagulation due to internal bleeding risk, bipolar, anxiety who presents to the emergency department with left upper extremity pain and swelling, as well as left ankle pain. According to the patient she has not fallen on either extremity. States for the past one week she has been having left ankle pain that has been worsening, hurts to walk or move the ankle. Scars her left upper extremity she first began noting some mild swelling yesterday, states it is now tender today. States a history of DVT in the past in her old AV fistula of her left upper extremity which is no longer in use. But states they opted not to do anticoagulation as she has an internal bleeding risk. Denies fever, chest pain, shortness of breath.    Past Medical History  Diagnosis Date  . Fibromyalgia   . Chronic headaches     Seeing Pain Management  . Hyperlipidemia   . Psychosis   . Hypothyroid   . Narcolepsy   . Asthma   . Constipation   . Kidney failure     Currently on Dialysis  . Blind   . Epilepsy (HCC)   . History of DVT (deep vein thrombosis)   . History of CVA (cerebrovascular accident)   . Depression   . Bipolar affect, depressed (HCC)   . Anxiety     There are no active problems to display for this patient.   Past Surgical History  Procedure Laterality Date  . Brain surgery  1976  . Cholecystectomy  2001  . Breast lumpectomy  2001  . Pituitary excision  1976  . Dg av dialysis graft declot or      X 4  . Peripheral vascular catheterization N/A 08/21/2015    Procedure:  Dialysis/Perma Catheter Insertion;  Surgeon: Renford Dills, MD;  Location: [facility] INVASIVE CV LAB;  Service: Cardiovascular;  Laterality: N/A;    Current Outpatient Rx  Name  Route  Sig  Dispense  Refill  . albuterol (PROVENTIL HFA;VENTOLIN HFA) 108 (90 BASE) MCG/ACT inhaler   Inhalation   Inhale 2 puffs into the lungs every 6 (six) hours as needed for wheezing or shortness of breath.         Marland Kitchen albuterol (PROVENTIL) (5 MG/ML) 0.5% nebulizer solution   Nebulization   Take 2.5 mg by nebulization every 6 (six) hours as needed for wheezing or shortness of breath.          Marland Kitchen aspirin 81 MG chewable tablet   Oral   Chew 81 mg by mouth daily.         . budesonide-formoterol (SYMBICORT) 80-4.5 MCG/ACT inhaler   Inhalation   Inhale 2 puffs into the lungs 2 (two) times daily.         . calcium acetate (PHOSLO) 667 MG capsule   Oral   Take 2,001-3,335 mg by mouth 5 (five) times daily. Pt takes five capsules with meals and three capsules with snacks.         . chlorpheniramine-HYDROcodone (TUSSIONEX) 10-8 MG/5ML SUER   Oral   Take 5 mLs by mouth every 12 (twelve) hours as needed.         Marland Kitchen  diphenhydrAMINE (BENADRYL) 25 mg capsule   Oral   Take 25 mg by mouth every 6 (six) hours as needed.         . fenofibrate (TRICOR) 145 MG tablet   Oral   Take 145 mg by mouth every other day.          . fludrocortisone (FLORINEF) 0.1 MG tablet   Oral   Take 0.1 mg by mouth daily.         . folic acid-vitamin b complex-vitamin c-selenium-zinc (DIALYVITE) 3 MG TABS tablet   Oral   Take 1 tablet by mouth daily.         . furosemide (LASIX) 80 MG tablet   Oral   Take 80 mg by mouth 2 (two) times daily.         Marland Kitchen gabapentin (NEURONTIN) 100 MG capsule   Oral   Take 400 mg by mouth at bedtime.         Marland Kitchen guaiFENesin (MUCINEX) 600 MG 12 hr tablet   Oral   Take 1 tablet (600 mg total) by mouth 2 (two) times daily as needed.   60 tablet   2   . hydrocortisone (CORTEF)  10 MG tablet   Oral   Take 5-10 mg by mouth 2 (two) times daily. Pt takes 10 mg in the morning and 5 mg at bedtime.         Marland Kitchen HYDROmorphone (DILAUDID) 4 MG tablet   Oral   Take 6 mg by mouth every 6 (six) hours as needed for severe pain.          . hydrOXYzine (ATARAX/VISTARIL) 25 MG tablet   Oral   Take 25 mg by mouth 3 (three) times daily.         Marland Kitchen levothyroxine (SYNTHROID, LEVOTHROID) 112 MCG tablet   Oral   Take 112 mcg by mouth daily.          Marland Kitchen lovastatin (MEVACOR) 40 MG tablet   Oral   Take 20 mg by mouth every evening.          Marland Kitchen MELATONIN PO   Oral   Take 2 tablets by mouth at bedtime.         . methylphenidate (RITALIN) 20 MG tablet   Oral   Take 20-30 mg by mouth 2 (two) times daily. Pt takes 30 mg in the morning and 20 mg at lunch.         . Naloxone HCl 0.4 MG/0.4ML SOAJ   Injection   Inject 1 Dose as directed every 10 (ten) minutes as needed (if patient is not breathing).          . ondansetron (ZOFRAN-ODT) 8 MG disintegrating tablet   Oral   Take 8 mg by mouth every 8 (eight) hours as needed for nausea or vomiting.         . OxyCODONE (OXYCONTIN) 40 mg T12A 12 hr tablet   Oral   Take 40 mg by mouth 3 (three) times daily.         . polyethylene glycol (MIRALAX / GLYCOLAX) packet   Oral   Take 17 g by mouth daily as needed for mild constipation.          . predniSONE (DELTASONE) 20 MG tablet   Oral   Take 3 tablets (60 mg total) by mouth daily.   15 tablet   0   . risperidone (RISPERDAL) 4 MG tablet   Oral   Take 2-4 mg by  mouth 2 (two) times daily. Pt takes 4 mg in the morning and 2 mg at bedtime.         . topiramate (TOPAMAX) 200 MG tablet   Oral   Take 200 mg by mouth 2 (two) times daily. Pt takes with a 50 mg tablet.         . topiramate (TOPAMAX) 50 MG tablet   Oral   Take 50 mg by mouth 2 (two) times daily. Pt takes with a 200 mg tablet.         . Vitamin D, Ergocalciferol, (DRISDOL) 50000 UNITS CAPS  capsule   Oral   Take 50,000 Units by mouth every 30 (thirty) days.           Allergies Dhea; Demerol; Floxin; Nsaids; Nubain; Phenergan; Stadol; and Erythromycin  No family history on file.  Social History Social History  Substance Use Topics  . Smoking status: Current Every Day Smoker -- 0.50 packs/day for 16 years    Types: Cigarettes  . Smokeless tobacco: None  . Alcohol Use: No     Comment: occasionally    Review of Systems Constitutional: Negative for fever. Cardiovascular: Negative for chest pain. Respiratory: Negative for shortness of breath. Gastrointestinal: Negative for abdominal pain Genitourinary: Negative for dysuria. Musculoskeletal: Positive for left upper extremity swelling and pain. Positive for left ankle pain. Skin: Negative for rash. Neurological: Negative for headache 10-point ROS otherwise negative.  ____________________________________________   PHYSICAL EXAM:  VITAL SIGNS: ED Triage Vitals  Enc Vitals Group     BP 11/15/15 1208 132/79 mmHg     Pulse Rate 11/15/15 1208 77     Resp 11/15/15 1208 15     Temp 11/15/15 1208 98.2 F (36.8 C)     Temp Source 11/15/15 1208 Oral     SpO2 11/15/15 1208 97 %     Weight 11/15/15 1208 175 lb (79.379 kg)     Height 11/15/15 1208  (1.473 m)     Head Cir --      Peak Flow --      Pain Score 11/15/15 1208 7     Pain Loc --      Pain Edu? --      Excl. in GC? --     Constitutional: Alert and oriented. Well appearing and in no distress. Eyes: Normal exam ENT   Head: Normocephalic and atraumatic.   Mouth/Throat: Mucous membranes are moist. Cardiovascular: Normal rate, regular rhythm.  Respiratory: Normal respiratory effort without tachypnea nor retractions. Breath sounds are clear and equal bilaterally. No wheezes/rales/rhonchi. Gastrointestinal: Soft and nontender. No distention.   Musculoskeletal: No obvious swelling to the left upper extremity noted. The patient does have an old  AV fistula, with a palpable pulse through the area. Neurovascular intact distally. No erythema noted. The patient doesn't mild tenderness palpation mostly of the bicep. Patient has mild left ankle tenderness palpation, pain with range of motion. No swelling or erythema noted. Neurologic:  Normal speech and language. No gross focal neurologic deficits  Skin:  Skin is warm, dry and intact.  Psychiatric: Mood and affect are normal. Speech and behavior are normal.  ____________________________________________    EKG  EKG reviewed and interpreted by myself shows normal sinus rhythm at 77 bpm, narrow QRS, normal axis, RSR pattern most consistent with right bundle branch block. Nonspecific ST changes. No ST elevations.  ____________________________________________    RADIOLOGY  X-ray shows possible fracture recommends CT CT shows no fracture. Ultrasound negative for DVT,  thrombosed graft which is stable from prior exams.  ____________________________________________   INITIAL IMPRESSION / ASSESSMENT AND PLAN / ED COURSE  Pertinent labs & imaging results that were available during my care of the patient were reviewed by me and considered in my medical decision making (see chart for details).  Patient presents the emergency department with left upper extremity swelling and pain. Patient has a history of chronic pain on multiple home narcotic medications. Denies chest pain or shortness of breath. Also states for the past one week she has had left ankle pain, worse when walking. We'll obtain an ultrasound the left upper extremity, and a left ankle x-ray. Patient states she has not missed any dialysis recently, did her full session on Friday and is scheduled for tomorrow.  I see no signs of swelling or erythema in the left upper extremity. No signs of swelling or erythema in the left ankle.  Imaging at baseline. No signs of cellulitis on exam. We'll discharge home with primary care follow-up  tomorrow. Patient is concerned because be cannot give her an answer for why her arm is swollen. I discussed with the patient and with a normal ultrasound I do not believe any clot or DVT as the cause of her swelling. Again the arm does not appear obviously swollen on exam but the patient states that it is swollen compared to what it normally looks like. No signs of cellulitis, neurovascularly intact. Patient remains quite concerned about the swelling, and discussed with the patient she should follow up with her doctor tomorrow as well as orthopedics but at this point I do not believe further testing would be of much utility.  ____________________________________________   FINAL CLINICAL IMPRESSION(S) / ED DIAGNOSES  Left upper extremity pain   Minna Antis, MD 11/15/15 1515  Minna Antis, MD 11/15/15 1539

## 2016-02-18 ENCOUNTER — Emergency Department
Admission: EM | Admit: 2016-02-18 | Discharge: 2016-02-18 | Disposition: A | Discharge status: Home / Self Care | Payer: Medicare Other | Attending: Emergency Medicine | Admitting: Emergency Medicine

## 2016-02-18 ENCOUNTER — Encounter: Payer: Self-pay | Admitting: Emergency Medicine

## 2016-02-18 ENCOUNTER — Emergency Department: Payer: Medicare Other

## 2016-02-18 DIAGNOSIS — F17210 Nicotine dependence, cigarettes, uncomplicated: Secondary | ICD-10-CM | POA: Insufficient documentation

## 2016-02-18 DIAGNOSIS — Z79899 Other long term (current) drug therapy: Secondary | ICD-10-CM | POA: Diagnosis not present

## 2016-02-18 DIAGNOSIS — Z86718 Personal history of other venous thrombosis and embolism: Secondary | ICD-10-CM | POA: Insufficient documentation

## 2016-02-18 DIAGNOSIS — M79604 Pain in right leg: Secondary | ICD-10-CM | POA: Diagnosis not present

## 2016-02-18 DIAGNOSIS — E785 Hyperlipidemia, unspecified: Secondary | ICD-10-CM | POA: Diagnosis not present

## 2016-02-18 DIAGNOSIS — N19 Unspecified kidney failure: Secondary | ICD-10-CM | POA: Insufficient documentation

## 2016-02-18 DIAGNOSIS — J45909 Unspecified asthma, uncomplicated: Secondary | ICD-10-CM | POA: Diagnosis not present

## 2016-02-18 DIAGNOSIS — M79661 Pain in right lower leg: Secondary | ICD-10-CM

## 2016-02-18 DIAGNOSIS — Z7982 Long term (current) use of aspirin: Secondary | ICD-10-CM | POA: Insufficient documentation

## 2016-02-18 DIAGNOSIS — F329 Major depressive disorder, single episode, unspecified: Secondary | ICD-10-CM | POA: Insufficient documentation

## 2016-02-18 DIAGNOSIS — Z7952 Long term (current) use of systemic steroids: Secondary | ICD-10-CM | POA: Insufficient documentation

## 2016-02-18 DIAGNOSIS — Z992 Dependence on renal dialysis: Secondary | ICD-10-CM | POA: Diagnosis not present

## 2016-02-18 DIAGNOSIS — Z8673 Personal history of transient ischemic attack (TIA), and cerebral infarction without residual deficits: Secondary | ICD-10-CM | POA: Diagnosis not present

## 2016-02-18 DIAGNOSIS — E039 Hypothyroidism, unspecified: Secondary | ICD-10-CM | POA: Diagnosis not present

## 2016-02-18 NOTE — ED Notes (Signed)
Pt presents ED via EMS with c/o right posterior calf pain, onset tonight. Pulse and sensation intact at distal extremity.

## 2016-02-18 NOTE — ED Provider Notes (Signed)
[facility] Emergency Department Provider Note     Time seen: ----------------------------------------- 9:14 PM on 02/18/2016 -----------------------------------------    I have reviewed the triage vital signs and the nursing notes.   HISTORY  Chief Complaint Leg Pain    HPI Erin [name] is a 45 y.o. female who presents ER with posterior right calf pain that started tonight.Patient denies any recent activity or trauma, denies fevers chills or other complaints. She presents solely for pain in the right calf.   Past Medical History  Diagnosis Date  . Fibromyalgia   . Chronic headaches     Seeing Pain Management  . Hyperlipidemia   . Psychosis   . Hypothyroid   . Narcolepsy   . Asthma   . Constipation   . Kidney failure     Currently on Dialysis  . Blind   . Epilepsy (HCC)   . History of DVT (deep vein thrombosis)   . History of CVA (cerebrovascular accident)   . Depression   . Bipolar affect, depressed (HCC)   . Anxiety     There are no active problems to display for this patient.   Past Surgical History  Procedure Laterality Date  . Brain surgery  1976  . Cholecystectomy  2001  . Breast lumpectomy  2001  . Pituitary excision  1976  . Dg av dialysis graft declot or      X 4  . Peripheral vascular catheterization N/A 08/21/2015    Procedure: Dialysis/Perma Catheter Insertion;  Surgeon: Renford Dills, MD;  Location: [facility] INVASIVE CV LAB;  Service: Cardiovascular;  Laterality: N/A;    Allergies Dhea; Demerol; Floxin; Nsaids; Nubain; Phenergan; Stadol; and Erythromycin  Social History Social History  Substance Use Topics  . Smoking status: Current Every Day Smoker -- 0.50 packs/day for 16 years    Types: Cigarettes  . Smokeless tobacco: None  . Alcohol Use: No     Comment: occasionally    Review of Systems Constitutional: Negative for fever. Eyes: Negative for visual changes. ENT: Negative for sore  throat. Cardiovascular: Negative for chest pain. Respiratory: Negative for shortness of breath. Gastrointestinal: Negative for abdominal pain, vomiting and diarrhea. Genitourinary: Negative for dysuria. Musculoskeletal: Positive for right leg pain Skin: Negative for rash. Neurological: Negative for headaches, focal weakness or numbness.  10-point ROS otherwise negative.  ____________________________________________   PHYSICAL EXAM:  VITAL SIGNS: ED Triage Vitals  Enc Vitals Group     BP 02/18/16 2010 156/84 mmHg     Pulse Rate 02/18/16 2010 77     Resp 02/18/16 2010 14     Temp 02/18/16 2010 98.4 F (36.9 C)     Temp Source 02/18/16 2010 Oral     SpO2 02/18/16 2010 97 %     Weight 02/18/16 2010 174 lb 2.6 oz (79 kg)     Height 02/18/16 2010  (1.473 m)     Head Cir --      Peak Flow --      Pain Score 02/18/16 2011 8     Pain Loc --      Pain Edu? --      Excl. in GC? --     Constitutional: Alert, no acute distress Cardiovascular: Normal rate, regular rhythm. No murmurs, rubs, or gallops. Respiratory: Normal respiratory effort without tachypnea nor retractions. Breath sounds are clear and equal bilaterally. No wheezes/rales/rhonchi. Musculoskeletal: Limited but unremarkable range of motion of the extremities, right calf and right lower extremity tenderness. Normal distal  pulses. Normal ABIs. Neurologic:  Normal speech and language. No gross focal neurologic deficits are appreciated.  Skin:  Skin is warm, dry and intact. No rash noted. Psychiatric: Mood and affect are normal.  ____________________________________________  ED COURSE:  Pertinent labs & imaging results that were available during my care of the patient were reviewed by me and considered in my medical decision making (see chart for details). Patient's no acute distress, will obtain ultrasound imaging, unclear etiology for her pain. ____________________________________________   RADIOLOGY  Lower  extremity ultrasound Is unremarkable ____________________________________________  FINAL ASSESSMENT AND PLAN  Right lower extremity pain  Plan: Patient with imaging as dictated above. Patient is in no acute distress, advised her to take her home pain medications as needed. Ultrasound is unremarkable and no signs of arterial insufficiency in the leg.   Emily Filbert, MD   Emily Filbert, MD 02/18/16 [id number]

## 2016-02-18 NOTE — Discharge Instructions (Signed)
Musculoskeletal Pain °Musculoskeletal pain is muscle and boney aches and pains. These pains can occur in any part of the body. Your caregiver may treat you without knowing the cause of the pain. They may treat you if blood or urine tests, X-rays, and other tests were normal.  °CAUSES °There is often not a definite cause or reason for these pains. These pains may be caused by a type of germ (virus). The discomfort may also come from overuse. Overuse includes working out too hard when your body is not fit. Boney aches also come from weather changes. Bone is sensitive to atmospheric pressure changes. °HOME CARE INSTRUCTIONS  °· Ask when your test results will be ready. Make sure you get your test results. °· Only take over-the-counter or prescription medicines for pain, discomfort, or fever as directed by your caregiver. If you were given medications for your condition, do not drive, operate machinery or power tools, or sign legal documents for 24 hours. Do not drink alcohol. Do not take sleeping pills or other medications that may interfere with treatment. °· Continue all activities unless the activities cause more pain. When the pain lessens, slowly resume normal activities. Gradually increase the intensity and duration of the activities or exercise. °· During periods of severe pain, bed rest may be helpful. Lay or sit in any position that is comfortable. °· Putting ice on the injured area. °· Put ice in a bag. °· Place a towel between your skin and the bag. °· Leave the ice on for 15 to 20 minutes, 3 to 4 times a day. °· Follow up with your caregiver for continued problems and no reason can be found for the pain. If the pain becomes worse or does not go away, it may be necessary to repeat tests or do additional testing. Your caregiver may need to look further for a possible cause. °SEEK IMMEDIATE MEDICAL CARE IF: °· You have pain that is getting worse and is not relieved by medications. °· You develop chest pain  that is associated with shortness or breath, sweating, feeling sick to your stomach (nauseous), or throw up (vomit). °· Your pain becomes localized to the abdomen. °· You develop any new symptoms that seem different or that concern you. °MAKE SURE YOU:  °· Understand these instructions. °· Will watch your condition. °· Will get help right away if you are not doing well or get worse. °  °This information is not intended to replace advice given to you by your health care provider. Make sure you discuss any questions you have with your health care provider. °  °Document Released: 10/17/2005 Document Revised: 01/09/2012 Document Reviewed: 06/21/2013 °Elsevier Interactive Patient Education ©2016 [employer] ° °Pain Without a Known Cause °WHAT IS PAIN WITHOUT A KNOWN CAUSE? °Pain can occur in any part of the body and can range from mild to severe. Sometimes no cause can be found for why you are having pain. Some types of pain that can occur without a known cause include:  °· Headache. °· Back pain. °· Abdominal pain. °· Neck pain. °HOW IS PAIN WITHOUT A KNOWN CAUSE DIAGNOSED?  °Your health care provider will try to find the cause of your pain. This may include: °· Physical exam. °· Medical history. °· Blood tests. °· Urine tests. °· X-rays. °If no cause is found, your health care provider may diagnose you with pain without a known cause.  °IS THERE TREATMENT FOR PAIN WITHOUT A CAUSE?  °Treatment depends on the kind of   pain you have. Your health care provider may prescribe medicines to help relieve your pain.  °WHAT CAN I DO AT HOME FOR MY PAIN?  °· Take medicines only as directed by your health care provider. °· Stop any activities that cause pain. During periods of severe pain, bed rest may help. °· Try to reduce your stress with activities such as yoga or meditation. Talk to your health care provider for other stress-reducing activity recommendations. °· Exercise regularly, if approved by your health care  provider. °· Eat a healthy diet that includes fruits and vegetables. This may improve pain. Talk to your health care provider if you have any questions about your diet. °WHAT IF MY PAIN DOES NOT GET BETTER?  °If you have a painful condition and no reason can be found for the pain or the pain gets worse, it is important to follow up with your health care provider. It may be necessary to repeat tests and look further for a possible cause.  °  °This information is not intended to replace advice given to you by your health care provider. Make sure you discuss any questions you have with your health care provider. °  °Document Released: 07/12/2001 Document Revised: 11/07/2014 Document Reviewed: 03/04/2014 °Elsevier Interactive Patient Education ©2016 [employer] Inc. ° °

## 2016-02-25 ENCOUNTER — Emergency Department: Payer: Medicare Other

## 2016-02-25 ENCOUNTER — Inpatient Hospital Stay
Admission: EM | Admit: 2016-02-25 | Discharge: 2016-03-01 | DRG: 377 | Disposition: A | Discharge status: Home / Self Care | Payer: Medicare Other | Attending: Internal Medicine | Admitting: Internal Medicine

## 2016-02-25 ENCOUNTER — Encounter: Payer: Self-pay | Admitting: Emergency Medicine

## 2016-02-25 DIAGNOSIS — E785 Hyperlipidemia, unspecified: Secondary | ICD-10-CM | POA: Diagnosis present

## 2016-02-25 DIAGNOSIS — Z833 Family history of diabetes mellitus: Secondary | ICD-10-CM

## 2016-02-25 DIAGNOSIS — J45909 Unspecified asthma, uncomplicated: Secondary | ICD-10-CM | POA: Diagnosis present

## 2016-02-25 DIAGNOSIS — G894 Chronic pain syndrome: Secondary | ICD-10-CM | POA: Diagnosis present

## 2016-02-25 DIAGNOSIS — F3162 Bipolar disorder, current episode mixed, moderate: Secondary | ICD-10-CM

## 2016-02-25 DIAGNOSIS — Z9889 Other specified postprocedural states: Secondary | ICD-10-CM

## 2016-02-25 DIAGNOSIS — F3160 Bipolar disorder, current episode mixed, unspecified: Secondary | ICD-10-CM | POA: Diagnosis present

## 2016-02-25 DIAGNOSIS — G40909 Epilepsy, unspecified, not intractable, without status epilepticus: Secondary | ICD-10-CM | POA: Diagnosis present

## 2016-02-25 DIAGNOSIS — Z818 Family history of other mental and behavioral disorders: Secondary | ICD-10-CM | POA: Diagnosis not present

## 2016-02-25 DIAGNOSIS — N186 End stage renal disease: Secondary | ICD-10-CM | POA: Diagnosis present

## 2016-02-25 DIAGNOSIS — F17210 Nicotine dependence, cigarettes, uncomplicated: Secondary | ICD-10-CM | POA: Diagnosis present

## 2016-02-25 DIAGNOSIS — Z79891 Long term (current) use of opiate analgesic: Secondary | ICD-10-CM | POA: Diagnosis not present

## 2016-02-25 DIAGNOSIS — Z825 Family history of asthma and other chronic lower respiratory diseases: Secondary | ICD-10-CM | POA: Diagnosis not present

## 2016-02-25 DIAGNOSIS — R531 Weakness: Secondary | ICD-10-CM

## 2016-02-25 DIAGNOSIS — F09 Unspecified mental disorder due to known physiological condition: Secondary | ICD-10-CM | POA: Diagnosis not present

## 2016-02-25 DIAGNOSIS — K922 Gastrointestinal hemorrhage, unspecified: Principal | ICD-10-CM | POA: Diagnosis present

## 2016-02-25 DIAGNOSIS — Z86718 Personal history of other venous thrombosis and embolism: Secondary | ICD-10-CM

## 2016-02-25 DIAGNOSIS — F02818 Dementia in other diseases classified elsewhere, unspecified severity, with other behavioral disturbance: Secondary | ICD-10-CM

## 2016-02-25 DIAGNOSIS — K649 Unspecified hemorrhoids: Secondary | ICD-10-CM | POA: Diagnosis present

## 2016-02-25 DIAGNOSIS — Z8249 Family history of ischemic heart disease and other diseases of the circulatory system: Secondary | ICD-10-CM | POA: Diagnosis not present

## 2016-02-25 DIAGNOSIS — Z888 Allergy status to other drugs, medicaments and biological substances status: Secondary | ICD-10-CM | POA: Diagnosis not present

## 2016-02-25 DIAGNOSIS — K210 Gastro-esophageal reflux disease with esophagitis: Secondary | ICD-10-CM | POA: Diagnosis present

## 2016-02-25 DIAGNOSIS — F0281 Dementia in other diseases classified elsewhere with behavioral disturbance: Secondary | ICD-10-CM | POA: Diagnosis present

## 2016-02-25 DIAGNOSIS — Z8673 Personal history of transient ischemic attack (TIA), and cerebral infarction without residual deficits: Secondary | ICD-10-CM | POA: Diagnosis not present

## 2016-02-25 DIAGNOSIS — E039 Hypothyroidism, unspecified: Secondary | ICD-10-CM | POA: Diagnosis present

## 2016-02-25 DIAGNOSIS — Z79899 Other long term (current) drug therapy: Secondary | ICD-10-CM

## 2016-02-25 DIAGNOSIS — E1122 Type 2 diabetes mellitus with diabetic chronic kidney disease: Secondary | ICD-10-CM | POA: Diagnosis present

## 2016-02-25 DIAGNOSIS — Z9049 Acquired absence of other specified parts of digestive tract: Secondary | ICD-10-CM

## 2016-02-25 DIAGNOSIS — Z7982 Long term (current) use of aspirin: Secondary | ICD-10-CM | POA: Diagnosis not present

## 2016-02-25 DIAGNOSIS — H540 Blindness, both eyes: Secondary | ICD-10-CM | POA: Diagnosis present

## 2016-02-25 DIAGNOSIS — D631 Anemia in chronic kidney disease: Secondary | ICD-10-CM | POA: Diagnosis present

## 2016-02-25 DIAGNOSIS — Z992 Dependence on renal dialysis: Secondary | ICD-10-CM | POA: Diagnosis not present

## 2016-02-25 DIAGNOSIS — K5900 Constipation, unspecified: Secondary | ICD-10-CM | POA: Diagnosis present

## 2016-02-25 DIAGNOSIS — D649 Anemia, unspecified: Secondary | ICD-10-CM | POA: Diagnosis present

## 2016-02-25 DIAGNOSIS — M797 Fibromyalgia: Secondary | ICD-10-CM | POA: Diagnosis present

## 2016-02-25 LAB — BASIC METABOLIC PANEL
Anion gap: 11 (ref 5–15)
BUN: 51 mg/dL — AB (ref 6–20)
CALCIUM: 7.8 mg/dL — AB (ref 8.9–10.3)
CO2: 25 mmol/L (ref 22–32)
CREATININE: 4.73 mg/dL — AB (ref 0.44–1.00)
Chloride: 99 mmol/L — ABNORMAL LOW (ref 101–111)
GFR calc Af Amer: 12 mL/min — ABNORMAL LOW (ref >60)
GFR, EST NON AFRICAN AMERICAN: 10 mL/min — AB (ref >60)
GLUCOSE: 73 mg/dL (ref 65–99)
Potassium: 3.5 mmol/L (ref 3.5–5.1)
Sodium: 135 mmol/L (ref 135–145)

## 2016-02-25 LAB — CBC
HCT: 15.6 % — ABNORMAL LOW (ref 35.0–47.0)
Hemoglobin: 5.1 g/dL — ABNORMAL LOW (ref 12.0–16.0)
MCH: 30.2 pg (ref 26.0–34.0)
MCHC: 32.9 g/dL (ref 32.0–36.0)
MCV: 91.7 fL (ref 80.0–100.0)
PLATELETS: 232 10*3/uL (ref 150–440)
RBC: 1.7 MIL/uL — ABNORMAL LOW (ref 3.80–5.20)
RDW: 16.7 % — AB (ref 11.5–14.5)
WBC: 11.1 10*3/uL — ABNORMAL HIGH (ref 3.6–11.0)

## 2016-02-25 LAB — TROPONIN I: TROPONIN I: 0.05 ng/mL — AB (ref <0.031)

## 2016-02-25 LAB — AMMONIA: AMMONIA: 24 umol/L (ref 9–35)

## 2016-02-25 LAB — PREPARE RBC (CROSSMATCH)

## 2016-02-25 LAB — ABO/RH: ABO/RH(D): A POS

## 2016-02-25 MED ORDER — HYDROMORPHONE HCL 2 MG PO TABS
6.0000 mg | ORAL_TABLET | Freq: Four times a day (QID) | ORAL | Status: DC | PRN
  Administered 2016-02-26 – 2016-03-01 (×12): 6 mg via ORAL
  Filled 2016-02-25 (×14): qty 3

## 2016-02-25 MED ORDER — LEVOTHYROXINE SODIUM 112 MCG PO TABS
112.0000 ug | ORAL_TABLET | Freq: Every day | ORAL | Status: DC
  Administered 2016-02-27 – 2016-03-01 (×3): 112 ug via ORAL
  Filled 2016-02-25 (×3): qty 1

## 2016-02-25 MED ORDER — GUAIFENESIN ER 600 MG PO TB12: 600.0000 mg | ORAL_TABLET | Freq: Two times a day (BID) | ORAL | Status: DC | PRN

## 2016-02-25 MED ORDER — MELATONIN 5 MG PO TABS: 2.0000 | ORAL_TABLET | Freq: Every day | ORAL | Status: DC

## 2016-02-25 MED ORDER — OXYCODONE HCL 5 MG PO TABS
10.0000 mg | ORAL_TABLET | Freq: Three times a day (TID) | ORAL | Status: DC
  Administered 2016-02-25 – 2016-02-27 (×7): 10 mg via ORAL
  Filled 2016-02-25 (×11): qty 2

## 2016-02-25 MED ORDER — MOMETASONE FURO-FORMOTEROL FUM 100-5 MCG/ACT IN AERO
2.0000 | INHALATION_SPRAY | Freq: Two times a day (BID) | RESPIRATORY_TRACT | Status: DC
  Administered 2016-02-26 – 2016-03-01 (×5): 2 via RESPIRATORY_TRACT
  Filled 2016-02-25: qty 8.8

## 2016-02-25 MED ORDER — HYDROXYZINE HCL 50 MG PO TABS
25.0000 mg | ORAL_TABLET | Freq: Three times a day (TID) | ORAL | Status: DC
  Administered 2016-02-26 – 2016-02-29 (×3): 25 mg via ORAL
  Filled 2016-02-25 (×10): qty 1

## 2016-02-25 MED ORDER — ONDANSETRON HCL 4 MG/2ML IJ SOLN
4.0000 mg | Freq: Four times a day (QID) | INTRAMUSCULAR | Status: DC | PRN
  Administered 2016-02-27: 4 mg via INTRAVENOUS
  Filled 2016-02-25: qty 2

## 2016-02-25 MED ORDER — CYCLOBENZAPRINE HCL 10 MG PO TABS
10.0000 mg | ORAL_TABLET | Freq: Three times a day (TID) | ORAL | Status: DC | PRN
  Administered 2016-02-28: 10 mg via ORAL
  Filled 2016-02-25: qty 1

## 2016-02-25 MED ORDER — METHYLPHENIDATE HCL 5 MG PO TABS
20.0000 mg | ORAL_TABLET | Freq: Every day | ORAL | Status: DC
  Administered 2016-02-26 – 2016-02-29 (×3): 20 mg via ORAL
  Filled 2016-02-25 (×2): qty 4

## 2016-02-25 MED ORDER — ALBUTEROL SULFATE HFA 108 (90 BASE) MCG/ACT IN AERS: 2.0000 | INHALATION_SPRAY | Freq: Four times a day (QID) | RESPIRATORY_TRACT | Status: DC | PRN

## 2016-02-25 MED ORDER — SODIUM CHLORIDE 0.9 % IV SOLN: Freq: Once | INTRAVENOUS | Status: DC

## 2016-02-25 MED ORDER — RISPERIDONE 1 MG PO TABS
2.0000 mg | ORAL_TABLET | Freq: Every day | ORAL | Status: DC
  Administered 2016-02-25 – 2016-02-29 (×5): 2 mg via ORAL
  Filled 2016-02-25 (×5): qty 2

## 2016-02-25 MED ORDER — ESTRADIOL 0.1 MG/GM VA CREA
1.0000 | TOPICAL_CREAM | VAGINAL | Status: DC
  Filled 2016-02-25: qty 42.5

## 2016-02-25 MED ORDER — GUAIFENESIN 100 MG/5ML PO SYRP
200.0000 mg | ORAL_SOLUTION | Freq: Four times a day (QID) | ORAL | Status: DC | PRN
  Filled 2016-02-25: qty 10

## 2016-02-25 MED ORDER — EPOETIN ALFA 10000 UNIT/ML IJ SOLN: 10000.0000 [IU] | INTRAMUSCULAR | Status: DC

## 2016-02-25 MED ORDER — ALBUTEROL SULFATE (2.5 MG/3ML) 0.083% IN NEBU: 2.5000 mg | INHALATION_SOLUTION | Freq: Four times a day (QID) | RESPIRATORY_TRACT | Status: DC | PRN

## 2016-02-25 MED ORDER — TOPIRAMATE 25 MG PO TABS
50.0000 mg | ORAL_TABLET | Freq: Two times a day (BID) | ORAL | Status: DC
  Administered 2016-02-25 – 2016-02-29 (×8): 50 mg via ORAL
  Filled 2016-02-25 (×10): qty 2

## 2016-02-25 MED ORDER — RISPERIDONE 1 MG PO TABS
4.0000 mg | ORAL_TABLET | Freq: Every morning | ORAL | Status: DC
Start: 2016-02-26 — End: 2016-03-01
  Administered 2016-02-26 – 2016-03-01 (×4): 4 mg via ORAL
  Filled 2016-02-25 (×4): qty 4

## 2016-02-25 MED ORDER — METHYLPHENIDATE HCL 5 MG PO TABS
30.0000 mg | ORAL_TABLET | Freq: Every day | ORAL | Status: DC
  Administered 2016-02-27 – 2016-03-01 (×3): 30 mg via ORAL
  Filled 2016-02-25 (×4): qty 6

## 2016-02-25 MED ORDER — ADULT MULTIVITAMIN W/MINERALS CH
1.0000 | ORAL_TABLET | Freq: Every day | ORAL | Status: DC
  Filled 2016-02-25 (×3): qty 1

## 2016-02-25 MED ORDER — HYDROCORTISONE 10 MG PO TABS
10.0000 mg | ORAL_TABLET | Freq: Every morning | ORAL | Status: DC
  Administered 2016-02-26 – 2016-03-01 (×4): 10 mg via ORAL
  Filled 2016-02-25 (×4): qty 1

## 2016-02-25 MED ORDER — SERTRALINE HCL 50 MG PO TABS
50.0000 mg | ORAL_TABLET | Freq: Every day | ORAL | Status: DC
  Administered 2016-02-26 – 2016-03-01 (×4): 50 mg via ORAL
  Filled 2016-02-25 (×4): qty 1

## 2016-02-25 MED ORDER — ACETAMINOPHEN 325 MG PO TABS
650.0000 mg | ORAL_TABLET | Freq: Four times a day (QID) | ORAL | Status: DC | PRN
  Administered 2016-03-01: 650 mg via ORAL
  Filled 2016-02-25 (×2): qty 2

## 2016-02-25 MED ORDER — ONDANSETRON HCL 4 MG PO TABS: 4.0000 mg | ORAL_TABLET | Freq: Four times a day (QID) | ORAL | Status: DC | PRN

## 2016-02-25 MED ORDER — SEVELAMER CARBONATE 800 MG PO TABS
4000.0000 mg | ORAL_TABLET | Freq: Three times a day (TID) | ORAL | Status: DC
  Administered 2016-02-26 – 2016-02-28 (×5): 4000 mg via ORAL
  Filled 2016-02-25 (×6): qty 5

## 2016-02-25 MED ORDER — POLYETHYLENE GLYCOL 3350 17 G PO PACK: 17.0000 g | PACK | Freq: Every day | ORAL | Status: DC | PRN

## 2016-02-25 MED ORDER — FLUDROCORTISONE ACETATE 0.1 MG PO TABS
0.1000 mg | ORAL_TABLET | Freq: Every day | ORAL | Status: DC
  Administered 2016-02-26 – 2016-03-01 (×4): 0.1 mg via ORAL
  Filled 2016-02-25 (×4): qty 1

## 2016-02-25 MED ORDER — PRAVASTATIN SODIUM 20 MG PO TABS
40.0000 mg | ORAL_TABLET | Freq: Every day | ORAL | Status: DC
  Administered 2016-02-26 – 2016-02-29 (×4): 40 mg via ORAL
  Filled 2016-02-25 (×4): qty 2

## 2016-02-25 MED ORDER — GABAPENTIN 400 MG PO CAPS
400.0000 mg | ORAL_CAPSULE | Freq: Every day | ORAL | Status: DC
  Administered 2016-02-25 – 2016-02-29 (×5): 400 mg via ORAL
  Filled 2016-02-25 (×5): qty 1

## 2016-02-25 MED ORDER — ACETAMINOPHEN 650 MG RE SUPP: 650.0000 mg | Freq: Four times a day (QID) | RECTAL | Status: DC | PRN

## 2016-02-25 MED ORDER — TOPIRAMATE 100 MG PO TABS
200.0000 mg | ORAL_TABLET | Freq: Two times a day (BID) | ORAL | Status: DC
  Administered 2016-02-25 – 2016-03-01 (×9): 200 mg via ORAL
  Filled 2016-02-25 (×10): qty 2

## 2016-02-25 MED ORDER — VITAMIN D (ERGOCALCIFEROL) 1.25 MG (50000 UNIT) PO CAPS
50000.0000 [IU] | ORAL_CAPSULE | ORAL | Status: DC
  Filled 2016-02-25: qty 1

## 2016-02-25 MED ORDER — LORATADINE 10 MG PO TABS
10.0000 mg | ORAL_TABLET | Freq: Every day | ORAL | Status: DC
  Administered 2016-02-26 – 2016-03-01 (×4): 10 mg via ORAL
  Filled 2016-02-25 (×4): qty 1

## 2016-02-25 MED ORDER — HYDROCORTISONE 10 MG PO TABS
5.0000 mg | ORAL_TABLET | Freq: Every day | ORAL | Status: DC
  Administered 2016-02-25 – 2016-02-29 (×5): 5 mg via ORAL
  Filled 2016-02-25 (×5): qty 1

## 2016-02-25 MED ORDER — FUROSEMIDE 40 MG PO TABS
40.0000 mg | ORAL_TABLET | Freq: Two times a day (BID) | ORAL | Status: DC
  Administered 2016-02-26 – 2016-03-01 (×6): 40 mg via ORAL
  Filled 2016-02-25 (×6): qty 1

## 2016-02-25 MED ORDER — ONDANSETRON 8 MG PO TBDP: 8.0000 mg | ORAL_TABLET | Freq: Three times a day (TID) | ORAL | Status: DC | PRN

## 2016-02-25 MED ORDER — FENOFIBRATE 160 MG PO TABS
160.0000 mg | ORAL_TABLET | Freq: Every day | ORAL | Status: DC
  Administered 2016-02-26 – 2016-02-28 (×3): 160 mg via ORAL
  Filled 2016-02-25 (×4): qty 1

## 2016-02-25 NOTE — ED Notes (Signed)
Patient brief changed, patient had old dry stool that appeared dark in color with red streaks.  Dr. Fanny Bien notified.

## 2016-02-25 NOTE — Progress Notes (Signed)
Blood transfusion completed.

## 2016-02-25 NOTE — Progress Notes (Signed)
Post hd tx 

## 2016-02-25 NOTE — ED Notes (Signed)
Pt arrived via EMS from [facility]. EMS reports patient was wiping feces on the walls and was running into the wall.  Pt is unsure when she last had HD.  Per EMS pt goes on MWF.  She has a right upper arm graft and left chest perm cath that is unable to be removed per pt and is due to be seen by IR for removal.  Per EMS staff on duty state the patient has had a change in her level of functioning for the past week.  Pt is blind since age 43 months. Staff also state she has had an increase in swelling to her lower extremities and has had a decrease in her oxycontin dose.

## 2016-02-25 NOTE — ED Notes (Signed)
Dr. Fanny Bien notified of elevated troponin

## 2016-02-25 NOTE — ED Provider Notes (Signed)
[facility] Emergency Department Provider Note  ____________________________________________  Time seen: Approximately 1:11 PM  I have reviewed the triage vital signs and the nursing notes.   HISTORY  Chief Complaint Altered Mental Status and Chest Pain    HPI Erin [name] is a 45 y.o. female sense for evaluation of possibly some confusion.  The patient reports that she lives at [facility] assisted [facility], they called 911 because they were concerned about her. She reports she feels weak, and has chronic pain in her back due to severe fibromyalgia. She also reports that she has dialysis Monday Wednesday Friday, but she is not quite sure if she got yesterday.  She denies any bleeding, denies any vomiting, denies any abdominal pain or headaches. She reports she has a dialysis catheter in the left upper chest that's due to be removed at the [place] [facility], but they had some sort of complication when had difficulty getting it removed previous so it's going to be done in interventional radiology.  She rates her pain as moderate to severe, achy and in the muscles of her back. This is a chronic pain and discomfort. She is also blind.  The patient denies confusion, but states that she does have generalized weakness and has been more severe the last few days.  She also reports she's had a history of blood transfusion when she feels tired, and that sometimes her platelets have been low. They told her her blood counts are low due to being on dialysis as well.   Past Medical History  Diagnosis Date  . Fibromyalgia   . Chronic headaches     Seeing Pain Management  . Hyperlipidemia   . Psychosis   . Hypothyroid   . Narcolepsy   . Asthma   . Constipation   . Kidney failure     Currently on Dialysis  . Blind   . Epilepsy (HCC)   . History of DVT (deep vein thrombosis)   . History of CVA (cerebrovascular accident)   . Depression   . Bipolar  affect, depressed (HCC)   . Anxiety     There are no active problems to display for this patient.   Past Surgical History  Procedure Laterality Date  . Brain surgery  1976  . Cholecystectomy  2001  . Breast lumpectomy  2001  . Pituitary excision  1976  . Dg av dialysis graft declot or      X 4  . Peripheral vascular catheterization N/A 08/21/2015    Procedure: Dialysis/Perma Catheter Insertion;  Surgeon: Renford Dills, MD;  Location: [facility] INVASIVE CV LAB;  Service: Cardiovascular;  Laterality: N/A;    Current Outpatient Rx  Name  Route  Sig  Dispense  Refill  . albuterol (PROVENTIL HFA;VENTOLIN HFA) 108 (90 BASE) MCG/ACT inhaler   Inhalation   Inhale 2 puffs into the lungs every 6 (six) hours as needed for wheezing or shortness of breath.         Marland Kitchen albuterol (PROVENTIL) (2.5 MG/3ML) 0.083% nebulizer solution   Nebulization   Take 2.5 mg by nebulization every 6 (six) hours as needed for wheezing or shortness of breath.         Marland Kitchen aspirin 81 MG chewable tablet   Oral   Chew 81 mg by mouth daily.         . budesonide-formoterol (SYMBICORT) 80-4.5 MCG/ACT inhaler   Inhalation   Inhale 2 puffs into the lungs 2 (two) times daily.         Marland Kitchen  cyclobenzaprine (FLEXERIL) 10 MG tablet   Oral   Take 10 mg by mouth 3 (three) times daily as needed for muscle spasms.         . Emollient (EQL ADVANCED RECOVERY) LOTN   Apply externally   Apply 1 application topically 2 (two) times daily.         Marland Kitchen estradiol (ESTRACE) 0.1 MG/GM vaginal cream   Vaginal   Place 1 Applicatorful vaginally 2 (two) times a week.         . fenofibrate (TRICOR) 145 MG tablet   Oral   Take 145 mg by mouth every other day.          . fludrocortisone (FLORINEF) 0.1 MG tablet   Oral   Take 0.1 mg by mouth daily.         . folic acid-vitamin b complex-vitamin c-selenium-zinc (DIALYVITE) 3 MG TABS tablet   Oral   Take 1 tablet by mouth daily.         . furosemide (LASIX) 40 MG  tablet   Oral   Take 40 mg by mouth 2 (two) times daily.         Marland Kitchen gabapentin (NEURONTIN) 100 MG capsule   Oral   Take 400 mg by mouth at bedtime.         Marland Kitchen guaiFENesin (MUCINEX) 600 MG 12 hr tablet   Oral   Take 1 tablet (600 mg total) by mouth 2 (two) times daily as needed.   60 tablet   2   . guaifenesin (ROBITUSSIN) 100 MG/5ML syrup   Oral   Take 200 mg by mouth every 6 (six) hours as needed for cough.         . hydrocortisone (CORTEF) 10 MG tablet   Oral   Take 5-10 mg by mouth 2 (two) times daily. Pt takes 10 mg in the morning and 5 mg at bedtime.         Marland Kitchen HYDROmorphone (DILAUDID) 4 MG tablet   Oral   Take 6 mg by mouth every 6 (six) hours as needed for severe pain.          . hydrOXYzine (ATARAX/VISTARIL) 25 MG tablet   Oral   Take 25 mg by mouth 3 (three) times daily.         Marland Kitchen levothyroxine (SYNTHROID, LEVOTHROID) 112 MCG tablet   Oral   Take 112 mcg by mouth daily.          Marland Kitchen loratadine (CLARITIN) 10 MG tablet   Oral   Take 10 mg by mouth daily.         Marland Kitchen lovastatin (MEVACOR) 40 MG tablet   Oral   Take 20 mg by mouth every evening.          . Melatonin 5 MG TABS   Oral   Take 2 tablets by mouth at bedtime.         . methylphenidate (RITALIN) 20 MG tablet   Oral   Take 20-30 mg by mouth 2 (two) times daily. Pt takes 30 mg in the morning and 20 mg at lunch.         . ondansetron (ZOFRAN-ODT) 8 MG disintegrating tablet   Oral   Take 8 mg by mouth every 8 (eight) hours as needed for nausea or vomiting.         . Oxycodone HCl 10 MG TABS   Oral   Take 10 mg by mouth every 8 (eight) hours.         Marland Kitchen  Phenylephrine-Acetaminophen (SUDAFED PE PRESSURE + PAIN) 5-325 MG TABS   Oral   Take 1-2 tablets by mouth every 4 (four) hours as needed.         . polyethylene glycol (MIRALAX / GLYCOLAX) packet   Oral   Take 17 g by mouth daily as needed for mild constipation.          . risperidone (RISPERDAL) 4 MG tablet   Oral    Take 2-4 mg by mouth 2 (two) times daily. Pt takes 4 mg in the morning and 2 mg at bedtime.         . sertraline (ZOLOFT) 50 MG tablet   Oral   Take 50 mg by mouth daily.         . sevelamer carbonate (RENVELA) 800 MG tablet   Oral   Take 4,000 mg by mouth 3 (three) times daily with meals.         . topiramate (TOPAMAX) 200 MG tablet   Oral   Take 200 mg by mouth 2 (two) times daily. Pt takes with a 50 mg tablet.         . topiramate (TOPAMAX) 50 MG tablet   Oral   Take 50 mg by mouth 2 (two) times daily. Pt takes with a 200 mg tablet.         . Vitamin D, Ergocalciferol, (DRISDOL) 50000 UNITS CAPS capsule   Oral   Take 50,000 Units by mouth every 30 (thirty) days.           Allergies Dhea; Demerol; Floxin; Nsaids; Nubain; Phenergan; Stadol; and Erythromycin  History reviewed. No pertinent family history.  Social History Social History  Substance Use Topics  . Smoking status: Current Every Day Smoker -- 0.50 packs/day for 16 years    Types: Cigarettes  . Smokeless tobacco: None  . Alcohol Use: No     Comment: occasionally    Review of Systems Constitutional: No fever/chills Eyes: Chronic blindness ENT: No sore throat. Cardiovascular: Denies chest pain. Respiratory: Denies shortness of breath. Gastrointestinal: No abdominal pain.  No nausea, no vomiting.  No diarrhea.  No constipation. Genitourinary: Negative for dysuria.She does make small amounts of urine from time to time Musculoskeletal: Chronic back pain from fibromyalgia Skin: Negative for rash. Neurological: Negative for headaches, focal weakness or numbness.  10-point ROS otherwise negative.  ____________________________________________   PHYSICAL EXAM:  VITAL SIGNS: ED Triage Vitals  Enc Vitals Group     BP 02/25/16 1155 102/62 mmHg     Pulse Rate 02/25/16 1155 87     Resp 02/25/16 1155 16     Temp 02/25/16 1155 98.1 F (36.7 C)     Temp Source 02/25/16 1155 Oral     SpO2 02/25/16  1155 99 %     Weight 02/25/16 1155 186 lb 4.6 oz (84.5 kg)     Height 02/25/16 1155 4\' 10"  (1.473 m)     Head Cir --      Peak Flow --      Pain Score 02/25/16 1157 9     Pain Loc --      Pain Edu? --      Excl. in GC? --    Constitutional: Alert and orientedTo self, place, and year. Chronically ill-appearing, pleasant and in no acute distress. Eyes: Conjunctivae are normal though slightly pale. Her extraocular movements are conjugate, but she states she is chronically blind, and she does not track examiner Head: Atraumatic. Nose: No congestion/rhinnorhea. Mouth/Throat: Mucous membranes  are slightly dry.  Oropharynx non-erythematous. Neck: No stridor.   Cardiovascular: Normal rate, regular rhythm. Grossly normal heart sounds.  Good peripheral circulation. Respiratory: Normal respiratory effort.  No retractions. Lungs CTAB. Gastrointestinal: Soft and nontender. No abdominal bruits. No CVA tenderness. A small amount of stool is noted in her diaper with a small amount of blood associated. Musculoskeletal: No lower extremity tenderness with 2+ lower extremity edema bilateral.   Neurologic:  Normal speech and language. No gross focal neurologic deficits are appreciated.  Skin:  Skin is warm, dry and intact. No rash noted. Psychiatric: Mood and affect are normal. Speech and behavior are normal.  ____________________________________________   LABS (all labs ordered are listed, but only abnormal results are displayed)  Labs Reviewed  BASIC METABOLIC PANEL - Abnormal; Notable for the following:    Chloride 99 (*)    BUN 51 (*)    Creatinine, Ser 4.73 (*)    Calcium 7.8 (*)    GFR calc non Af Amer 10 (*)    GFR calc Af Amer 12 (*)    All other components within normal limits  CBC - Abnormal; Notable for the following:    WBC 11.1 (*)    RBC 1.70 (*)    Hemoglobin 5.1 (*)    HCT 15.6 (*)    RDW 16.7 (*)    All other components within normal limits  TROPONIN I - Abnormal; Notable for  the following:    Troponin I 0.05 (*)    All other components within normal limits  AMMONIA  URINALYSIS COMPLETEWITH MICROSCOPIC ([facility] ONLY)  TYPE AND SCREEN   ____________________________________________  EKG  Reviewed and to revive me at 1205 Heart rate 80 QRS 100 QTc 48 Reviewed and interpreted as incomplete right bundle branch block with expected T wave abnormality associated with mild inversion V2 and V3, there is no evidence of acute ST abnormality or ischemic change. ____________________________________________  RADIOLOGY   ____________________________________________   PROCEDURES  Procedure(s) performed: None  Critical Care performed: No  ____________________________________________   INITIAL IMPRESSION / ASSESSMENT AND PLAN / ED COURSE  Pertinent labs & imaging results that were available during my care of the patient were reviewed by me and considered in my medical decision making (see chart for details).  Patient presents for reportedly some confusion. Her complaint here seems to be that of generalized weakness, and she does appear well oriented and in no distress. Conjunctiva are pale mucous membranes slightly dry however. She does complain of generalized weakness worsening over the last few days, and she doesn't recall when she last had dialysis which could indicate some mild confusion.  Her exam is reassuring, though she certainly has many stigmata of chronic illness and appears chronically ill.  She is not complaining any chest pain or pulmonary symptoms. She has no abdominal discomfort. Her back pain appears to be that of chronic pain, she reports some change and usually takes to a lot of for this.  Other labs do demonstrate new hemoglobin of 5, this privately correlates with some of her symptoms possibly of some confusion and also her generalized weakness. Is notably a small amount of blood noted with her stooling, question if this may be from GI blood  loss versus anemia of chronic disease given her multiple medical problems and end-stage renal disease.  Discussed with the patient hospital service, we will admit her for ongoing care and management. She is hemodynamically stable and hospital policy does not allow for crossmatch blood transfusion in  our ER presently, this will admit her the hospital floor anticipate transfusion to begin his inpatient area will initiate emergent blood here if the patient becomes unstable though at this time find no evidence of instability requiring emergent transfusion. ____________________________________________   FINAL CLINICAL IMPRESSION(S) / ED DIAGNOSES  Final diagnoses:  General weakness  Anemia, unspecified anemia type      Sharyn Creamer, MD 02/25/16 1322

## 2016-02-25 NOTE — ED Notes (Addendum)
Attempted In and Out Cath x1 unsuccessful at this time.  Patient did not tolerate and c/o pain.   At this time Dr. Fanny Bien stated urine sample not needed at this time.

## 2016-02-25 NOTE — H&P (Signed)
[facility] [place] at [facility]   PATIENT NAME: Erin [name]    MR#:  [id number]  DATE OF BIRTH:  [birth year]  DATE OF ADMISSION:  02/25/2016  PRIMARY CARE PHYSICIAN: Pcp Not In System   REQUESTING/REFERRING PHYSICIAN: Dr Fanny Bien  CHIEF COMPLAINT:  I was confused today.  HISTORY OF PRESENT ILLNESS:  Erin [name]  is a 45 y.o. 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 Patient was found to be confused or staff. At present emergency room she appears well oriented. There is no episode of confusion or psychosis noted. Workup in the ER showed hemoglobin of 5.1. Patient reports having some bloody smears were hard D pants at the facility. She did not have a rectal exam done here however the vibratory there were some bloody discharge noted through the rectal area. No hematemesis. Patient had EGD in August 2016 at [facility] which did not show any acute lesions. Her colonoscopy in 2016 August was negative other than a few polyps. She was admitted at that time with GI bleed source of GI bleed was small intestinal. Patient is being admitted for further evaluation and management. She reportedly keeps asking for her Dilaudid while in the emergency room.  PAST MEDICAL HISTORY:   Past Medical History  Diagnosis Date  . Fibromyalgia   . Chronic headaches     Seeing Pain Management  . Hyperlipidemia   . Psychosis   . Hypothyroid   . Narcolepsy   . Asthma   . Constipation   . Kidney failure     Currently on Dialysis  . Blind   . Epilepsy (HCC)   . History of DVT (deep vein thrombosis)   . History of CVA (cerebrovascular accident)   . Depression   . Bipolar  affect, depressed (HCC)   . Anxiety     PAST SURGICAL HISTOIRY:   Past Surgical History  Procedure Laterality Date  . Brain surgery  1976  . Cholecystectomy  2001  . Breast lumpectomy  2001  . Pituitary excision  1976  . Dg av dialysis graft declot or      X 4  . Peripheral vascular catheterization N/A 08/21/2015    Procedure: Dialysis/Perma Catheter Insertion;  Surgeon: Renford Dills, MD;  Location: [facility] INVASIVE CV LAB;  Service: Cardiovascular;  Laterality: N/A;    SOCIAL HISTORY:   Social History  Substance Use Topics  . Smoking status: Current Every Day Smoker -- 0.50 packs/day for 16 years    Types: Cigarettes  . Smokeless tobacco: Not on file  . Alcohol Use: No     Comment: occasionally    FAMILY HISTORY:  Hypertension type 2 diabetes in father. Alcohol abuse asthma seizures heart attack and mother. A brother has bipolar disorder.  DRUG ALLERGIES:   Allergies  Allergen Reactions  . Dhea [Nutritional Supplements] Anaphylaxis    Patient states medication is DHE for migraines, not DHEA  . Demerol [Meperidine] Other (See Comments)    Reaction:  Hallucinations   . Floxin [Ofloxacin] Other (See Comments)    Reaction:  Hallucinations   . Nsaids Nausea And Vomiting and Swelling  . Nubain [Nalbuphine Hcl] Other (See Comments)    Reaction:  Hallucinations   . Phenergan [Promethazine Hcl]  Other (See Comments)    Reaction:  Restless legs   . Stadol [Butorphanol] Other (See Comments)    Reaction:  Hallucinations   . Erythromycin Diarrhea and Rash    REVIEW OF SYSTEMS:  Review of Systems  Constitutional: Negative for fever, chills and weight loss.  HENT: Negative for ear discharge, ear pain and nosebleeds.   Eyes: Negative for blurred vision, pain and discharge.  Respiratory: Negative for sputum production, shortness of breath, wheezing and stridor.   Cardiovascular: Negative for chest pain, palpitations, orthopnea and PND.  Gastrointestinal: Positive for blood  in stool. Negative for nausea, vomiting, abdominal pain and diarrhea.  Genitourinary: Negative for urgency and frequency.  Musculoskeletal: Negative for back pain and joint pain.  Neurological: Negative for sensory change, speech change, focal weakness and weakness.  Psychiatric/Behavioral: Negative for depression and hallucinations. The patient is not nervous/anxious.   All other systems reviewed and are negative.    MEDICATIONS AT HOME:   Prior to Admission medications   Medication Sig Start Date End Date Taking? Authorizing Provider  albuterol (PROVENTIL HFA;VENTOLIN HFA) 108 (90 BASE) MCG/ACT inhaler Inhale 2 puffs into the lungs every 6 (six) hours as needed for wheezing or shortness of breath.   Yes Historical Provider, MD  albuterol (PROVENTIL) (2.5 MG/3ML) 0.083% nebulizer solution Take 2.5 mg by nebulization every 6 (six) hours as needed for wheezing or shortness of breath.   Yes Historical Provider, MD  aspirin 81 MG chewable tablet Chew 81 mg by mouth daily.   Yes Historical Provider, MD  budesonide-formoterol (SYMBICORT) 80-4.5 MCG/ACT inhaler Inhale 2 puffs into the lungs 2 (two) times daily.   Yes Historical Provider, MD  cyclobenzaprine (FLEXERIL) 10 MG tablet Take 10 mg by mouth 3 (three) times daily as needed for muscle spasms.   Yes Historical Provider, MD  Emollient (EQL ADVANCED RECOVERY) LOTN Apply 1 application topically 2 (two) times daily.   Yes Historical Provider, MD  estradiol (ESTRACE) 0.1 MG/GM vaginal cream Place 1 Applicatorful vaginally 2 (two) times a week.   Yes Historical Provider, MD  fenofibrate (TRICOR) 145 MG tablet Take 145 mg by mouth every other day.    Yes Historical Provider, MD  fludrocortisone (FLORINEF) 0.1 MG tablet Take 0.1 mg by mouth daily.   Yes Historical Provider, MD  folic acid-vitamin b complex-vitamin c-selenium-zinc (DIALYVITE) 3 MG TABS tablet Take 1 tablet by mouth daily.   Yes Historical Provider, MD  furosemide (LASIX) 40 MG tablet  Take 40 mg by mouth 2 (two) times daily.   Yes Historical Provider, MD  gabapentin (NEURONTIN) 100 MG capsule Take 400 mg by mouth at bedtime.   Yes Historical Provider, MD  guaiFENesin (MUCINEX) 600 MG 12 hr tablet Take 1 tablet (600 mg total) by mouth 2 (two) times daily as needed. 11/04/15 11/03/16 Yes Darci Current, MD  guaifenesin (ROBITUSSIN) 100 MG/5ML syrup Take 200 mg by mouth every 6 (six) hours as needed for cough.   Yes Historical Provider, MD  hydrocortisone (CORTEF) 10 MG tablet Take 5-10 mg by mouth 2 (two) times daily. Pt takes 10 mg in the morning and 5 mg at bedtime.   Yes Historical Provider, MD  HYDROmorphone (DILAUDID) 4 MG tablet Take 6 mg by mouth every 6 (six) hours as needed for severe pain.    Yes Historical Provider, MD  hydrOXYzine (ATARAX/VISTARIL) 25 MG tablet Take 25 mg by mouth 3 (three) times daily.   Yes Historical Provider, MD  levothyroxine (SYNTHROID, LEVOTHROID) 112 MCG tablet Take  112 mcg by mouth daily.    Yes Historical Provider, MD  loratadine (CLARITIN) 10 MG tablet Take 10 mg by mouth daily.   Yes Historical Provider, MD  lovastatin (MEVACOR) 40 MG tablet Take 20 mg by mouth every evening.    Yes Historical Provider, MD  Melatonin 5 MG TABS Take 2 tablets by mouth at bedtime.   Yes Historical Provider, MD  methylphenidate (RITALIN) 20 MG tablet Take 20-30 mg by mouth 2 (two) times daily. Pt takes 30 mg in the morning and 20 mg at lunch.   Yes Historical Provider, MD  Multiple Vitamin (MULTIVITAMIN WITH MINERALS) TABS tablet Take 1 tablet by mouth daily.   Yes Historical Provider, MD  ondansetron (ZOFRAN-ODT) 8 MG disintegrating tablet Take 8 mg by mouth every 8 (eight) hours as needed for nausea or vomiting.   Yes Historical Provider, MD  Oxycodone HCl 10 MG TABS Take 10 mg by mouth every 8 (eight) hours. 02/23/16 02/29/16 Yes Historical Provider, MD  Phenylephrine-Acetaminophen (SUDAFED PE PRESSURE + PAIN) 5-325 MG TABS Take 1-2 tablets by mouth every 4 (four)  hours as needed.   Yes Historical Provider, MD  polyethylene glycol (MIRALAX / GLYCOLAX) packet Take 17 g by mouth daily as needed for mild constipation.    Yes Historical Provider, MD  risperidone (RISPERDAL) 4 MG tablet Take 2-4 mg by mouth 2 (two) times daily. Pt takes 4 mg in the morning and 2 mg at bedtime.   Yes Historical Provider, MD  sertraline (ZOLOFT) 50 MG tablet Take 50 mg by mouth daily.   Yes Historical Provider, MD  sevelamer carbonate (RENVELA) 800 MG tablet Take 4,000 mg by mouth 3 (three) times daily with meals.   Yes Historical Provider, MD  topiramate (TOPAMAX) 200 MG tablet Take 200 mg by mouth 2 (two) times daily. Pt takes with a 50 mg tablet.   Yes Historical Provider, MD  topiramate (TOPAMAX) 50 MG tablet Take 50 mg by mouth 2 (two) times daily. Pt takes with a 200 mg tablet.   Yes Historical Provider, MD  Vitamin D, Ergocalciferol, (DRISDOL) 50000 UNITS CAPS capsule Take 50,000 Units by mouth every 30 (thirty) days.   Yes Historical Provider, MD      VITAL SIGNS:  Blood pressure 102/62, pulse 87, temperature 98.1 F (36.7 C), temperature source Oral, resp. rate 16, height 4\' 10"  (1.473 m), weight 84.5 kg (186 lb 4.6 oz), last menstrual period 11/21/2007, SpO2 99 %.  PHYSICAL EXAMINATION:  GENERAL:  45 y.o.-year-old patient lying in the bed with no acute distress. Generalized pallor EYES: Pupils equal, round, reactive to light and accommodation. No scleral icterus. Extraocular muscles intact.  HEENT: Head atraumatic, normocephalic. Oropharynx and nasopharynx clear.  NECK:  Supple, no jugular venous distention. No thyroid enlargement, no tenderness.  LUNGS: Normal breath sounds bilaterally, no wheezing, rales,rhonchi or crepitation. No use of accessory muscles of respiration.  CARDIOVASCULAR: S1, S2 normal. No murmurs, rubs, or gallops.  ABDOMEN: Soft, nontender, nondistended. Bowel sounds present. No organomegaly or mass.  EXTREMITIES: Chronic pedal edema, no cyanosis,  or clubbing.  NEUROLOGIC: Cranial nerves II through XII are intact. Muscle strength 5/5 in all extremities. Sensation intact. Gait not checked.  PSYCHIATRIC: The patient is alert and oriented x 2.  SKIN: No obvious rash, lesion, or ulcer.   LABORATORY PANEL:   CBC  Recent Labs Lab 02/25/16 1218  WBC 11.1*  HGB 5.1*  HCT 15.6*  PLT 232   ------------------------------------------------------------------------------------------------------------------  Chemistries   Recent Labs Lab  02/25/16 1218  NA 135  K 3.5  CL 99*  CO2 25  GLUCOSE 73  BUN 51*  CREATININE 4.73*  CALCIUM 7.8*   ------------------------------------------------------------------------------------------------------------------  Cardiac Enzymes  Recent Labs Lab 02/25/16 1218  TROPONINI 0.05*   ------------------------------------------------------------------------------------------------------------------ IMPRESSION AND PLAN:    Sheralyn Pinegar  is a 45 y.o. 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 Patient was found to be confused or staff. At present emergency room she appears  well oriented. There is no episode of confusion or psychosis noted. Workup in the ER showed hemoglobin of 5.1.  1. Acute on chronic anemia likely due to slow GI bleed versus anemia of chronic disease -Patient reports some stool on her wipes and depends on and off -She had an unremarkable upper GI and colonoscopy in August 2016 at [facility]. I'm not sure if small bowel capsule video endoscopy was done or not. -We'll admit to medical floor -Transfuse 1 unit of blood at dialysis -Consider GI consultation if patient has  significant rectal bleed. She does report of constipation and likely has underlying hemorrhoids -Continue stool preps  2. End-stage renal disease on hemodialysis -Nephrology consultation for dialysis -Spoke with Dr. Thedore Mins  3. Fibromyalgia, history of narcotic abuse, chronic pain syndrome Continue patient's home meds Patient goes to pain clinic in [facility]  4. History of seizures continue her anti-seizures  5. Bipolar/depression continue psych meds  6. Hyperlipidemia on statins  7. DVT prophylaxis SCD and teds  All the records are reviewed and case discussed with ED provider. Management plans discussed with the patient and they are in agreement.  CODE STATUS:Full   TOTAL TIME TAKING CARE OF THIS PATIENT:  45nutes.    Rahmir Beever M.D on 02/25/2016 at 2:21 PM  Between 7am to 6pm - Pager - [phone]  After 6pm go to [email or web address] - password [email or web address]  [facility]  Office  [phone]  CC: Primary care physician; Pcp Not In System

## 2016-02-25 NOTE — Progress Notes (Signed)

## 2016-02-25 NOTE — Progress Notes (Signed)
Subjective:  Patient known to our practice from outpatient dialysis Admitted for low hemoglobin and confusion. Patient presented to ER for right calf pain Oriented now Suspected GI bleed. Hgb 5.1   Objective:  Vital signs in last 24 hours:  Temp:  [97.8 F (36.6 C)-98.1 F (36.7 C)] 97.8 F (36.6 C) (04/27 1534) Pulse Rate:  [72-87] 83 (04/27 1534) Resp:  [13-18] 17 (04/27 1534) BP: (96-122)/(50-62) 122/50 mmHg (04/27 1534) SpO2:  [98 %-100 %] 99 % (04/27 1534) Weight:  [84.5 kg (186 lb 4.6 oz)] 84.5 kg (186 lb 4.6 oz) (04/27 1155)  Weight change:  Filed Weights   02/25/16 1155  Weight: 84.5 kg (186 lb 4.6 oz)    Intake/Output:   No intake or output data in the 24 hours ending 02/25/16 1743   Physical Exam: General: NAD, laying in bed  HEENT Anicteric, moist mucus membranes  Neck supple  Pulm/lungs Clear, room air, normal effort  CVS/Heart Regular, no rub, harsh systolic murmur  Abdomen:  Soft, non tender  Extremities: No peripheral edema  Neurologic: Alert, able to follow commands`  Skin: No acute rashes  Access: Left IJ permcath       Basic Metabolic Panel:   Recent Labs Lab 02/25/16 1218  NA 135  K 3.5  CL 99*  CO2 25  GLUCOSE 73  BUN 51*  CREATININE 4.73*  CALCIUM 7.8*     CBC:  Recent Labs Lab 02/25/16 1218  WBC 11.1*  HGB 5.1*  HCT 15.6*  MCV 91.7  PLT 232      Microbiology:  No results found for this or any previous visit (from the past 720 hour(s)).  Coagulation Studies: No results for input(s): LABPROT, INR in the last 72 hours.  Urinalysis: No results for input(s): COLORURINE, LABSPEC, PHURINE, GLUCOSEU, HGBUR, BILIRUBINUR, KETONESUR, PROTEINUR, UROBILINOGEN, NITRITE, LEUKOCYTESUR in the last 72 hours.  Invalid input(s): APPERANCEUR    Imaging: Dg Chest Portable 1 View  02/25/2016  CLINICAL DATA:  Chest pain, hemodialysis EXAM: PORTABLE CHEST 1 VIEW COMPARISON:  11/03/2015 FINDINGS: Cardiomegaly again noted. Dual  lumen left IJ catheter is unchanged in position. No acute infiltrate or pleural effusion. No pulmonary edema. IMPRESSION: No active disease. Electronically Signed   By: Natasha Mead M.D.   On: 02/25/2016 15:14     Medications:     . sodium chloride   Intravenous Once  . estradiol  1 Applicatorful Vaginal Once per day on Mon Thu  . fenofibrate  160 mg Oral Daily  . fludrocortisone  0.1 mg Oral Daily  . furosemide  40 mg Oral BID  . gabapentin  400 mg Oral QHS  . [START ON 02/26/2016] hydrocortisone  10 mg Oral q morning - 10a  . hydrocortisone  5 mg Oral QHS  . hydrOXYzine  25 mg Oral TID  . [START ON 02/26/2016] levothyroxine  112 mcg Oral QAC breakfast  . loratadine  10 mg Oral Daily  . [START ON 02/26/2016] methylphenidate  20 mg Oral Q lunch  . [START ON 02/26/2016] methylphenidate  30 mg Oral QAC breakfast  . mometasone-formoterol  2 puff Inhalation BID  . multivitamin with minerals  1 tablet Oral Daily  . oxyCODONE  10 mg Oral Q8H  . pravastatin  40 mg Oral q1800  . risperiDONE  2 mg Oral QHS  . [START ON 02/26/2016] risperidone  4 mg Oral q morning - 10a  . sertraline  50 mg Oral Daily  . sevelamer carbonate  4,000 mg Oral TID  WC  . topiramate  200 mg Oral BID  . topiramate  50 mg Oral BID  . Vitamin D (Ergocalciferol)  50,000 Units Oral Q30 days   acetaminophen **OR** acetaminophen, albuterol, cyclobenzaprine, guaiFENesin, guaifenesin, HYDROmorphone, ondansetron **OR** ondansetron (ZOFRAN) IV, ondansetron, polyethylene glycol  Assessment/ Plan:  45 y.o. female with ESRD, Fibromyalgia, psychosis, HLD, h/o stroke, DVT, depression, bipolar disorder, blindness  1. ESRD- Erin [name]/ TTS/ [facility] - Dialysis while in the hospital  2. AoCKD and blood loss - GI bleed suspected Work up in progress; blood transfusion to be given during HD - iv procrit with HD  3. SHPTH - sevelamer with meals     LOS: 0 Erin [name] 4/27/20175:43 PM

## 2016-02-25 NOTE — Progress Notes (Signed)
Pre-hd tx 

## 2016-02-25 NOTE — ED Notes (Signed)
Multiple attempts made by nursing staff to place a second IV, unsuccessful at this time.  Dr. Fanny Bien notified.

## 2016-02-25 NOTE — Progress Notes (Signed)
Hemodialysis completed. 

## 2016-02-25 NOTE — Progress Notes (Signed)
Hemodialysis start 

## 2016-02-25 NOTE — Progress Notes (Signed)
Packed red transfusion start

## 2016-02-25 NOTE — ED Notes (Signed)
XR notified that XR has not yet been completed at this time.

## 2016-02-25 NOTE — ED Notes (Signed)
Spoke with Tammy at [facility] regarding pt.  She states she has been having unusual behavior and trying to hurt herself by banging her head and chest against the table, yelling and screaming at all hours of the night.  She also stated that the patient tries to get off the dialysis machine early and says that she does not want to do dialysis anymore and just wants to die. Facility recommends patient evaluated by psychiatry.   Patient denies any SI/HI and at this is in no danger of harming herself.  This information was shared with Dr. Fanny Bien.

## 2016-02-25 NOTE — ED Notes (Signed)
Patient is resting at this time in bed with eyes closed.  Awaiting status on bed assignment.

## 2016-02-25 NOTE — ED Notes (Signed)
XR in room 

## 2016-02-26 DIAGNOSIS — F02818 Dementia in other diseases classified elsewhere, unspecified severity, with other behavioral disturbance: Secondary | ICD-10-CM

## 2016-02-26 DIAGNOSIS — F3162 Bipolar disorder, current episode mixed, moderate: Secondary | ICD-10-CM

## 2016-02-26 DIAGNOSIS — F0281 Dementia in other diseases classified elsewhere with behavioral disturbance: Secondary | ICD-10-CM

## 2016-02-26 DIAGNOSIS — F09 Unspecified mental disorder due to known physiological condition: Secondary | ICD-10-CM

## 2016-02-26 LAB — HEMOGLOBIN AND HEMATOCRIT, BLOOD
HCT: 18 % — ABNORMAL LOW (ref 35.0–47.0)
HEMATOCRIT: 25.8 % — AB (ref 35.0–47.0)
HEMATOCRIT: 26.3 % — AB (ref 35.0–47.0)
HEMOGLOBIN: 9.1 g/dL — AB (ref 12.0–16.0)
Hemoglobin: 6.1 g/dL — ABNORMAL LOW (ref 12.0–16.0)
Hemoglobin: 8.7 g/dL — ABNORMAL LOW (ref 12.0–16.0)

## 2016-02-26 LAB — CBC
HCT: 16.3 % — ABNORMAL LOW (ref 35.0–47.0)
HEMOGLOBIN: 5.6 g/dL — AB (ref 12.0–16.0)
MCH: 31.1 pg (ref 26.0–34.0)
MCHC: 34.2 g/dL (ref 32.0–36.0)
MCV: 90.8 fL (ref 80.0–100.0)
PLATELETS: 173 10*3/uL (ref 150–440)
RBC: 1.8 MIL/uL — AB (ref 3.80–5.20)
RDW: 16 % — AB (ref 11.5–14.5)
WBC: 6.2 10*3/uL (ref 3.6–11.0)

## 2016-02-26 LAB — PREPARE RBC (CROSSMATCH)

## 2016-02-26 LAB — GLUCOSE, CAPILLARY: GLUCOSE-CAPILLARY: 79 mg/dL (ref 65–99)

## 2016-02-26 LAB — MRSA PCR SCREENING: MRSA by PCR: NEGATIVE

## 2016-02-26 MED ORDER — SODIUM CHLORIDE 0.9 % IV SOLN
Freq: Once | INTRAVENOUS | Status: AC
  Administered 2016-02-26: 12:00:00 via INTRAVENOUS

## 2016-02-26 NOTE — Consult Note (Signed)
[facility] Face-to-Face Psychiatry Consult   Reason for Consult:  Consult for this 45 year old woman with multiple severe chronic medical problems as well as chronic major neurologic damage. Concern about recent agitated behaviors. 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 Bipolar 1 disorder, mixed, moderate ([place]) [F31.62] 02/26/2016  . 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 Anemia [D64.9] 02/25/2016    Total Time spent with patient: 1 hour  Subjective:   Erin [name] is a 45 y.o. female patient admitted with "I was acting out".  HPI:  Patient interviewed. Chart reviewed. Labs and vitals reviewed. 45 year old woman brought into the hospital with confusion and some behavior problems as well as other medical complaints with possible GI bleed. Patient is not a completely effective historian. She does tell me that she was acting out back at her living facility. She said that she was yelling and agitated. She can't give me a clear reason why. It is hard to pin her down on any descriptions of things. She often veers off into silence or starts repeating herself. She does tell me that she hates her living facility. She's been there about 6 months since her mother died in 08-Oct-2023. She says ever since then she has been unhappy and frustrated. Nevertheless she can't give any specific reason why she was having behavior problems recently. She denies that she was having any wish to harm herself or to harm anyone else. She denies that she was banging her head intentionally saying that she simply pumped her head by accident. Patient says that she does not stay constantly depressed. Denies suicidal ideation. Denies  homicidal ideation. Denies hallucinations or paranoia or delusions. She says despite being unhappy at her living facility that she does have friends and has some positive things that she enjoys in her life. She is not aware of any changes to her medicine. She did have some medical problems and has multiple severe chronic medical issues.  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 Has chronic headaches with severe chronic pain and is on narcotics and goes to a pain management clinic. Patient is on hemodialysis for end-stage renal disease. 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  Substance abuse history: She is on chronic narcotics and there has been concern expressed occasionally about narcotic misuse but it doesn't sound like she's ever been in any position where she could intentionally abused narcotics. She certainly is chronically dependent on them and has chronic pain behaviors around them but it doesn't seem like they are necessarily being abused.  Social history: Patient lived with her parents until 6 months ago when her mother passed away. He was chronically dependent because of her brain injuries. Sounds like she had a difficult relationship with her father. She mentions that one of her psychiatric hospitalizations in the past was for threatening to kill her father.  Past Psychiatric History: Sounds like she's had psychotic behaviors and agitated confused behavior is episodically throughout her life probably at least in part if not entirely due to her brain injury. She tells me she has had 2 previous psychiatric hospitalizations 1 for threatening to kill her self and one for threatening to kill her father but  both were years ago. She has never actually tried to kill her self. Recently has no suicidal or aggressive thoughts. She is being treated with chronic antipsychotics including Risperdal and also is on chronic  antidepressants Zoloft. Medicines appear to be chronic and stable and tolerated.  Risk to Self: Is patient at risk for suicide?: No Risk to Others:   Prior Inpatient Therapy:   Prior Outpatient Therapy:    Past Medical History:  Past Medical History  Diagnosis Date  . Fibromyalgia   . Chronic headaches     Seeing Pain Management  . Hyperlipidemia   . Psychosis   . Hypothyroid   . Narcolepsy   . Asthma   . Constipation   . Kidney failure     Currently on Dialysis  . Blind   . Epilepsy ([place])   . History of DVT (deep vein thrombosis)   . History of CVA (cerebrovascular accident)   . Depression   . Bipolar affect, depressed ([place])   . Anxiety     Past Surgical History  Procedure Laterality Date  . Brain surgery  1976  . Cholecystectomy  2001  . Breast lumpectomy  2001  . Pituitary excision  1976  . Dg av dialysis graft declot or      X 4  . Peripheral vascular catheterization N/A 08/21/2015    Procedure: Dialysis/Perma Catheter Insertion;  Surgeon: Katha Cabal, MD;  Location: [facility];  Service: Cardiovascular;  Laterality: N/A;   Family History: History reviewed. No pertinent family history. Family Psychiatric  History: She says both her mother and father suffered from depression Social History:  History  Alcohol Use No    Comment: occasionally     History  Drug Use No    Social History   Social History  . Marital Status: Single    Spouse Name: N/A  . Number of Children: N/A  . Years of Education: N/A   Social History Main Topics  . Smoking status: Current Every Day Smoker -- 0.50 packs/day for 16 years    Types: Cigarettes  . Smokeless tobacco: None  . Alcohol Use: No     Comment: occasionally  . Drug Use: No  . Sexual Activity: Yes   Other Topics Concern  . None   Social History Narrative   Additional Social History:    Allergies:   Allergies  Allergen Reactions  . Dhea [Nutritional Supplements] Anaphylaxis    Patient  states medication is DHE for migraines, not DHEA  . Demerol [Meperidine] Other (See Comments)    Reaction:  Hallucinations   . Floxin [Ofloxacin] Other (See Comments)    Reaction:  Hallucinations   . Nsaids Nausea And Vomiting and Swelling  . Nubain [Nalbuphine Hcl] Other (See Comments)    Reaction:  Hallucinations   . Phenergan [Promethazine Hcl] Other (See Comments)    Reaction:  Restless legs   . Stadol [Butorphanol] Other (See Comments)    Reaction:  Hallucinations   . Erythromycin Diarrhea and Rash    Labs:  Results for orders placed or performed during the hospital encounter of 02/25/16 (from the past 48 hour(s))  Basic metabolic panel     Status: Abnormal   Collection Time: 02/25/16 12:18 PM  Result Value Ref Range   Sodium 135 135 - 145 mmol/L   Potassium 3.5 3.5 - 5.1 mmol/L   Chloride 99 (L) 101 - 111 mmol/L   CO2 25 22 - 32 mmol/L   Glucose, Bld 73 65 -  99 mg/dL   BUN 51 (H) 6 - 20 mg/dL   Creatinine, Ser 4.73 (H) 0.44 - 1.00 mg/dL   Calcium 7.8 (L) 8.9 - 10.3 mg/dL   GFR calc non Af Amer 10 (L) >60 mL/min   GFR calc Af Amer 12 (L) >60 mL/min    Comment: (NOTE) The eGFR has been calculated using the CKD EPI equation. This calculation has not been validated in all clinical situations. eGFR's persistently <60 mL/min signify possible Chronic Kidney Disease.    Anion gap 11 5 - 15  CBC     Status: Abnormal   Collection Time: 02/25/16 12:18 PM  Result Value Ref Range   WBC 11.1 (H) 3.6 - 11.0 K/uL   RBC 1.70 (L) 3.80 - 5.20 MIL/uL   Hemoglobin 5.1 (L) 12.0 - 16.0 g/dL   HCT 15.6 (L) 35.0 - 47.0 %   MCV 91.7 80.0 - 100.0 fL   MCH 30.2 26.0 - 34.0 pg   MCHC 32.9 32.0 - 36.0 g/dL   RDW 16.7 (H) 11.5 - 14.5 %   Platelets 232 150 - 440 K/uL  Troponin I     Status: Abnormal   Collection Time: 02/25/16 12:18 PM  Result Value Ref Range   Troponin I 0.05 (H) <0.031 ng/mL    Comment: READ BACK AND VERIFIED WITH ASHLEY ROSS AT 9629 ON 02/25/16 BY [facility]         PERSISTENTLY INCREASED TROPONIN VALUES IN THE RANGE OF 0.04-0.49 ng/mL CAN BE SEEN IN:       -UNSTABLE ANGINA       -CONGESTIVE HEART FAILURE       -MYOCARDITIS       -CHEST TRAUMA       -ARRYHTHMIAS       -LATE PRESENTING MYOCARDIAL INFARCTION       -COPD   CLINICAL FOLLOW-UP RECOMMENDED.   Ammonia     Status: None   Collection Time: 02/25/16 12:18 PM  Result Value Ref Range   Ammonia 24 9 - 35 umol/L  Type and screen [place]     Status: None (Preliminary result)   Collection Time: 02/25/16  1:40 PM  Result Value Ref Range   ABO/RH(D) A POS    Antibody Screen NEG    Sample Expiration 02/28/2016    Unit Number [id number]    Blood Component Type RED CELLS,LR    Unit division 00    Status of Unit ISSUED,FINAL    Transfusion Status OK TO TRANSFUSE    Crossmatch Result Compatible    Unit Number [id number]    Blood Component Type RED CELLS,LR    Unit division 00    Status of Unit ISSUED    Transfusion Status OK TO TRANSFUSE    Crossmatch Result Compatible    Unit Number [id number]    Blood Component Type RBC LR PHER2    Unit division 00    Status of Unit ISSUED    Transfusion Status OK TO TRANSFUSE    Crossmatch Result Compatible   ABO/Rh     Status: None   Collection Time: 02/25/16  1:41 PM  Result Value Ref Range   ABO/RH(D) A POS   Prepare RBC     Status: None   Collection Time: 02/25/16  4:00 PM  Result Value Ref Range   Order Confirmation ORDER PROCESSED BY BLOOD BANK   CBC     Status: Abnormal   Collection Time: 02/26/16  5:27 AM  Result Value Ref Range  WBC 6.2 3.6 - 11.0 K/uL   RBC 1.80 (L) 3.80 - 5.20 MIL/uL   Hemoglobin 5.6 (L) 12.0 - 16.0 g/dL   HCT 16.3 (L) 35.0 - 47.0 %   MCV 90.8 80.0 - 100.0 fL   MCH 31.1 26.0 - 34.0 pg   MCHC 34.2 32.0 - 36.0 g/dL   RDW 16.0 (H) 11.5 - 14.5 %   Platelets 173 150 - 440 K/uL  MRSA PCR Screening     Status: None   Collection Time: 02/26/16  6:02 AM  Result Value Ref Range    MRSA by PCR NEGATIVE NEGATIVE    Comment:        The GeneXpert MRSA Assay (FDA approved for NASAL specimens only), is one component of a comprehensive MRSA colonization surveillance program. It is not intended to diagnose MRSA infection nor to guide or monitor treatment for MRSA infections.   Hemoglobin and hematocrit, blood     Status: Abnormal   Collection Time: 02/26/16 12:03 PM  Result Value Ref Range   Hemoglobin 6.1 (L) 12.0 - 16.0 g/dL   HCT 18.0 (L) 35.0 - 47.0 %  Prepare RBC     Status: None   Collection Time: 02/26/16 12:30 PM  Result Value Ref Range   Order Confirmation ORDER PROCESSED BY BLOOD BANK   Hemoglobin and hematocrit, blood     Status: Abnormal   Collection Time: 02/26/16  6:17 PM  Result Value Ref Range   Hemoglobin 9.1 (L) 12.0 - 16.0 g/dL   HCT 26.3 (L) 35.0 - 47.0 %    Current Facility-Administered Medications  Medication Dose Route Frequency Provider Last Rate Last Dose  . 0.9 %  sodium chloride infusion   Intravenous Once Fritzi Mandes, MD      . acetaminophen (TYLENOL) tablet 650 mg  650 mg Oral Q6H PRN Fritzi Mandes, MD       Or  . acetaminophen (TYLENOL) suppository 650 mg  650 mg Rectal Q6H PRN Fritzi Mandes, MD      . albuterol (PROVENTIL) (2.5 MG/3ML) 0.083% nebulizer solution 2.5 mg  2.5 mg Nebulization Q6H PRN Fritzi Mandes, MD      . cyclobenzaprine (FLEXERIL) tablet 10 mg  10 mg Oral TID PRN Fritzi Mandes, MD      . Derrill Memo ON 02/27/2016] epoetin alfa (EPOGEN,PROCRIT) injection 10,000 Units  10,000 Units Intravenous Q T,Th,Sa-HD Murlean Iba, MD      . estradiol (ESTRACE) vaginal cream 1 Applicatorful  1 Applicatorful Vaginal Once per day on Mon Thu Fritzi Mandes, MD      . fenofibrate tablet 160 mg  160 mg Oral Daily Fritzi Mandes, MD   160 mg at 02/26/16 1419  . fludrocortisone (FLORINEF) tablet 0.1 mg  0.1 mg Oral Daily Fritzi Mandes, MD   0.1 mg at 02/26/16 1420  . furosemide (LASIX) tablet 40 mg  40 mg Oral BID Fritzi Mandes, MD   40 mg at 02/26/16 1809  .  gabapentin (NEURONTIN) capsule 400 mg  400 mg Oral QHS Fritzi Mandes, MD   400 mg at 02/26/16 2133  . guaiFENesin (MUCINEX) 12 hr tablet 600 mg  600 mg Oral BID PRN Fritzi Mandes, MD      . guaifenesin (ROBITUSSIN) 100 MG/5ML syrup 200 mg  200 mg Oral Q6H PRN Fritzi Mandes, MD      . hydrocortisone (CORTEF) tablet 10 mg  10 mg Oral q morning - 10a Fritzi Mandes, MD   10 mg at 02/26/16 1420  . hydrocortisone (CORTEF)  tablet 5 mg  5 mg Oral QHS Fritzi Mandes, MD   5 mg at 02/26/16 2132  . HYDROmorphone (DILAUDID) tablet 6 mg  6 mg Oral Q6H PRN Fritzi Mandes, MD   6 mg at 02/26/16 1809  . hydrOXYzine (ATARAX/VISTARIL) tablet 25 mg  25 mg Oral TID Fritzi Mandes, MD   25 mg at 02/26/16 1418  . levothyroxine (SYNTHROID, LEVOTHROID) tablet 112 mcg  112 mcg Oral QAC breakfast Fritzi Mandes, MD   112 mcg at 02/26/16 0800  . loratadine (CLARITIN) tablet 10 mg  10 mg Oral Daily Fritzi Mandes, MD   10 mg at 02/26/16 1419  . methylphenidate (RITALIN) tablet 20 mg  20 mg Oral Q lunch Fritzi Mandes, MD   20 mg at 02/26/16 1416  . methylphenidate (RITALIN) tablet 30 mg  30 mg Oral QAC breakfast Fritzi Mandes, MD   30 mg at 02/26/16 0800  . mometasone-formoterol (DULERA) 100-5 MCG/ACT inhaler 2 puff  2 puff Inhalation BID Fritzi Mandes, MD   2 puff at 02/26/16 2006  . multivitamin with minerals tablet 1 tablet  1 tablet Oral Daily Fritzi Mandes, MD      . ondansetron (ZOFRAN) tablet 4 mg  4 mg Oral Q6H PRN Fritzi Mandes, MD       Or  . ondansetron (ZOFRAN) injection 4 mg  4 mg Intravenous Q6H PRN Fritzi Mandes, MD      . ondansetron (ZOFRAN-ODT) disintegrating tablet 8 mg  8 mg Oral Q8H PRN Fritzi Mandes, MD      . oxyCODONE (Oxy IR/ROXICODONE) immediate release tablet 10 mg  10 mg Oral Q8H Fritzi Mandes, MD   10 mg at 02/26/16 2133  . polyethylene glycol (MIRALAX / GLYCOLAX) packet 17 g  17 g Oral Daily PRN Fritzi Mandes, MD      . pravastatin (PRAVACHOL) tablet 40 mg  40 mg Oral q1800 Fritzi Mandes, MD   40 mg at 02/26/16 1808  . risperiDONE (RISPERDAL) tablet 2 mg   2 mg Oral QHS Fritzi Mandes, MD   2 mg at 02/26/16 2130  . risperiDONE (RISPERDAL) tablet 4 mg  4 mg Oral q morning - 10a Fritzi Mandes, MD   4 mg at 02/26/16 1419  . sertraline (ZOLOFT) tablet 50 mg  50 mg Oral Daily Fritzi Mandes, MD   50 mg at 02/26/16 1418  . sevelamer carbonate (RENVELA) tablet 4,000 mg  4,000 mg Oral TID WC Fritzi Mandes, MD   4,000 mg at 02/26/16 1808  . topiramate (TOPAMAX) tablet 200 mg  200 mg Oral BID Fritzi Mandes, MD   200 mg at 02/26/16 2131  . topiramate (TOPAMAX) tablet 50 mg  50 mg Oral BID Fritzi Mandes, MD   50 mg at 02/26/16 2133  . Vitamin D (Ergocalciferol) (DRISDOL) capsule 50,000 Units  50,000 Units Oral Q30 days Fritzi Mandes, MD        Musculoskeletal: Strength & Muscle Tone: decreased Gait & Station: unable to stand Patient leans: N/A  Psychiatric Specialty Exam: Review of Systems  Constitutional: Negative.   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  Respiratory: Negative.   Cardiovascular: Negative.   Gastrointestinal: Negative.   Musculoskeletal: Negative.   Skin: Negative.   Neurological: Positive for headaches.  Psychiatric/Behavioral: Positive for memory loss. Negative for depression, suicidal ideas, hallucinations and substance abuse. The patient is nervous/anxious. The patient does not have insomnia.     Blood pressure 145/63, pulse 79, temperature 98.9 F (37.2  C), temperature source Oral, resp. rate 20, height 4' 10"  (1.473 m), weight 84.5 kg (186 lb 4.6 oz), last menstrual period 11/21/2007, SpO2 100 %.Body mass index is 38.94 kg/(m^2).  General Appearance: Casual  Eye Contact::  None  Speech:  Garbled and Slow  Volume:  Decreased  Mood:  Euthymic  Affect:  Constricted and Odd and inappropriate at times  Thought Process:  Loose and Tangential  Orientation:  Full (Time, Place, and Person)  Thought Content:  Negative  Suicidal Thoughts:  No  Homicidal Thoughts:  No  Memory:  Immediate;   Good Recent;   Fair Remote;   Fair   Judgement:  Fair  Insight:  Fair  Psychomotor Activity:  Decreased  Concentration:  Fair  Recall:  [employer] of Knowledge:Fair  Language: Fair  Akathisia:  No  Handed:  Right  AIMS (if indicated):     Assets:  Financial Resources/Insurance Housing Resilience  ADL's:  Impaired  Cognition: Impaired,  Mild  Sleep:      Treatment Plan Summary: Plan 45 year old woman with chronic brain injury causing what are probably difficult to categorize symptoms. In this kind of situation I would not be surprised by their still being episodic behavior problems. Currently to my exam she is not psychotic, not showing any signs of mania, not depressed, not acting out in a dangerous manner. Presumably what ever behavior she was having was transient and probably related to some stress either chronic or acute. I don't see any reason to change her chronic psychiatric medicine. I will follow up with her while she is in the hospital.  Disposition: Patient does not meet criteria for psychiatric inpatient admission. Supportive therapy provided about ongoing stressors.  Alethia Berthold, MD 02/26/2016 9:54 PM

## 2016-02-26 NOTE — Progress Notes (Signed)
Post dialysis assessment 

## 2016-02-26 NOTE — Progress Notes (Signed)
Asked to consult for Permcath removal. Patient refusing to sign consent stating removal of the Permcath at the bedside "is not sanitary". Patient assured procedure would be completed under sterile conditions. Patient still refusing. At this point, if patient would like her Permcath removed by our service we would be happy to schedule her for an outpatient removal (as we do all our permcath removals) at her convenience. Please re-consult as needed. Witnessed by me and Feliz Beam from specials.

## 2016-02-26 NOTE — Care Management Note (Addendum)
Patient is active at [facility] on TTS Schedule on 2nd shift appox.11:00 am I have sent admission records to clinic and will follow up with addtioinal records at discharge.  Tywaun Hiltner Dialysis Coordinator [phone] 

## 2016-02-26 NOTE — Progress Notes (Signed)
[facility] [place] at [facility]   PATIENT NAME: Erin [name]    MR#:  [id number]  DATE OF BIRTH:  1971/08/08  SUBJECTIVE:  CHIEF COMPLAINT:  Patient is resting comfortably. Denies any chest pain or shortness of breath. Denies any rectal bleed and hematemesis. Denies any abdominal pain. Rectal bleed was noticed in the ED  REVIEW OF SYSTEMS:  CONSTITUTIONAL: No fever, fatigue or weakness.  EYES: No blurred or double vision.  EARS, NOSE, AND THROAT: No tinnitus or ear pain.  RESPIRATORY: No cough, shortness of breath, wheezing or hemoptysis.  CARDIOVASCULAR: No chest pain, orthopnea, edema.  GASTROINTESTINAL: No nausea, vomiting, diarrhea or abdominal pain.  GENITOURINARY: No dysuria, hematuria.  ENDOCRINE: No polyuria, nocturia,  HEMATOLOGY: No anemia, easy bruising or bleeding SKIN: No rash or lesion. MUSCULOSKELETAL: No joint pain or arthritis.   NEUROLOGIC: No tingling, numbness, weakness.  PSYCHIATRY: No anxiety or depression.   DRUG ALLERGIES:   Allergies  Allergen Reactions  . Dhea [Nutritional Supplements] Anaphylaxis    Patient states medication is DHE for migraines, not DHEA  . Demerol [Meperidine] Other (See Comments)    Reaction:  Hallucinations   . Floxin [Ofloxacin] Other (See Comments)    Reaction:  Hallucinations   . Nsaids Nausea And Vomiting and Swelling  . Nubain [Nalbuphine Hcl] Other (See Comments)    Reaction:  Hallucinations   . Phenergan [Promethazine Hcl] Other (See Comments)    Reaction:  Restless legs   . Stadol [Butorphanol] Other (See Comments)    Reaction:  Hallucinations   . Erythromycin Diarrhea and Rash    VITALS:  Blood pressure 166/81, pulse 86, temperature 98 F (36.7 C), temperature source Oral, resp. rate 20, height  (1.473 m), weight 84.5 kg (186 lb 4.6 oz), last menstrual period 11/21/2007, SpO2 100 %.  PHYSICAL EXAMINATION:  GENERAL:  45 y.o.-year-old patient lying in the bed with no acute  distress.  EYES: Pupils equal, round, reactive to light and accommodation. No scleral icterus. Extraocular muscles intact.  HEENT: Head atraumatic, normocephalic. Oropharynx and nasopharynx clear.  NECK:  Supple, no jugular venous distention. No thyroid enlargement, no tenderness.  LUNGS: Normal breath sounds bilaterally, no wheezing, rales,rhonchi or crepitation. No use of accessory muscles of respiration.  CARDIOVASCULAR: S1, S2 normal. No murmurs, rubs, or gallops.  ABDOMEN: Soft, nontender, nondistended. Bowel sounds present. No organomegaly or mass.  EXTREMITIES: No pedal edema, cyanosis, or clubbing.  NEUROLOGIC: Cranial nerves II through XII are intact. Muscle strength 5/5 in all extremities. Sensation intact. Gait not checked.  PSYCHIATRIC: The patient is alert and oriented x 3.  SKIN: No obvious rash, lesion, or ulcer.    LABORATORY PANEL:   CBC  Recent Labs Lab 02/26/16 0527 02/26/16 1203  WBC 6.2  --   HGB 5.6* 6.1*  HCT 16.3* 18.0*  PLT 173  --    ------------------------------------------------------------------------------------------------------------------  Chemistries   Recent Labs Lab 02/25/16 1218  NA 135  K 3.5  CL 99*  CO2 25  GLUCOSE 73  BUN 51*  CREATININE 4.73*  CALCIUM 7.8*   ------------------------------------------------------------------------------------------------------------------  Cardiac Enzymes  Recent Labs Lab 02/25/16 1218  TROPONINI 0.05*   ------------------------------------------------------------------------------------------------------------------  RADIOLOGY:  Dg Chest Portable 1 View  02/25/2016  CLINICAL DATA:  Chest pain, hemodialysis EXAM: PORTABLE CHEST 1 VIEW COMPARISON:  11/03/2015 FINDINGS: Cardiomegaly again noted. Dual lumen left IJ catheter is unchanged in position. No acute infiltrate or pleural effusion. No pulmonary edema. IMPRESSION: No active disease. Electronically Signed  By: Natasha Mead M.D.   On:  02/25/2016 15:14    EKG:   Orders placed or performed during the hospital encounter of 02/25/16  . ED EKG within 10 minutes  . ED EKG within 10 minutes    ASSESSMENT AND PLAN:    Erin [name] is a 45 y.o. 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 Patient was found to be confused or staff. At present emergency room she appears well oriented. There is no episode of confusion or psychosis noted. Workup in the ER showed hemoglobin of 5.1.  1. Acute on chronic anemia likely due to slow GI bleed versus anemia of chronic disease -Patient reports some stool on her wipes and depends on and off -Hemoglobin at 5.6 today status post 1 unit PRBC, will type and cross match and transfuse 2 units of blood during hemodialysis. Also patient will get Procrit during dialysis -Check stool for Hemoccult as patient is denying any active bleed today -Follow up with gastroenterology -Monitor hemoglobin and hematocrit every 6 hours and check CBC in a.m. -She had an unremarkable upper GI and colonoscopy in August 2016 at [facility]. I'm not sure if small bowel capsule video endoscopy was done or not.  2. End-stage renal disease on hemodialysis -Nephrology consultation for dialysis -Spoke with Dr. Thedore Mins. Appreciate nephrology recommendations  3. Fibromyalgia, history of narcotic abuse, chronic pain syndrome Continue patient's home meds Patient goes to pain clinic in [facility]  4. History of seizures continue her anti-seizures  5. Bipolar/depression continue psych meds  6. Hyperlipidemia on statins  7. DVT prophylaxis SCD and teds    All the records are reviewed and case discussed with Care  Management/Social Workerr. Management plans discussed with the patient, she is  in agreement.  CODE STATUS: fc TOTAL TIME TAKING CARE OF THIS PATIENT: 35 minutes.   POSSIBLE D/C IN 2-3 DAYS, DEPENDING ON CLINICAL CONDITION.   Ramonita Lab M.D on 02/26/2016 at 3:25 PM  Between 7am to 6pm - Pager - [phone] After 6pm go to [email or web address] - password [email or web address]  [facility]  Office  [phone]  CC: Primary care physician; Pcp Not In System

## 2016-02-26 NOTE — Progress Notes (Signed)
Pre dialysis assessment 

## 2016-02-26 NOTE — Progress Notes (Signed)
Subjective:  Patient known to our practice from outpatient dialysis Admitted for low hemoglobin and confusion. Patient presented to ER for right calf pain Oriented now Suspected GI bleed. Hgb 5.1. Given 1 unit transfusion.  Hemoglobin only up to 6.1. Patient seen during dialysis Tolerating well     HEMODIALYSIS FLOWSHEET:  Blood Flow Rate (mL/min): 400 mL/min Arterial Pressure (mmHg): -140 mmHg Venous Pressure (mmHg): 150 mmHg Transmembrane Pressure (mmHg): 40 mmHg Ultrafiltration Rate (mL/min): 170 mL/min Dialysate Flow Rate (mL/min): 800 ml/min Conductivity: Machine : 13.5 (Online Clearance Test) Conductivity: Machine : 13.5 (Online Clearance Test) Dialysis Fluid Bolus: Normal Saline Bolus Amount (mL): 250 mL Intra-Hemodialysis Comments: UF removed . Patient reports back pain, request primary RN be notified for PRN medication.     Objective:  Vital signs in last 24 hours:  Temp:  [97.7 F (36.5 C)-99.4 F (37.4 C)] 98.9 F (37.2 C) (04/28 1100) Pulse Rate:  [79-96] 93 (04/28 1300) Resp:  [11-29] 18 (04/28 1300) BP: (79-157)/(43-79) 157/70 mmHg (04/28 1300) SpO2:  [97 %-100 %] 100 % (04/28 1300) Weight:  [84.5 kg (186 lb 4.6 oz)] 84.5 kg (186 lb 4.6 oz) (04/27 1755)  Weight change:  Filed Weights   02/25/16 1155 02/25/16 1755  Weight: 84.5 kg (186 lb 4.6 oz) 84.5 kg (186 lb 4.6 oz)    Intake/Output:    Intake/Output Summary (Last 24 hours) at 02/26/16 1349 Last data filed at 02/26/16 1200  Gross per 24 hour  Intake    620 ml  Output   1001 ml  Net   -381 ml     Physical Exam: General: NAD, laying in bed  HEENT Anicteric, moist mucus membranes  Neck supple  Pulm/lungs Clear, room air, normal effort  CVS/Heart Regular, no rub, harsh systolic murmur  Abdomen:  Soft, non tender  Extremities: No peripheral edema  Neurologic: Alert, able to follow commands`  Skin: No acute rashes  Access: Left IJ permcath, Right arm AV graft       Basic  Metabolic Panel:   Recent Labs Lab 02/25/16 1218  NA 135  K 3.5  CL 99*  CO2 25  GLUCOSE 73  BUN 51*  CREATININE 4.73*  CALCIUM 7.8*     CBC:  Recent Labs Lab 02/25/16 1218 02/26/16 0527 02/26/16 1203  WBC 11.1* 6.2  --   HGB 5.1* 5.6* 6.1*  HCT 15.6* 16.3* 18.0*  MCV 91.7 90.8  --   PLT 232 173  --       Microbiology:  Recent Results (from the past 720 hour(s))  MRSA PCR Screening     Status: None   Collection Time: 02/26/16  6:02 AM  Result Value Ref Range Status   MRSA by PCR NEGATIVE NEGATIVE Final    Comment:        The GeneXpert MRSA Assay (FDA approved for NASAL specimens only), is one component of a comprehensive MRSA colonization surveillance program. It is not intended to diagnose MRSA infection nor to guide or monitor treatment for MRSA infections.     Coagulation Studies: No results for input(s): LABPROT, INR in the last 72 hours.  Urinalysis: No results for input(s): COLORURINE, LABSPEC, PHURINE, GLUCOSEU, HGBUR, BILIRUBINUR, KETONESUR, PROTEINUR, UROBILINOGEN, NITRITE, LEUKOCYTESUR in the last 72 hours.  Invalid input(s): APPERANCEUR    Imaging: Dg Chest Portable 1 View  02/25/2016  CLINICAL DATA:  Chest pain, hemodialysis EXAM: PORTABLE CHEST 1 VIEW COMPARISON:  11/03/2015 FINDINGS: Cardiomegaly again noted. Dual lumen left IJ catheter is unchanged in position.  No acute infiltrate or pleural effusion. No pulmonary edema. IMPRESSION: No active disease. Electronically Signed   By: Natasha Mead M.D.   On: 02/25/2016 15:14     Medications:     . sodium chloride   Intravenous Once  . sodium chloride   Intravenous Once  . [START ON 02/27/2016] epoetin (EPOGEN/PROCRIT) injection  10,000 Units Intravenous Q T,Th,Sa-HD  . estradiol  1 Applicatorful Vaginal Once per day on Mon Thu  . fenofibrate  160 mg Oral Daily  . fludrocortisone  0.1 mg Oral Daily  . furosemide  40 mg Oral BID  . gabapentin  400 mg Oral QHS  . hydrocortisone  10 mg  Oral q morning - 10a  . hydrocortisone  5 mg Oral QHS  . hydrOXYzine  25 mg Oral TID  . levothyroxine  112 mcg Oral QAC breakfast  . loratadine  10 mg Oral Daily  . methylphenidate  20 mg Oral Q lunch  . methylphenidate  30 mg Oral QAC breakfast  . mometasone-formoterol  2 puff Inhalation BID  . multivitamin with minerals  1 tablet Oral Daily  . oxyCODONE  10 mg Oral Q8H  . pravastatin  40 mg Oral q1800  . risperiDONE  2 mg Oral QHS  . risperidone  4 mg Oral q morning - 10a  . sertraline  50 mg Oral Daily  . sevelamer carbonate  4,000 mg Oral TID WC  . topiramate  200 mg Oral BID  . topiramate  50 mg Oral BID  . Vitamin D (Ergocalciferol)  50,000 Units Oral Q30 days   acetaminophen **OR** acetaminophen, albuterol, cyclobenzaprine, guaiFENesin, guaifenesin, HYDROmorphone, ondansetron **OR** ondansetron (ZOFRAN) IV, ondansetron, polyethylene glycol  Assessment/ Plan:  45 y.o. female with ESRD, Fibromyalgia, psychosis, HLD, h/o stroke, DVT, depression, bipolar disorder, blindness  1. ESRD- [facility]/ MWF-2/ [facility] - Dialysis while in the hospital - Patient seen during dialysis Tolerating well  Now that AV graft is working, we will request vascular to remove the PermCath  2. AoCKD and blood loss - GI bleed suspected Work up in progress; blood transfusion given 4/27 - iv procrit with HD - Blood transfusion planned for today - Procrit today  3. SHPTH - sevelamer with meals     LOS: 1 Garcia Dalzell 4/28/20171:49 PM

## 2016-02-26 NOTE — Clinical Social Work Note (Signed)
Clinical Social Work Assessment  Patient Details  Name: Erin [name] MRN: [id number] Date of Birth: 1970-11-23  Date of referral:  02/26/16               Reason for consult:  Facility Placement                Permission sought to share information with:    Permission granted to share information::     Name::        Agency::     Relationship::     Contact Information:     Housing/Transportation Living arrangements for the past 2 months:  Assisted Living Facility Source of Information:  Facility Patient Interpreter Needed:  None Criminal Activity/Legal Involvement Pertinent to Current Situation/Hospitalization:  No - Comment as needed Significant Relationships:    Lives with:  Facility Resident Do you feel safe going back to the place where you live?  Yes Need for family participation in patient care:  No (Coment)  Care giving concerns:  Patent resides at [facility].   Social Worker assessment / plan:  Patient was in her room this morning yelling out for more pain medication and then went down to dialysis unit, where she has been most of the afternoon. CSW spoke with Babette Relic and Liborio Nixon at [facility] regarding patient. Tammy stated that patient came to them as a DSS emergency placement in January of this year because patient was being cared for by her mother and her mother past away and patient was unable to care for herself. Patient was placed at [facility]. Liborio Nixon at [facility] stated that patient has always been easily agitated if she does not get her way and tends to yell out for attention. Liborio Nixon stated that patient is a drug seeker and became upset this week when she had an MD appointment and her oxycontin was decreased. On Wednesday of this week, patient began hitting her head on various objects and banging her chest into objects (which resulted in the bruising patient has on her body). Liborio Nixon stated patient also began going into residences' rooms and yelling at them.  Initially Tammy at [facility] stated that they would not be able to take patient back and that they had issued a 30 day notice but it was only verbal. CSW explained to Tammy that the 30 day notice has to be in writing otherwise there is no proof that they issued it and thus patient would be allowed to return to their facility. Tammy stated that patient could return this time but that they wanted a psych consult for patient. CSW has requested a psych consult for patient to be evaluated for new onset of negative behaviors.   Employment status:  Disabled (Comment on whether or not currently receiving Disability) Insurance information:  Medicare PT Recommendations:  Not assessed at this time Information / Referral to community resources:     Patient/Family's Response to care:  Facility expressed appreciation for CSW assistance.  Patient/Family's Understanding of and Emotional Response to Diagnosis, Current Treatment, and Prognosis:  CSW unable to speak with patient as of yet due to her being off the unit most of the day.   Emotional Assessment Appearance:  Appears stated age Attitude/Demeanor/Rapport:   (attention seeking) Affect (typically observed):  Unable to Assess Orientation:  Oriented to Self Alcohol / Substance use:  Not Applicable Psych involvement (Current and /or in the community):  No (Comment)  Discharge Needs  Concerns to be addressed:  Care Coordination Readmission within the  last 30 days:  No Current discharge risk:  None Barriers to Discharge:  No Barriers Identified   York Spaniel, LCSW 02/26/2016, 2:22 PM

## 2016-02-26 NOTE — Progress Notes (Signed)
GI Note:  I recommend capsule endoscopy for the recurrent severe IDA since she had colon and egd in past year. She refuses to drink any prep for the capsule and also refuses to stay on clear liquids so will cancel the plan for capsule endo.

## 2016-02-26 NOTE — Consult Note (Signed)
GI Inpatient Consult Note  Reason for Consult: GI Bleed   Attending Requesting Consult: Dr. Amado Coe  History of Present Illness: Erin [name] is a 45 y.o. female  With a significant past medical history of previous GI bleeds, end-stage renal disease on dialysis Tuesdays, Thursdays, Saturday, blindness, was found to have a hemoglobin of 5.6 in the emergency department and had stool in her diaper that was positive for blood.  She is a poor historian, some information was taken from her, but majority from medical records.  She reports that the last time she went dialysis they told her that she was acting weird, she denies feeling any different, denies abdominal pain, is unsure if she was having loose bowel movements or if they were formed.  She reports that 1 of her helpers told her that they saw blood when they cleaned her.  She resides at [facility] assisted living.  Her medical records show that she was hospitalized at [facility] in August 2016 and developed a GI bleed during the hospitalization.  She had an upper endoscopy and colonoscopy during that stay.  Copies of those records:   Developed new melanotic stools 8/10-8/11 overnight with multiple large maroon bowel movements. She was transferring back from the commode with an aide and felt lightheaded and "as if the world was closing in on her" and had to be helped to the ground. A code blue was called as she was reportedly unresponsive for a short period of time but maintained her saturations and pulse throughout the episode. She was more hypotensive than baseline in the 80-90s/30-40s and was transferred to the step-down unit. A central line was attempted without success and eventually her LLE graft was accessed by the dialysis nurse for resuscitation. She was hemodynamically responsive to crystalloids (1 L) and 1 uPRBC.  She was intubated for unstable mental status and emergent EGD, which revealed no source of bleeding. A femoral central line  was placed. The next day, she was extubated with no continuing oxygen requirement, and then subsequently underwent colonoscopy which also revealed no source of bleeding. Bleeding most likely from the small bowel as described above. No further workup indicated at this time as her hemoglobin remained stable around ~8 with no She received hemodialysis on 8/12. She was stable, alert, with no O2 requirement, and deemed stable for discharge following completion of her hemodialysis session on 8/15.   Findings: LA Grade B (one or more mucosal breaks greater than 5 mm, not extending  between the tops of two mucosal folds) esophagitis with no bleeding was  found at the gastroesophageal junction. The entire examined stomach was normal. There is no endoscopic evidence of bleeding in the stomach. Thick dark bilious material was found in the duodenal however the  examined duodenum was normal. There is no endoscopic evidence of bleeding in the entire examined  duodenum.  Impression: - LA Grade B reflux esophagitis. - Normal stomach. - Normal examined duodenum. - No endoscopic explanation for melena in the upper GI  tract. - No specimens collected. Recommendation: - Continue care in MICU - Perform a colonoscopy tomorrow. - Prep with 4L golytely starting now, consider placement  of NGT while patient remains intubated for adminstration  of bowel prep - Patient does not need to remain intubated from a GI  perspective at this point. - Clear liquids only prior to colonoscopy, must be NPO for  2 hours prior to procedure - Trend H/H and transfuse as needed to maintain Hb>7 - Further  recommendations may be made by inpatient GI team. - Put patient on a clear liquid diet starting today.    Findings: The perianal and digital rectal examinations were normal. Hematin (altered blood/coffee-ground-like material) was found in the  entire colon. There is no endoscopic evidence of fresh bleeding, mass or  ulcerations  in the entire colon. The exam was otherwise without abnormality on direct and retroflexion  views.  Impression: - hematin (old blood) in the entire examined colon. - The examination was otherwise normal on direct and  retroflexion views. - No specimens collected. - No evidence of active bleeding. Recommendation: - Further recommendations can be made by the GI consult  team. Please contact them if necessary. - Return patient to hospital ward for ongoing care. - Resume previous diet. - Continue PPI BID - If pt shows signs of brisk, active bleeding would obtain  nuclear medicine bleeding scan as prior bleeding site has  been likely localized to small bowel. - No repeat colonoscopy.  Patient reports that she did have a small bowel study completed at [facility] after that hospitalization, however, unable to locate this record.  Patient reports she gets occasional acid reflux and heartburn, does not take anything for it.  She denies any type of NSAID use.  Past Medical History:  Past Medical History  Diagnosis Date  . Fibromyalgia   . Chronic headaches     Seeing Pain Management  . Hyperlipidemia   . Psychosis   . Hypothyroid   . Narcolepsy   . Asthma   . Constipation   . Kidney failure     Currently on Dialysis  . Blind   . Epilepsy (HCC)   . History of DVT (deep vein thrombosis)   . History of CVA (cerebrovascular accident)   . Depression   . Bipolar affect, depressed (HCC)   . Anxiety     Problem List: Patient Active Problem List   Diagnosis Date Noted  . Anemia 02/25/2016    Past Surgical History: Past Surgical History  Procedure Laterality Date  . Brain surgery  1976  . Cholecystectomy  2001  . Breast lumpectomy  2001  . Pituitary excision  1976  . Dg av dialysis graft declot or      X 4  . Peripheral vascular catheterization N/A 08/21/2015    Procedure: Dialysis/Perma Catheter Insertion;  Surgeon: Renford Dills, MD;  Location: [facility] INVASIVE CV  LAB;  Service: Cardiovascular;  Laterality: N/A;    Allergies: Allergies  Allergen Reactions  . Dhea [Nutritional Supplements] Anaphylaxis    Patient states medication is DHE for migraines, not DHEA  . Demerol [Meperidine] Other (See Comments)    Reaction:  Hallucinations   . Floxin [Ofloxacin] Other (See Comments)    Reaction:  Hallucinations   . Nsaids Nausea And Vomiting and Swelling  . Nubain [Nalbuphine Hcl] Other (See Comments)    Reaction:  Hallucinations   . Phenergan [Promethazine Hcl] Other (See Comments)    Reaction:  Restless legs   . Stadol [Butorphanol] Other (See Comments)    Reaction:  Hallucinations   . Erythromycin Diarrhea and Rash    Home Medications: Prescriptions prior to admission  Medication Sig Dispense Refill Last Dose  . albuterol (PROVENTIL HFA;VENTOLIN HFA) 108 (90 BASE) MCG/ACT inhaler Inhale 2 puffs into the lungs every 6 (six) hours as needed for wheezing or shortness of breath.   prn at prn  . albuterol (PROVENTIL) (2.5 MG/3ML) 0.083% nebulizer solution Take 2.5 mg by nebulization every 6 (  six) hours as needed for wheezing or shortness of breath.   prn at prn  . aspirin 81 MG chewable tablet Chew 81 mg by mouth daily.   02/25/2016 at 0800  . budesonide-formoterol (SYMBICORT) 80-4.5 MCG/ACT inhaler Inhale 2 puffs into the lungs 2 (two) times daily.   02/25/2016 at 0800  . cyclobenzaprine (FLEXERIL) 10 MG tablet Take 10 mg by mouth 3 (three) times daily as needed for muscle spasms.   PRN at prn  . Emollient (EQL ADVANCED RECOVERY) LOTN Apply 1 application topically 2 (two) times daily.   02/25/2016 at 0800  . estradiol (ESTRACE) 0.1 MG/GM vaginal cream Place 1 Applicatorful vaginally 2 (two) times a week.   02/22/2016 at 2000  . fenofibrate (TRICOR) 145 MG tablet Take 145 mg by mouth every other day.    02/24/2016 at 0800  . fludrocortisone (FLORINEF) 0.1 MG tablet Take 0.1 mg by mouth daily.   02/25/2016 at 0800  . folic acid-vitamin b complex-vitamin  c-selenium-zinc (DIALYVITE) 3 MG TABS tablet Take 1 tablet by mouth daily.   02/25/2016 at 0800  . furosemide (LASIX) 40 MG tablet Take 40 mg by mouth 2 (two) times daily.   02/25/2016 at 0800  . gabapentin (NEURONTIN) 100 MG capsule Take 400 mg by mouth at bedtime.   02/24/2016 at 2000  . guaiFENesin (MUCINEX) 600 MG 12 hr tablet Take 1 tablet (600 mg total) by mouth 2 (two) times daily as needed. 60 tablet 2 prn at prn  . guaifenesin (ROBITUSSIN) 100 MG/5ML syrup Take 200 mg by mouth every 6 (six) hours as needed for cough.   prn at prn  . hydrocortisone (CORTEF) 10 MG tablet Take 5-10 mg by mouth 2 (two) times daily. Pt takes 10 mg in the morning and 5 mg at bedtime.   02/25/2016 at 0800  . HYDROmorphone (DILAUDID) 4 MG tablet Take 6 mg by mouth every 6 (six) hours as needed for severe pain.    PRN at prn  . hydrOXYzine (ATARAX/VISTARIL) 25 MG tablet Take 25 mg by mouth 3 (three) times daily.   02/25/2016 at 0800  . levothyroxine (SYNTHROID, LEVOTHROID) 112 MCG tablet Take 112 mcg by mouth daily.    02/25/2016 at 0600  . loratadine (CLARITIN) 10 MG tablet Take 10 mg by mouth daily.   02/25/2016 at 0800  . lovastatin (MEVACOR) 40 MG tablet Take 20 mg by mouth every evening.    02/24/2016 at 1800  . Melatonin 5 MG TABS Take 2 tablets by mouth at bedtime.   02/24/2016 at 2000  . methylphenidate (RITALIN) 20 MG tablet Take 20-30 mg by mouth 2 (two) times daily. Pt takes 30 mg in the morning and 20 mg at lunch.   02/25/2016 at 1200  . Multiple Vitamin (MULTIVITAMIN WITH MINERALS) TABS tablet Take 1 tablet by mouth daily.   02/25/2016 at 0800  . ondansetron (ZOFRAN-ODT) 8 MG disintegrating tablet Take 8 mg by mouth every 8 (eight) hours as needed for nausea or vomiting.   PRN at prn  . Oxycodone HCl 10 MG TABS Take 10 mg by mouth every 8 (eight) hours.   02/25/2016 at 0600  . Phenylephrine-Acetaminophen (SUDAFED PE PRESSURE + PAIN) 5-325 MG TABS Take 1-2 tablets by mouth every 4 (four) hours as needed.   prn at prn   . polyethylene glycol (MIRALAX / GLYCOLAX) packet Take 17 g by mouth daily as needed for mild constipation.    PRN at prn  . risperidone (RISPERDAL) 4 MG tablet Take  2-4 mg by mouth 2 (two) times daily. Pt takes 4 mg in the morning and 2 mg at bedtime.   02/25/2016 at 0800  . sertraline (ZOLOFT) 50 MG tablet Take 50 mg by mouth daily.   02/25/2016 at 0800  . sevelamer carbonate (RENVELA) 800 MG tablet Take 4,000 mg by mouth 3 (three) times daily with meals.   02/25/2016 at 0800  . topiramate (TOPAMAX) 200 MG tablet Take 200 mg by mouth 2 (two) times daily. Pt takes with a 50 mg tablet.   02/25/2016 at 0800  . topiramate (TOPAMAX) 50 MG tablet Take 50 mg by mouth 2 (two) times daily. Pt takes with a 200 mg tablet.   02/25/2016 at 0800  . Vitamin D, Ergocalciferol, (DRISDOL) 50000 UNITS CAPS capsule Take 50,000 Units by mouth every 30 (thirty) days.   02/06/2016 at 0800   Home medication reconciliation was completed with the patient.   Scheduled Inpatient Medications:   . sodium chloride   Intravenous Once  . sodium chloride   Intravenous Once  . [START ON 02/27/2016] epoetin (EPOGEN/PROCRIT) injection  10,000 Units Intravenous Q T,Th,Sa-HD  . estradiol  1 Applicatorful Vaginal Once per day on Mon Thu  . fenofibrate  160 mg Oral Daily  . fludrocortisone  0.1 mg Oral Daily  . furosemide  40 mg Oral BID  . gabapentin  400 mg Oral QHS  . hydrocortisone  10 mg Oral q morning - 10a  . hydrocortisone  5 mg Oral QHS  . hydrOXYzine  25 mg Oral TID  . levothyroxine  112 mcg Oral QAC breakfast  . loratadine  10 mg Oral Daily  . methylphenidate  20 mg Oral Q lunch  . methylphenidate  30 mg Oral QAC breakfast  . mometasone-formoterol  2 puff Inhalation BID  . multivitamin with minerals  1 tablet Oral Daily  . oxyCODONE  10 mg Oral Q8H  . pravastatin  40 mg Oral q1800  . risperiDONE  2 mg Oral QHS  . risperidone  4 mg Oral q morning - 10a  . sertraline  50 mg Oral Daily  . sevelamer carbonate  4,000 mg  Oral TID WC  . topiramate  200 mg Oral BID  . topiramate  50 mg Oral BID  . Vitamin D (Ergocalciferol)  50,000 Units Oral Q30 days    Continuous Inpatient Infusions:     PRN Inpatient Medications:  acetaminophen **OR** acetaminophen, albuterol, cyclobenzaprine, guaiFENesin, guaifenesin, HYDROmorphone, ondansetron **OR** ondansetron (ZOFRAN) IV, ondansetron, polyethylene glycol  Family History: family history is not on file.  The patient's family history is negative for inflammatory bowel disorders, GI malignancy, or solid organ transplantation.  Social History:   reports that she has been smoking Cigarettes.  She has a 8 pack-year smoking history. She does not have any smokeless tobacco history on file. She reports that she does not drink alcohol or use illicit drugs. The patient denies ETOH, tobacco, or drug use.   Review of Systems: Constitutional: Weight is stable.  Eyes: No changes in vision. ENT: No oral lesions, sore throat.  GI: see HPI.  Heme/Lymph: No easy bruising.  CV: No chest pain.  GU: No hematuria.  Integumentary: No rashes.  Neuro: No headaches.  Psych: No depression/anxiety.  Endocrine: No heat/cold intolerance.  Allergic/Immunologic: No urticaria.  Resp: No cough, SOB.  Musculoskeletal: No joint swelling.    Physical Examination: BP 157/70 mmHg  Pulse 93  Temp(Src) 98.9 F (37.2 C) (Oral)  Resp 18  Ht 4\' 10"  (  1.473 m)  Wt 84.5 kg (186 lb 4.6 oz)  BMI 38.94 kg/m2  SpO2 100%  LMP 11/21/2007 (Approximate) Gen: NAD, alert and oriented x 4 HEENT: unable to track Neck: supple, no JVD or thyromegaly Chest: CTA bilaterally, no wheezes, crackles, or other adventitious sounds CV: RRR, no m/g/c/r Abd: soft, NT, ND, +BS in all four quadrants; no HSM, guarding, ridigity, or rebound tenderness Ext: bilateral lower edema Skin: no rash or lesions noted Lymph: no LAD  Data: Lab Results  Component Value Date   WBC 6.2 02/26/2016   HGB 6.1* 02/26/2016   HCT  18.0* 02/26/2016   MCV 90.8 02/26/2016   PLT 173 02/26/2016    Recent Labs Lab 02/25/16 1218 02/26/16 0527 02/26/16 1203  HGB 5.1* 5.6* 6.1*   Lab Results  Component Value Date   NA 135 02/25/2016   K 3.5 02/25/2016   CL 99* 02/25/2016   CO2 25 02/25/2016   BUN 51* 02/25/2016   CREATININE 4.73* 02/25/2016   Lab Results  Component Value Date   ALT 22 10/23/2015   AST 48* 10/23/2015   ALKPHOS 65 10/23/2015   BILITOT 0.8 10/23/2015   No results for input(s): APTT, INR, PTT in the last 168 hours.  Assessment/Plan: Ms. Erdmann is a 45 y.o. female with GI bleed, hemoglobin on admission 5.6, received 1 unit of blood, hemoglobin currently 6.1.  Patient was seen in hemodialysis.  Recommendations: We recommend a capsule endoscopy be completed during admission.  We also recommend she be started on Prilosec 20 mg once daily.  We will continue to follow with you. Thank you for the consult. Please call with questions or concerns.  Carney Harder, PA-C  I personally performed these services.

## 2016-02-27 LAB — CBC
HCT: 25 % — ABNORMAL LOW (ref 35.0–47.0)
Hemoglobin: 8.5 g/dL — ABNORMAL LOW (ref 12.0–16.0)
MCH: 31.6 pg (ref 26.0–34.0)
MCHC: 34.2 g/dL (ref 32.0–36.0)
MCV: 92.4 fL (ref 80.0–100.0)
PLATELETS: 196 10*3/uL (ref 150–440)
RBC: 2.7 MIL/uL — AB (ref 3.80–5.20)
RDW: 16.1 % — AB (ref 11.5–14.5)
WBC: 6.6 10*3/uL (ref 3.6–11.0)

## 2016-02-27 LAB — HEMOGLOBIN: Hemoglobin: 7.9 g/dL — ABNORMAL LOW (ref 12.0–16.0)

## 2016-02-27 LAB — TYPE AND SCREEN
ABO/RH(D): A POS
Antibody Screen: NEGATIVE
UNIT DIVISION: 0
UNIT DIVISION: 0
Unit division: 0

## 2016-02-27 LAB — BASIC METABOLIC PANEL
ANION GAP: 10 (ref 5–15)
BUN: 12 mg/dL (ref 6–20)
CALCIUM: 7.9 mg/dL — AB (ref 8.9–10.3)
CO2: 30 mmol/L (ref 22–32)
Chloride: 101 mmol/L (ref 101–111)
Creatinine, Ser: 2.11 mg/dL — ABNORMAL HIGH (ref 0.44–1.00)
GFR calc Af Amer: 32 mL/min — ABNORMAL LOW (ref >60)
GFR, EST NON AFRICAN AMERICAN: 28 mL/min — AB (ref >60)
GLUCOSE: 79 mg/dL (ref 65–99)
POTASSIUM: 3.3 mmol/L — AB (ref 3.5–5.1)
SODIUM: 141 mmol/L (ref 135–145)

## 2016-02-27 LAB — GLUCOSE, CAPILLARY: GLUCOSE-CAPILLARY: 116 mg/dL — AB (ref 65–99)

## 2016-02-27 LAB — HEPATITIS B SURFACE ANTIGEN: HEP B S AG: NEGATIVE

## 2016-02-27 LAB — HEPATITIS B SURFACE ANTIBODY, QUANTITATIVE: HEPATITIS B-POST: 87.8 m[IU]/mL

## 2016-02-27 MED ORDER — EPOETIN ALFA 10000 UNIT/ML IJ SOLN: 10000.0000 [IU] | INTRAMUSCULAR | Status: DC

## 2016-02-27 MED ORDER — PANTOPRAZOLE SODIUM 40 MG PO TBEC: 40.0000 mg | DELAYED_RELEASE_TABLET | Freq: Every day | ORAL | Status: AC

## 2016-02-27 MED ORDER — SODIUM CHLORIDE 0.9 % IV SOLN: Freq: Once | INTRAVENOUS | Status: DC

## 2016-02-27 NOTE — Progress Notes (Signed)
Subjective:  Patient known to our practice from outpatient dialysis Admitted for low hemoglobin and confusion. Patient presented to ER for right calf pain Oriented now Suspected GI bleed. Hgb 5.1. Given multiple units of PRBC  Capsule endoscopy planned  Patient refused permcath removal yesterday at bedside. Discussed with Dr Wyn Quaker can be arranged for removal in specials on Monday    Objective:  Vital signs in last 24 hours:  Temp:  [98.2 F (36.8 C)-99.4 F (37.4 C)] 98.2 F (36.8 C) (04/29 1329) Pulse Rate:  [66-85] 77 (04/29 1329) Resp:  [17-20] 17 (04/29 1329) BP: (128-158)/(54-72) 158/68 mmHg (04/29 1329) SpO2:  [99 %-100 %] 99 % (04/29 1329)  Weight change: 0 kg (0 lb) Filed Weights   02/25/16 1755 02/26/16 1100 02/26/16 1415  Weight: 84.5 kg (186 lb 4.6 oz) 84.5 kg (186 lb 4.6 oz) 84.5 kg (186 lb 4.6 oz)    Intake/Output:    Intake/Output Summary (Last 24 hours) at 02/27/16 1532 Last data filed at 02/27/16 1300  Gross per 24 hour  Intake   1992 ml  Output      0 ml  Net   1992 ml     Physical Exam: General: NAD, laying in bed  HEENT Anicteric, moist mucus membranes  Neck supple  Pulm/lungs Clear, room air, normal effort  CVS/Heart Regular, no rub, harsh systolic murmur  Abdomen:  Soft, non tender  Extremities: No peripheral edema  Neurologic: Alert, able to follow commands`  Skin: No acute rashes  Access: Left IJ permcath, Right arm AV graft       Basic Metabolic Panel:   Recent Labs Lab 02/25/16 1218 02/27/16 0603  NA 135 141  K 3.5 3.3*  CL 99* 101  CO2 25 30  GLUCOSE 73 79  BUN 51* 12  CREATININE 4.73* 2.11*  CALCIUM 7.8* 7.9*     CBC:  Recent Labs Lab 02/25/16 1218 02/26/16 0527 02/26/16 1203 02/26/16 1817 02/26/16 2333 02/27/16 0603 02/27/16 1016  WBC 11.1* 6.2  --   --   --  6.6  --   HGB 5.1* 5.6* 6.1* 9.1* 8.7* 8.5* 7.9*  HCT 15.6* 16.3* 18.0* 26.3* 25.8* 25.0*  --   MCV 91.7 90.8  --   --   --  92.4  --   PLT 232 173   --   --   --  196  --       Microbiology:  Recent Results (from the past 720 hour(s))  MRSA PCR Screening     Status: None   Collection Time: 02/26/16  6:02 AM  Result Value Ref Range Status   MRSA by PCR NEGATIVE NEGATIVE Final    Comment:        The GeneXpert MRSA Assay (FDA approved for NASAL specimens only), is one component of a comprehensive MRSA colonization surveillance program. It is not intended to diagnose MRSA infection nor to guide or monitor treatment for MRSA infections.     Coagulation Studies: No results for input(s): LABPROT, INR in the last 72 hours.  Urinalysis: No results for input(s): COLORURINE, LABSPEC, PHURINE, GLUCOSEU, HGBUR, BILIRUBINUR, KETONESUR, PROTEINUR, UROBILINOGEN, NITRITE, LEUKOCYTESUR in the last 72 hours.  Invalid input(s): APPERANCEUR    Imaging: No results found.   Medications:     . sodium chloride   Intravenous Once  . [START ON 02/29/2016] epoetin (EPOGEN/PROCRIT) injection  10,000 Units Intravenous Q M,W,F-HD  . estradiol  1 Applicatorful Vaginal Once per day on Mon Thu  . fenofibrate  160 mg Oral Daily  . fludrocortisone  0.1 mg Oral Daily  . furosemide  40 mg Oral BID  . gabapentin  400 mg Oral QHS  . hydrocortisone  10 mg Oral q morning - 10a  . hydrocortisone  5 mg Oral QHS  . hydrOXYzine  25 mg Oral TID  . levothyroxine  112 mcg Oral QAC breakfast  . loratadine  10 mg Oral Daily  . methylphenidate  20 mg Oral Q lunch  . methylphenidate  30 mg Oral QAC breakfast  . mometasone-formoterol  2 puff Inhalation BID  . multivitamin with minerals  1 tablet Oral Daily  . oxyCODONE  10 mg Oral Q8H  . pravastatin  40 mg Oral q1800  . risperiDONE  2 mg Oral QHS  . risperidone  4 mg Oral q morning - 10a  . sertraline  50 mg Oral Daily  . sevelamer carbonate  4,000 mg Oral TID WC  . topiramate  200 mg Oral BID  . topiramate  50 mg Oral BID  . Vitamin D (Ergocalciferol)  50,000 Units Oral Q30 days   acetaminophen  **OR** acetaminophen, albuterol, cyclobenzaprine, guaiFENesin, guaifenesin, HYDROmorphone, ondansetron **OR** ondansetron (ZOFRAN) IV, ondansetron, polyethylene glycol  Assessment/ Plan:  45 y.o. female with ESRD, Fibromyalgia, psychosis, HLD, h/o stroke, DVT, depression, bipolar disorder, blindness  1. ESRD- [facility]/ MWF-2/ [facility] - Dialysis while in the hospital - Now that AV graft is working, we will request vascular to remove the PermCath - patient refused bedside removal yesterday. Discussed with Dr Wyn Quaker. Plan for removal in specials on monday  2. AoCKD and blood loss - GI bleed suspected Work up in progress; blood transfusion given 4/27 - iv procrit with HD    3. SHPTH - sevelamer with meals     LOS: 2 Nahuel Wilbert 4/29/20173:32 PM

## 2016-02-27 NOTE — Progress Notes (Signed)
[facility] [place] [facility]   PATIENT NAME: Erin [name]    MR#:  [id number]  DATE OF BIRTH:  05-Apr-1971  SUBJECTIVE:  No acute issues  REVIEW OF SYSTEMS:    Review of Systems  Constitutional: Negative for fever, chills and malaise/fatigue.  HENT: Negative for ear discharge, ear pain, hearing loss, nosebleeds and sore throat.   Eyes: Negative for blurred vision and pain.  Respiratory: Negative for cough, hemoptysis, shortness of breath and wheezing.   Cardiovascular: Negative for chest pain, palpitations and leg swelling.  Gastrointestinal: Negative for nausea, vomiting, abdominal pain, diarrhea and blood in stool.  Genitourinary: Negative for dysuria.  Musculoskeletal: Negative for back pain.  Neurological: Negative for dizziness, tremors, speech change, focal weakness, seizures and headaches.  Endo/Heme/Allergies: Does not bruise/bleed easily.  Psychiatric/Behavioral: Negative for depression, suicidal ideas and hallucinations.    Tolerating Diet:yes      DRUG ALLERGIES:   Allergies  Allergen Reactions  . Dhea [Nutritional Supplements] Anaphylaxis    Patient states medication is DHE for migraines, not DHEA  . Demerol [Meperidine] Other (See Comments)    Reaction:  Hallucinations   . Floxin [Ofloxacin] Other (See Comments)    Reaction:  Hallucinations   . Nsaids Nausea And Vomiting and Swelling  . Nubain [Nalbuphine Hcl] Other (See Comments)    Reaction:  Hallucinations   . Phenergan [Promethazine Hcl] Other (See Comments)    Reaction:  Restless legs   . Stadol [Butorphanol] Other (See Comments)    Reaction:  Hallucinations   . Erythromycin Diarrhea and Rash    VITALS:  Blood pressure 152/55, pulse 70, temperature 99.4 F (37.4 C), temperature source Oral, resp. rate 20, height 4\' 10"  (1.473 m), weight 84.5 kg (186 lb 4.6 oz), last menstrual period 11/21/2007, SpO2 99 %.  PHYSICAL EXAMINATION:   Physical Exam  Constitutional: She  is oriented to person, place, and time and well-developed, well-nourished, and in no distress. No distress.  HENT:  Head: Normocephalic.  Eyes: No scleral icterus.  Neck: Normal range of motion. Neck supple. No JVD present. No tracheal deviation present.  Cardiovascular: Normal rate, regular rhythm and normal heart sounds.  Exam reveals no gallop and no friction rub.   No murmur heard. Pulmonary/Chest: Effort normal and breath sounds normal. No respiratory distress. She has no wheezes. She has no rales. She exhibits no tenderness.  Abdominal: Soft. Bowel sounds are normal. She exhibits no distension and no mass. There is no tenderness. There is no rebound and no guarding.  Musculoskeletal: Normal range of motion. She exhibits no edema.  Neurological: She is alert and oriented to person, place, and time.  Skin: Skin is warm. No rash noted. No erythema.  Psychiatric: Affect and judgment normal.      LABORATORY PANEL:   CBC  Recent Labs Lab 02/27/16 0603 02/27/16 1016  WBC 6.6  --   HGB 8.5* 7.9*  HCT 25.0*  --   PLT 196  --    ------------------------------------------------------------------------------------------------------------------  Chemistries   Recent Labs Lab 02/27/16 0603  NA 141  K 3.3*  CL 101  CO2 30  GLUCOSE 79  BUN 12  CREATININE 2.11*  CALCIUM 7.9*   ------------------------------------------------------------------------------------------------------------------  Cardiac Enzymes  Recent Labs Lab 02/25/16 1218  TROPONINI 0.05*   ------------------------------------------------------------------------------------------------------------------  RADIOLOGY:  Dg Chest Portable 1 View  02/25/2016  CLINICAL DATA:  Chest pain, hemodialysis EXAM: PORTABLE CHEST 1 VIEW COMPARISON:  11/03/2015 FINDINGS: Cardiomegaly again noted. Dual lumen left IJ catheter is  unchanged in position. No acute infiltrate or pleural effusion. No pulmonary edema. IMPRESSION:  No active disease. Electronically Signed   By: Natasha Mead M.D.   On: 02/25/2016 15:14     ASSESSMENT AND PLAN:   45 y.o. 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  1. Acute on chronic anemia likely due to slow GI bleed versus anemia of chronic disease. She received 3 units of PRBC.  She also received EPO during dialysis. She had an unremarkable upper GI and colonoscopy in August 2016 at [facility]. The plan was for capsule endoscopy, however she initially refused.  She now has agreed Repeat hgb this am 7.9 cannot discharge.   2. End-stage renal disease on hemodialysis: She will continue HD on her regular outpatient days.   3. Fibromyalgia, history of narcotic abuse, chronic pain syndrome Continue patient's home meds Patient goes to pain clinic in [facility]  4. History of seizures: She will continue her medications.  5. Bipolar/depression: She was evaluated by Psychiatrist here and Dr Toni Amend did not recommend changing her medications. She did not meet inpatient Psychiatric criteria and is at her baseline.   6. Hyperlipidemia; She will continue Lovastatin       Management plans discussed with the patient and she is in agreement.  CODE STATUS: FULL  TOTAL TIME TAKING CARE OF THIS PATIENT: 30 minutes.     POSSIBLE D/C 1-2 days, DEPENDING ON CLINICAL CONDITION.   Oddie Bottger M.D on 02/27/2016 at 11:19 AM  Between 7am to 6pm - Pager - [phone] After 6pm go to [email or web address] - password [email or web address]  [facility]  Office  [phone]  CC: Primary care physician; Pcp Not In System  Note: This dictation was prepared with Dragon  dictation along with smaller phrase technology. Any transcriptional errors that result from this process are unintentional.

## 2016-02-27 NOTE — Progress Notes (Signed)
GI Inpatient Follow-up Note  Patient Identification: Erin [name] is a 45 y.o. female with recurrent severe anemia.   Subjective:   No blood in stools or melena.  No rectal bleeding.  Hgb down to 7.9.  No f/c, no abd pain, no n/v.   Scheduled Inpatient Medications:  . sodium chloride   Intravenous Once  . [START ON 02/29/2016] epoetin (EPOGEN/PROCRIT) injection  10,000 Units Intravenous Q M,W,F-HD  . estradiol  1 Applicatorful Vaginal Once per day on Mon Thu  . fenofibrate  160 mg Oral Daily  . fludrocortisone  0.1 mg Oral Daily  . furosemide  40 mg Oral BID  . gabapentin  400 mg Oral QHS  . hydrocortisone  10 mg Oral q morning - 10a  . hydrocortisone  5 mg Oral QHS  . hydrOXYzine  25 mg Oral TID  . levothyroxine  112 mcg Oral QAC breakfast  . loratadine  10 mg Oral Daily  . methylphenidate  20 mg Oral Q lunch  . methylphenidate  30 mg Oral QAC breakfast  . mometasone-formoterol  2 puff Inhalation BID  . multivitamin with minerals  1 tablet Oral Daily  . oxyCODONE  10 mg Oral Q8H  . pravastatin  40 mg Oral q1800  . risperiDONE  2 mg Oral QHS  . risperidone  4 mg Oral q morning - 10a  . sertraline  50 mg Oral Daily  . sevelamer carbonate  4,000 mg Oral TID WC  . topiramate  200 mg Oral BID  . topiramate  50 mg Oral BID  . Vitamin D (Ergocalciferol)  50,000 Units Oral Q30 days    Continuous Inpatient Infusions:     PRN Inpatient Medications:  acetaminophen **OR** acetaminophen, albuterol, cyclobenzaprine, guaiFENesin, guaifenesin, HYDROmorphone, ondansetron **OR** ondansetron (ZOFRAN) IV, ondansetron, polyethylene glycol  Review of Systems: Constitutional: Weight is stable.  Eyes: +blind ENT: No oral lesions, sore throat.  GI: see HPI.  Heme/Lymph: + easy bruising.  CV: No chest pain.  GU: No hematuria.  Integumentary: No rashes.  Neuro: No headaches.  Psych:+depression/anxiety.  Endocrine: No heat/cold intolerance.  Allergic/Immunologic: No urticaria.  Resp:  No cough, SOB.  Musculoskeletal: No joint swelling.    Physical Examination: BP 135/66 mmHg  Pulse 73  Temp(Src) 98.1 F (36.7 C) (Oral)  Resp 18  Ht 4\' 10"  (1.473 m)  Wt 84.5 kg (186 lb 4.6 oz)  BMI 38.94 kg/m2  SpO2 97%  LMP 11/21/2007 (Approximate) Gen: NAD, alert and oriented x 3 Neck: supple, no JVD or thyromegaly Chest: CTA bilaterally, no wheezes, crackles, or other adventitious sounds CV: RRR, no m/g/c/r Abd: soft, NT, ND, +BS in all four quadrants; no HSM, guarding, ridigity, or rebound tenderness Ext: no edema, well perfused with 2+ pulses, Skin: no rash or lesions noted Lymph: no LAD  Data: Lab Results  Component Value Date   WBC 6.6 02/27/2016   HGB 7.9* 02/27/2016   HCT 25.0* 02/27/2016   MCV 92.4 02/27/2016   PLT 196 02/27/2016    Recent Labs Lab 02/26/16 2333 02/27/16 0603 02/27/16 1016  HGB 8.7* 8.5* 7.9*   Lab Results  Component Value Date   NA 141 02/27/2016   K 3.3* 02/27/2016   CL 101 02/27/2016   CO2 30 02/27/2016   BUN 12 02/27/2016   CREATININE 2.11* 02/27/2016   Lab Results  Component Value Date   ALT 22 10/23/2015   AST 48* 10/23/2015   ALKPHOS 65 10/23/2015   BILITOT 0.8 10/23/2015   No results  for input(s): APTT, INR, PTT in the last 168 hours.   Assessment/Plan: Ms. Delatorre is a 45 y.o. female with severe recurrent anemia, likely multifactorial from ESRD, ACD,  No iron studies avail. hgb own some today.  Already s/p EGD and colon within the year.   Recommendations: - will plan for capsule endoscopy if Hgb continues to fall any further and if she is willing to do clear liquids and drink prep.   Please call with questions or concerns.  REIN, Addison Naegeli, MD

## 2016-02-27 NOTE — NC FL2 (Signed)
[place] MEDICAID FL2 LEVEL OF CARE SCREENING TOOL     IDENTIFICATION  Patient Name: Erin [name] Birthdate: 08-08-1971 Sex: female Admission Date (Current Location): 02/25/2016  [facility] and [place] Number:  Chiropodist and Address:  [facility], [address], [place], [place] [address]      Provider Number: [id number]  Attending Physician Name and Address:  [name], MD  Relative Name and Phone Number:       Current Level of Care: Hospital Recommended Level of Care: Assisted Living Facility Prior Approval Number:    Date Approved/Denied:   PASRR Number:    Discharge Plan:  (ALF)    Current Diagnoses: Patient Active Problem List   Diagnosis Date Noted  . Bipolar 1 disorder, mixed, moderate (HCC) 02/26/2016  . 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 Anemia 02/25/2016    Orientation RESPIRATION BLADDER Height & Weight     Self, Situation, Place  Normal Continent Weight: 186 lb 4.6 oz (84.5 kg) Height:  4\' 10"  (147.3 cm)  BEHAVIORAL SYMPTOMS/MOOD NEUROLOGICAL BOWEL NUTRITION STATUS   (none)  (none) Continent Diet (renal and fluid restriction)  AMBULATORY STATUS COMMUNICATION OF NEEDS Skin   Supervision Verbally Bruising                       Personal Care Assistance Level of Assistance  Bathing, Dressing Bathing Assistance: Limited assistance   Dressing Assistance: Limited assistance     Functional Limitations Info  Sight Sight Info: Impaired        SPECIAL CARE FACTORS FREQUENCY   (outpatient dialysis)                    Contractures Contractures Info: Not present    Additional Factors Info  Code Status, Allergies Code Status Info: Full Allergies Info: dhea, demerol, floxin, nsaids, nubain, phenergan, stadol, erythromycin           Current Medications (02/27/2016):  This is the current hospital active medication  list Current Facility-Administered Medications  Medication Dose Route Frequency Provider Last Rate Last Dose  . 0.9 %  sodium chloride infusion   Intravenous Once Enedina Finner, MD      . acetaminophen (TYLENOL) tablet 650 mg  650 mg Oral Q6H PRN Enedina Finner, MD       Or  . acetaminophen (TYLENOL) suppository 650 mg  650 mg Rectal Q6H PRN Enedina Finner, MD      . albuterol (PROVENTIL) (2.5 MG/3ML) 0.083% nebulizer solution 2.5 mg  2.5 mg Nebulization Q6H PRN Enedina Finner, MD      . cyclobenzaprine (FLEXERIL) tablet 10 mg  10 mg Oral TID PRN Enedina Finner, MD      . epoetin alfa (EPOGEN,PROCRIT) injection 10,000 Units  10,000 Units Intravenous Q T,Th,Sa-HD Mosetta Pigeon, MD      . estradiol (ESTRACE) vaginal cream 1 Applicatorful  1 Applicatorful Vaginal Once per day on Mon Thu Enedina Finner, MD      . fenofibrate tablet 160 mg  160 mg Oral Daily Enedina Finner, MD   160 mg at 02/27/16 1017  . fludrocortisone (FLORINEF) tablet 0.1 mg  0.1 mg Oral Daily Enedina Finner, MD   0.1 mg at 02/27/16 1015  . furosemide (LASIX) tablet 40 mg  40 mg Oral BID Enedina Finner, MD   40 mg at 02/27/16 0830  . gabapentin (NEURONTIN) capsule 400 mg  400 mg Oral QHS Enedina Finner,  MD   400 mg at 02/26/16 2133  . guaiFENesin (MUCINEX) 12 hr tablet 600 mg  600 mg Oral BID PRN Enedina Finner, MD      . guaifenesin (ROBITUSSIN) 100 MG/5ML syrup 200 mg  200 mg Oral Q6H PRN Enedina Finner, MD      . hydrocortisone (CORTEF) tablet 10 mg  10 mg Oral q morning - 10a Enedina Finner, MD   10 mg at 02/27/16 1015  . hydrocortisone (CORTEF) tablet 5 mg  5 mg Oral QHS Enedina Finner, MD   5 mg at 02/26/16 2132  . HYDROmorphone (DILAUDID) tablet 6 mg  6 mg Oral Q6H PRN Enedina Finner, MD   6 mg at 02/27/16 1610  . hydrOXYzine (ATARAX/VISTARIL) tablet 25 mg  25 mg Oral TID Enedina Finner, MD   25 mg at 02/26/16 1418  . levothyroxine (SYNTHROID, LEVOTHROID) tablet 112 mcg  112 mcg Oral QAC breakfast Enedina Finner, MD   112 mcg at 02/27/16 0830  . loratadine (CLARITIN) tablet 10 mg  10 mg  Oral Daily Enedina Finner, MD   10 mg at 02/27/16 1019  . methylphenidate (RITALIN) tablet 20 mg  20 mg Oral Q lunch Enedina Finner, MD   20 mg at 02/26/16 1416  . methylphenidate (RITALIN) tablet 30 mg  30 mg Oral QAC breakfast Enedina Finner, MD   30 mg at 02/27/16 0831  . mometasone-formoterol (DULERA) 100-5 MCG/ACT inhaler 2 puff  2 puff Inhalation BID Enedina Finner, MD   2 puff at 02/26/16 2006  . multivitamin with minerals tablet 1 tablet  1 tablet Oral Daily Enedina Finner, MD      . ondansetron (ZOFRAN) tablet 4 mg  4 mg Oral Q6H PRN Enedina Finner, MD       Or  . ondansetron (ZOFRAN) injection 4 mg  4 mg Intravenous Q6H PRN Enedina Finner, MD   4 mg at 02/27/16 0654  . ondansetron (ZOFRAN-ODT) disintegrating tablet 8 mg  8 mg Oral Q8H PRN Enedina Finner, MD      . oxyCODONE (Oxy IR/ROXICODONE) immediate release tablet 10 mg  10 mg Oral Q8H Enedina Finner, MD   10 mg at 02/27/16 0504  . polyethylene glycol (MIRALAX / GLYCOLAX) packet 17 g  17 g Oral Daily PRN Enedina Finner, MD      . pravastatin (PRAVACHOL) tablet 40 mg  40 mg Oral q1800 Enedina Finner, MD   40 mg at 02/26/16 1808  . risperiDONE (RISPERDAL) tablet 2 mg  2 mg Oral QHS Enedina Finner, MD   2 mg at 02/26/16 2130  . risperiDONE (RISPERDAL) tablet 4 mg  4 mg Oral q morning - 10a Enedina Finner, MD   4 mg at 02/27/16 1025  . sertraline (ZOLOFT) tablet 50 mg  50 mg Oral Daily Enedina Finner, MD   50 mg at 02/27/16 1021  . sevelamer carbonate (RENVELA) tablet 4,000 mg  4,000 mg Oral TID WC Enedina Finner, MD   4,000 mg at 02/27/16 9604  . topiramate (TOPAMAX) tablet 200 mg  200 mg Oral BID Enedina Finner, MD   200 mg at 02/27/16 1022  . topiramate (TOPAMAX) tablet 50 mg  50 mg Oral BID Enedina Finner, MD   50 mg at 02/27/16 1023  . Vitamin D (Ergocalciferol) (DRISDOL) capsule 50,000 Units  50,000 Units Oral Q30 days Enedina Finner, MD         Discharge Medications: Please see discharge summary for a list of discharge medications.  Current Discharge Medication List  START taking these  medications   Details  pantoprazole (PROTONIX) 40 MG tablet Take 1 tablet (40 mg total) by mouth daily. Qty: 30 tablet, Refills: 0      CONTINUE these medications which have NOT CHANGED   Details  albuterol (PROVENTIL HFA;VENTOLIN HFA) 108 (90 BASE) MCG/ACT inhaler Inhale 2 puffs into the lungs every 6 (six) hours as needed for wheezing or shortness of breath.    albuterol (PROVENTIL) (2.5 MG/3ML) 0.083% nebulizer solution Take 2.5 mg by nebulization every 6 (six) hours as needed for wheezing or shortness of breath.    budesonide-formoterol (SYMBICORT) 80-4.5 MCG/ACT inhaler Inhale 2 puffs into the lungs 2 (two) times daily.    cyclobenzaprine (FLEXERIL) 10 MG tablet Take 10 mg by mouth 3 (three) times daily as needed for muscle spasms.    Emollient (EQL ADVANCED RECOVERY) LOTN Apply 1 application topically 2 (two) times daily.    estradiol (ESTRACE) 0.1 MG/GM vaginal cream Place 1 Applicatorful vaginally 2 (two) times a week.    fenofibrate (TRICOR) 145 MG tablet Take 145 mg by mouth every other day.     fludrocortisone (FLORINEF) 0.1 MG tablet Take 0.1 mg by mouth daily.    folic acid-vitamin b complex-vitamin c-selenium-zinc (DIALYVITE) 3 MG TABS tablet Take 1 tablet by mouth daily.    furosemide (LASIX) 40 MG tablet Take 40 mg by mouth 2 (two) times daily.    gabapentin (NEURONTIN) 100 MG capsule Take 400 mg by mouth at bedtime.    guaiFENesin (MUCINEX) 600 MG 12 hr tablet Take 1 tablet (600 mg total) by mouth 2 (two) times daily as needed. Qty: 60 tablet, Refills: 2    guaifenesin (ROBITUSSIN) 100 MG/5ML syrup Take 200 mg by mouth every 6 (six) hours as needed for cough.    hydrocortisone (CORTEF) 10 MG tablet Take 5-10 mg by mouth 2 (two) times daily. Pt takes 10 mg in the morning and 5 mg at bedtime.    HYDROmorphone (DILAUDID) 4 MG tablet Take 6 mg by mouth every 6 (six) hours as needed for severe pain.      hydrOXYzine (ATARAX/VISTARIL) 25 MG tablet Take 25 mg by mouth 3 (three) times daily.    levothyroxine (SYNTHROID, LEVOTHROID) 112 MCG tablet Take 112 mcg by mouth daily.     loratadine (CLARITIN) 10 MG tablet Take 10 mg by mouth daily.    lovastatin (MEVACOR) 40 MG tablet Take 20 mg by mouth every evening.     Melatonin 5 MG TABS Take 2 tablets by mouth at bedtime.    methylphenidate (RITALIN) 20 MG tablet Take 20-30 mg by mouth 2 (two) times daily. Pt takes 30 mg in the morning and 20 mg at lunch.    Multiple Vitamin (MULTIVITAMIN WITH MINERALS) TABS tablet Take 1 tablet by mouth daily.    ondansetron (ZOFRAN-ODT) 8 MG disintegrating tablet Take 8 mg by mouth every 8 (eight) hours as needed for nausea or vomiting.    Oxycodone HCl 10 MG TABS Take 10 mg by mouth every 8 (eight) hours.    polyethylene glycol (MIRALAX / GLYCOLAX) packet Take 17 g by mouth daily as needed for mild constipation.     risperidone (RISPERDAL) 4 MG tablet Take 2-4 mg by mouth 2 (two) times daily. Pt takes 4 mg in the morning and 2 mg at bedtime.    sertraline (ZOLOFT) 50 MG tablet Take 50 mg by mouth daily.    sevelamer carbonate (RENVELA) 800 MG tablet Take 4,000 mg by mouth 3 (three)  times daily with meals.    !! topiramate (TOPAMAX) 200 MG tablet Take 200 mg by mouth 2 (two) times daily. Pt takes with a 50 mg tablet.    !! topiramate (TOPAMAX) 50 MG tablet Take 50 mg by mouth 2 (two) times daily. Pt takes with a 200 mg tablet.    Vitamin D, Ergocalciferol, (DRISDOL) 50000 UNITS CAPS capsule Take 50,000 Units by mouth every 30 (thirty) days.    !! - Potential duplicate medications found. Please discuss with provider.    STOP taking these medications     aspirin 81 MG chewable tablet      Phenylephrine-Acetaminophen (SUDAFED PE PRESSURE + PAIN) 5-325 MG TABS            Relevant Imaging Results:  Relevant Lab  Results:   Additional Information    Laquandra Vought, LCSW

## 2016-02-27 NOTE — Discharge Summary (Addendum)
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 Fibromyalgia   . Chronic headaches     Seeing Pain Management  . Hyperlipidemia   . Psychosis   . Hypothyroid   . Narcolepsy   . Asthma   . Constipation   . Kidney failure     Currently on Dialysis  . Blind   . Epilepsy (HCC)   . History of DVT (deep vein thrombosis)   . History of CVA (cerebrovascular accident)   . Depression   . Bipolar affect, depressed (HCC)   . Anxiety     HOSPITAL COURSE:   45 y.o. 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  1. Acute on chronic anemia likely due to slow GI bleed versus anemia of chronic disease> She received 3 units of PRBC. Her hemoglobin is stable. She also received EPO during dialysis. She had an unremarkable upper GI and colonoscopy in August 2016 at [facility]. She had a capsule endoscopy and will have follow-up with GI for results on May 4.. She was scheduled to be discharged on April 29 however her hemoglobin 7.9. It has, up to 8 .3 without further blood transfusions. She has  no evidence of acute bleed.  2. End-stage renal disease on hemodialysis: She will continue HD on her regular outpatient days. She had PermCath removed by Dr. Wyn Quaker prior to discharge.  3. Fibromyalgia, history of narcotic abuse, chronic pain syndrome Continue patient's home medications Patient follows up with pail clinic.   4. History of seizures: She will continue her medications at discharge.  5. Bipolar/depression: She was evaluated by Psychiatrist here and Dr Toni Amend did not recommend changing her medications. She did not meet inpatient Psychiatric criteria and is at her baseline.   6. Hyperlipidemia; She will continue Lovastatin  DISCHARGE CONDITIONS AND DIET:   Stable Renal diet  CONSULTS OBTAINED:  Treatment Team:  Mosetta Pigeon, MD Audery Amel, MD Elnita Maxwell, MD Annice Needy, MD  DRUG ALLERGIES:   Allergies  Allergen Reactions  . Dhea [Nutritional Supplements] Anaphylaxis    Patient states medication is DHE for migraines, not DHEA  . Demerol [Meperidine] Other (See Comments)    Reaction:  Hallucinations   . Floxin [Ofloxacin] Other (See Comments)    Reaction:  Hallucinations   . Nsaids Nausea And Vomiting and Swelling  . Nubain [Nalbuphine Hcl] Other (See Comments)    Reaction:  Hallucinations   . Phenergan [Promethazine Hcl] Other (See Comments)    Reaction:  Restless legs   . Stadol [Butorphanol] Other (See Comments)    Reaction:  Hallucinations   . Erythromycin Diarrhea and Rash  DISCHARGE MEDICATIONS:   Current Discharge Medication List    START taking these medications   Details  pantoprazole (PROTONIX) 40 MG tablet Take 1 tablet (40 mg total) by mouth daily. Qty: 30 tablet, Refills: 0      CONTINUE these medications which have NOT CHANGED   Details  albuterol (PROVENTIL HFA;VENTOLIN HFA) 108 (90 BASE) MCG/ACT inhaler Inhale 2 puffs into the lungs every 6 (six) hours as needed for wheezing or shortness of breath.    albuterol  (PROVENTIL) (2.5 MG/3ML) 0.083% nebulizer solution Take 2.5 mg by nebulization every 6 (six) hours as needed for wheezing or shortness of breath.    budesonide-formoterol (SYMBICORT) 80-4.5 MCG/ACT inhaler Inhale 2 puffs into the lungs 2 (two) times daily.    cyclobenzaprine (FLEXERIL) 10 MG tablet Take 10 mg by mouth 3 (three) times daily as needed for muscle spasms.    Emollient (EQL ADVANCED RECOVERY) LOTN Apply 1 application topically 2 (two) times daily.    estradiol (ESTRACE) 0.1 MG/GM vaginal cream Place 1 Applicatorful vaginally 2 (two) times a week.    fenofibrate (TRICOR) 145 MG tablet Take 145 mg by mouth every other day.     fludrocortisone (FLORINEF) 0.1 MG tablet Take 0.1 mg by mouth daily.    folic acid-vitamin b complex-vitamin c-selenium-zinc (DIALYVITE) 3 MG TABS tablet Take 1 tablet by mouth daily.    furosemide (LASIX) 40 MG tablet Take 40 mg by mouth 2 (two) times daily.    gabapentin (NEURONTIN) 100 MG capsule Take 400 mg by mouth at bedtime.    guaiFENesin (MUCINEX) 600 MG 12 hr tablet Take 1 tablet (600 mg total) by mouth 2 (two) times daily as needed. Qty: 60 tablet, Refills: 2    guaifenesin (ROBITUSSIN) 100 MG/5ML syrup Take 200 mg by mouth every 6 (six) hours as needed for cough.    hydrocortisone (CORTEF) 10 MG tablet Take 5-10 mg by mouth 2 (two) times daily. Pt takes 10 mg in the morning and 5 mg at bedtime.    HYDROmorphone (DILAUDID) 4 MG tablet Take 6 mg by mouth every 6 (six) hours as needed for severe pain.     hydrOXYzine (ATARAX/VISTARIL) 25 MG tablet Take 25 mg by mouth 3 (three) times daily.    levothyroxine (SYNTHROID, LEVOTHROID) 112 MCG tablet Take 112 mcg by mouth daily.     loratadine (CLARITIN) 10 MG tablet Take 10 mg by mouth daily.    lovastatin (MEVACOR) 40 MG tablet Take 20 mg by mouth every evening.     Melatonin 5 MG TABS Take 2 tablets by mouth at bedtime.    methylphenidate (RITALIN) 20 MG tablet Take 20-30 mg by mouth 2  (two) times daily. Pt takes 30 mg in the morning and 20 mg at lunch.    Multiple Vitamin (MULTIVITAMIN WITH MINERALS) TABS tablet Take 1 tablet by mouth daily.    ondansetron (ZOFRAN-ODT) 8 MG disintegrating tablet Take 8 mg by mouth every 8 (eight) hours as needed for nausea or vomiting.    Oxycodone HCl 10 MG TABS Take 10 mg by mouth every 8 (eight) hours.    polyethylene glycol (MIRALAX / GLYCOLAX) packet Take 17 g by mouth daily as needed for mild constipation.     risperidone (RISPERDAL) 4 MG tablet Take 2-4 mg by mouth 2 (two) times daily. Pt takes 4 mg in the morning and 2 mg at bedtime.    sertraline (ZOLOFT) 50 MG tablet Take 50 mg by mouth daily.    sevelamer carbonate (  RENVELA) 800 MG tablet Take 4,000 mg by mouth 3 (three) times daily with meals.    !! topiramate (TOPAMAX) 200 MG tablet Take 200 mg by mouth 2 (two) times daily. Pt takes with a 50 mg tablet.    !! topiramate (TOPAMAX) 50 MG tablet Take 50 mg by mouth 2 (two) times daily. Pt takes with a 200 mg tablet.    Vitamin D, Ergocalciferol, (DRISDOL) 50000 UNITS CAPS capsule Take 50,000 Units by mouth every 30 (thirty) days.     !! - Potential duplicate medications found. Please discuss with provider.    STOP taking these medications     aspirin 81 MG chewable tablet      Phenylephrine-Acetaminophen (SUDAFED PE PRESSURE + PAIN) 5-325 MG TABS               Today   CHIEF COMPLAINT:  When can i go home No events overnight Perm cath removed and went for HD last night  VITAL SIGNS:  Blood pressure 152/55, pulse 70, temperature 99.4 F (37.4 C), temperature source Oral, resp. rate 20, height 4\' 10"  (1.473 m), weight 84.5 kg (186 lb 4.6 oz), last menstrual period 11/21/2007, SpO2 99 %.   REVIEW OF SYSTEMS:  Review of Systems  Constitutional: Negative for fever, chills and malaise/fatigue.  HENT: Negative for ear discharge, ear pain, hearing loss, nosebleeds and sore throat.   Eyes: Negative for  blurred vision and pain.  Respiratory: Negative for cough, hemoptysis, shortness of breath and wheezing.   Cardiovascular: Negative for chest pain, palpitations and leg swelling.  Gastrointestinal: Negative for nausea, vomiting, abdominal pain, diarrhea and blood in stool.  Genitourinary: Negative for dysuria.  Musculoskeletal: Negative for back pain.  Neurological: Negative for dizziness, tremors, speech change, focal weakness, seizures and headaches.  Endo/Heme/Allergies: Does not bruise/bleed easily.  Psychiatric/Behavioral: Negative for depression, suicidal ideas and hallucinations.     PHYSICAL EXAMINATION:  GENERAL:  45 y.o.-year-old patient lying in the bed with no acute distress.  NECK:  Supple, no jugular venous distention. No thyroid enlargement, no tenderness.  LUNGS: Normal breath sounds bilaterally, no wheezing, rales,rhonchi  No use of accessory muscles of respiration.  CARDIOVASCULAR: S1, S2 normal. No murmurs, rubs, or gallops.  ABDOMEN: Soft, non-tender, non-distended. Bowel sounds present. No organomegaly or mass.  EXTREMITIES: No pedal edema, cyanosis, or clubbing.  PSYCHIATRIC: The patient is alert and oriented x 3.  SKIN: No obvious rash, lesion, or ulcer. Many bruises on skin  DATA REVIEW:   CBC  Recent Labs Lab 02/27/16 0603  WBC 6.6  HGB 8.5*  HCT 25.0*  PLT 196    Chemistries   Recent Labs Lab 02/27/16 0603  NA 141  K 3.3*  CL 101  CO2 30  GLUCOSE 79  BUN 12  CREATININE 2.11*  CALCIUM 7.9*    Cardiac Enzymes  Recent Labs Lab 02/25/16 1218  TROPONINI 0.05*    Microbiology Results  @MICRORSLT48 @  RADIOLOGY:  Dg Chest Portable 1 View  02/25/2016  CLINICAL DATA:  Chest pain, hemodialysis EXAM: PORTABLE CHEST 1 VIEW COMPARISON:  11/03/2015 FINDINGS: Cardiomegaly again noted. Dual lumen left IJ catheter is unchanged in position. No acute infiltrate or pleural effusion. No pulmonary edema. IMPRESSION: No active disease. Electronically  Signed   By: Natasha Mead M.D.   On: 02/25/2016 15:14      Management plans discussed with the patient and she is in agreement. Stable for discharge   Patient should follow up with GI  CODE STATUS:  Code Status Orders        Start     Ordered   02/25/16 1536  Full code   Continuous     02/25/16 1536    Code Status History    Date Active Date Inactive Code Status Order ID Comments User Context   This patient has a current code status but no historical code status.      TOTAL TIME TAKING CARE OF THIS PATIENT: 35 minutes.    Note: This dictation was prepared with Dragon dictation along with smaller phrase technology. Any transcriptional errors that result from this process are unintentional.  Sharvi Mooneyhan M.D on 03/01/2016 at 8:57 AM  Between 7am to 6pm - Pager - [phone] After 6pm go to [email or web address] - password [email or web address]  [facility]  Office  [phone]  CC: Primary care physician; Pcp Not In System

## 2016-02-27 NOTE — Care Management Note (Signed)
Case Management Note  Patient Details  Name: Erin [name] MRN: [id number] Date of Birth: Jun 20, 1971  Subjective/Objective:     Discussed discharge planning with SW who reports that Ms Erin [name] will be returning to [facility]. SW has discussed legal guardianship with [facility].                Action/Plan:   Expected Discharge Date:                  Expected Discharge Plan:     In-House Referral:     Discharge planning Services     Post Acute Care Choice:    Choice offered to:     DME Arranged:    DME Agency:     HH Arranged:    HH Agency:     Status of Service:     Medicare Important Message Given:    Date Medicare IM Given:    Medicare IM give by:    Date Additional Medicare IM Given:    Additional Medicare Important Message give by:     If discussed at Long Length of Stay Meetings, dates discussed:    Additional Comments:  Erin [name] A, RN 02/27/2016, 10:12 AM

## 2016-02-27 NOTE — Clinical Social Work Note (Signed)
Pt's daughter order was cancelled, per RN. CSW updated [facility] about the cancellation and the possibility for tomorrow (02/28/2016). Discharge packet is on the pt's chart. CSW will continue to follow.   Dede Query, MSW, LCSW  Clinical Social Worker  [phone]

## 2016-02-27 NOTE — Consult Note (Signed)
[facility] Face-to-Face Psychiatry Consult   Reason for Consult:  Consult for this 45 year old woman with multiple severe chronic medical problems as well as chronic major neurologic damage. Concern about recent agitated behaviors. 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 Bipolar 1 disorder, mixed, moderate ([place]) [F31.62] 02/26/2016  . 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 Anemia [D64.9] 02/25/2016    Total Time spent with patient: 25 minutes  Subjective:   Erin [name] is a 45 y.o. female patient admitted with "I was acting out".  Follow-up Saturday the 29th. Patient seen. Chart reviewed. Patient tells me she is feeling much better today. Mood is better. Not having any thoughts about hurting herself. She is able to discuss her behavior at the group home lucidly. She is aware that they are likely to give her 30 day notice but she is already thinking ahead to some plans to try and live independently. No evidence of psychosis today. No evidence of acute depression or dangerousness.  HPI:  Patient interviewed. Chart reviewed. Labs and vitals reviewed. 45 year old woman brought into the hospital with confusion and some behavior problems as well as other medical complaints with possible GI bleed. Patient is not a completely effective historian. She does tell me that she was acting out back at her living facility. She said that she was yelling and agitated. She can't give me a clear reason why. It is hard to pin her down on any descriptions of things. She often veers off into silence or starts repeating herself. She does tell me that she hates her living facility. She's been there about 6 months since her  mother died in 09/30/2023. She says ever since then she has been unhappy and frustrated. Nevertheless she can't give any specific reason why she was having behavior problems recently. She denies that she was having any wish to harm herself or to harm anyone else. She denies that she was banging her head intentionally saying that she simply pumped her head by accident. Patient says that she does not stay constantly depressed. Denies suicidal ideation. Denies homicidal ideation. Denies hallucinations or paranoia or delusions. She says despite being unhappy at her living facility that she does have friends and has some positive things that she enjoys in her life. She is not aware of any changes to her medicine. She did have some medical problems and has multiple severe chronic medical issues.  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 Has chronic headaches with severe chronic pain and is on narcotics and goes to a pain management clinic. Patient is on hemodialysis for end-stage renal disease. 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  Substance abuse history: She is on chronic narcotics and there has been concern expressed occasionally about narcotic misuse but it doesn't sound like she's ever been in any position where she could intentionally abused narcotics. She certainly is chronically dependent on them and has chronic pain behaviors around them but it doesn't seem like they are necessarily being abused.  Social history: Patient lived with her parents until 6 months ago when her mother passed away. He was chronically dependent because of her brain injuries. Sounds like she had a difficult relationship  with her father. She mentions that one of her psychiatric hospitalizations in the past was for threatening to kill her father.  Past Psychiatric History: Sounds like she's had psychotic behaviors and agitated confused behavior is  episodically throughout her life probably at least in part if not entirely due to her brain injury. She tells me she has had 2 previous psychiatric hospitalizations 1 for threatening to kill her self and one for threatening to kill her father but both were years ago. She has never actually tried to kill her self. Recently has no suicidal or aggressive thoughts. She is being treated with chronic antipsychotics including Risperdal and also is on chronic antidepressants Zoloft. Medicines appear to be chronic and stable and tolerated.  Risk to Self: Is patient at risk for suicide?: No Risk to Others:   Prior Inpatient Therapy:   Prior Outpatient Therapy:    Past Medical History:  Past Medical History  Diagnosis Date  . Fibromyalgia   . Chronic headaches     Seeing Pain Management  . Hyperlipidemia   . Psychosis   . Hypothyroid   . Narcolepsy   . Asthma   . Constipation   . Kidney failure     Currently on Dialysis  . Blind   . Epilepsy ([place])   . History of DVT (deep vein thrombosis)   . History of CVA (cerebrovascular accident)   . Depression   . Bipolar affect, depressed ([place])   . Anxiety     Past Surgical History  Procedure Laterality Date  . Brain surgery  1976  . Cholecystectomy  2001  . Breast lumpectomy  2001  . Pituitary excision  1976  . Dg av dialysis graft declot or      X 4  . Peripheral vascular catheterization N/A 08/21/2015    Procedure: Dialysis/Perma Catheter Insertion;  Surgeon: Katha Cabal, MD;  Location: [place] [facility];  Service: Cardiovascular;  Laterality: N/A;   Family History: History reviewed. No pertinent family history. Family Psychiatric  History: She says both her mother and father suffered from depression Social History:  History  Alcohol Use No    Comment: occasionally     History  Drug Use No    Social History   Social History  . Marital Status: Single    Spouse Name: N/A  . Number of Children: N/A  . Years of Education:  N/A   Social History Main Topics  . Smoking status: Current Every Day Smoker -- 0.50 packs/day for 16 years    Types: Cigarettes  . Smokeless tobacco: None  . Alcohol Use: No     Comment: occasionally  . Drug Use: No  . Sexual Activity: Yes   Other Topics Concern  . None   Social History Narrative   Additional Social History:    Allergies:   Allergies  Allergen Reactions  . Dhea [Nutritional Supplements] Anaphylaxis    Patient states medication is DHE for migraines, not DHEA  . Demerol [Meperidine] Other (See Comments)    Reaction:  Hallucinations   . Floxin [Ofloxacin] Other (See Comments)    Reaction:  Hallucinations   . Nsaids Nausea And Vomiting and Swelling  . Nubain [Nalbuphine Hcl] Other (See Comments)    Reaction:  Hallucinations   . Phenergan [Promethazine Hcl] Other (See Comments)    Reaction:  Restless legs   . Stadol [Butorphanol] Other (See Comments)    Reaction:  Hallucinations   . Erythromycin Diarrhea and Rash    Labs:  Results for orders placed or performed during the hospital encounter of 02/25/16 (from the past 48 hour(s))  Hepatitis B surface antigen     Status: None   Collection Time: 02/25/16  7:25 PM  Result Value Ref Range   Hepatitis B Surface Ag Negative Negative    Comment: (NOTE) Performed At: [facility] [place], [place] [id number] Lindon Romp MD FU:[id number]   Hepatitis B surface antibody     Status: None   Collection Time: 02/25/16  7:25 PM  Result Value Ref Range   Hepatitis B-Post 87.8 Immunity>9.9 mIU/mL    Comment: (NOTE)  Status of Immunity                     Anti-HBs Level  ------------------                     -------------- Inconsistent with Immunity                   0.0 - 9.9 Consistent with Immunity                          >9.9 Performed At: [facility] [place], [place] [id number] Lindon Romp MD BJ:[id number]   CBC     Status: Abnormal   Collection  Time: 02/26/16  5:27 AM  Result Value Ref Range   WBC 6.2 3.6 - 11.0 K/uL   RBC 1.80 (L) 3.80 - 5.20 MIL/uL   Hemoglobin 5.6 (L) 12.0 - 16.0 g/dL   HCT 16.3 (L) 35.0 - 47.0 %   MCV 90.8 80.0 - 100.0 fL   MCH 31.1 26.0 - 34.0 pg   MCHC 34.2 32.0 - 36.0 g/dL   RDW 16.0 (H) 11.5 - 14.5 %   Platelets 173 150 - 440 K/uL  MRSA PCR Screening     Status: None   Collection Time: 02/26/16  6:02 AM  Result Value Ref Range   MRSA by PCR NEGATIVE NEGATIVE    Comment:        The GeneXpert MRSA Assay (FDA approved for NASAL specimens only), is one component of a comprehensive MRSA colonization surveillance program. It is not intended to diagnose MRSA infection nor to guide or monitor treatment for MRSA infections.   Hemoglobin and hematocrit, blood     Status: Abnormal   Collection Time: 02/26/16 12:03 PM  Result Value Ref Range   Hemoglobin 6.1 (L) 12.0 - 16.0 g/dL   HCT 18.0 (L) 35.0 - 47.0 %  Prepare RBC     Status: None   Collection Time: 02/26/16 12:30 PM  Result Value Ref Range   Order Confirmation ORDER PROCESSED BY BLOOD BANK   Hemoglobin and hematocrit, blood     Status: Abnormal   Collection Time: 02/26/16  6:17 PM  Result Value Ref Range   Hemoglobin 9.1 (L) 12.0 - 16.0 g/dL   HCT 26.3 (L) 35.0 - 47.0 %  Glucose, capillary     Status: None   Collection Time: 02/26/16 10:25 PM  Result Value Ref Range   Glucose-Capillary 79 65 - 99 mg/dL  Hemoglobin and hematocrit, blood     Status: Abnormal   Collection Time: 02/26/16 11:33 PM  Result Value Ref Range   Hemoglobin 8.7 (L) 12.0 - 16.0 g/dL   HCT 25.8 (L) 35.0 - 47.0 %  Glucose, capillary     Status: Abnormal   Collection Time:  02/27/16 12:16 AM  Result Value Ref Range   Glucose-Capillary 116 (H) 65 - 99 mg/dL  CBC     Status: Abnormal   Collection Time: 02/27/16  6:03 AM  Result Value Ref Range   WBC 6.6 3.6 - 11.0 K/uL   RBC 2.70 (L) 3.80 - 5.20 MIL/uL   Hemoglobin 8.5 (L) 12.0 - 16.0 g/dL   HCT 25.0 (L) 35.0 -  47.0 %   MCV 92.4 80.0 - 100.0 fL   MCH 31.6 26.0 - 34.0 pg   MCHC 34.2 32.0 - 36.0 g/dL   RDW 16.1 (H) 11.5 - 14.5 %   Platelets 196 150 - 440 K/uL  Basic metabolic panel     Status: Abnormal   Collection Time: 02/27/16  6:03 AM  Result Value Ref Range   Sodium 141 135 - 145 mmol/L   Potassium 3.3 (L) 3.5 - 5.1 mmol/L   Chloride 101 101 - 111 mmol/L   CO2 30 22 - 32 mmol/L   Glucose, Bld 79 65 - 99 mg/dL   BUN 12 6 - 20 mg/dL   Creatinine, Ser 2.11 (H) 0.44 - 1.00 mg/dL   Calcium 7.9 (L) 8.9 - 10.3 mg/dL   GFR calc non Af Amer 28 (L) >60 mL/min   GFR calc Af Amer 32 (L) >60 mL/min    Comment: (NOTE) The eGFR has been calculated using the CKD EPI equation. This calculation has not been validated in all clinical situations. eGFR's persistently <60 mL/min signify possible Chronic Kidney Disease.    Anion gap 10 5 - 15  Hemoglobin     Status: Abnormal   Collection Time: 02/27/16 10:16 AM  Result Value Ref Range   Hemoglobin 7.9 (L) 12.0 - 16.0 g/dL    Current Facility-Administered Medications  Medication Dose Route Frequency Provider Last Rate Last Dose  . 0.9 %  sodium chloride infusion   Intravenous Once Fritzi Mandes, MD      . acetaminophen (TYLENOL) tablet 650 mg  650 mg Oral Q6H PRN Fritzi Mandes, MD       Or  . acetaminophen (TYLENOL) suppository 650 mg  650 mg Rectal Q6H PRN Fritzi Mandes, MD      . albuterol (PROVENTIL) (2.5 MG/3ML) 0.083% nebulizer solution 2.5 mg  2.5 mg Nebulization Q6H PRN Fritzi Mandes, MD      . cyclobenzaprine (FLEXERIL) tablet 10 mg  10 mg Oral TID PRN Fritzi Mandes, MD      . Derrill Memo ON 02/29/2016] epoetin alfa (EPOGEN,PROCRIT) injection 10,000 Units  10,000 Units Intravenous Q M,W,F-HD Murlean Iba, MD      . estradiol (ESTRACE) vaginal cream 1 Applicatorful  1 Applicatorful Vaginal Once per day on Mon Thu Fritzi Mandes, MD      . fenofibrate tablet 160 mg  160 mg Oral Daily Fritzi Mandes, MD   160 mg at 02/27/16 1017  . fludrocortisone (FLORINEF) tablet 0.1 mg   0.1 mg Oral Daily Fritzi Mandes, MD   0.1 mg at 02/27/16 1015  . furosemide (LASIX) tablet 40 mg  40 mg Oral BID Fritzi Mandes, MD   40 mg at 02/27/16 0830  . gabapentin (NEURONTIN) capsule 400 mg  400 mg Oral QHS Fritzi Mandes, MD   400 mg at 02/26/16 2133  . guaiFENesin (MUCINEX) 12 hr tablet 600 mg  600 mg Oral BID PRN Fritzi Mandes, MD      . guaifenesin (ROBITUSSIN) 100 MG/5ML syrup 200 mg  200 mg Oral Q6H PRN Fritzi Mandes, MD      .  hydrocortisone (CORTEF) tablet 10 mg  10 mg Oral q morning - 10a Fritzi Mandes, MD   10 mg at 02/27/16 1015  . hydrocortisone (CORTEF) tablet 5 mg  5 mg Oral QHS Fritzi Mandes, MD   5 mg at 02/26/16 2132  . HYDROmorphone (DILAUDID) tablet 6 mg  6 mg Oral Q6H PRN Fritzi Mandes, MD   6 mg at 02/27/16 1509  . hydrOXYzine (ATARAX/VISTARIL) tablet 25 mg  25 mg Oral TID Fritzi Mandes, MD   25 mg at 02/26/16 1418  . levothyroxine (SYNTHROID, LEVOTHROID) tablet 112 mcg  112 mcg Oral QAC breakfast Fritzi Mandes, MD   112 mcg at 02/27/16 0830  . loratadine (CLARITIN) tablet 10 mg  10 mg Oral Daily Fritzi Mandes, MD   10 mg at 02/27/16 1019  . methylphenidate (RITALIN) tablet 20 mg  20 mg Oral Q lunch Fritzi Mandes, MD   20 mg at 02/27/16 1303  . methylphenidate (RITALIN) tablet 30 mg  30 mg Oral QAC breakfast Fritzi Mandes, MD   30 mg at 02/27/16 0831  . mometasone-formoterol (DULERA) 100-5 MCG/ACT inhaler 2 puff  2 puff Inhalation BID Fritzi Mandes, MD   2 puff at 02/26/16 2006  . multivitamin with minerals tablet 1 tablet  1 tablet Oral Daily Fritzi Mandes, MD      . ondansetron (ZOFRAN) tablet 4 mg  4 mg Oral Q6H PRN Fritzi Mandes, MD       Or  . ondansetron (ZOFRAN) injection 4 mg  4 mg Intravenous Q6H PRN Fritzi Mandes, MD   4 mg at 02/27/16 0654  . ondansetron (ZOFRAN-ODT) disintegrating tablet 8 mg  8 mg Oral Q8H PRN Fritzi Mandes, MD      . oxyCODONE (Oxy IR/ROXICODONE) immediate release tablet 10 mg  10 mg Oral Q8H Fritzi Mandes, MD   10 mg at 02/27/16 1427  . polyethylene glycol (MIRALAX / GLYCOLAX) packet 17 g  17 g  Oral Daily PRN Fritzi Mandes, MD      . pravastatin (PRAVACHOL) tablet 40 mg  40 mg Oral q1800 Fritzi Mandes, MD   40 mg at 02/26/16 1808  . risperiDONE (RISPERDAL) tablet 2 mg  2 mg Oral QHS Fritzi Mandes, MD   2 mg at 02/26/16 2130  . risperiDONE (RISPERDAL) tablet 4 mg  4 mg Oral q morning - 10a Fritzi Mandes, MD   4 mg at 02/27/16 1025  . sertraline (ZOLOFT) tablet 50 mg  50 mg Oral Daily Fritzi Mandes, MD   50 mg at 02/27/16 1021  . sevelamer carbonate (RENVELA) tablet 4,000 mg  4,000 mg Oral TID WC Fritzi Mandes, MD   4,000 mg at 02/27/16 1302  . topiramate (TOPAMAX) tablet 200 mg  200 mg Oral BID Fritzi Mandes, MD   200 mg at 02/27/16 1022  . topiramate (TOPAMAX) tablet 50 mg  50 mg Oral BID Fritzi Mandes, MD   50 mg at 02/27/16 1023  . Vitamin D (Ergocalciferol) (DRISDOL) capsule 50,000 Units  50,000 Units Oral Q30 days Fritzi Mandes, MD        Musculoskeletal: Strength & Muscle Tone: decreased Gait & Station: unable to stand Patient leans: N/A  Psychiatric Specialty Exam: Review of Systems  Constitutional: Negative.   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  Respiratory: Negative.   Cardiovascular: Negative.   Gastrointestinal: Negative.   Musculoskeletal: Negative.   Skin: Negative.   Neurological: Negative for headaches.  Psychiatric/Behavioral: Negative for depression, suicidal ideas, hallucinations,  memory loss and substance abuse. The patient is not nervous/anxious and does not have insomnia.     Blood pressure 158/68, pulse 77, temperature 98.2 F (36.8 C), temperature source Oral, resp. rate 17, height _0  (1.473 m), weight 84.5 kg (186 lb 4.6 oz), last menstrual period 11/21/2007, SpO2 99 %.Body mass index is 38.94 kg/(m^2).  General Appearance: Casual  Eye Contact::  None  Speech:  Garbled and Slow  Volume:  Decreased  Mood:  Euthymic  Affect:  Appropriate  Thought Process:  Negative  Orientation:  Full (Time, Place, and Person)  Thought Content:  Negative  Suicidal  Thoughts:  No  Homicidal Thoughts:  No  Memory:  Immediate;   Good Recent;   Fair Remote;   Fair  Judgement:  Fair  Insight:  Fair  Psychomotor Activity:  Decreased  Concentration:  Fair  Recall:  [employer] of Knowledge:Fair  Language: Fair  Akathisia:  No  Handed:  Right  AIMS (if indicated):     Assets:  Financial Resources/Insurance Housing Resilience  ADL's:  Impaired  Cognition: Impaired,  Mild  Sleep:      Treatment Plan Summary: Plan Patient was much more appropriately interactive today. Has no new complaints. Says she is feeling better. No problems today with insight. After reviewing her medicines yesterday there did not appear to be any indication to make a change to anything. No change further to medicine today. I will follow-up as needed.  Disposition: Patient does not meet criteria for psychiatric inpatient admission. Supportive therapy provided about ongoing stressors.  Alethia Berthold, MD 02/27/2016 4:20 PM

## 2016-02-28 LAB — CBC
HEMATOCRIT: 24.3 % — AB (ref 35.0–47.0)
HEMOGLOBIN: 8.1 g/dL — AB (ref 12.0–16.0)
MCH: 31.7 pg (ref 26.0–34.0)
MCHC: 33.2 g/dL (ref 32.0–36.0)
MCV: 95.6 fL (ref 80.0–100.0)
Platelets: 182 10*3/uL (ref 150–440)
RBC: 2.54 MIL/uL — ABNORMAL LOW (ref 3.80–5.20)
RDW: 16.4 % — ABNORMAL HIGH (ref 11.5–14.5)
WBC: 6.6 10*3/uL (ref 3.6–11.0)

## 2016-02-28 MED ORDER — POLYETHYLENE GLYCOL 3350 17 GM/SCOOP PO POWD
0.5000 | Freq: Once | ORAL | Status: DC
  Filled 2016-02-28: qty 255

## 2016-02-28 MED ORDER — SIMETHICONE 80 MG PO CHEW
80.0000 mg | CHEWABLE_TABLET | Freq: Four times a day (QID) | ORAL | Status: AC
  Administered 2016-02-28: 80 mg via ORAL
  Filled 2016-02-28 (×2): qty 1

## 2016-02-28 MED ORDER — CHLORHEXIDINE GLUCONATE CLOTH 2 % EX PADS
6.0000 | MEDICATED_PAD | Freq: Once | CUTANEOUS | Status: AC
  Administered 2016-02-28: 6 via TOPICAL

## 2016-02-28 NOTE — Progress Notes (Signed)
[facility]  Daily Progress Note   Subjective  - * No surgery date entered *  Patient now agreeing to consider PermCath removal by our service. We will consult Friday at which time she refused. After discussions with nephrology, she is agreeable to having this done tomorrow in special procedures. In talking with her today, she is a terrible historian with very interesting issues as to why her PermCath was not already been removed. She is very nervous and frightful about its removal. No major issues overnight in the hospital. Afebrile. Was scheduled to have this taken out at [facility] later this week, but now interested in having it removed here  Objective Filed Vitals:   02/27/16 2007 02/27/16 2232 02/28/16 0626 02/28/16 1300  BP: 135/66 140/60 158/76 145/75  Pulse: 73 68 74 75  Temp: 98.1 F (36.7 C) 97.9 F (36.6 C) 98 F (36.7 C) 97.6 F (36.4 C)  TempSrc: Oral Oral Oral Oral  Resp: 18  16 20   Height:      Weight:      SpO2: 97% 96% 97% 98%    Intake/Output Summary (Last 24 hours) at 02/28/16 1800 Last data filed at 02/28/16 1742  Gross per 24 hour  Intake    680 ml  Output      0 ml  Net    680 ml    PULM  CTAB CV  RRR VASC  PermCath in left chest with mild surrounding erythema  Laboratory CBC    Component Value Date/Time   WBC 6.6 02/28/2016 0537   WBC 15.9* 11/25/2014 1200   HGB 8.1* 02/28/2016 0537   HGB 12.3 11/25/2014 1200   HCT 24.3* 02/28/2016 0537   HCT 37.5 11/25/2014 1200   PLT 182 02/28/2016 0537   PLT 230 11/25/2014 1200    BMET    Component Value Date/Time   NA 141 02/27/2016 0603   NA 140 11/25/2014 1200   K 3.3* 02/27/2016 0603   K 3.7 11/25/2014 1200   CL 101 02/27/2016 0603   CL 100 11/25/2014 1200   CO2 30 02/27/2016 0603   CO2 25 11/25/2014 1200   GLUCOSE 79 02/27/2016 0603   GLUCOSE 104* 11/25/2014 1200   BUN 12 02/27/2016 0603   BUN 56* 11/25/2014 1200   CREATININE 2.11* 02/27/2016 0603   CREATININE  6.17* 11/25/2014 1200   CALCIUM 7.9* 02/27/2016 0603   CALCIUM 7.6* 11/25/2014 1200   GFRNONAA 28* 02/27/2016 0603   GFRNONAA 8* 11/25/2014 1200   GFRNONAA 9* 11/15/2013 2000   GFRAA 32* 02/27/2016 0603   GFRAA 10* 11/25/2014 1200   GFRAA 11* 11/15/2013 2000    Assessment/Planning:    End-stage renal disease with complication of dialysis access. Needs to have her PermCath removed and this was previously scheduled at [facility] but as she is already in the hospital she would like to have this out here now.  Risks and benefits of PermCath removal were discussed with the patient. She would not get general anesthesia for this and at most a minimal sedative and this is usually done with no sedation and only a local anesthetic. We'll schedule for tomorrow.    Kenndra Morris  02/28/2016, 6:00 PM

## 2016-02-28 NOTE — Progress Notes (Signed)
[facility] [place] [facility]   PATIENT NAME: Erin [name]    MR#:  [id number]  DATE OF BIRTH:  May 04, 1971  SUBJECTIVE:  No acute issues  REVIEW OF SYSTEMS:    Review of Systems  Constitutional: Negative for fever, chills and malaise/fatigue.  HENT: Negative for ear discharge, ear pain, hearing loss, nosebleeds and sore throat.   Eyes: Negative for blurred vision and pain.  Respiratory: Negative for cough, hemoptysis, shortness of breath and wheezing.   Cardiovascular: Negative for chest pain, palpitations and leg swelling.  Gastrointestinal: Negative for nausea, vomiting, abdominal pain, diarrhea and blood in stool.  Genitourinary: Negative for dysuria.  Musculoskeletal: Negative for back pain.  Neurological: Negative for dizziness, tremors, speech change, focal weakness, seizures and headaches.  Endo/Heme/Allergies: Does not bruise/bleed easily.  Psychiatric/Behavioral: Negative for depression, suicidal ideas and hallucinations.    Tolerating Diet:yes      DRUG ALLERGIES:   Allergies  Allergen Reactions  . Dhea [Nutritional Supplements] Anaphylaxis    Patient states medication is DHE for migraines, not DHEA  . Demerol [Meperidine] Other (See Comments)    Reaction:  Hallucinations   . Floxin [Ofloxacin] Other (See Comments)    Reaction:  Hallucinations   . Nsaids Nausea And Vomiting and Swelling  . Nubain [Nalbuphine Hcl] Other (See Comments)    Reaction:  Hallucinations   . Phenergan [Promethazine Hcl] Other (See Comments)    Reaction:  Restless legs   . Stadol [Butorphanol] Other (See Comments)    Reaction:  Hallucinations   . Erythromycin Diarrhea and Rash    VITALS:  Blood pressure 158/76, pulse 74, temperature 98 F (36.7 C), temperature source Oral, resp. rate 16, height 4\' 10"  (1.473 m), weight 84.5 kg (186 lb 4.6 oz), last menstrual period 11/21/2007, SpO2 97 %.  PHYSICAL EXAMINATION:   Physical Exam  Constitutional: She is  oriented to person, place, and time and well-developed, well-nourished, and in no distress. No distress.  HENT:  Head: Normocephalic.  Eyes: No scleral icterus.  Neck: Normal range of motion. Neck supple. No JVD present. No tracheal deviation present.  Cardiovascular: Normal rate, regular rhythm and normal heart sounds.  Exam reveals no gallop and no friction rub.   No murmur heard. Pulmonary/Chest: Effort normal and breath sounds normal. No respiratory distress. She has no wheezes. She has no rales. She exhibits no tenderness.  Abdominal: Soft. Bowel sounds are normal. She exhibits no distension and no mass. There is no tenderness. There is no rebound and no guarding.  Musculoskeletal: Normal range of motion. She exhibits no edema.  Neurological: She is alert and oriented to person, place, and time.  Skin: Skin is warm. No rash noted. No erythema.  Psychiatric: Affect and judgment normal.      LABORATORY PANEL:   CBC  Recent Labs Lab 02/28/16 0537  WBC 6.6  HGB 8.1*  HCT 24.3*  PLT 182   ------------------------------------------------------------------------------------------------------------------  Chemistries   Recent Labs Lab 02/27/16 0603  NA 141  K 3.3*  CL 101  CO2 30  GLUCOSE 79  BUN 12  CREATININE 2.11*  CALCIUM 7.9*   ------------------------------------------------------------------------------------------------------------------  Cardiac Enzymes  Recent Labs Lab 02/25/16 1218  TROPONINI 0.05*   ------------------------------------------------------------------------------------------------------------------  RADIOLOGY:  No results found.   ASSESSMENT AND PLAN:   45 y.o. 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  1. Acute on chronic anemia likely due to slow GI bleed versus anemia of chronic disease. She received 3 units of PRBC.  She also received EPO during dialysis. She had an unremarkable upper GI and colonoscopy in August 2016 at [facility]. Plan for capsule endoscopy tomorrow. Dw Dr Shelle Iron. Hgb 8.1 this am no need for transfusion.  2. End-stage renal disease on hemodialysis: She will continue HD on her regular outpatient days. Permacath to be removed tomorrow.  3. Fibromyalgia, history of narcotic abuse, chronic pain syndrome Continue patient's home meds Patient goes to pain clinic in [facility]  4. History of seizures: She will continue her medications.  5. Bipolar/depression: She was evaluated by Psychiatrist here and Dr Toni Amend did not recommend changing her medications. She did not meet inpatient Psychiatric criteria and is at her baseline.   6. Hyperlipidemia; She will continue Lovastatin       Management plans discussed with the patient and she is in agreement.  CODE STATUS: FULL  TOTAL TIME TAKING CARE OF THIS PATIENT: 25 minutes.     POSSIBLE D/C tomorrow, DEPENDING ON CLINICAL CONDITION.   Elena Davia M.D on 02/28/2016 at 10:27 AM  Between 7am to 6pm - Pager - [phone] After 6pm go to [email or web address] - password [email or web address]  [facility]  Office  [phone]  CC: Primary care physician; Pcp Not In System  Note: This dictation was prepared with Dragon dictation along with smaller phrase technology. Any transcriptional errors that result from this process are unintentional.

## 2016-02-28 NOTE — Progress Notes (Signed)
Subjective:  Patient known to our practice from outpatient dialysis. Patient presented to ER for right calf pain Admitted for low hemoglobin and confusion. Oriented now Suspected GI bleed. Hgb 5.1. Given multiple units of PRBC   Capsule endoscopy planned  Patient refused permcath removal on Friday at bedside. Discussed with Dr Erin [name] - can be arranged for removal in specials on Monday Denies acute c/o today   Objective:  Vital signs in last 24 hours:  Temp:  [97.6 F (36.4 C)-98.1 F (36.7 C)] 97.6 F (36.4 C) (04/30 1300) Pulse Rate:  [68-75] 75 (04/30 1300) Resp:  [16-20] 20 (04/30 1300) BP: (135-158)/(60-76) 145/75 mmHg (04/30 1300) SpO2:  [96 %-98 %] 98 % (04/30 1300)  Weight change:  Filed Weights   02/25/16 1755 02/26/16 1100 02/26/16 1415  Weight: 84.5 kg (186 lb 4.6 oz) 84.5 kg (186 lb 4.6 oz) 84.5 kg (186 lb 4.6 oz)    Intake/Output:    Intake/Output Summary (Last 24 hours) at 02/28/16 1408 Last data filed at 02/28/16 0900  Gross per 24 hour  Intake    780 ml  Output      0 ml  Net    780 ml     Physical Exam: General: NAD, laying in bed  HEENT Anicteric, moist mucus membranes  Neck supple  Pulm/lungs Clear, room air, normal effort  CVS/Heart Regular, no rub, harsh systolic murmur  Abdomen:  Soft, non tender  Extremities: trace peripheral edema  Neurologic: Alert, able to follow commands`  Skin: No acute rashes  Access: Left IJ permcath, Right arm AV graft       Basic Metabolic Panel:   Recent Labs Lab 02/25/16 1218 02/27/16 0603  NA 135 141  K 3.5 3.3*  CL 99* 101  CO2 25 30  GLUCOSE 73 79  BUN 51* 12  CREATININE 4.73* 2.11*  CALCIUM 7.8* 7.9*     CBC:  Recent Labs Lab 02/25/16 1218 02/26/16 0527 02/26/16 1203 02/26/16 1817 02/26/16 2333 02/27/16 0603 02/27/16 1016 02/28/16 0537  WBC 11.1* 6.2  --   --   --  6.6  --  6.6  HGB 5.1* 5.6* 6.1* 9.1* 8.7* 8.5* 7.9* 8.1*  HCT 15.6* 16.3* 18.0* 26.3* 25.8* 25.0*  --  24.3*  MCV  91.7 90.8  --   --   --  92.4  --  95.6  PLT 232 173  --   --   --  196  --  182      Microbiology:  Recent Results (from the past 720 hour(s))  MRSA PCR Screening     Status: None   Collection Time: 02/26/16  6:02 AM  Result Value Ref Range Status   MRSA by PCR NEGATIVE NEGATIVE Final    Comment:        The GeneXpert MRSA Assay (FDA approved for NASAL specimens only), is one component of a comprehensive MRSA colonization surveillance program. It is not intended to diagnose MRSA infection nor to guide or monitor treatment for MRSA infections.     Coagulation Studies: No results for input(s): LABPROT, INR in the last 72 hours.  Urinalysis: No results for input(s): COLORURINE, LABSPEC, PHURINE, GLUCOSEU, HGBUR, BILIRUBINUR, KETONESUR, PROTEINUR, UROBILINOGEN, NITRITE, LEUKOCYTESUR in the last 72 hours.  Invalid input(s): APPERANCEUR    Imaging: No results found.   Medications:     . sodium chloride   Intravenous Once  . [START ON 02/29/2016] epoetin (EPOGEN/PROCRIT) injection  10,000 Units Intravenous Q M,W,F-HD  . estradiol  1 Applicatorful Vaginal Once per day on Mon Thu  . fenofibrate  160 mg Oral Daily  . fludrocortisone  0.1 mg Oral Daily  . furosemide  40 mg Oral BID  . gabapentin  400 mg Oral QHS  . hydrocortisone  10 mg Oral q morning - 10a  . hydrocortisone  5 mg Oral QHS  . hydrOXYzine  25 mg Oral TID  . levothyroxine  112 mcg Oral QAC breakfast  . loratadine  10 mg Oral Daily  . methylphenidate  20 mg Oral Q lunch  . methylphenidate  30 mg Oral QAC breakfast  . mometasone-formoterol  2 puff Inhalation BID  . multivitamin with minerals  1 tablet Oral Daily  . oxyCODONE  10 mg Oral Q8H  . pravastatin  40 mg Oral q1800  . risperiDONE  2 mg Oral QHS  . risperidone  4 mg Oral q morning - 10a  . sertraline  50 mg Oral Daily  . sevelamer carbonate  4,000 mg Oral TID WC  . topiramate  200 mg Oral BID  . topiramate  50 mg Oral BID  . Vitamin D  (Ergocalciferol)  50,000 Units Oral Q30 days   acetaminophen **OR** acetaminophen, albuterol, cyclobenzaprine, guaiFENesin, guaifenesin, HYDROmorphone, ondansetron **OR** ondansetron (ZOFRAN) IV, ondansetron, polyethylene glycol  Assessment/ Plan:  45 y.o. 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 ESRD- Axis Erin [name]/ MWF-2/ [facility] - Dialysis while in the hospital - Now that AV graft is working, we will request vascular to remove the PermCath - patient refused bedside removal. Discussed with Dr Erin [name]. Plan for removal in specials on monday  2. AoCKD and blood loss - GI bleed suspected Work up in progress; blood transfusion given 4/27 - iv procrit with HD  - capsule endoscopy planned  3. SHPTH - sevelamer with meals     LOS: 3 Erin [name] 4/30/20172:08 PM

## 2016-02-28 NOTE — Consult Note (Signed)
[facility] Face-to-Face Psychiatry Consult   Reason for Consult:  Consult for this 45 year old woman with multiple severe chronic medical problems as well as chronic major neurologic damage. Concern about recent agitated behaviors. 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 Bipolar 1 disorder, mixed, moderate ([place]) [F31.62] 02/26/2016  . 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 Anemia [D64.9] 02/25/2016    Total Time spent with patient: 25 minutes  Subjective:   Erin [name] is a 45 y.o. female patient admitted with "I was acting out".  Follow-up Sunday the 30th. No new complaints. Patient says that her mood has been stable. She is feeling a little bit tired today but nothing else different from yesterday. She has not engaged in any dangerous behavior. Denies suicidal ideation. Denies hallucinations. Seems to be tolerating her medicine well and back to her baseline mental state. She is enjoying getting visits from friends. Perry appropriate in discussing her care. Appears to have good insight.    HPI:  Patient interviewed. Chart reviewed. Labs and vitals reviewed. 45 year old woman brought into the hospital with confusion and some behavior problems as well as other medical complaints with possible GI bleed. Patient is not a completely effective historian. She does tell me that she was acting out back at her living facility. She said that she was yelling and agitated. She can't give me a clear reason why. It is hard to pin her down on any descriptions of things. She often veers off into silence or starts repeating herself. She does tell me that she hates her living facility. She's been there about 6 months  since her mother died in 2023/09/26. She says ever since then she has been unhappy and frustrated. Nevertheless she can't give any specific reason why she was having behavior problems recently. She denies that she was having any wish to harm herself or to harm anyone else. She denies that she was banging her head intentionally saying that she simply pumped her head by accident. Patient says that she does not stay constantly depressed. Denies suicidal ideation. Denies homicidal ideation. Denies hallucinations or paranoia or delusions. She says despite being unhappy at her living facility that she does have friends and has some positive things that she enjoys in her life. She is not aware of any changes to her medicine. She did have some medical problems and has multiple severe chronic medical issues.  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 Has chronic headaches with severe chronic pain and is on narcotics and goes to a pain management clinic. Patient is on hemodialysis for end-stage renal disease. 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  Substance abuse history: She is on chronic narcotics and there has been concern expressed occasionally about narcotic misuse but it doesn't sound like she's ever been in any position where she could intentionally abused narcotics. She certainly is chronically dependent on them and has chronic pain behaviors around them but it doesn't seem like they are necessarily being abused.  Social history: Patient lived with her parents until 6 months ago when her mother passed away. He was chronically dependent because of her brain injuries. Sounds like she had a difficult relationship with her  father. She mentions that one of her psychiatric hospitalizations in the past was for threatening to kill her father.  Past Psychiatric History: Sounds like she's had psychotic behaviors and agitated confused  behavior is episodically throughout her life probably at least in part if not entirely due to her brain injury. She tells me she has had 2 previous psychiatric hospitalizations 1 for threatening to kill her self and one for threatening to kill her father but both were years ago. She has never actually tried to kill her self. Recently has no suicidal or aggressive thoughts. She is being treated with chronic antipsychotics including Risperdal and also is on chronic antidepressants Zoloft. Medicines appear to be chronic and stable and tolerated.  Risk to Self: Is patient at risk for suicide?: No Risk to Others:   Prior Inpatient Therapy:   Prior Outpatient Therapy:    Past Medical History:  Past Medical History  Diagnosis Date  . Fibromyalgia   . Chronic headaches     Seeing Pain Management  . Hyperlipidemia   . Psychosis   . Hypothyroid   . Narcolepsy   . Asthma   . Constipation   . Kidney failure     Currently on Dialysis  . Blind   . Epilepsy ([place])   . History of DVT (deep vein thrombosis)   . History of CVA (cerebrovascular accident)   . Depression   . Bipolar affect, depressed ([place])   . Anxiety     Past Surgical History  Procedure Laterality Date  . Brain surgery  1976  . Cholecystectomy  2001  . Breast lumpectomy  2001  . Pituitary excision  1976  . Dg av dialysis graft declot or      X 4  . Peripheral vascular catheterization N/A 08/21/2015    Procedure: Dialysis/Perma Catheter Insertion;  Surgeon: Katha Cabal, MD;  Location: [facility];  Service: Cardiovascular;  Laterality: N/A;   Family History: History reviewed. No pertinent family history. Family Psychiatric  History: She says both her mother and father suffered from depression Social History:  History  Alcohol Use No    Comment: occasionally     History  Drug Use No    Social History   Social History  . Marital Status: Single    Spouse Name: N/A  . Number of Children: N/A  . Years of  Education: N/A   Social History Main Topics  . Smoking status: Current Every Day Smoker -- 0.50 packs/day for 16 years    Types: Cigarettes  . Smokeless tobacco: None  . Alcohol Use: No     Comment: occasionally  . Drug Use: No  . Sexual Activity: Yes   Other Topics Concern  . None   Social History Narrative   Additional Social History:    Allergies:   Allergies  Allergen Reactions  . Dhea [Nutritional Supplements] Anaphylaxis    Patient states medication is DHE for migraines, not DHEA  . Demerol [Meperidine] Other (See Comments)    Reaction:  Hallucinations   . Floxin [Ofloxacin] Other (See Comments)    Reaction:  Hallucinations   . Nsaids Nausea And Vomiting and Swelling  . Nubain [Nalbuphine Hcl] Other (See Comments)    Reaction:  Hallucinations   . Phenergan [Promethazine Hcl] Other (See Comments)    Reaction:  Restless legs   . Stadol [Butorphanol] Other (See Comments)    Reaction:  Hallucinations   . Erythromycin Diarrhea and Rash    Labs:  Results  for orders placed or performed during the hospital encounter of 02/25/16 (from the past 48 hour(s))  Hemoglobin and hematocrit, blood     Status: Abnormal   Collection Time: 02/26/16  6:17 PM  Result Value Ref Range   Hemoglobin 9.1 (L) 12.0 - 16.0 g/dL   HCT 26.3 (L) 35.0 - 47.0 %  Glucose, capillary     Status: None   Collection Time: 02/26/16 10:25 PM  Result Value Ref Range   Glucose-Capillary 79 65 - 99 mg/dL  Hemoglobin and hematocrit, blood     Status: Abnormal   Collection Time: 02/26/16 11:33 PM  Result Value Ref Range   Hemoglobin 8.7 (L) 12.0 - 16.0 g/dL   HCT 25.8 (L) 35.0 - 47.0 %  Glucose, capillary     Status: Abnormal   Collection Time: 02/27/16 12:16 AM  Result Value Ref Range   Glucose-Capillary 116 (H) 65 - 99 mg/dL  CBC     Status: Abnormal   Collection Time: 02/27/16  6:03 AM  Result Value Ref Range   WBC 6.6 3.6 - 11.0 K/uL   RBC 2.70 (L) 3.80 - 5.20 MIL/uL   Hemoglobin 8.5 (L) 12.0  - 16.0 g/dL   HCT 25.0 (L) 35.0 - 47.0 %   MCV 92.4 80.0 - 100.0 fL   MCH 31.6 26.0 - 34.0 pg   MCHC 34.2 32.0 - 36.0 g/dL   RDW 16.1 (H) 11.5 - 14.5 %   Platelets 196 150 - 440 K/uL  Basic metabolic panel     Status: Abnormal   Collection Time: 02/27/16  6:03 AM  Result Value Ref Range   Sodium 141 135 - 145 mmol/L   Potassium 3.3 (L) 3.5 - 5.1 mmol/L   Chloride 101 101 - 111 mmol/L   CO2 30 22 - 32 mmol/L   Glucose, Bld 79 65 - 99 mg/dL   BUN 12 6 - 20 mg/dL   Creatinine, Ser 2.11 (H) 0.44 - 1.00 mg/dL   Calcium 7.9 (L) 8.9 - 10.3 mg/dL   GFR calc non Af Amer 28 (L) >60 mL/min   GFR calc Af Amer 32 (L) >60 mL/min    Comment: (NOTE) The eGFR has been calculated using the CKD EPI equation. This calculation has not been validated in all clinical situations. eGFR's persistently <60 mL/min signify possible Chronic Kidney Disease.    Anion gap 10 5 - 15  Hemoglobin     Status: Abnormal   Collection Time: 02/27/16 10:16 AM  Result Value Ref Range   Hemoglobin 7.9 (L) 12.0 - 16.0 g/dL  CBC     Status: Abnormal   Collection Time: 02/28/16  5:37 AM  Result Value Ref Range   WBC 6.6 3.6 - 11.0 K/uL   RBC 2.54 (L) 3.80 - 5.20 MIL/uL   Hemoglobin 8.1 (L) 12.0 - 16.0 g/dL   HCT 24.3 (L) 35.0 - 47.0 %   MCV 95.6 80.0 - 100.0 fL   MCH 31.7 26.0 - 34.0 pg   MCHC 33.2 32.0 - 36.0 g/dL   RDW 16.4 (H) 11.5 - 14.5 %   Platelets 182 150 - 440 K/uL    Current Facility-Administered Medications  Medication Dose Route Frequency Provider Last Rate Last Dose  . 0.9 %  sodium chloride infusion   Intravenous Once Fritzi Mandes, MD      . acetaminophen (TYLENOL) tablet 650 mg  650 mg Oral Q6H PRN Fritzi Mandes, MD       Or  . acetaminophen (TYLENOL)  suppository 650 mg  650 mg Rectal Q6H PRN Fritzi Mandes, MD      . albuterol (PROVENTIL) (2.5 MG/3ML) 0.083% nebulizer solution 2.5 mg  2.5 mg Nebulization Q6H PRN Fritzi Mandes, MD      . cyclobenzaprine (FLEXERIL) tablet 10 mg  10 mg Oral TID PRN Fritzi Mandes,  MD   10 mg at 02/28/16 1044  . [START ON 02/29/2016] epoetin alfa (EPOGEN,PROCRIT) injection 10,000 Units  10,000 Units Intravenous Q M,W,F-HD Harmeet Singh, MD      . estradiol (ESTRACE) vaginal cream 1 Applicatorful  1 Applicatorful Vaginal Once per day on Mon Thu Fritzi Mandes, MD      . fenofibrate tablet 160 mg  160 mg Oral Daily Fritzi Mandes, MD   160 mg at 02/28/16 1049  . fludrocortisone (FLORINEF) tablet 0.1 mg  0.1 mg Oral Daily Fritzi Mandes, MD   0.1 mg at 02/28/16 1056  . furosemide (LASIX) tablet 40 mg  40 mg Oral BID Fritzi Mandes, MD   40 mg at 02/28/16 1052  . gabapentin (NEURONTIN) capsule 400 mg  400 mg Oral QHS Fritzi Mandes, MD   400 mg at 02/27/16 2005  . guaiFENesin (MUCINEX) 12 hr tablet 600 mg  600 mg Oral BID PRN Fritzi Mandes, MD      . guaifenesin (ROBITUSSIN) 100 MG/5ML syrup 200 mg  200 mg Oral Q6H PRN Fritzi Mandes, MD      . hydrocortisone (CORTEF) tablet 10 mg  10 mg Oral q morning - 10a Fritzi Mandes, MD   10 mg at 02/28/16 1055  . hydrocortisone (CORTEF) tablet 5 mg  5 mg Oral QHS Fritzi Mandes, MD   5 mg at 02/27/16 2005  . HYDROmorphone (DILAUDID) tablet 6 mg  6 mg Oral Q6H PRN Fritzi Mandes, MD   6 mg at 02/28/16 1548  . hydrOXYzine (ATARAX/VISTARIL) tablet 25 mg  25 mg Oral TID Fritzi Mandes, MD   25 mg at 02/28/16 1053  . levothyroxine (SYNTHROID, LEVOTHROID) tablet 112 mcg  112 mcg Oral QAC breakfast Fritzi Mandes, MD   112 mcg at 02/28/16 1050  . loratadine (CLARITIN) tablet 10 mg  10 mg Oral Daily Fritzi Mandes, MD   10 mg at 02/28/16 1052  . methylphenidate (RITALIN) tablet 20 mg  20 mg Oral Q lunch Fritzi Mandes, MD   20 mg at 02/27/16 1303  . methylphenidate (RITALIN) tablet 30 mg  30 mg Oral QAC breakfast Fritzi Mandes, MD   30 mg at 02/28/16 1051  . mometasone-formoterol (DULERA) 100-5 MCG/ACT inhaler 2 puff  2 puff Inhalation BID Fritzi Mandes, MD   2 puff at 02/27/16 2007  . multivitamin with minerals tablet 1 tablet  1 tablet Oral Daily Fritzi Mandes, MD      . ondansetron (ZOFRAN) tablet 4 mg  4  mg Oral Q6H PRN Fritzi Mandes, MD       Or  . ondansetron (ZOFRAN) injection 4 mg  4 mg Intravenous Q6H PRN Fritzi Mandes, MD   4 mg at 02/27/16 0654  . ondansetron (ZOFRAN-ODT) disintegrating tablet 8 mg  8 mg Oral Q8H PRN Fritzi Mandes, MD      . oxyCODONE (Oxy IR/ROXICODONE) immediate release tablet 10 mg  10 mg Oral Q8H Fritzi Mandes, MD   10 mg at 02/27/16 2006  . polyethylene glycol (MIRALAX / GLYCOLAX) packet 17 g  17 g Oral Daily PRN Fritzi Mandes, MD      . polyethylene glycol powder (GLYCOLAX/MIRALAX) container 127.5 g  0.5 Container  Oral Once Josefine Class, MD      . pravastatin (PRAVACHOL) tablet 40 mg  40 mg Oral q1800 Fritzi Mandes, MD   40 mg at 02/27/16 1700  . risperiDONE (RISPERDAL) tablet 2 mg  2 mg Oral QHS Fritzi Mandes, MD   2 mg at 02/27/16 2005  . risperiDONE (RISPERDAL) tablet 4 mg  4 mg Oral q morning - 10a Fritzi Mandes, MD   4 mg at 02/28/16 1050  . sertraline (ZOLOFT) tablet 50 mg  50 mg Oral Daily Fritzi Mandes, MD   50 mg at 02/28/16 1055  . sevelamer carbonate (RENVELA) tablet 4,000 mg  4,000 mg Oral TID WC Fritzi Mandes, MD   4,000 mg at 02/28/16 1057  . simethicone (MYLICON) chewable tablet 80 mg  80 mg Oral QID Josefine Class, MD      . topiramate (TOPAMAX) tablet 200 mg  200 mg Oral BID Fritzi Mandes, MD   200 mg at 02/28/16 1054  . topiramate (TOPAMAX) tablet 50 mg  50 mg Oral BID Fritzi Mandes, MD   50 mg at 02/28/16 1057  . Vitamin D (Ergocalciferol) (DRISDOL) capsule 50,000 Units  50,000 Units Oral Q30 days Fritzi Mandes, MD        Musculoskeletal: Strength & Muscle Tone: decreased Gait & Station: unable to stand Patient leans: N/A  Psychiatric Specialty Exam: Review of Systems  Constitutional: Negative.   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  Respiratory: Negative.   Cardiovascular: Negative.   Gastrointestinal: Negative.   Musculoskeletal: Negative.   Skin: Negative.   Neurological: Negative for headaches.  Psychiatric/Behavioral: Negative for  depression, suicidal ideas, hallucinations, memory loss and substance abuse. The patient is not nervous/anxious and does not have insomnia.     Blood pressure 145/75, pulse 75, temperature 97.6 F (36.4 C), temperature source Oral, resp. rate 20, height 4' 10"  (1.473 m), weight 84.5 kg (186 lb 4.6 oz), last menstrual period 11/21/2007, SpO2 98 %.Body mass index is 38.94 kg/(m^2).  General Appearance: Casual  Eye Contact::  None  Speech:  Garbled and Slow  Volume:  Decreased  Mood:  Euthymic  Affect:  Appropriate  Thought Process:  Negative  Orientation:  Full (Time, Place, and Person)  Thought Content:  Negative  Suicidal Thoughts:  No  Homicidal Thoughts:  No  Memory:  Immediate;   Good Recent;   Fair Remote;   Fair  Judgement:  Fair  Insight:  Fair  Psychomotor Activity:  Decreased  Concentration:  Fair  Recall:  [employer] of Knowledge:Fair  Language: Fair  Akathisia:  No  Handed:  Right  AIMS (if indicated):     Assets:  Financial Resources/Insurance Housing Resilience  ADL's:  Impaired  Cognition: Impaired,  Mild  Sleep:      Treatment Plan Summary: Plan Patient continues to be very appropriate. No further problem behaviors. No evidence of acute psychosis. No indication to change any of her medicine. She is reporting feeling comfortable with going home when she is medically stable. I will follow up just as needed at this point.  Disposition: Patient does not meet criteria for psychiatric inpatient admission. Supportive therapy provided about ongoing stressors.  Alethia Berthold, MD 02/28/2016 4:30 PM

## 2016-02-28 NOTE — Progress Notes (Signed)
GI Inpatient Follow-up Note  Patient Identification: Erin [name] is a 45 y.o. female with severe anemia, obscure overt GI bleeding  Subjective:  No more bleeding. Hgb stable.  No abd pain, n/v, f/c.  Now will to do drink prep for capsule endo.   Scheduled Inpatient Medications:  . sodium chloride   Intravenous Once  . Chlorhexidine Gluconate Cloth  6 each Topical Once  . [START ON 02/29/2016] epoetin (EPOGEN/PROCRIT) injection  10,000 Units Intravenous Q M,W,F-HD  . estradiol  1 Applicatorful Vaginal Once per day on Mon Thu  . fenofibrate  160 mg Oral Daily  . fludrocortisone  0.1 mg Oral Daily  . furosemide  40 mg Oral BID  . gabapentin  400 mg Oral QHS  . hydrocortisone  10 mg Oral q morning - 10a  . hydrocortisone  5 mg Oral QHS  . hydrOXYzine  25 mg Oral TID  . levothyroxine  112 mcg Oral QAC breakfast  . loratadine  10 mg Oral Daily  . methylphenidate  20 mg Oral Q lunch  . methylphenidate  30 mg Oral QAC breakfast  . mometasone-formoterol  2 puff Inhalation BID  . multivitamin with minerals  1 tablet Oral Daily  . oxyCODONE  10 mg Oral Q8H  . polyethylene glycol powder  0.5 Container Oral Once  . pravastatin  40 mg Oral q1800  . risperiDONE  2 mg Oral QHS  . risperidone  4 mg Oral q morning - 10a  . sertraline  50 mg Oral Daily  . sevelamer carbonate  4,000 mg Oral TID WC  . simethicone  80 mg Oral QID  . topiramate  200 mg Oral BID  . topiramate  50 mg Oral BID  . Vitamin D (Ergocalciferol)  50,000 Units Oral Q30 days    Continuous Inpatient Infusions:     PRN Inpatient Medications:  acetaminophen **OR** acetaminophen, albuterol, cyclobenzaprine, guaiFENesin, guaifenesin, HYDROmorphone, ondansetron **OR** ondansetron (ZOFRAN) IV, ondansetron, polyethylene glycol  Review of Systems: Constitutional: Weight is stable.  Eyes: +blind ENT: No oral lesions, sore throat.  GI: see HPI.  Heme/Lymph: No easy bruising.  CV: No chest pain.  GU: No hematuria.   Integumentary: No rashes.  Neuro: No headaches.  Psych: +depression/anxiety.  Endocrine: No heat/cold intolerance.  Allergic/Immunologic: No urticaria.  Resp: No cough, SOB.  Musculoskeletal: No joint swelling.    Physical Examination: BP 154/69 mmHg  Pulse 73  Temp(Src) 98.2 F (36.8 C) (Oral)  Resp 17  Ht 4\' 10"  (1.473 m)  Wt 84.5 kg (186 lb 4.6 oz)  BMI 38.94 kg/m2  SpO2 99%  LMP 11/21/2007 (Approximate) Gen: NAD, alert and oriented x 4 Neck: supple, no JVD or thyromegaly Chest: CTA bilaterally, no wheezes, crackles, or other adventitious sounds CV: RRR, no m/g/c/r Abd: soft, NT, ND, +BS in all four quadrants; no HSM, guarding, ridigity, or rebound tenderness Ext: no edema, well perfused with 2+ pulses, Skin: no rash or lesions noted Lymph: no LAD  Data: Lab Results  Component Value Date   WBC 6.6 02/28/2016   HGB 8.1* 02/28/2016   HCT 24.3* 02/28/2016   MCV 95.6 02/28/2016   PLT 182 02/28/2016    Recent Labs Lab 02/27/16 0603 02/27/16 1016 02/28/16 0537  HGB 8.5* 7.9* 8.1*   Lab Results  Component Value Date   NA 141 02/27/2016   K 3.3* 02/27/2016   CL 101 02/27/2016   CO2 30 02/27/2016   BUN 12 02/27/2016   CREATININE 2.11* 02/27/2016   Lab Results  Component Value Date   ALT 22 10/23/2015   AST 48* 10/23/2015   ALKPHOS 65 10/23/2015   BILITOT 0.8 10/23/2015   No results for input(s): APTT, INR, PTT in the last 168 hours.   Assessment/Plan: [name] is a 45 y.o. female anemia and obscure overt GI bleeding.  Hgb stable today.   Recommendations: - clear liquids - miralax 125 grams this evening - npo mn - capsule endo in am.   Please call with questions or concerns.  Erin [name], Erin Naegeli, MD

## 2016-02-29 ENCOUNTER — Inpatient Hospital Stay: Payer: Medicare Other | Admitting: Anesthesiology

## 2016-02-29 ENCOUNTER — Encounter
Admission: EM | Disposition: A | Discharge status: Home / Self Care | Payer: Self-pay | Source: Home / Self Care | Attending: Internal Medicine

## 2016-02-29 HISTORY — PX: PERIPHERAL VASCULAR CATHETERIZATION: SHX172C

## 2016-02-29 HISTORY — PX: GIVENS CAPSULE STUDY: SHX5432

## 2016-02-29 LAB — BASIC METABOLIC PANEL
ANION GAP: 8 (ref 5–15)
BUN: 26 mg/dL — ABNORMAL HIGH (ref 6–20)
CALCIUM: 7.8 mg/dL — AB (ref 8.9–10.3)
CO2: 29 mmol/L (ref 22–32)
Chloride: 102 mmol/L (ref 101–111)
Creatinine, Ser: 4.83 mg/dL — ABNORMAL HIGH (ref 0.44–1.00)
GFR calc Af Amer: 12 mL/min — ABNORMAL LOW (ref >60)
GFR calc non Af Amer: 10 mL/min — ABNORMAL LOW (ref >60)
GLUCOSE: 73 mg/dL (ref 65–99)
Potassium: 3.6 mmol/L (ref 3.5–5.1)
Sodium: 139 mmol/L (ref 135–145)

## 2016-02-29 LAB — CBC
HEMATOCRIT: 24.9 % — AB (ref 35.0–47.0)
Hemoglobin: 8.3 g/dL — ABNORMAL LOW (ref 12.0–16.0)
MCH: 31.9 pg (ref 26.0–34.0)
MCHC: 33.1 g/dL (ref 32.0–36.0)
MCV: 96.2 fL (ref 80.0–100.0)
Platelets: 177 10*3/uL (ref 150–440)
RBC: 2.59 MIL/uL — ABNORMAL LOW (ref 3.80–5.20)
RDW: 15.9 % — AB (ref 11.5–14.5)
WBC: 6.7 10*3/uL (ref 3.6–11.0)

## 2016-02-29 SURGERY — IMAGING PROCEDURE, GI TRACT, INTRALUMINAL, VIA CAPSULE

## 2016-02-29 SURGERY — DIALYSIS/PERMA CATHETER REMOVAL
Anesthesia: LOCAL

## 2016-02-29 MED ORDER — MIDAZOLAM HCL 5 MG/5ML IJ SOLN
INTRAMUSCULAR | Status: DC | PRN
  Administered 2016-02-29: 1 mg via INTRAVENOUS
  Administered 2016-02-29: 2 mg via INTRAVENOUS

## 2016-02-29 MED ORDER — FENTANYL CITRATE (PF) 100 MCG/2ML IJ SOLN
INTRAMUSCULAR | Status: DC | PRN
  Administered 2016-02-29: 25 ug via INTRAVENOUS

## 2016-02-29 MED ORDER — MIDAZOLAM HCL 5 MG/5ML IJ SOLN: INTRAMUSCULAR | Status: DC | PRN

## 2016-02-29 MED ORDER — PROPOFOL 500 MG/50ML IV EMUL: INTRAVENOUS | Status: DC | PRN

## 2016-02-29 MED ORDER — FENTANYL CITRATE (PF) 100 MCG/2ML IJ SOLN
INTRAMUSCULAR | Status: DC | PRN
  Administered 2016-02-29 (×2): 50 ug via INTRAVENOUS

## 2016-02-29 MED ORDER — FENTANYL CITRATE (PF) 100 MCG/2ML IJ SOLN: INTRAMUSCULAR | Status: DC | PRN

## 2016-02-29 MED ORDER — MORPHINE SULFATE (PF) 2 MG/ML IV SOLN
1.0000 mg | Freq: Once | INTRAVENOUS | Status: AC
  Administered 2016-02-29: 1 mg via INTRAVENOUS
  Filled 2016-02-29: qty 1

## 2016-02-29 MED ORDER — SODIUM CHLORIDE FLUSH 0.9 % IV SOLN
INTRAVENOUS | Status: AC
  Filled 2016-02-29: qty 10

## 2016-02-29 MED ORDER — LIDOCAINE HCL (CARDIAC) 20 MG/ML IV SOLN: INTRAVENOUS | Status: DC | PRN

## 2016-02-29 MED ORDER — PROPOFOL 10 MG/ML IV BOLUS
INTRAVENOUS | Status: DC | PRN
Start: 2016-02-29 — End: 2016-02-29

## 2016-02-29 MED ORDER — MIDAZOLAM HCL 5 MG/5ML IJ SOLN
INTRAMUSCULAR | Status: DC | PRN
  Administered 2016-02-29: 2 mg via INTRAVENOUS

## 2016-02-29 MED ORDER — MIDAZOLAM HCL 2 MG/2ML IJ SOLN
INTRAMUSCULAR | Status: AC
  Filled 2016-02-29: qty 2

## 2016-02-29 MED ORDER — FENTANYL CITRATE (PF) 100 MCG/2ML IJ SOLN
INTRAMUSCULAR | Status: DC | PRN
  Administered 2016-02-29: 50 ug via INTRAVENOUS

## 2016-02-29 SURGICAL SUPPLY — 5 items
FCP FG STRG 5.5XNS LF DISP (INSTRUMENTS) ×1
FORCEPS FG STRG 5.5XNS LF DISP (INSTRUMENTS) ×1 IMPLANT
FORCEPS KELLY 5.5 STR (INSTRUMENTS) ×1
TOWEL OR 17X26 4PK STRL BLUE (TOWEL DISPOSABLE) ×2 IMPLANT
TRAY LACERAT/PLASTIC (MISCELLANEOUS) ×2 IMPLANT

## 2016-02-29 NOTE — Progress Notes (Signed)
Pt transported back to her room. VSS.

## 2016-02-29 NOTE — Progress Notes (Signed)
Report to [place]

## 2016-02-29 NOTE — Progress Notes (Signed)
GI Note:  Capsule endoscopy swallowed today.  Hgb stable.  She is safe for d/c. Does not need to await capsule results for discharge.

## 2016-02-29 NOTE — H&P (Signed)
  [facility] History & Physical Update  The patient was interviewed and re-examined.  The patient's previous History and Physical has been reviewed and is unchanged.  There is no change in the plan of care. We plan to proceed with the scheduled procedure.  DEW,JASON, MD  02/29/2016, 12:07 PM

## 2016-02-29 NOTE — Care Management Important Message (Signed)
Important Message  Patient Details  Name: Erin [name] MRN: [id number] Date of Birth: 1971-05-29   Medicare Important Message Given:  Yes    Chapman Fitch, RN 02/29/2016, 10:43 AM

## 2016-02-29 NOTE — Clinical Social Work Note (Signed)
CSW contacted Tammy at [facility] and informed that patient is to discharge back to them tomorrow.  York Spaniel MSW,LCSW [phone]

## 2016-02-29 NOTE — Op Note (Signed)
Operative Note     Preoperative diagnosis:   1. ESRD with possible permcath infection  Postoperative diagnosis:  1. ESRD with possible permcath infection  Procedure:  Removal of left jugular Permcath  Surgeon:  Festus Barren, MD  Anesthesia:  Local  EBL:  Minimal  Indication for the Procedure:  The patient has ESRD and possible permcath infection and this needs be removed.  This was scheduled at [facility], but since she was admitted here, we are asked to do this.  Risks and benefits are discussed and informed consent is obtained.  Description of the Procedure:  The patient's left neck, chest and existing catheter were sterilely prepped and draped. The area around the catheter was anesthetized copiously with 1% lidocaine. The catheter was dissected out with curved hemostats until the cuff was freed from the surrounding fibrous sheath. The fiber sheath was transected, and the catheter was then removed in its entirety using gentle traction. Pressure was held and sterile dressings were placed. The patient tolerated the procedure well and was taken to the recovery room in stable condition.     Nabor Thomann  02/29/2016, 12:08 PM

## 2016-02-29 NOTE — Transfer of Care (Deleted)
Immediate Anesthesia Transfer of Care Note  Patient: Erin [name]  Procedure(s) Performed: Procedure(s): GIVENS CAPSULE STUDY (N/A)  Patient Location: PACU and Endoscopy Unit  Anesthesia Type:General  Level of Consciousness: awake  Airway & Oxygen Therapy: Patient Spontanous Breathing  Post-op Assessment: Report given to RN  Post vital signs: Reviewed and stable  Last Vitals:  Filed Vitals:   02/28/16 2152 02/29/16 0619  BP: 154/69 164/72  Pulse: 73 72  Temp: 36.8 C 36.6 C  Resp: 17 17    Last Pain:  Filed Vitals:   02/29/16 0934  PainSc: 10-Worst pain ever      Patients Stated Pain Goal: 0 (02/28/16 2100)  Complications: No apparent anesthesia complications

## 2016-02-29 NOTE — Progress Notes (Signed)
Pre dialysis assessment 

## 2016-02-29 NOTE — Progress Notes (Signed)
Post dialysis assessment 

## 2016-02-29 NOTE — Progress Notes (Signed)
Subjective:  Patient initially stated she didn't want to perform dialysis today. However later today agreed she would go for HD.     Objective:  Vital signs in last 24 hours:  Temp:  [97.8 F (36.6 C)-99.2 F (37.3 C)] 98.3 F (36.8 C) (05/01 1441) Pulse Rate:  [68-78] 78 (05/01 1441) Resp:  [15-18] 17 (05/01 1441) BP: (119-180)/(47-85) 149/67 mmHg (05/01 1441) SpO2:  [94 %-100 %] 99 % (05/01 1441) Weight:  [76.476 kg (168 lb 9.6 oz)] 76.476 kg (168 lb 9.6 oz) (05/01 0619)  Weight change:  Filed Weights   02/26/16 1100 02/26/16 1415 02/29/16 0619  Weight: 84.5 kg (186 lb 4.6 oz) 84.5 kg (186 lb 4.6 oz) 76.476 kg (168 lb 9.6 oz)    Intake/Output:    Intake/Output Summary (Last 24 hours) at 02/29/16 1754 Last data filed at 02/29/16 1610  Gross per 24 hour  Intake    120 ml  Output      0 ml  Net    120 ml     Physical Exam: General: NAD, laying in bed  HEENT Anicteric, moist mucus membranes  Neck supple  Pulm/lungs Clear, room air, normal effort  CVS/Heart Regular, no rub, harsh systolic murmur  Abdomen:  Soft, non tender  Extremities: trace peripheral edema  Neurologic: Alert, able to follow commands  Skin: No acute rashes  Access: Left IJ permcath, Right arm AV graft       Basic Metabolic Panel:   Recent Labs Lab 02/25/16 1218 02/27/16 0603 02/29/16 0547  NA 135 141 139  K 3.5 3.3* 3.6  CL 99* 101 102  CO2 GLUCOSE 73 79 73  BUN 51* 12 26*  CREATININE 4.73* 2.11* 4.83*  CALCIUM 7.8* 7.9* 7.8*     CBC:  Recent Labs Lab 02/25/16 1218 02/26/16 0527  02/26/16 1817 02/26/16 2333 02/27/16 0603 02/27/16 1016 02/28/16 0537 02/29/16 0547  WBC 11.1* 6.2  --   --   --  6.6  --  6.6 6.7  HGB 5.1* 5.6*  < > 9.1* 8.7* 8.5* 7.9* 8.1* 8.3*  HCT 15.6* 16.3*  < > 26.3* 25.8* 25.0*  --  24.3* 24.9*  MCV 91.7 90.8  --   --   --  92.4  --  95.6 96.2  PLT 232 173  --   --   --  196  --  182 177  < > = values in this interval not  displayed.    Microbiology:  Recent Results (from the past 720 hour(s))  MRSA PCR Screening     Status: None   Collection Time: 02/26/16  6:02 AM  Result Value Ref Range Status   MRSA by PCR NEGATIVE NEGATIVE Final    Comment:        The GeneXpert MRSA Assay (FDA approved for NASAL specimens only), is one component of a comprehensive MRSA colonization surveillance program. It is not intended to diagnose MRSA infection nor to guide or monitor treatment for MRSA infections.     Coagulation Studies: No results for input(s): LABPROT, INR in the last 72 hours.  Urinalysis: No results for input(s): COLORURINE, LABSPEC, PHURINE, GLUCOSEU, HGBUR, BILIRUBINUR, KETONESUR, PROTEINUR, UROBILINOGEN, NITRITE, LEUKOCYTESUR in the last 72 hours.  Invalid input(s): APPERANCEUR    Imaging: No results found.   Medications:     . sodium chloride   Intravenous Once  . epoetin (EPOGEN/PROCRIT) injection  10,000 Units Intravenous Q M,W,F-HD  . estradiol  1 Applicatorful Vaginal Once  per day on Mon Thu  . fenofibrate  160 mg Oral Daily  . fludrocortisone  0.1 mg Oral Daily  . furosemide  40 mg Oral BID  . gabapentin  400 mg Oral QHS  . hydrocortisone  10 mg Oral q morning - 10a  . hydrocortisone  5 mg Oral QHS  . hydrOXYzine  25 mg Oral TID  . levothyroxine  112 mcg Oral QAC breakfast  . loratadine  10 mg Oral Daily  . methylphenidate  20 mg Oral Q lunch  . methylphenidate  30 mg Oral QAC breakfast  . midazolam      . mometasone-formoterol  2 puff Inhalation BID  . multivitamin with minerals  1 tablet Oral Daily  . oxyCODONE  10 mg Oral Q8H  . polyethylene glycol powder  0.5 Container Oral Once  . pravastatin  40 mg Oral q1800  . risperiDONE  2 mg Oral QHS  . risperidone  4 mg Oral q morning - 10a  . sertraline  50 mg Oral Daily  . sevelamer carbonate  4,000 mg Oral TID WC  . sodium chloride flush      . topiramate  200 mg Oral BID  . topiramate  50 mg Oral BID  . Vitamin D  (Ergocalciferol)  50,000 Units Oral Q30 days   acetaminophen **OR** acetaminophen, albuterol, cyclobenzaprine, fentaNYL, fentaNYL, fentaNYL, guaiFENesin, guaifenesin, HYDROmorphone, midazolam, midazolam, ondansetron **OR** ondansetron (ZOFRAN) IV, ondansetron, polyethylene glycol  Assessment/ Plan:  45 y.o. 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 ESRD- [facility]/ MWF-2/ [facility] - Dialysis while in the hospital - Now that AV graft is working, we will request vascular to remove the PermCath - patient initially didn't want to have HD today, but later agreed today.  Complete HD today.  2. AoCKD and blood loss - GI bleed suspected Work up in progress; blood transfusion given 4/27 - iv procrit with HD  - capsule endoscopy started today. - Hgb currently 8.3.  3. SHPTH - continue renvela with meals for phos control.     LOS: 4 Yahayra Geis 5/1/20175:54 PM

## 2016-02-29 NOTE — Progress Notes (Signed)
[facility] [place] at [facility]   PATIENT NAME: Erin [name]    MR#:  [id number]  DATE OF BIRTH:  10-04-1971  SUBJECTIVE:  No acute issues She went for capsule today no signs of GIB  REVIEW OF SYSTEMS:    Review of Systems  Constitutional: Negative for fever, chills and malaise/fatigue.  HENT: Negative for ear discharge, ear pain, hearing loss, nosebleeds and sore throat.   Eyes: Negative for blurred vision and pain.  Respiratory: Negative for cough, hemoptysis, shortness of breath and wheezing.   Cardiovascular: Negative for chest pain, palpitations and leg swelling.  Gastrointestinal: Negative for nausea, vomiting, abdominal pain, diarrhea and blood in stool.  Genitourinary: Negative for dysuria.  Musculoskeletal: Negative for back pain.  Neurological: Negative for dizziness, tremors, speech change, focal weakness, seizures and headaches.  Endo/Heme/Allergies: Does not bruise/bleed easily.  Psychiatric/Behavioral: Negative for depression, suicidal ideas and hallucinations.    Tolerating Diet:yes      DRUG ALLERGIES:   Allergies  Allergen Reactions  . Dhea [Nutritional Supplements] Anaphylaxis    Patient states medication is DHE for migraines, not DHEA  . Demerol [Meperidine] Other (See Comments)    Reaction:  Hallucinations   . Floxin [Ofloxacin] Other (See Comments)    Reaction:  Hallucinations   . Nsaids Nausea And Vomiting and Swelling  . Nubain [Nalbuphine Hcl] Other (See Comments)    Reaction:  Hallucinations   . Phenergan [Promethazine Hcl] Other (See Comments)    Reaction:  Restless legs   . Stadol [Butorphanol] Other (See Comments)    Reaction:  Hallucinations   . Erythromycin Diarrhea and Rash    VITALS:  Blood pressure 149/67, pulse 78, temperature 98.3 F (36.8 C), temperature source Oral, resp. rate 17, height  (1.473 m), weight 76.476 kg (168 lb 9.6 oz), last menstrual period 11/21/2007, SpO2 99 %.  PHYSICAL  EXAMINATION:   Physical Exam  Constitutional: She is oriented to person, place, and time and well-developed, well-nourished, and in no distress. No distress.  HENT:  Head: Normocephalic.  Eyes: No scleral icterus.  Neck: Normal range of motion. Neck supple. No JVD present. No tracheal deviation present.  Cardiovascular: Normal rate, regular rhythm and normal heart sounds.  Exam reveals no gallop and no friction rub.   No murmur heard. Pulmonary/Chest: Effort normal and breath sounds normal. No respiratory distress. She has no wheezes. She has no rales. She exhibits no tenderness.  Abdominal: Soft. Bowel sounds are normal. She exhibits no distension and no mass. There is no tenderness. There is no rebound and no guarding.  Musculoskeletal: Normal range of motion. She exhibits no edema.  Neurological: She is alert and oriented to person, place, and time.  Skin: Skin is warm. No rash noted. No erythema.  Psychiatric: Affect and judgment normal.      LABORATORY PANEL:   CBC  Recent Labs Lab 02/29/16 0547  WBC 6.7  HGB 8.3*  HCT 24.9*  PLT 177   ------------------------------------------------------------------------------------------------------------------  Chemistries   Recent Labs Lab 02/29/16 0547  NA 139  K 3.6  CL 102  CO2 29  GLUCOSE 73  BUN 26*  CREATININE 4.83*  CALCIUM 7.8*   ------------------------------------------------------------------------------------------------------------------  Cardiac Enzymes  Recent Labs Lab 02/25/16 1218  TROPONINI 0.05*   ------------------------------------------------------------------------------------------------------------------  RADIOLOGY:  No results found.   ASSESSMENT AND PLAN:   45 y.o. 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  1. Acute on chronic anemia likely due to slow GI bleed versus anemia of chronic disease. She received 3 units of PRBC.  She also received EPO during dialysis. She had an unremarkable upper GI and colonoscopy in August 2016 at [facility]. Hgb 8.3 this am no recent blood transfusions given over past 2 days. She has vest on for capsule study.  2. End-stage renal disease on hemodialysis: She will continue HD on her regular outpatient days. Permacath to be removed today  3. Fibromyalgia, history of narcotic abuse, chronic pain syndrome Continue patient's home meds Patient goes to pain clinic in [facility]  4. History of seizures: She will continue her medications.  5. Bipolar/depression: She was evaluated by Psychiatrist here and Dr Toni Amend did not recommend changing her medications. She did not meet inpatient Psychiatric criteria and is at her baseline.   6. Hyperlipidemia; She will continue Lovastatin       Management plans discussed with the patient and she is in agreement.  CODE STATUS: FULL  TOTAL TIME TAKING CARE OF THIS PATIENT: 25 minutes.     POSSIBLE D/C today after HD, DEPENDING ON CLINICAL CONDITION.   Junah Yam M.D on 02/29/2016 at 3:31 PM  Between 7am to 6pm - Pager - [phone] After 6pm go to [email or web address] - password [email or web address]  [facility]  Office  [phone]  CC: Primary care physician; Pcp Not In System  Note: This dictation was prepared with Dragon dictation along with smaller phrase technology. Any transcriptional errors that result from this process are unintentional.

## 2016-02-29 NOTE — Transfer of Care (Deleted)
Immediate Anesthesia Transfer of Care Note  Patient: Erin [name]  Procedure(s) Performed: Procedure(s): GIVENS CAPSULE STUDY (N/A)  Patient Location: PACU and Endoscopy Unit  Anesthesia Type:General  Level of Consciousness: awake  Airway & Oxygen Therapy: Patient Spontanous Breathing  Post-op Assessment: Report given to RN  Post vital signs: stable  Last Vitals:  Filed Vitals:   02/28/16 2152 02/29/16 0619  BP: 154/69 164/72  Pulse: 73 72  Temp: 36.8 C 36.6 C  Resp: 17 17    Last Pain:  Filed Vitals:   02/29/16 0934  PainSc: 10-Worst pain ever      Patients Stated Pain Goal: 0 (02/28/16 2100)  Complications: No apparent anesthesia complications

## 2016-02-29 NOTE — Progress Notes (Signed)
Dr. Cherylann Ratel paged regarding pt's dialysis. Pt is agreeing to go today, Dr. Cherylann Ratel said to notify dialysis RN. Dialysis RN notified by primary RN that pt is willing to have dialysis treatment today.

## 2016-03-01 ENCOUNTER — Encounter: Payer: Self-pay | Admitting: Vascular Surgery

## 2016-03-01 NOTE — Clinical Social Work Note (Signed)
MD has cleared patient for discharge to return to [facility] today. Patient prefers to be transported by a friend back to [facility]. CSW contacted Tammy at [facility] and she is aware of patient's preference for transport today and stated that it actually helps them because they are having a busy day.  York Spaniel MSW,LCSW [phone]

## 2016-03-01 NOTE — Progress Notes (Signed)
03/01/2016 11:49 AM  BP 144/69 mmHg  Pulse 64  Temp(Src) 98.5 F (36.9 C) (Oral)  Resp 16  Ht 4\' 10"  (1.473 m)  Wt 73 kg (160 lb 15 oz)  BMI 33.64 kg/m2  SpO2 100%  LMP 11/21/2007 (Approximate) Patient discharged per MD orders. Report given to Tonya at [facility]. Discharge packet sent with patient and friend Marissa at discharge back to facility. IV removed per policy. Prescriptions given to patient in packet. Discharged via wheelchair escorted by nursing staff.  Ron Parker, RN

## 2016-03-03 ENCOUNTER — Observation Stay
Admission: EM | Admit: 2016-03-03 | Discharge: 2016-03-07 | Disposition: A | Discharge status: Home / Self Care | Payer: Medicare Other | Attending: Internal Medicine | Admitting: Internal Medicine

## 2016-03-03 ENCOUNTER — Encounter: Payer: Self-pay | Admitting: Emergency Medicine

## 2016-03-03 DIAGNOSIS — G40909 Epilepsy, unspecified, not intractable, without status epilepticus: Secondary | ICD-10-CM | POA: Insufficient documentation

## 2016-03-03 DIAGNOSIS — Z992 Dependence on renal dialysis: Secondary | ICD-10-CM | POA: Insufficient documentation

## 2016-03-03 DIAGNOSIS — E859 Amyloidosis, unspecified: Secondary | ICD-10-CM | POA: Diagnosis not present

## 2016-03-03 DIAGNOSIS — Z86718 Personal history of other venous thrombosis and embolism: Secondary | ICD-10-CM | POA: Diagnosis not present

## 2016-03-03 DIAGNOSIS — F29 Unspecified psychosis not due to a substance or known physiological condition: Secondary | ICD-10-CM | POA: Insufficient documentation

## 2016-03-03 DIAGNOSIS — F419 Anxiety disorder, unspecified: Secondary | ICD-10-CM | POA: Insufficient documentation

## 2016-03-03 DIAGNOSIS — N186 End stage renal disease: Secondary | ICD-10-CM | POA: Diagnosis not present

## 2016-03-03 DIAGNOSIS — H540 Blindness, both eyes: Secondary | ICD-10-CM | POA: Insufficient documentation

## 2016-03-03 DIAGNOSIS — K922 Gastrointestinal hemorrhage, unspecified: Principal | ICD-10-CM | POA: Diagnosis present

## 2016-03-03 DIAGNOSIS — M797 Fibromyalgia: Secondary | ICD-10-CM | POA: Diagnosis not present

## 2016-03-03 DIAGNOSIS — R17 Unspecified jaundice: Secondary | ICD-10-CM | POA: Insufficient documentation

## 2016-03-03 DIAGNOSIS — E7220 Disorder of urea cycle metabolism, unspecified: Secondary | ICD-10-CM | POA: Diagnosis present

## 2016-03-03 DIAGNOSIS — R531 Weakness: Secondary | ICD-10-CM | POA: Diagnosis not present

## 2016-03-03 DIAGNOSIS — R42 Dizziness and giddiness: Secondary | ICD-10-CM

## 2016-03-03 DIAGNOSIS — R51 Headache: Secondary | ICD-10-CM | POA: Diagnosis not present

## 2016-03-03 DIAGNOSIS — F0281 Dementia in other diseases classified elsewhere with behavioral disturbance: Secondary | ICD-10-CM | POA: Diagnosis not present

## 2016-03-03 DIAGNOSIS — E785 Hyperlipidemia, unspecified: Secondary | ICD-10-CM | POA: Insufficient documentation

## 2016-03-03 DIAGNOSIS — G47419 Narcolepsy without cataplexy: Secondary | ICD-10-CM | POA: Insufficient documentation

## 2016-03-03 DIAGNOSIS — Z8673 Personal history of transient ischemic attack (TIA), and cerebral infarction without residual deficits: Secondary | ICD-10-CM | POA: Diagnosis not present

## 2016-03-03 DIAGNOSIS — Z8249 Family history of ischemic heart disease and other diseases of the circulatory system: Secondary | ICD-10-CM | POA: Insufficient documentation

## 2016-03-03 DIAGNOSIS — J45909 Unspecified asthma, uncomplicated: Secondary | ICD-10-CM | POA: Diagnosis not present

## 2016-03-03 DIAGNOSIS — E039 Hypothyroidism, unspecified: Secondary | ICD-10-CM | POA: Insufficient documentation

## 2016-03-03 DIAGNOSIS — R1084 Generalized abdominal pain: Secondary | ICD-10-CM | POA: Insufficient documentation

## 2016-03-03 DIAGNOSIS — Z833 Family history of diabetes mellitus: Secondary | ICD-10-CM | POA: Insufficient documentation

## 2016-03-03 DIAGNOSIS — Z8719 Personal history of other diseases of the digestive system: Secondary | ICD-10-CM

## 2016-03-03 DIAGNOSIS — Z9049 Acquired absence of other specified parts of digestive tract: Secondary | ICD-10-CM | POA: Insufficient documentation

## 2016-03-03 DIAGNOSIS — F3160 Bipolar disorder, current episode mixed, unspecified: Secondary | ICD-10-CM | POA: Diagnosis not present

## 2016-03-03 DIAGNOSIS — F329 Major depressive disorder, single episode, unspecified: Secondary | ICD-10-CM | POA: Diagnosis not present

## 2016-03-03 DIAGNOSIS — J323 Chronic sphenoidal sinusitis: Secondary | ICD-10-CM | POA: Insufficient documentation

## 2016-03-03 DIAGNOSIS — Z885 Allergy status to narcotic agent status: Secondary | ICD-10-CM | POA: Insufficient documentation

## 2016-03-03 DIAGNOSIS — F17210 Nicotine dependence, cigarettes, uncomplicated: Secondary | ICD-10-CM | POA: Diagnosis not present

## 2016-03-03 DIAGNOSIS — D631 Anemia in chronic kidney disease: Secondary | ICD-10-CM | POA: Diagnosis not present

## 2016-03-03 DIAGNOSIS — F3162 Bipolar disorder, current episode mixed, moderate: Secondary | ICD-10-CM | POA: Diagnosis present

## 2016-03-03 DIAGNOSIS — Z888 Allergy status to other drugs, medicaments and biological substances status: Secondary | ICD-10-CM | POA: Insufficient documentation

## 2016-03-03 DIAGNOSIS — K210 Gastro-esophageal reflux disease with esophagitis: Secondary | ICD-10-CM | POA: Diagnosis not present

## 2016-03-03 DIAGNOSIS — K219 Gastro-esophageal reflux disease without esophagitis: Secondary | ICD-10-CM | POA: Insufficient documentation

## 2016-03-03 DIAGNOSIS — R0781 Pleurodynia: Secondary | ICD-10-CM

## 2016-03-03 DIAGNOSIS — M79604 Pain in right leg: Secondary | ICD-10-CM | POA: Insufficient documentation

## 2016-03-03 DIAGNOSIS — G9389 Other specified disorders of brain: Secondary | ICD-10-CM | POA: Insufficient documentation

## 2016-03-03 DIAGNOSIS — Z881 Allergy status to other antibiotic agents status: Secondary | ICD-10-CM | POA: Insufficient documentation

## 2016-03-03 DIAGNOSIS — R079 Chest pain, unspecified: Secondary | ICD-10-CM | POA: Insufficient documentation

## 2016-03-03 LAB — CBC
HEMATOCRIT: 24.2 % — AB (ref 35.0–47.0)
HEMOGLOBIN: 7.8 g/dL — AB (ref 12.0–16.0)
MCH: 31 pg (ref 26.0–34.0)
MCHC: 32.4 g/dL (ref 32.0–36.0)
MCV: 95.9 fL (ref 80.0–100.0)
Platelets: 156 10*3/uL (ref 150–440)
RBC: 2.52 MIL/uL — ABNORMAL LOW (ref 3.80–5.20)
RDW: 16.7 % — AB (ref 11.5–14.5)
WBC: 5.7 10*3/uL (ref 3.6–11.0)

## 2016-03-03 NOTE — ED Notes (Signed)
Pt pt via EMS from [facility]; EMS called out due to dizziness.  Pt reports dizzy episodes occurring since yesterday, reports feeling weak today.  Pt denies any N/V/D.  Pt was seen here last week due to low Hgb and received blood transfusion x 2.  Pt A/Ox4, vitals WDL, no immediate distress at this time.

## 2016-03-04 ENCOUNTER — Observation Stay: Payer: Medicare Other

## 2016-03-04 ENCOUNTER — Emergency Department: Payer: Medicare Other

## 2016-03-04 ENCOUNTER — Encounter: Payer: Self-pay | Admitting: Internal Medicine

## 2016-03-04 DIAGNOSIS — R531 Weakness: Secondary | ICD-10-CM

## 2016-03-04 DIAGNOSIS — K219 Gastro-esophageal reflux disease without esophagitis: Secondary | ICD-10-CM | POA: Diagnosis present

## 2016-03-04 DIAGNOSIS — Z992 Dependence on renal dialysis: Secondary | ICD-10-CM

## 2016-03-04 DIAGNOSIS — K922 Gastrointestinal hemorrhage, unspecified: Secondary | ICD-10-CM | POA: Diagnosis present

## 2016-03-04 DIAGNOSIS — E039 Hypothyroidism, unspecified: Secondary | ICD-10-CM | POA: Diagnosis present

## 2016-03-04 DIAGNOSIS — N186 End stage renal disease: Secondary | ICD-10-CM

## 2016-03-04 LAB — COMPREHENSIVE METABOLIC PANEL
ALBUMIN: 2.5 g/dL — AB (ref 3.5–5.0)
ALBUMIN: 2.6 g/dL — AB (ref 3.5–5.0)
ALK PHOS: 132 U/L — AB (ref 38–126)
ALT: 14 U/L (ref 14–54)
ALT: 14 U/L (ref 14–54)
ANION GAP: 7 (ref 5–15)
ANION GAP: 8 (ref 5–15)
AST: 36 U/L (ref 15–41)
AST: 32 U/L (ref 15–41)
Alkaline Phosphatase: 117 U/L (ref 38–126)
BUN: 22 mg/dL — ABNORMAL HIGH (ref 6–20)
BUN: 25 mg/dL — AB (ref 6–20)
CO2: 30 mmol/L (ref 22–32)
CO2: 28 mmol/L (ref 22–32)
Calcium: 7.3 mg/dL — ABNORMAL LOW (ref 8.9–10.3)
Calcium: 7.3 mg/dL — ABNORMAL LOW (ref 8.9–10.3)
Chloride: 99 mmol/L — ABNORMAL LOW (ref 101–111)
Chloride: 100 mmol/L — ABNORMAL LOW (ref 101–111)
Creatinine, Ser: 4.14 mg/dL — ABNORMAL HIGH (ref 0.44–1.00)
Creatinine, Ser: 4.55 mg/dL — ABNORMAL HIGH (ref 0.44–1.00)
GFR calc Af Amer: 14 mL/min — ABNORMAL LOW (ref >60)
GFR calc Af Amer: 13 mL/min — ABNORMAL LOW (ref >60)
GFR calc non Af Amer: 12 mL/min — ABNORMAL LOW (ref >60)
GFR calc non Af Amer: 11 mL/min — ABNORMAL LOW (ref >60)
GLUCOSE: 99 mg/dL (ref 65–99)
GLUCOSE: 71 mg/dL (ref 65–99)
POTASSIUM: 3.1 mmol/L — AB (ref 3.5–5.1)
POTASSIUM: 3.6 mmol/L (ref 3.5–5.1)
SODIUM: 136 mmol/L (ref 135–145)
SODIUM: 136 mmol/L (ref 135–145)
Total Bilirubin: 0.9 mg/dL (ref 0.3–1.2)
Total Bilirubin: 0.7 mg/dL (ref 0.3–1.2)
Total Protein: 5.1 g/dL — ABNORMAL LOW (ref 6.5–8.1)
Total Protein: 4.8 g/dL — ABNORMAL LOW (ref 6.5–8.1)

## 2016-03-04 LAB — PHOSPHORUS: Phosphorus: 2.6 mg/dL (ref 2.5–4.6)

## 2016-03-04 LAB — AMMONIA
Ammonia: 62 umol/L — ABNORMAL HIGH (ref 9–35)
Ammonia: 29 umol/L (ref 9–35)

## 2016-03-04 LAB — TROPONIN I: Troponin I: <0.03 ng/mL (ref <0.031)

## 2016-03-04 MED ORDER — SODIUM CHLORIDE 0.9 % IV SOLN: INTRAVENOUS | Status: DC

## 2016-03-04 MED ORDER — MOMETASONE FURO-FORMOTEROL FUM 100-5 MCG/ACT IN AERO
2.0000 | INHALATION_SPRAY | Freq: Two times a day (BID) | RESPIRATORY_TRACT | Status: DC
  Administered 2016-03-04 – 2016-03-07 (×6): 2 via RESPIRATORY_TRACT
  Filled 2016-03-04: qty 8.8

## 2016-03-04 MED ORDER — PRAVASTATIN SODIUM 20 MG PO TABS
20.0000 mg | ORAL_TABLET | Freq: Every day | ORAL | Status: DC
  Administered 2016-03-04 – 2016-03-06 (×3): 20 mg via ORAL
  Filled 2016-03-04 (×3): qty 1

## 2016-03-04 MED ORDER — HEPARIN SODIUM (PORCINE) 1000 UNIT/ML DIALYSIS: 1000.0000 [IU] | INTRAMUSCULAR | Status: DC | PRN

## 2016-03-04 MED ORDER — HYDROCORTISONE 10 MG PO TABS
5.0000 mg | ORAL_TABLET | Freq: Every day | ORAL | Status: DC
  Administered 2016-03-04 – 2016-03-06 (×3): 5 mg via ORAL
  Filled 2016-03-04 (×3): qty 1

## 2016-03-04 MED ORDER — SEVELAMER CARBONATE 800 MG PO TABS
4000.0000 mg | ORAL_TABLET | Freq: Three times a day (TID) | ORAL | Status: DC
  Administered 2016-03-04 – 2016-03-07 (×8): 4000 mg via ORAL
  Filled 2016-03-04 (×10): qty 5

## 2016-03-04 MED ORDER — LIDOCAINE-PRILOCAINE 2.5-2.5 % EX CREA
1.0000 "application " | TOPICAL_CREAM | CUTANEOUS | Status: DC | PRN
  Filled 2016-03-04: qty 5

## 2016-03-04 MED ORDER — TOPIRAMATE 25 MG PO TABS
50.0000 mg | ORAL_TABLET | Freq: Two times a day (BID) | ORAL | Status: DC
  Administered 2016-03-04 – 2016-03-07 (×7): 50 mg via ORAL
  Filled 2016-03-04 (×7): qty 2

## 2016-03-04 MED ORDER — SODIUM CHLORIDE 0.9 % IV SOLN: 100.0000 mL | INTRAVENOUS | Status: DC | PRN

## 2016-03-04 MED ORDER — HYDROCORTISONE 10 MG PO TABS
10.0000 mg | ORAL_TABLET | Freq: Every day | ORAL | Status: DC
  Administered 2016-03-04 – 2016-03-07 (×4): 10 mg via ORAL
  Filled 2016-03-04 (×6): qty 1

## 2016-03-04 MED ORDER — HYDROCORTISONE 5 MG PO TABS: 5.0000 mg | ORAL_TABLET | Freq: Two times a day (BID) | ORAL | Status: DC

## 2016-03-04 MED ORDER — EPOETIN ALFA 10000 UNIT/ML IJ SOLN
10000.0000 [IU] | Freq: Once | INTRAMUSCULAR | Status: AC
  Administered 2016-03-04: 10000 [IU] via INTRAVENOUS

## 2016-03-04 MED ORDER — LIDOCAINE HCL (PF) 1 % IJ SOLN
5.0000 mL | INTRAMUSCULAR | Status: DC | PRN
  Filled 2016-03-04: qty 5

## 2016-03-04 MED ORDER — PENTAFLUOROPROP-TETRAFLUOROETH EX AERO
1.0000 "application " | INHALATION_SPRAY | CUTANEOUS | Status: DC | PRN
  Filled 2016-03-04: qty 30

## 2016-03-04 MED ORDER — SERTRALINE HCL 50 MG PO TABS
50.0000 mg | ORAL_TABLET | Freq: Every day | ORAL | Status: DC
  Administered 2016-03-04 – 2016-03-07 (×4): 50 mg via ORAL
  Filled 2016-03-04 (×4): qty 1

## 2016-03-04 MED ORDER — TOPIRAMATE 100 MG PO TABS
200.0000 mg | ORAL_TABLET | Freq: Two times a day (BID) | ORAL | Status: DC
  Administered 2016-03-04 – 2016-03-07 (×7): 200 mg via ORAL
  Filled 2016-03-04 (×7): qty 2

## 2016-03-04 MED ORDER — SODIUM CHLORIDE 0.9% FLUSH
3.0000 mL | Freq: Two times a day (BID) | INTRAVENOUS | Status: DC
  Administered 2016-03-04 – 2016-03-07 (×7): 3 mL via INTRAVENOUS

## 2016-03-04 MED ORDER — ONDANSETRON HCL 4 MG/2ML IJ SOLN: 4.0000 mg | Freq: Four times a day (QID) | INTRAMUSCULAR | Status: DC | PRN

## 2016-03-04 MED ORDER — LEVOTHYROXINE SODIUM 112 MCG PO TABS
112.0000 ug | ORAL_TABLET | Freq: Every day | ORAL | Status: DC
  Administered 2016-03-04 – 2016-03-07 (×4): 112 ug via ORAL
  Filled 2016-03-04 (×4): qty 1

## 2016-03-04 MED ORDER — RISPERIDONE 1 MG PO TABS
2.0000 mg | ORAL_TABLET | Freq: Every day | ORAL | Status: DC
  Administered 2016-03-04 – 2016-03-06 (×3): 2 mg via ORAL
  Filled 2016-03-04 (×4): qty 2

## 2016-03-04 MED ORDER — FLUDROCORTISONE ACETATE 0.1 MG PO TABS
0.1000 mg | ORAL_TABLET | Freq: Every day | ORAL | Status: DC
  Administered 2016-03-04 – 2016-03-07 (×4): 0.1 mg via ORAL
  Filled 2016-03-04 (×4): qty 1

## 2016-03-04 MED ORDER — RISPERIDONE 1 MG PO TABS
4.0000 mg | ORAL_TABLET | Freq: Every morning | ORAL | Status: DC
  Administered 2016-03-04 – 2016-03-07 (×4): 4 mg via ORAL
  Filled 2016-03-04 (×4): qty 4

## 2016-03-04 MED ORDER — ALTEPLASE 2 MG IJ SOLR
2.0000 mg | Freq: Once | INTRAMUSCULAR | Status: DC | PRN
  Filled 2016-03-04: qty 2

## 2016-03-04 MED ORDER — ACETAMINOPHEN 325 MG PO TABS
650.0000 mg | ORAL_TABLET | Freq: Four times a day (QID) | ORAL | Status: DC | PRN
  Filled 2016-03-04: qty 2

## 2016-03-04 MED ORDER — ACETAMINOPHEN 650 MG RE SUPP: 650.0000 mg | Freq: Four times a day (QID) | RECTAL | Status: DC | PRN

## 2016-03-04 MED ORDER — PANTOPRAZOLE SODIUM 40 MG PO TBEC
40.0000 mg | DELAYED_RELEASE_TABLET | Freq: Every day | ORAL | Status: DC
  Administered 2016-03-04 – 2016-03-07 (×4): 40 mg via ORAL
  Filled 2016-03-04 (×4): qty 1

## 2016-03-04 MED ORDER — HYDROMORPHONE HCL 2 MG PO TABS
6.0000 mg | ORAL_TABLET | Freq: Four times a day (QID) | ORAL | Status: DC | PRN
  Administered 2016-03-04 – 2016-03-07 (×12): 6 mg via ORAL
  Filled 2016-03-04 (×12): qty 3

## 2016-03-04 MED ORDER — ONDANSETRON HCL 4 MG PO TABS: 4.0000 mg | ORAL_TABLET | Freq: Four times a day (QID) | ORAL | Status: DC | PRN

## 2016-03-04 MED ORDER — ALBUTEROL SULFATE (2.5 MG/3ML) 0.083% IN NEBU: 3.0000 mL | INHALATION_SOLUTION | Freq: Four times a day (QID) | RESPIRATORY_TRACT | Status: DC | PRN

## 2016-03-04 MED ORDER — RISPERIDONE 0.5 MG PO TABS: 2.0000 mg | ORAL_TABLET | Freq: Two times a day (BID) | ORAL | Status: DC

## 2016-03-04 NOTE — Consult Note (Signed)
GI Inpatient Consult Note  Reason for Consult: Anemia, BRBPR   Attending Requesting Consult: Dr. Anne Hahn  History of Present Illness: Erin [name] is a 45 y.o. female who was recently hospitalized at [facility] with the same chief complaint.  I saw her during her last hospitalization along with Dr. Shelle Iron.  The HPI from my last consult note:  Significant past medical history of previous GI bleeds, end-stage renal disease on dialysis Tuesdays, Thursdays, Saturday, blindness, was found to have a hemoglobin of 5.6 in the emergency department and had stool in her diaper that was positive for blood.  She is a poor historian, some information was taken from her, but majority from medical records.  She reports that the last time she went dialysis they told her that she was acting weird, she denies feeling any different, denies abdominal pain, is unsure if she was having loose bowel movements or if they were formed. She reports that 1 of her helpers told her that they saw blood when they cleaned her. She resides at [facility] assisted living.  Her medical records show that she was hospitalized at [facility] in August 2016 and developed a GI bleed during the hospitalization. She had an upper endoscopy and colonoscopy during that stay. Copies of those records:  Developed new melanotic stools 8/10-8/11 overnight with multiple large maroon bowel movements. She was transferring back from the commode with an aide and felt lightheaded and "as if the world was closing in on her" and had to be helped to the ground. A code blue was called as she was reportedly unresponsive for a short period of time but maintained her saturations and pulse throughout the episode. She was more hypotensive than baseline in the 80-90s/30-40s and was transferred to the step-down unit. A central line was attempted without success and eventually her LLE graft was accessed by the dialysis nurse for resuscitation. She was  hemodynamically responsive to crystalloids (1 L) and 1 uPRBC.  She was intubated for unstable mental status and emergent EGD, which revealed no source of bleeding. A femoral central line was placed. The next day, she was extubated with no continuing oxygen requirement, and then subsequently underwent colonoscopy which also revealed no source of bleeding. Bleeding most likely from the small bowel as described above. No further workup indicated at this time as her hemoglobin remained stable around ~8 with no She received hemodialysis on 8/12. She was stable, alert, with no O2 requirement, and deemed stable for discharge following completion of her hemodialysis session on 8/15.  Findings: LA Grade B (one or more mucosal breaks greater than 5 mm, not extending  between the tops of two mucosal folds) esophagitis with no bleeding was  found at the gastroesophageal junction. The entire examined stomach was normal. There is no endoscopic evidence of bleeding in the stomach. Thick dark bilious material was found in the duodenal however the  examined duodenum was normal. There is no endoscopic evidence of bleeding in the entire examined  duodenum.  Impression: - LA Grade B reflux esophagitis. - Normal stomach. - Normal examined duodenum. - No endoscopic explanation for melena in the upper GI  tract. - No specimens collected. Recommendation: - Continue care in MICU - Perform a colonoscopy tomorrow. - Prep with 4L golytely starting now, consider placement  of NGT while patient remains intubated for adminstration  of bowel prep - Patient does not need to remain intubated from a GI  perspective at this point. - Clear liquids only prior  to colonoscopy, must be NPO for  2 hours prior to procedure - Trend H/H and transfuse as needed to maintain Hb>7 - Further recommendations may be made by inpatient GI team. - Put patient on a clear liquid diet starting today.   Findings: The perianal and digital  rectal examinations were normal. Hematin (altered blood/coffee-ground-like material) was found in the  entire colon. There is no endoscopic evidence of fresh bleeding, mass or ulcerations  in the entire colon. The exam was otherwise without abnormality on direct and retroflexion  views.  Impression: - hematin (old blood) in the entire examined colon. - The examination was otherwise normal on direct and  retroflexion views. - No specimens collected. - No evidence of active bleeding. Recommendation: - Further recommendations can be made by the GI consult  team. Please contact them if necessary. - Return patient to hospital ward for ongoing care. - Resume previous diet. - Continue PPI BID - If pt shows signs of brisk, active bleeding would obtain  nuclear medicine bleeding scan as prior bleeding site has  been likely localized to small bowel. - No repeat colonoscopy.  During her hospitalization she had a capsule endoscopy.  Summary and recommendations: Poor esophageal motility, capsule remained in the esophagus for 2-1/2 hours.  Gastritis.  Normal small bowel to extent reached.  Capsule did not reach cecum.  Recommendations: Consider esophageal manometry if develops dysphagia.  Continue PPI.  Obtain KUB to ensure PillCam reach the colon.  Hematology consult.  She reports today that she started having bright red blood in her stool on Wednesday, this was reported to her by her caregivers.  She is not sure the amount.  She is not sure when her last bowel movement was.  Again, as noted above, she is a poor historian.  She was receiving dialysis when I saw her this afternoon, she reported she started having mid  abdominal pain 1 hour prior to my visit, described as sharp, constant, had not eased off as of yet.  She denies having this pain before.  She reports that she is continuing to have fatigue, but reports it is getting worse.  She denies any nausea or vomiting.  Past Medical History:   Past Medical History  Diagnosis Date  . Fibromyalgia   . Chronic headaches     Seeing Pain Management  . Hyperlipidemia   . Psychosis   . Hypothyroid   . Narcolepsy   . Asthma   . Constipation   . Kidney failure     Currently on Dialysis  . Blind   . Epilepsy (HCC)   . History of DVT (deep vein thrombosis)   . History of CVA (cerebrovascular accident)   . Depression   . Bipolar affect, depressed (HCC)   . Anxiety     Problem List: Patient Active Problem List   Diagnosis Date Noted  . GI bleed 03/04/2016  . Generalized weakness 03/04/2016  . Hypothyroidism 03/04/2016  . GERD (gastroesophageal reflux disease) 03/04/2016  . ESRD on dialysis (HCC) 03/04/2016  . Bipolar 1 disorder, mixed, moderate (HCC) 02/26/2016  . 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 Anemia 02/25/2016    Past Surgical History: Past Surgical History  Procedure Laterality Date  . Brain surgery  1976  . Cholecystectomy  2001  . Breast lumpectomy  2001  . Pituitary excision  1976  . Dg av dialysis graft declot or      X 4  . Peripheral  vascular catheterization N/A 08/21/2015    Procedure: Dialysis/Perma Catheter Insertion;  Surgeon: Renford Dills, MD;  Location: [facility] INVASIVE CV LAB;  Service: Cardiovascular;  Laterality: N/A;  . Peripheral vascular catheterization N/A 02/29/2016    Procedure: Dialysis/Perma Catheter Removal;  Surgeon: Annice Needy, MD;  Location: [facility] INVASIVE CV LAB;  Service: Cardiovascular;  Laterality: N/A;    Allergies: Allergies  Allergen Reactions  . Dhea [Nutritional Supplements] Anaphylaxis    Patient states medication is DHE for migraines, not DHEA  . Demerol [Meperidine] Other (See Comments)    Reaction:  Hallucinations   . Floxin [Ofloxacin] Other (See Comments)    Reaction:  Hallucinations   . Nsaids Nausea And Vomiting and Swelling  . Nubain [Nalbuphine Hcl] Other (See Comments)    Reaction:   Hallucinations   . Phenergan [Promethazine Hcl] Other (See Comments)    Reaction:  Restless legs   . Stadol [Butorphanol] Other (See Comments)    Reaction:  Hallucinations   . Erythromycin Diarrhea and Rash    Home Medications: Prescriptions prior to admission  Medication Sig Dispense Refill Last Dose  . albuterol (PROVENTIL HFA;VENTOLIN HFA) 108 (90 BASE) MCG/ACT inhaler Inhale 2 puffs into the lungs every 6 (six) hours as needed for wheezing or shortness of breath.   PRN at PRN  . albuterol (PROVENTIL) (2.5 MG/3ML) 0.083% nebulizer solution Take 2.5 mg by nebulization every 6 (six) hours as needed for wheezing or shortness of breath.   PRN at PRN  . budesonide-formoterol (SYMBICORT) 80-4.5 MCG/ACT inhaler Inhale 2 puffs into the lungs 2 (two) times daily.   unknown at unknown  . cyclobenzaprine (FLEXERIL) 10 MG tablet Take 10 mg by mouth 3 (three) times daily as needed for muscle spasms.   PRN at PRN  . Emollient (EQL ADVANCED RECOVERY) LOTN Apply 1 application topically 2 (two) times daily.   unknown at unknown  . estradiol (ESTRACE) 0.1 MG/GM vaginal cream Place 1 Applicatorful vaginally 2 (two) times a week.   unknown at unknown  . fenofibrate (TRICOR) 145 MG tablet Take 145 mg by mouth every other day.    unknown at unknown  . fludrocortisone (FLORINEF) 0.1 MG tablet Take 0.1 mg by mouth daily.   unknown at unknown  . folic acid-vitamin b complex-vitamin c-selenium-zinc (DIALYVITE) 3 MG TABS tablet Take 1 tablet by mouth daily.   unknown at unknown  . furosemide (LASIX) 40 MG tablet Take 40 mg by mouth 2 (two) times daily.   unknown at unknown  . gabapentin (NEURONTIN) 100 MG capsule Take 400 mg by mouth at bedtime.   unknown at unknown  . guaiFENesin (MUCINEX) 600 MG 12 hr tablet Take 1 tablet (600 mg total) by mouth 2 (two) times daily as needed. (Patient taking differently: Take 600 mg by mouth 2 (two) times daily as needed for cough or to loosen phlegm. ) 60 tablet 2 PRN at PRN  .  guaifenesin (ROBITUSSIN) 100 MG/5ML syrup Take 200 mg by mouth every 6 (six) hours as needed for cough.   PRN at PRN  . hydrocortisone (CORTEF) 10 MG tablet Take 5-10 mg by mouth 2 (two) times daily. Pt takes 10 mg in the morning and 5 mg at bedtime.   unknown at unknown  . HYDROmorphone (DILAUDID) 4 MG tablet Take 6 mg by mouth every 6 (six) hours as needed for severe pain.    unknown at unknown  . hydrOXYzine (ATARAX/VISTARIL) 25 MG tablet Take 25 mg by mouth 3 (three) times daily.  unknown at unknown  . levothyroxine (SYNTHROID, LEVOTHROID) 112 MCG tablet Take 112 mcg by mouth daily.    unknown at unknown  . loratadine (CLARITIN) 10 MG tablet Take 10 mg by mouth daily.   unknown at unknown  . lovastatin (MEVACOR) 40 MG tablet Take 20 mg by mouth every evening.    unknown at unknown  . Melatonin 5 MG TABS Take 2 tablets by mouth at bedtime.   unknown at unknown  . methylphenidate (RITALIN) 20 MG tablet Take 20-30 mg by mouth 2 (two) times daily. Pt takes 30 mg in the morning and 20 mg at lunch.   unknown at unknown  . Multiple Vitamin (MULTIVITAMIN WITH MINERALS) TABS tablet Take 1 tablet by mouth daily.   unknown at unknown  . ondansetron (ZOFRAN-ODT) 8 MG disintegrating tablet Take 8 mg by mouth every 8 (eight) hours as needed for nausea or vomiting.   PRN at PRN  . Oxycodone HCl 10 MG TABS Take 10 mg by mouth every 8 (eight) hours as needed (for pain).    PRN at PRN  . pantoprazole (PROTONIX) 40 MG tablet Take 1 tablet (40 mg total) by mouth daily. 30 tablet 0 unknown at unknown  . polyethylene glycol (MIRALAX / GLYCOLAX) packet Take 17 g by mouth daily as needed for mild constipation.    PRN at PRN  . risperidone (RISPERDAL) 4 MG tablet Take 2-4 mg by mouth 2 (two) times daily. Pt takes 4 mg in the morning and 2 mg at bedtime.   unknown at unknown  . sertraline (ZOLOFT) 50 MG tablet Take 50 mg by mouth daily.   unknown at unknown  . sevelamer carbonate (RENVELA) 800 MG tablet Take 4,000 mg by  mouth 3 (three) times daily with meals.   unknown at unknown  . topiramate (TOPAMAX) 200 MG tablet Take 200 mg by mouth 2 (two) times daily. Pt takes with a 50 mg tablet.   unknown at unknown  . topiramate (TOPAMAX) 50 MG tablet Take 50 mg by mouth 2 (two) times daily. Pt takes with a 200 mg tablet.   unknown at unknown  . Vitamin D, Ergocalciferol, (DRISDOL) 50000 UNITS CAPS capsule Take 50,000 Units by mouth every 30 (thirty) days.   unknown at unknown   Home medication reconciliation was completed with the patient.   Scheduled Inpatient Medications:   . fludrocortisone  0.1 mg Oral Daily  . hydrocortisone  10 mg Oral Daily  . hydrocortisone  5 mg Oral QHS  . levothyroxine  112 mcg Oral QAC breakfast  . mometasone-formoterol  2 puff Inhalation BID  . pantoprazole  40 mg Oral Daily  . pravastatin  20 mg Oral q1800  . risperiDONE  2 mg Oral QHS  . risperiDONE  4 mg Oral q morning - 10a  . sertraline  50 mg Oral Daily  . sevelamer carbonate  4,000 mg Oral TID WC  . sodium chloride flush  3 mL Intravenous Q12H  . topiramate  200 mg Oral BID  . topiramate  50 mg Oral BID    Continuous Inpatient Infusions:     PRN Inpatient Medications:  sodium chloride, sodium chloride, acetaminophen **OR** acetaminophen, albuterol, alteplase, heparin, HYDROmorphone, lidocaine (PF), lidocaine-prilocaine, ondansetron **OR** ondansetron (ZOFRAN) IV, pentafluoroprop-tetrafluoroeth  Family History: family history includes Aneurysm in her mother; Diabetes in her father; Heart attack in her mother; Migraines in her mother.    Social History:   reports that she has been smoking Cigarettes.  She has a  8 pack-year smoking history. She does not have any smokeless tobacco history on file. She reports that she does not drink alcohol or use illicit drugs.   Review of Systems: Constitutional: Weight is stable.  Eyes:Legally blind ENT: No oral lesions, sore throat.  GI: see HPI.  Heme/Lymph: No easy bruising.   CV: No chest pain.  GU: No hematuria.  Integumentary: No rashes.  Neuro: No headaches.  Psych: No depression/anxiety.  Endocrine: No heat/cold intolerance.  Allergic/Immunologic: No urticaria.  Resp: No cough, SOB.  Musculoskeletal: No joint swelling.    Physical Examination: BP 147/66 mmHg  Pulse 75  Temp(Src) 98.2 F (36.8 C) (Oral)  Resp 17  Ht 4\' 10"  (1.473 m)  Wt 75 kg (165 lb 5.5 oz)  BMI 34.57 kg/m2  SpO2 100%  LMP 11/21/2007 (Approximate) Gen: NAD, alert and oriented x 4 HEENT: Patient is blind Neck: supple, no JVD or thyromegaly Chest: CTA bilaterally, no wheezes, crackles, or other adventitious sounds CV: RRR, no m/g/c/r Abd: soft, generalized tenderness, ND, +BS in all four quadrants; no HSM, guarding, ridigity, or rebound tenderness Ext: no edema, well perfused with 2+ pulses, Skin: no rash or lesions noted Lymph: no LAD  Data: Lab Results  Component Value Date   WBC 5.7 03/03/2016   HGB 7.8* 03/03/2016   HCT 24.2* 03/03/2016   MCV 95.9 03/03/2016   PLT 156 03/03/2016    Recent Labs Lab 02/28/16 0537 02/29/16 0547 03/03/16 2248  HGB 8.1* 8.3* 7.8*   Lab Results  Component Value Date   NA 136 03/04/2016   K 3.6 03/04/2016   CL 100* 03/04/2016   CO2 28 03/04/2016   BUN 25* 03/04/2016   CREATININE 4.55* 03/04/2016   Lab Results  Component Value Date   ALT 14 03/04/2016   AST 32 03/04/2016   ALKPHOS 117 03/04/2016   BILITOT 0.7 03/04/2016   No results for input(s): APTT, INR, PTT in the last 168 hours.   Assessment/Plan: Ms. Heyer is a 45 y.o. female with anemia, bright red blood per rectum.  Hemoglobin on discharge on 03/01/2016 was 8.3, on admission today 7.8.  She did have increased ammonia on admission of 62, this is back within normal level. Rest of liver panel WNL.  Negative capsule endoscopy, previous negative upper endoscopy and colonoscopy in August 2016.  KUB ordered to make sure capsule has reached the  colon   Recommendations: KUB results pending.  No other GI evaluations at this time.  Recommend hematology consult.  We will continue to follow with you.  Thank you for the consult. Please call with questions or concerns.  Carney Harder, PA-C  I personally performed these services.

## 2016-03-04 NOTE — NC FL2 (Signed)
[place] MEDICAID FL2 LEVEL OF CARE SCREENING TOOL     IDENTIFICATION  Patient Name: Erin [name] Birthdate: 23-Oct-1971 Sex: female Admission Date (Current Location): 03/03/2016  [place] and [place] Number:  [address]  [id number] [facility]   Facility and Address:  [facility], [address], [place], [place] [address]      Provider Number: [id number]  Attending Physician Name and Address:  [name], MD  Relative Name and Phone Number:       Current Level of Care: Hospital Recommended Level of Care: Assisted Living Facility Prior Approval Number:    Date Approved/Denied:   PASRR Number:  ( [id number] )  Discharge Plan: Domiciliary (Rest home)    Current Diagnoses: Patient Active Problem List   Diagnosis Date Noted  . GI bleed 03/04/2016  . Generalized weakness 03/04/2016  . Hypothyroidism 03/04/2016  . GERD (gastroesophageal reflux disease) 03/04/2016  . ESRD on dialysis (HCC) 03/04/2016  . Bipolar 1 disorder, mixed, moderate (HCC) 02/26/2016  . 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 Anemia 02/25/2016    Orientation RESPIRATION BLADDER Height & Weight     Self, Time, Situation, Place  Normal Continent Weight: 164 lb (74.39 kg) Height:  4\' 10"  (147.3 cm)  BEHAVIORAL SYMPTOMS/MOOD NEUROLOGICAL BOWEL NUTRITION STATUS   (none )  (none ) Continent Diet (NPO )  AMBULATORY STATUS COMMUNICATION OF NEEDS Skin   Supervision Verbally Normal                       Personal Care Assistance Level of Assistance  Bathing, Feeding, Dressing Bathing Assistance: Independent Feeding assistance: Independent Dressing Assistance: Independent     Functional Limitations Info  Sight, Hearing, Speech Sight Info: Impaired (Patient is blind ) Hearing Info: Adequate Speech Info: Adequate    SPECIAL CARE FACTORS FREQUENCY   Dialysis   M,W,F at 11:15 am at [facility] in [place]                   Contractures      Additional Factors Info  Code Status, Allergies, Psychotropic Code Status Info:  (Full Code. ) Allergies Info:  (Dhea, Demerol, Floxin, Nsaids, Nubain, Phenergan, Stadol, Erythromycin) Psychotropic Info:  (Risperdal )         Current Medications (03/04/2016):  This is the current hospital active medication list Current Facility-Administered Medications  Medication Dose Route Frequency Provider Last Rate Last Dose  . acetaminophen (TYLENOL) tablet 650 mg  650 mg Oral Q6H PRN Oralia Manis, MD       Or  . acetaminophen (TYLENOL) suppository 650 mg  650 mg Rectal Q6H PRN Oralia Manis, MD      . albuterol (PROVENTIL) (2.5 MG/3ML) 0.083% nebulizer solution 3 mL  3 mL Inhalation Q6H PRN Oralia Manis, MD      . fludrocortisone (FLORINEF) tablet 0.1 mg  0.1 mg Oral Daily Oralia Manis, MD   0.1 mg at 03/04/16 5643  . hydrocortisone (CORTEF) tablet 10 mg  10 mg Oral Daily Oralia Manis, MD   10 mg at 03/04/16 3295  . hydrocortisone (CORTEF) tablet 5 mg  5 mg Oral QHS Oralia Manis, MD      . HYDROmorphone (DILAUDID) tablet 6 mg  6 mg Oral Q6H PRN Oralia Manis, MD   6 mg at 03/04/16 0650  . levothyroxine (SYNTHROID, LEVOTHROID) tablet 112 mcg  112 mcg Oral QAC breakfast Oralia Manis, MD   112 mcg at 03/04/16 0920  .  mometasone-formoterol (DULERA) 100-5 MCG/ACT inhaler 2 puff  2 puff Inhalation BID Oralia Manis, MD   2 puff at 03/04/16 0920  . ondansetron (ZOFRAN) tablet 4 mg  4 mg Oral Q6H PRN Oralia Manis, MD       Or  . ondansetron [facility]) injection 4 mg  4 mg Intravenous Q6H PRN Oralia Manis, MD      . pantoprazole (PROTONIX) EC tablet 40 mg  40 mg Oral Daily Oralia Manis, MD   40 mg at 03/04/16 1610  . pravastatin (PRAVACHOL) tablet 20 mg  20 mg Oral q1800 Oralia Manis, MD      . risperiDONE (RISPERDAL) tablet 2 mg  2 mg Oral QHS Oralia Manis, MD      . risperiDONE (RISPERDAL) tablet 4 mg  4 mg Oral q morning - 10a Oralia Manis, MD   4 mg at 03/04/16 9604  .  sertraline (ZOLOFT) tablet 50 mg  50 mg Oral Daily Oralia Manis, MD   50 mg at 03/04/16 5409  . sevelamer carbonate (RENVELA) tablet 4,000 mg  4,000 mg Oral TID WC Oralia Manis, MD   4,000 mg at 03/04/16 0800  . sodium chloride flush (NS) 0.9 % injection 3 mL  3 mL Intravenous Q12H Oralia Manis, MD   3 mL at 03/04/16 0926  . topiramate (TOPAMAX) tablet 200 mg  200 mg Oral BID Oralia Manis, MD   200 mg at 03/04/16 8119  . topiramate (TOPAMAX) tablet 50 mg  50 mg Oral BID Oralia Manis, MD   50 mg at 03/04/16 1478     Discharge Medications: Please see discharge summary for a list of discharge medications.  Relevant Imaging Results:  Relevant Lab Results:   Additional Information  SSN: [id number]  Haig Prophet, LCSW

## 2016-03-04 NOTE — Progress Notes (Signed)
Pre-hd tx 

## 2016-03-04 NOTE — Care Management (Signed)
Received telephone call from Tammy at [facility] [phone]) States that Ms. Neighbors had Encapulated studies done this week. Physician was GI person at [facility]. Gwenette Greet RN MSN CCM Care Management [phone]

## 2016-03-04 NOTE — Progress Notes (Signed)
Patient bending access arm in dialysis causing machine to alarm,educated patient on keeping access arm straight,patient stated "I have to send text,I'm not going to sit here for three hours with my arm still,I don;t care if it infiltrates,my dialysis will just be done then."

## 2016-03-04 NOTE — Care Management Obs Status (Signed)
MEDICARE OBSERVATION STATUS NOTIFICATION   Patient Details  Name: Erin [name] MRN: [id number] Date of Birth: 19-Aug-1971   Medicare Observation Status Notification Given:  Yes    Gwenette Greet, RN 03/04/2016, 3:05 PM

## 2016-03-04 NOTE — H&P (Addendum)
[facility] [place] at [facility]   PATIENT NAME: Erin [name]    MR#:  [id number]  DATE OF BIRTH:  Jan 02, 1971  DATE OF ADMISSION:  03/03/2016  PRIMARY CARE PHYSICIAN: Pcp Not In System   REQUESTING/REFERRING PHYSICIAN: Manson Passey, MD  CHIEF COMPLAINT:   Chief Complaint  Patient presents with  . Dizziness    HISTORY OF PRESENT ILLNESS:  Erin [name]  is a 45 y.o. female who presents with Dizziness, generalized weakness, blood in stool. Patient was just hospitalized here, during this stay received a couple units of blood. On the day prior to presentation in the ED she noted blood in her stool. She developed generalized weakness and dizziness. This persisted into the following day, and she decided to come to the ED for evaluation. Here she was found to have a hemoglobin largest stable from her prior values of 7.8, but her ammonia level is mildly elevated without any history of liver disease. She is also guaiac positive in the ED. Hospitalists were called for admission.  PAST MEDICAL HISTORY:   Past Medical History  Diagnosis Date  . Fibromyalgia   . Chronic headaches     Seeing Pain Management  . Hyperlipidemia   . Psychosis   . Hypothyroid   . Narcolepsy   . Asthma   . Constipation   . Kidney failure     Currently on Dialysis  . Blind   . Epilepsy (HCC)   . History of DVT (deep vein thrombosis)   . History of CVA (cerebrovascular accident)   . Depression   . Bipolar affect, depressed (HCC)   . Anxiety     PAST SURGICAL HISTORY:   Past Surgical History  Procedure Laterality Date  . Brain surgery  1976  . Cholecystectomy  2001  . Breast lumpectomy  2001  . Pituitary excision  1976  . Dg av dialysis graft declot or      X 4  . Peripheral vascular catheterization N/A 08/21/2015    Procedure: Dialysis/Perma Catheter Insertion;  Surgeon: Renford Dills, MD;  Location: [facility] INVASIVE CV LAB;  Service: Cardiovascular;  Laterality: N/A;  .  Peripheral vascular catheterization N/A 02/29/2016    Procedure: Dialysis/Perma Catheter Removal;  Surgeon: Annice Needy, MD;  Location: [facility] INVASIVE CV LAB;  Service: Cardiovascular;  Laterality: N/A;    SOCIAL HISTORY:   Social History  Substance Use Topics  . Smoking status: Current Every Day Smoker -- 0.50 packs/day for 16 years    Types: Cigarettes  . Smokeless tobacco: Not on file  . Alcohol Use: No     Comment: occasionally    FAMILY HISTORY:   Family History  Problem Relation Age of Onset  . Diabetes Father   . Heart attack Mother   . Migraines Mother   . Aneurysm Mother     DRUG ALLERGIES:   Allergies  Allergen Reactions  . Dhea [Nutritional Supplements] Anaphylaxis    Patient states medication is DHE for migraines, not DHEA  . Demerol [Meperidine] Other (See Comments)    Reaction:  Hallucinations   . Floxin [Ofloxacin] Other (See Comments)    Reaction:  Hallucinations   . Nsaids Nausea And Vomiting and Swelling  . Nubain [Nalbuphine Hcl] Other (See Comments)    Reaction:  Hallucinations   . Phenergan [Promethazine Hcl] Other (See Comments)    Reaction:  Restless legs   . Stadol [Butorphanol] Other (See Comments)    Reaction:  Hallucinations   . Erythromycin Diarrhea  and Rash    MEDICATIONS AT HOME:   Prior to Admission medications   Medication Sig Start Date End Date Taking? Authorizing Provider  albuterol (PROVENTIL HFA;VENTOLIN HFA) 108 (90 BASE) MCG/ACT inhaler Inhale 2 puffs into the lungs every 6 (six) hours as needed for wheezing or shortness of breath.   Yes Historical Provider, MD  albuterol (PROVENTIL) (2.5 MG/3ML) 0.083% nebulizer solution Take 2.5 mg by nebulization every 6 (six) hours as needed for wheezing or shortness of breath.   Yes Historical Provider, MD  budesonide-formoterol (SYMBICORT) 80-4.5 MCG/ACT inhaler Inhale 2 puffs into the lungs 2 (two) times daily.   Yes Historical Provider, MD  cyclobenzaprine (FLEXERIL) 10 MG tablet Take 10  mg by mouth 3 (three) times daily as needed for muscle spasms.   Yes Historical Provider, MD  Emollient (EQL ADVANCED RECOVERY) LOTN Apply 1 application topically 2 (two) times daily.   Yes Historical Provider, MD  estradiol (ESTRACE) 0.1 MG/GM vaginal cream Place 1 Applicatorful vaginally 2 (two) times a week.   Yes Historical Provider, MD  fenofibrate (TRICOR) 145 MG tablet Take 145 mg by mouth every other day.    Yes Historical Provider, MD  fludrocortisone (FLORINEF) 0.1 MG tablet Take 0.1 mg by mouth daily.   Yes Historical Provider, MD  folic acid-vitamin b complex-vitamin c-selenium-zinc (DIALYVITE) 3 MG TABS tablet Take 1 tablet by mouth daily.   Yes Historical Provider, MD  furosemide (LASIX) 40 MG tablet Take 40 mg by mouth 2 (two) times daily.   Yes Historical Provider, MD  gabapentin (NEURONTIN) 100 MG capsule Take 400 mg by mouth at bedtime.   Yes Historical Provider, MD  guaiFENesin (MUCINEX) 600 MG 12 hr tablet Take 1 tablet (600 mg total) by mouth 2 (two) times daily as needed. Patient taking differently: Take 600 mg by mouth 2 (two) times daily as needed for cough or to loosen phlegm.  11/04/15 11/03/16 Yes Darci Current, MD  guaifenesin (ROBITUSSIN) 100 MG/5ML syrup Take 200 mg by mouth every 6 (six) hours as needed for cough.   Yes Historical Provider, MD  hydrocortisone (CORTEF) 10 MG tablet Take 5-10 mg by mouth 2 (two) times daily. Pt takes 10 mg in the morning and 5 mg at bedtime.   Yes Historical Provider, MD  HYDROmorphone (DILAUDID) 4 MG tablet Take 6 mg by mouth every 6 (six) hours as needed for severe pain.    Yes Historical Provider, MD  hydrOXYzine (ATARAX/VISTARIL) 25 MG tablet Take 25 mg by mouth 3 (three) times daily.   Yes Historical Provider, MD  levothyroxine (SYNTHROID, LEVOTHROID) 112 MCG tablet Take 112 mcg by mouth daily.    Yes Historical Provider, MD  loratadine (CLARITIN) 10 MG tablet Take 10 mg by mouth daily.   Yes Historical Provider, MD  lovastatin  (MEVACOR) 40 MG tablet Take 20 mg by mouth every evening.    Yes Historical Provider, MD  Melatonin 5 MG TABS Take 2 tablets by mouth at bedtime.   Yes Historical Provider, MD  methylphenidate (RITALIN) 20 MG tablet Take 20-30 mg by mouth 2 (two) times daily. Pt takes 30 mg in the morning and 20 mg at lunch.   Yes Historical Provider, MD  Multiple Vitamin (MULTIVITAMIN WITH MINERALS) TABS tablet Take 1 tablet by mouth daily.   Yes Historical Provider, MD  ondansetron (ZOFRAN-ODT) 8 MG disintegrating tablet Take 8 mg by mouth every 8 (eight) hours as needed for nausea or vomiting.   Yes Historical Provider, MD  Oxycodone HCl  10 MG TABS Take 10 mg by mouth every 8 (eight) hours as needed (for pain).    Yes Historical Provider, MD  pantoprazole (PROTONIX) 40 MG tablet Take 1 tablet (40 mg total) by mouth daily. 02/27/16  Yes [name], MD  polyethylene glycol (MIRALAX / GLYCOLAX) packet Take 17 g by mouth daily as needed for mild constipation.    Yes Historical Provider, MD  risperidone (RISPERDAL) 4 MG tablet Take 2-4 mg by mouth 2 (two) times daily. Pt takes 4 mg in the morning and 2 mg at bedtime.   Yes Historical Provider, MD  sertraline (ZOLOFT) 50 MG tablet Take 50 mg by mouth daily.   Yes Historical Provider, MD  sevelamer carbonate (RENVELA) 800 MG tablet Take 4,000 mg by mouth 3 (three) times daily with meals.   Yes Historical Provider, MD  topiramate (TOPAMAX) 200 MG tablet Take 200 mg by mouth 2 (two) times daily. Pt takes with a 50 mg tablet.   Yes Historical Provider, MD  topiramate (TOPAMAX) 50 MG tablet Take 50 mg by mouth 2 (two) times daily. Pt takes with a 200 mg tablet.   Yes Historical Provider, MD  Vitamin D, Ergocalciferol, (DRISDOL) 50000 UNITS CAPS capsule Take 50,000 Units by mouth every 30 (thirty) days.   Yes Historical Provider, MD    REVIEW OF SYSTEMS:  Review of Systems  Constitutional: Positive for malaise/fatigue. Negative for fever, chills and weight loss.  HENT:  Negative for ear pain, hearing loss and tinnitus.   Eyes: Negative for blurred vision, double vision, pain and redness.  Respiratory: Negative for cough, hemoptysis and shortness of breath.   Cardiovascular: Negative for chest pain, palpitations, orthopnea and leg swelling.  Gastrointestinal: Positive for blood in stool. Negative for nausea, vomiting, abdominal pain, diarrhea and constipation.  Genitourinary: Negative for dysuria, frequency and hematuria.  Musculoskeletal: Negative for back pain, joint pain and neck pain.  Skin:       No acne, rash, or lesions  Neurological: Positive for weakness. Negative for dizziness, tremors and focal weakness.  Endo/Heme/Allergies: Negative for polydipsia. Does not bruise/bleed easily.  Psychiatric/Behavioral: Negative for depression. The patient is not nervous/anxious and does not have insomnia.      VITAL SIGNS:   Filed Vitals:   03/04/16 0130 03/04/16 0200 03/04/16 0230 03/04/16 0300  BP: 124/55 115/50 119/54 117/59  Pulse: 56 55 55 54  Temp:      TempSrc:      Resp: 9 11 12 12   Height:      Weight:      SpO2: 98% 97% 98% 98%   Wt Readings from Last 3 Encounters:  03/03/16 74.39 kg (164 lb)  02/29/16 73 kg (160 lb 15 oz)  02/18/16 79 kg (174 lb 2.6 oz)    PHYSICAL EXAMINATION:  Physical Exam  Vitals reviewed. Constitutional: She is oriented to person, place, and time. She appears well-developed and well-nourished. No distress.  HENT:  Head: Normocephalic and atraumatic.  Mouth/Throat: Oropharynx is clear and moist.  Eyes: Conjunctivae are normal. Pupils are equal, round, and reactive to light. No scleral icterus.  Patient is blind  Neck: Normal range of motion. Neck supple. No JVD present. No thyromegaly present.  Cardiovascular: Normal rate, regular rhythm and intact distal pulses.  Exam reveals no gallop and no friction rub.   No murmur heard. Respiratory: Effort normal and breath sounds normal. No respiratory distress. She has  no wheezes. She has no rales.  GI: Soft. Bowel sounds are normal. She  exhibits no distension. There is no tenderness.  Musculoskeletal: Normal range of motion. She exhibits no edema.  No arthritis, no gout  Lymphadenopathy:    She has no cervical adenopathy.  Neurological: She is alert and oriented to person, place, and time. No cranial nerve deficit.  No dysarthria, no aphasia  Skin: Skin is warm and dry. No rash noted. No erythema.  Psychiatric: She has a normal mood and affect. Her behavior is normal. Judgment and thought content normal.    LABORATORY PANEL:   CBC  Recent Labs Lab 03/03/16 2248  WBC 5.7  HGB 7.8*  HCT 24.2*  PLT 156   ------------------------------------------------------------------------------------------------------------------  Chemistries   Recent Labs Lab 03/03/16 2248  NA 136  K 3.1*  CL 99*  CO2 30  GLUCOSE 99  BUN 22*  CREATININE 4.14*  CALCIUM 7.3*  AST 36  ALT 14  ALKPHOS 132*  BILITOT 0.9   ------------------------------------------------------------------------------------------------------------------  Cardiac Enzymes  Recent Labs Lab 03/03/16 2248  TROPONINI <0.03   ------------------------------------------------------------------------------------------------------------------  RADIOLOGY:  Ct Head Wo Contrast  03/04/2016  CLINICAL DATA:  Dizziness and weakness beginning yesterday. History of stroke, seizures, psychosis, chronic headaches, hyperlipidemia, pituitary surgery. EXAM: CT HEAD WITHOUT CONTRAST TECHNIQUE: Contiguous axial images were obtained from the base of the skull through the vertex without intravenous contrast. COMPARISON:  CT HEAD October 23, 2015 FINDINGS: Mild motion degraded examination. INTRACRANIAL CONTENTS: The ventricles and sulci are normal. No intraparenchymal hemorrhage, mass effect nor midline shift. Stable RIGHT frontal encephalomalacia. No acute large vascular territory infarcts. No abnormal  extra-axial fluid collections. Basal cisterns are patent. ORBITS: The included ocular globes and orbital contents are normal. SINUSES: RIGHT sphenoid sinus mucosal thickening. Mastoid air cells are well aerated. SKULL/SOFT TISSUES: No skull fracture. Old RIGHT frontal craniotomy. No significant soft tissue swelling. IMPRESSION: No acute intracranial process on this motion degraded examination. Acute RIGHT sphenoid sinusitis. 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 Electronically Signed   By: Awilda Metro M.D.   On: 03/04/2016 01:17    EKG:   Orders placed or performed during the hospital encounter of 03/03/16  . EKG 12-Lead  . EKG 12-Lead    IMPRESSION AND PLAN:  Principal Problem:   GI bleed - Hemoglobin is stable. We will continue home dose PPI, and get a GI consult to examine possible further diagnostic workup. Active Problems:   Generalized weakness - possibly related to GI bleed and anemia, though these things seem to be stable at this time.   Bipolar 1 disorder, mixed, moderate (HCC) - continue home meds   Hypothyroidism - home dose thyroid replacement   GERD (gastroesophageal reflux disease) - home dose PPI   ESRD on Dialysis - nephrology consult for dialysis support  All the records are reviewed and case discussed with ED provider. Management plans discussed with the patient and/or family.  DVT PROPHYLAXIS: Mechanical only  GI PROPHYLAXIS: PPI  ADMISSION STATUS: Inpatient  CODE STATUS: Full Code Status History    Date Active Date Inactive Code Status Order ID Comments User Context   02/25/2016  3:36 PM 03/01/2016  3:06 PM Full Code [id number]  Enedina Finner, MD Inpatient      TOTAL TIME TAKING CARE OF THIS PATIENT: 45 minutes.    Leni Pankonin FIELDING 03/04/2016, 4:05 AM  [facility]  Office  [phone]  CC: Primary care physician; Pcp Not In System

## 2016-03-04 NOTE — Progress Notes (Signed)
Patient ended tx 2.5hours early due to stomach pains,Dr.Lateef notified via telephone.GI P.A. Bedside talking with patient at end of tx.

## 2016-03-04 NOTE — Progress Notes (Signed)
Post hd tx 

## 2016-03-04 NOTE — Progress Notes (Signed)
patientatient with recent hospitalization presents with possible GI bleed  GI bleed - Hemoglobin is stable. We will continue home dose PPI, and get a GI consult   Generalized weakness - possibly related to GI bleed and anemia,PT eval  Bipolar 1 disorder, mixed, moderate (HCC) - continue home meds  Hypothyroidism - home dose thyroid replacement  GERD (gastroesophageal reflux disease) - home dose PPI  ESRD on Dialysis - nephrology consult for dialysis support

## 2016-03-04 NOTE — Care Management (Signed)
Admitted to [facility] with the diagnosis of GI Bleed. Discharged from this facility to [facility] this week. A resident of [facility] since January 2017. Friend is Dorethea Clan [phone]). An established  hemodialysis at [facility] in [place].  NPO Hgb 7.8 Potassium 3.1. Nephrology consult Gwenette Greet RN MSN CCM Care Management [phone]

## 2016-03-04 NOTE — Consult Note (Signed)
Patient had capsule study completed on 5/1 with Dr. Shelle Iron.  Findings: Poor esophageal motility (capsule remained in esophagus for 2.5 hours) Gastritis.  Normal small bowel to extent reached.  Capsule did not reach cecum.  Recommendations: Consider esophageal manometry if develops dysphagia Continue PPI Obtain KUB to ensure pill cam reached the colon Hematology consult   Full consult note to follow

## 2016-03-04 NOTE — Care Management Obs Status (Signed)
MEDICARE OBSERVATION STATUS NOTIFICATION   Patient Details  Name: Erin [name] MRN: [id number] Date of Birth: 10-14-1971   Medicare Observation Status Notification Given:  Yes    Gwenette Greet, RN 03/04/2016, 3:07 PM

## 2016-03-04 NOTE — Progress Notes (Signed)
Subjective:   patient was discharged earlier this week. She comes back now with dizziness and was found to have blood in the stool. Ammonia level also high. Patient last had dialysis on Wednesday. She is due for dialysis again today.    Objective:  Vital signs in last 24 hours:  Temp:  [98.3 F (36.8 C)-98.4 F (36.9 C)] 98.4 F (36.9 C) (05/05 0919) Pulse Rate:  [52-68] 56 (05/05 0919) Resp:  [9-20] 18 (05/05 0919) BP: (115-150)/(50-67) 135/52 mmHg (05/05 0919) SpO2:  [92 %-100 %] 100 % (05/05 0919) Weight:  [74.39 kg (164 lb)] 74.39 kg (164 lb) (05/04 2241)  Weight change:  Filed Weights   03/03/16 2241  Weight: 74.39 kg (164 lb)    Intake/Output:   No intake or output data in the 24 hours ending 03/04/16 1058   Physical Exam: General: NAD, laying in bed  HEENT Anicteric, moist mucus membranes  Neck supple  Pulm/lungs Clear, room air, normal effort  CVS/Heart Regular, no rub, harsh systolic murmur  Abdomen:  Soft, non tender, BS present  Extremities: trace peripheral edema  Neurologic: Alert, able to follow commands  Skin: No acute rashes  Access: Right arm AV graft       Basic Metabolic Panel:   Recent Labs Lab 02/27/16 0603 02/29/16 0547 03/03/16 2248 03/04/16 0809  NA 141 139 136 136  K 3.3* 3.6 3.1* 3.6  CL 101 102 99* 100*  CO2 30 29 30 28   GLUCOSE 79 73 99 71  BUN 12 26* 22* 25*  CREATININE 2.11* 4.83* 4.14* 4.55*  CALCIUM 7.9* 7.8* 7.3* 7.3*     CBC:  Recent Labs Lab 02/26/16 2333 02/27/16 0603 02/27/16 1016 02/28/16 0537 02/29/16 0547 03/03/16 2248  WBC  --  6.6  --  6.6 6.7 5.7  HGB 8.7* 8.5* 7.9* 8.1* 8.3* 7.8*  HCT 25.8* 25.0*  --  24.3* 24.9* 24.2*  MCV  --  92.4  --  95.6 96.2 95.9  PLT  --  196  --  182 177 156      Microbiology:  Recent Results (from the past 720 hour(s))  MRSA PCR Screening     Status: None   Collection Time: 02/26/16  6:02 AM  Result Value Ref Range Status   MRSA by PCR NEGATIVE NEGATIVE  Final    Comment:        The GeneXpert MRSA Assay (FDA approved for NASAL specimens only), is one component of a comprehensive MRSA colonization surveillance program. It is not intended to diagnose MRSA infection nor to guide or monitor treatment for MRSA infections.     Coagulation Studies: No results for input(s): LABPROT, INR in the last 72 hours.  Urinalysis: No results for input(s): COLORURINE, LABSPEC, PHURINE, GLUCOSEU, HGBUR, BILIRUBINUR, KETONESUR, PROTEINUR, UROBILINOGEN, NITRITE, LEUKOCYTESUR in the last 72 hours.  Invalid input(s): APPERANCEUR    Imaging: Ct Head Wo Contrast  03/04/2016  CLINICAL DATA:  Dizziness and weakness beginning yesterday. History of stroke, seizures, psychosis, chronic headaches, hyperlipidemia, pituitary surgery. EXAM: CT HEAD WITHOUT CONTRAST TECHNIQUE: Contiguous axial images were obtained from the base of the skull through the vertex without intravenous contrast. COMPARISON:  CT HEAD October 23, 2015 FINDINGS: Mild motion degraded examination. INTRACRANIAL CONTENTS: The ventricles and sulci are normal. No intraparenchymal hemorrhage, mass effect nor midline shift. Stable RIGHT frontal encephalomalacia. No acute large vascular territory infarcts. No abnormal extra-axial fluid collections. Basal cisterns are patent. ORBITS: The included ocular globes and orbital contents are normal. SINUSES: RIGHT  sphenoid sinus mucosal thickening. Mastoid air cells are well aerated. SKULL/SOFT TISSUES: No skull fracture. Old RIGHT frontal craniotomy. No significant soft tissue swelling. IMPRESSION: No acute intracranial process on this motion degraded examination. Acute RIGHT sphenoid sinusitis. 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 Electronically Signed   By: Awilda Metro M.D.   On: 03/04/2016 01:17     Medications:     . fludrocortisone  0.1 mg Oral Daily  . hydrocortisone  10 mg Oral Daily  . hydrocortisone  5 mg Oral QHS  .  levothyroxine  112 mcg Oral QAC breakfast  . mometasone-formoterol  2 puff Inhalation BID  . pantoprazole  40 mg Oral Daily  . pravastatin  20 mg Oral q1800  . risperiDONE  2 mg Oral QHS  . risperiDONE  4 mg Oral q morning - 10a  . sertraline  50 mg Oral Daily  . sevelamer carbonate  4,000 mg Oral TID WC  . sodium chloride flush  3 mL Intravenous Q12H  . topiramate  200 mg Oral BID  . topiramate  50 mg Oral BID   acetaminophen **OR** acetaminophen, albuterol, HYDROmorphone, ondansetron **OR** ondansetron (ZOFRAN) IV  Assessment/ Plan:  45 y.o. 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 ESRD- [facility]/ MWF-2/ [facility] - atient last had dialysis on Wednesday.  She has acute blood loss now.  We plan to perform dialysis but with an ultrafiltration target of only 1 kg.  2. AoCKD and blood loss - patient had recent capsule endoscopy.  Would recommend gastroenterology consultation again.  We will start the patient on a pigeon 10,000 units IV with dialysis.  3. SHPTH - check intact PTH and phosphorus with dialysis today.  Continue Renvela for now.     LOS: 0 Shaketa Serafin 5/5/201710:58 AM

## 2016-03-04 NOTE — Progress Notes (Signed)
Hemodialysis tx started,patient in bed.

## 2016-03-04 NOTE — Care Management Important Message (Signed)
Important Message  Patient Details  Name: SAMREET HEGGIE MRN: [id number] Date of Birth: 03-31-1971   Medicare Important Message Given:  Yes    Gwenette Greet, RN 03/04/2016, 8:32 AM

## 2016-03-04 NOTE — Care Management (Signed)
Code 44 letter issued. [name] said her stomach was hurting too much to sign at this time. Gwenette Greet RN MSN CCM Care management [phone]

## 2016-03-04 NOTE — Clinical Social Work Note (Addendum)
Clinical Social Work Assessment  Patient Details  Name: Erin [name] MRN: [id number] Date of Birth: Feb 28, 1971  Date of referral:  03/04/16               Reason for consult:  Other (Comment Required) (From [facility] )                Permission sought to share information with:  Facility Art therapist granted to share information::  Yes, Verbal Permission Granted  Name::      [facility]   Agency::     Relationship::     Contact Information:     Housing/Transportation Living arrangements for the past 2 months:  [place] of Information:  Patient Patient Interpreter Needed:  None Criminal Activity/Legal Involvement Pertinent to Current Situation/Hospitalization:  No - Comment as needed Significant Relationships:  Friend Lives with:  Facility Resident Do you feel safe going back to the place where you live?  Yes Need for family participation in patient care:  Yes (Comment)  Care giving concerns:  Patient is a resident at [facility]. (fax: [phone])    Social Worker assessment / plan:  Holiday representative ([place]) received verbal consult from RN Case Manager that patient is from [facility]. Patient is a readmit and was discharged from [facility] days ago. CSW met with patient alone at bedside. Patient was alert and oriented and answered questions appropriately. Patient reported that she has lived at [facility] since December 2016. Patient reported that she goes to Dialysis M,W,F at 11:15 am at [facility] in [place]. Patient reported that she plans on returning to [facility] when she is stable for D/C. Per patient she does not have a HPOA. [facility] liaison is aware of admission.   FL2 complete. CSW attempted to call [facility] Headings and [employer] however they did not answer and a voicemail was left. CSW will continue to follow and assist as needed.  CSW received call back from [facility].  [facility] Headings confirmed that patient is a long term care resident and can return to [facility] when stable. Per [facility] Headings patient does ambulate without an assistive device however she does use a cane on occasion.   Employment status:  Disabled (Comment on whether or not currently receiving Disability) Insurance information:  Medicaid [place] PT Recommendations:  Not assessed at this time Information / Referral to community resources:  Other (Comment Required) (Patient will return to ALF )  Patient/Family's Response to care:  Patient is agreeable to return to [facility].   Patient/Family's Understanding of and Emotional Response to Diagnosis, Current Treatment, and Prognosis:  Patient was pleasant and thanked CSW for visit.   Emotional Assessment Appearance:  Appears stated age Attitude/Demeanor/Rapport:    Affect (typically observed):  Accepting, Adaptable, Pleasant Orientation:  Oriented to Self, Oriented to Place, Oriented to  Time, Oriented to Situation Alcohol / Substance use:  Not Applicable Psych involvement (Current and /or in the community):  No (Comment)  Discharge Needs  Concerns to be addressed:  Discharge Planning Concerns Readmission within the last 30 days:  Yes Current discharge risk:  Chronically ill Barriers to Discharge:  Continued Medical Work up   Loralyn Freshwater, LCSW 03/04/2016, 9:49 AM

## 2016-03-04 NOTE — ED Provider Notes (Signed)
[facility] Emergency Department Provider Note  ____________________________________________  Time seen: 11:15PM  I have reviewed the triage vital signs and the nursing notes.   HISTORY  Chief Complaint Dizziness      HPI Erin [name] is a 45 y.o. female with history of below listed medical condition presents with acute onset of progressive dizziness and generalized weakness today. Patient states that she had a bowel movement today with bright red blood noted. Of note patient recently had a hospital admission for anemia requiring blood transfusion. She denies any pain no nausea or vomiting. Patient denies any fever.    Past Medical History  Diagnosis Date  . Fibromyalgia   . Chronic headaches     Seeing Pain Management  . Hyperlipidemia   . Psychosis   . Hypothyroid   . Narcolepsy   . Asthma   . Constipation   . Kidney failure     Currently on Dialysis  . Blind   . Epilepsy (HCC)   . History of DVT (deep vein thrombosis)   . History of CVA (cerebrovascular accident)   . Depression   . Bipolar affect, depressed (HCC)   . Anxiety     Patient Active Problem List   Diagnosis Date Noted  . Bipolar 1 disorder, mixed, moderate (HCC) 02/26/2016  . 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 Anemia 02/25/2016    Past Surgical History  Procedure Laterality Date  . Brain surgery  1976  . Cholecystectomy  2001  . Breast lumpectomy  2001  . Pituitary excision  1976  . Dg av dialysis graft declot or      X 4  . Peripheral vascular catheterization N/A 08/21/2015    Procedure: Dialysis/Perma Catheter Insertion;  Surgeon: Renford Dills, MD;  Location: [facility] INVASIVE CV LAB;  Service: Cardiovascular;  Laterality: N/A;  . Peripheral vascular catheterization N/A 02/29/2016    Procedure: Dialysis/Perma Catheter Removal;  Surgeon: Annice Needy, MD;  Location: [facility] INVASIVE CV LAB;   Service: Cardiovascular;  Laterality: N/A;    Current Outpatient Rx  Name  Route  Sig  Dispense  Refill  . albuterol (PROVENTIL HFA;VENTOLIN HFA) 108 (90 BASE) MCG/ACT inhaler   Inhalation   Inhale 2 puffs into the lungs every 6 (six) hours as needed for wheezing or shortness of breath.         Marland Kitchen albuterol (PROVENTIL) (2.5 MG/3ML) 0.083% nebulizer solution   Nebulization   Take 2.5 mg by nebulization every 6 (six) hours as needed for wheezing or shortness of breath.         . budesonide-formoterol (SYMBICORT) 80-4.5 MCG/ACT inhaler   Inhalation   Inhale 2 puffs into the lungs 2 (two) times daily.         . cyclobenzaprine (FLEXERIL) 10 MG tablet   Oral   Take 10 mg by mouth 3 (three) times daily as needed for muscle spasms.         . Emollient (EQL ADVANCED RECOVERY) LOTN   Apply externally   Apply 1 application topically 2 (two) times daily.         Marland Kitchen estradiol (ESTRACE) 0.1 MG/GM vaginal cream   Vaginal   Place 1 Applicatorful vaginally 2 (two) times a week.         . fenofibrate (TRICOR) 145 MG tablet   Oral   Take 145 mg by mouth every other day.          Marland Kitchen  fludrocortisone (FLORINEF) 0.1 MG tablet   Oral   Take 0.1 mg by mouth daily.         . folic acid-vitamin b complex-vitamin c-selenium-zinc (DIALYVITE) 3 MG TABS tablet   Oral   Take 1 tablet by mouth daily.         . furosemide (LASIX) 40 MG tablet   Oral   Take 40 mg by mouth 2 (two) times daily.         Marland Kitchen gabapentin (NEURONTIN) 100 MG capsule   Oral   Take 400 mg by mouth at bedtime.         Marland Kitchen guaiFENesin (MUCINEX) 600 MG 12 hr tablet   Oral   Take 1 tablet (600 mg total) by mouth 2 (two) times daily as needed. Patient taking differently: Take 600 mg by mouth 2 (two) times daily as needed for cough or to loosen phlegm.    60 tablet   2   . guaifenesin (ROBITUSSIN) 100 MG/5ML syrup   Oral   Take 200 mg by mouth every 6 (six) hours as needed for cough.         .  hydrocortisone (CORTEF) 10 MG tablet   Oral   Take 5-10 mg by mouth 2 (two) times daily. Pt takes 10 mg in the morning and 5 mg at bedtime.         Marland Kitchen HYDROmorphone (DILAUDID) 4 MG tablet   Oral   Take 6 mg by mouth every 6 (six) hours as needed for severe pain.          . hydrOXYzine (ATARAX/VISTARIL) 25 MG tablet   Oral   Take 25 mg by mouth 3 (three) times daily.         Marland Kitchen levothyroxine (SYNTHROID, LEVOTHROID) 112 MCG tablet   Oral   Take 112 mcg by mouth daily.          Marland Kitchen loratadine (CLARITIN) 10 MG tablet   Oral   Take 10 mg by mouth daily.         Marland Kitchen lovastatin (MEVACOR) 40 MG tablet   Oral   Take 20 mg by mouth every evening.          . Melatonin 5 MG TABS   Oral   Take 2 tablets by mouth at bedtime.         . methylphenidate (RITALIN) 20 MG tablet   Oral   Take 20-30 mg by mouth 2 (two) times daily. Pt takes 30 mg in the morning and 20 mg at lunch.         . Multiple Vitamin (MULTIVITAMIN WITH MINERALS) TABS tablet   Oral   Take 1 tablet by mouth daily.         . ondansetron (ZOFRAN-ODT) 8 MG disintegrating tablet   Oral   Take 8 mg by mouth every 8 (eight) hours as needed for nausea or vomiting.         . Oxycodone HCl 10 MG TABS   Oral   Take 10 mg by mouth every 8 (eight) hours as needed (for pain).          . pantoprazole (PROTONIX) 40 MG tablet   Oral   Take 1 tablet (40 mg total) by mouth daily.   30 tablet   0   . polyethylene glycol (MIRALAX / GLYCOLAX) packet   Oral   Take 17 g by mouth daily as needed for mild constipation.          Marland Kitchen  risperidone (RISPERDAL) 4 MG tablet   Oral   Take 2-4 mg by mouth 2 (two) times daily. Pt takes 4 mg in the morning and 2 mg at bedtime.         . sertraline (ZOLOFT) 50 MG tablet   Oral   Take 50 mg by mouth daily.         . sevelamer carbonate (RENVELA) 800 MG tablet   Oral   Take 4,000 mg by mouth 3 (three) times daily with meals.         . topiramate (TOPAMAX) 200 MG  tablet   Oral   Take 200 mg by mouth 2 (two) times daily. Pt takes with a 50 mg tablet.         . topiramate (TOPAMAX) 50 MG tablet   Oral   Take 50 mg by mouth 2 (two) times daily. Pt takes with a 200 mg tablet.         . Vitamin D, Ergocalciferol, (DRISDOL) 50000 UNITS CAPS capsule   Oral   Take 50,000 Units by mouth every 30 (thirty) days.           Allergies Dhea; Demerol; Floxin; Nsaids; Nubain; Phenergan; Stadol; and Erythromycin  Family History  Problem Relation Age of Onset  . Diabetes Father   . Heart attack Mother   . Migraines Mother   . Aneurysm Mother     Social History Social History  Substance Use Topics  . Smoking status: Current Every Day Smoker -- 0.50 packs/day for 16 years    Types: Cigarettes  . Smokeless tobacco: None  . Alcohol Use: No     Comment: occasionally    Review of Systems  Constitutional: Negative for fever. Eyes: Negative for visual changes. ENT: Negative for sore throat. Cardiovascular: Negative for chest pain. Respiratory: Negative for shortness of breath. Gastrointestinal: Negative for abdominal pain, vomiting and diarrhea. Genitourinary: Negative for dysuria. Musculoskeletal: Negative for back pain. Skin: Negative for rash. Neurological: Positive for dizziness  10-point ROS otherwise negative.  ____________________________________________   PHYSICAL EXAM:  VITAL SIGNS: ED Triage Vitals  Enc Vitals Group     BP 03/03/16 2241 126/64 mmHg     Pulse Rate 03/03/16 2241 58     Resp 03/03/16 2241 18     Temp 03/03/16 2241 98.4 F (36.9 C)     Temp Source 03/03/16 2241 Oral     SpO2 03/03/16 2241 100 %     Weight 03/03/16 2241 164 lb (74.39 kg)     Height 03/03/16 2241 4\' 10"  (1.473 m)     Head Cir --      Peak Flow --      Pain Score 03/03/16 2242 10     Pain Loc --      Pain Edu? --      Excl. in GC? --     Constitutional: Alert and oriented. Well appearing and in no distress. Eyes: Conjunctivae are  normal. PERRL. Normal extraocular movements. ENT   Head: Normocephalic and atraumatic.   Nose: No congestion/rhinnorhea.   Mouth/Throat: Mucous membranes are moist.   Neck: No stridor. Hematological/Lymphatic/Immunilogical: No cervical lymphadenopathy. Cardiovascular: Normal rate, regular rhythm. Normal and symmetric distal pulses are present in all extremities. No murmurs, rubs, or gallops. Respiratory: Normal respiratory effort without tachypnea nor retractions. Breath sounds are clear and equal bilaterally. No wheezes/rales/rhonchi. Gastrointestinal: Soft and nontender. No distention. There is no CVA tenderness. Genitourinary: deferred Musculoskeletal: Nontender with normal range of motion in all extremities.  No joint effusions.  No lower extremity tenderness nor edema. Neurologic:  Normal speech and language. No gross focal neurologic deficits are appreciated. Speech is normal.  Skin:  Skin is warm, dry and intact. No rash noted. Positive for jaundice Psychiatric: Mood and affect are normal. Speech and behavior are normal. Patient exhibits appropriate insight and judgment.  ____________________________________________    LABS (pertinent positives/negatives)  Labs Reviewed  CBC - Abnormal; Notable for the following:    RBC 2.52 (*)    Hemoglobin 7.8 (*)    HCT 24.2 (*)    RDW 16.7 (*)    All other components within normal limits  COMPREHENSIVE METABOLIC PANEL - Abnormal; Notable for the following:    Potassium 3.1 (*)    Chloride 99 (*)    BUN 22 (*)    Creatinine, Ser 4.14 (*)    Calcium 7.3 (*)    Total Protein 5.1 (*)    Albumin 2.5 (*)    Alkaline Phosphatase 132 (*)    GFR calc non Af Amer 12 (*)    GFR calc Af Amer 14 (*)    All other components within normal limits  AMMONIA - Abnormal; Notable for the following:    Ammonia 62 (*)    All other components within normal limits  TROPONIN I     ____________________________________________   EKG  ED  ECG REPORT I, Coal N BROWN, the attending physician, personally viewed and interpreted this ECG.   Date: 03/04/2016  EKG Time: 10:40 PM  Rate: 61  Rhythm: Normal sinus rhythm  Axis: Normal  Intervals: Normal  ST&T Change: None   ____________________________________________    RADIOLOGY  CT Head Wo Contrast (Final result) Result time: 03/04/16 01:17:48   Final result by Rad Results In Interface (03/04/16 01:17:48)   Narrative:   CLINICAL DATA: Dizziness and weakness beginning yesterday. History of stroke, seizures, psychosis, chronic headaches, hyperlipidemia, pituitary surgery.  EXAM: CT HEAD WITHOUT CONTRAST  TECHNIQUE: Contiguous axial images were obtained from the base of the skull through the vertex without intravenous contrast.  COMPARISON: CT HEAD October 23, 2015  FINDINGS: Mild motion degraded examination.  INTRACRANIAL CONTENTS: The ventricles and sulci are normal. No intraparenchymal hemorrhage, mass effect nor midline shift. Stable RIGHT frontal encephalomalacia. No acute large vascular territory infarcts. No abnormal extra-axial fluid collections. Basal cisterns are patent.  ORBITS: The included ocular globes and orbital contents are normal.  SINUSES: RIGHT sphenoid sinus mucosal thickening. Mastoid air cells are well aerated.  SKULL/SOFT TISSUES: No skull fracture. Old RIGHT frontal craniotomy. No significant soft tissue swelling.  IMPRESSION: No acute intracranial process on this motion degraded examination.  Acute RIGHT sphenoid sinusitis.  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   Electronically Signed By: Awilda Metro M.D. On: 03/04/2016 01:17        ____________________________________________  _   INITIAL IMPRESSION / ASSESSMENT AND PLAN / ED COURSE  Pertinent labs & imaging results that were available during my care of the patient were reviewed by me and considered in my medical decision  making (see chart for details).    ____________________________________________   FINAL CLINICAL IMPRESSION(S) / ED DIAGNOSES  Final diagnoses:  Hyperammonemia (HCC)  Dizziness  Acute GI bleeding      Darci Current, MD 03/04/16 [phone]

## 2016-03-04 NOTE — Progress Notes (Signed)
Patient requested to end treatment early due to stomach pains and did not want any pain medication,just wanted to end tx,Dr.Lateef made aware.Patient educated on adverse effects of not completing prescribed treatment time,verbalized understanding.

## 2016-03-05 DIAGNOSIS — K922 Gastrointestinal hemorrhage, unspecified: Secondary | ICD-10-CM | POA: Diagnosis not present

## 2016-03-05 LAB — CBC WITH DIFFERENTIAL/PLATELET
BASOS ABS: 0.1 10*3/uL (ref 0–0.1)
Basophils Relative: 1 %
Eosinophils Absolute: 0.1 10*3/uL (ref 0–0.7)
Eosinophils Relative: 1 %
HEMATOCRIT: 27.5 % — AB (ref 35.0–47.0)
Hemoglobin: 9 g/dL — ABNORMAL LOW (ref 12.0–16.0)
LYMPHS PCT: 21 %
Lymphs Abs: 1.4 10*3/uL (ref 1.0–3.6)
MCH: 31.1 pg (ref 26.0–34.0)
MCHC: 32.6 g/dL (ref 32.0–36.0)
MCV: 95.4 fL (ref 80.0–100.0)
MONOS PCT: 6 %
Monocytes Absolute: 0.4 10*3/uL (ref 0.2–0.9)
Neutro Abs: 4.9 10*3/uL (ref 1.4–6.5)
Neutrophils Relative %: 71 %
Platelets: 141 10*3/uL — ABNORMAL LOW (ref 150–440)
RBC: 2.88 MIL/uL — AB (ref 3.80–5.20)
RDW: 17 % — ABNORMAL HIGH (ref 11.5–14.5)
WBC: 6.9 10*3/uL (ref 3.6–11.0)

## 2016-03-05 LAB — PARATHYROID HORMONE, INTACT (NO CA): PTH: 292 pg/mL — AB (ref 15–65)

## 2016-03-05 MED ORDER — EPOETIN ALFA 10000 UNIT/ML IJ SOLN
10000.0000 [IU] | INTRAMUSCULAR | Status: DC
  Administered 2016-03-07: 14:00:00 10000 [IU] via INTRAVENOUS

## 2016-03-05 MED ORDER — DOCUSATE SODIUM 100 MG PO CAPS
200.0000 mg | ORAL_CAPSULE | Freq: Two times a day (BID) | ORAL | Status: DC
  Administered 2016-03-05 – 2016-03-07 (×3): 200 mg via ORAL
  Filled 2016-03-05 (×4): qty 2

## 2016-03-05 MED ORDER — POLYETHYLENE GLYCOL 3350 17 G PO PACK
17.0000 g | PACK | Freq: Every day | ORAL | Status: DC
Start: 2016-03-05 — End: 2016-03-07
  Administered 2016-03-05 – 2016-03-06 (×2): 17 g via ORAL
  Filled 2016-03-05 (×4): qty 1

## 2016-03-05 MED ORDER — SENNA 8.6 MG PO TABS
2.0000 | ORAL_TABLET | Freq: Every day | ORAL | Status: DC
  Administered 2016-03-06: 09:00:00 17.2 mg via ORAL
  Filled 2016-03-05 (×2): qty 2

## 2016-03-05 NOTE — Progress Notes (Addendum)
[facility] - [place] at [facility]                                                                                                                                                                                            Patient Demographics   Erin [name], is a 45 y.o. female, DOB - 04-10-1971, ZOX:[id number]  Admit date - 03/03/2016   Admitting Physician Oralia Manis, MD  Outpatient Primary MD for the patient is Pcp Not In System   LOS - 1  Subjective:Patient complains of pain in her back. She states that she has not seen any further bleeding.      Review of Systems:   CONSTITUTIONAL: No documented fever. No fatigue, weakness. No weight gain, no weight loss.  EYES:Unable to see ENT: No tinnitus. No postnasal drip. No redness of the oropharynx.  RESPIRATORY: No cough, no wheeze, no hemoptysis. No dyspnea.  CARDIOVASCULAR: No chest pain. No orthopnea. No palpitations. No syncope.  GASTROINTESTINAL: No nausea, no vomiting or diarrhea. No abdominal pain. Positive melena or hematochezia.  GENITOURINARY: No dysuria or hematuria.  ENDOCRINE: No polyuria or nocturia. No heat or cold intolerance.  HEMATOLOGY: No anemia. No bruising. No bleeding.  INTEGUMENTARY: No rashes. No lesions.  MUSCULOSKELETAL: Complains of back pain NEUROLOGIC: No numbness, tingling, or ataxia. No seizure-type activity.  PSYCHIATRIC: No anxiety. No insomnia. No ADD.    Vitals:   Filed Vitals:   03/04/16 1615 03/04/16 2130 03/05/16 0500 03/05/16 1111  BP: 147/66 164/69 163/83 138/63  Pulse: 75 60 64 72  Temp: 98.2 F (36.8 C) 97.8 F (36.6 C) 98.3 F (36.8 C) 98.7 F (37.1 C)  TempSrc: Oral Oral Oral Oral  Resp: 17 18 18    Height:      Weight: 75 kg (165 lb 5.5 oz)     SpO2: 100% 98% 97% 98%    Wt Readings from Last 3 Encounters:  03/04/16 75 kg (165 lb 5.5 oz)  02/29/16 73 kg (160 lb 15 oz)  02/18/16 79 kg (174 lb 2.6 oz)     Intake/Output Summary (Last 24 hours)  at 03/05/16 1144 Last data filed at 03/05/16 0900  Gross per 24 hour  Intake    120 ml  Output    135 ml  Net    -15 ml    Physical Exam:   GENERAL: Pleasant-appearing in no apparent distress.  HEAD, EYES, EARS, NOSE AND THROAT: Atraumatic, normocephalic. Extraocular muscles are intact. Pupils equal and reactive to light. Sclerae anicteric. No conjunctival injection. No oro-pharyngeal erythema.  NECK: Supple. There is no jugular venous distention. No bruits, no lymphadenopathy,  no thyromegaly.  HEART: Regular rate and rhythm,. No murmurs, no rubs, no clicks.  LUNGS: Clear to auscultation bilaterally. No rales or rhonchi. No wheezes.  ABDOMEN: Soft, flat, nontender, nondistended. Has good bowel sounds. No hepatosplenomegaly appreciated.  EXTREMITIES: No evidence of any cyanosis, clubbing, or peripheral edema.  +2 pedal and radial pulses bilaterally.  NEUROLOGIC: The patient is alert, awake, and oriented x3 with no focal motor or sensory deficits appreciated bilaterally.  SKIN: Moist and warm with no rashes appreciated.  Psych: Not anxious, depressed LN: No inguinal LN enlargement    Antibiotics   Anti-infectives    None      Medications   Scheduled Meds: . docusate sodium  200 mg Oral BID  . [START ON 03/07/2016] epoetin (EPOGEN/PROCRIT) injection  10,000 Units Intravenous Q M,W,F-HD  . fludrocortisone  0.1 mg Oral Daily  . hydrocortisone  10 mg Oral Daily  . hydrocortisone  5 mg Oral QHS  . levothyroxine  112 mcg Oral QAC breakfast  . mometasone-formoterol  2 puff Inhalation BID  . pantoprazole  40 mg Oral Daily  . polyethylene glycol  17 g Oral Daily  . pravastatin  20 mg Oral q1800  . risperiDONE  2 mg Oral QHS  . risperiDONE  4 mg Oral q morning - 10a  . senna  2 tablet Oral Daily  . sertraline  50 mg Oral Daily  . sevelamer carbonate  4,000 mg Oral TID WC  . sodium chloride flush  3 mL Intravenous Q12H  . topiramate  200 mg Oral BID  . topiramate  50 mg Oral BID    Continuous Infusions:  PRN Meds:.acetaminophen **OR** acetaminophen, albuterol, HYDROmorphone, ondansetron **OR** ondansetron (ZOFRAN) IV   Data Review:   Micro Results Recent Results (from the past 240 hour(s))  MRSA PCR Screening     Status: None   Collection Time: 02/26/16  6:02 AM  Result Value Ref Range Status   MRSA by PCR NEGATIVE NEGATIVE Final    Comment:        The GeneXpert MRSA Assay (FDA approved for NASAL specimens only), is one component of a comprehensive MRSA colonization surveillance program. It is not intended to diagnose MRSA infection nor to guide or monitor treatment for MRSA infections.     Radiology Reports Dg Abd 1 View  03/04/2016  CLINICAL DATA:  Evaluation for endoscopic capsule location EXAM: ABDOMEN - 1 VIEW COMPARISON:  None. FINDINGS: Capsule is located in the mid-pelvis. It is difficult to ascertain whether this capsule is with the in sigmoid colon versus distal ileum based on single view only acquired. There is fairly diffuse stool throughout the colon. There is no bowel dilatation or air-fluid level suggesting obstruction. No free air. There are surgical clips in the right upper quadrant. IMPRESSION: Capsule in mid pelvis. On single supine examination, it is difficult to ascertain whether the capsule is in the ileum versus descending colon. Bowel gas pattern normal. Diffuse stool throughout colon. Electronically Signed   By: Bretta Bang III M.D.   On: 03/04/2016 18:11   Ct Head Wo Contrast  03/04/2016  CLINICAL DATA:  Dizziness and weakness beginning yesterday. History of stroke, seizures, psychosis, chronic headaches, hyperlipidemia, pituitary surgery. EXAM: CT HEAD WITHOUT CONTRAST TECHNIQUE: Contiguous axial images were obtained from the base of the skull through the vertex without intravenous contrast. COMPARISON:  CT HEAD October 23, 2015 FINDINGS: Mild motion degraded examination. INTRACRANIAL CONTENTS: The ventricles and sulci are  normal. No intraparenchymal hemorrhage, mass effect nor  midline shift. Stable RIGHT frontal encephalomalacia. No acute large vascular territory infarcts. No abnormal extra-axial fluid collections. Basal cisterns are patent. ORBITS: The included ocular globes and orbital contents are normal. SINUSES: RIGHT sphenoid sinus mucosal thickening. Mastoid air cells are well aerated. SKULL/SOFT TISSUES: No skull fracture. Old RIGHT frontal craniotomy. No significant soft tissue swelling. IMPRESSION: No acute intracranial process on this motion degraded examination. Acute RIGHT sphenoid sinusitis. 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 Electronically Signed   By: Awilda Metro M.D.   On: 03/04/2016 01:17   US Venous Img Lower Unilateral Right  02/18/2016  CLINICAL DATA:  Right leg pain. Pain in the foot last week, calf pain onset today. EXAM: RIGHT LOWER EXTREMITY VENOUS DOPPLER ULTRASOUND TECHNIQUE: Gray-scale sonography with graded compression, as well as color Doppler and duplex ultrasound were performed to evaluate the lower extremity deep venous systems from the level of the common femoral vein and including the common femoral, femoral, profunda femoral, popliteal and calf veins including the posterior tibial, peroneal and gastrocnemius veins when visible. The superficial great saphenous vein was also interrogated. Spectral Doppler was utilized to evaluate flow at rest and with distal augmentation maneuvers in the common femoral, femoral and popliteal veins. COMPARISON:  None. FINDINGS: Contralateral Common Femoral Vein: Respiratory phasicity is normal and symmetric with the symptomatic side. No evidence of thrombus. Normal compressibility. Common Femoral Vein: No evidence of thrombus. Normal compressibility, respiratory phasicity and response to augmentation. Saphenofemoral Junction: No evidence of thrombus. Normal compressibility and flow on color Doppler imaging. Profunda Femoral Vein: No  evidence of thrombus. Normal compressibility and flow on color Doppler imaging. Femoral Vein: No evidence of thrombus. Normal compressibility, respiratory phasicity and response to augmentation. Popliteal Vein: No evidence of thrombus. Normal compressibility, respiratory phasicity and response to augmentation. Calf Veins: No evidence of thrombus. Normal compressibility and flow on color Doppler imaging. Superficial Great Saphenous Vein: No evidence of thrombus. Normal compressibility and flow on color Doppler imaging. Venous Reflux:  None. Other Findings:  None. IMPRESSION: No evidence of right lower extremity deep venous thrombosis. Electronically Signed   By: Rubye Oaks M.D.   On: 02/18/2016 21:36   Dg Chest Portable 1 View  02/25/2016  CLINICAL DATA:  Chest pain, hemodialysis EXAM: PORTABLE CHEST 1 VIEW COMPARISON:  11/03/2015 FINDINGS: Cardiomegaly again noted. Dual lumen left IJ catheter is unchanged in position. No acute infiltrate or pleural effusion. No pulmonary edema. IMPRESSION: No active disease. Electronically Signed   By: Natasha Mead M.D.   On: 02/25/2016 15:14     CBC  Recent Labs Lab 02/28/16 0537 02/29/16 0547 03/03/16 2248 03/05/16 1018  WBC 6.6 6.7 5.7 6.9  HGB 8.1* 8.3* 7.8* 9.0*  HCT 24.3* 24.9* 24.2* 27.5*  PLT 182 177 156 141*  MCV 95.6 96.2 95.9 95.4  MCH 31.7 31.9 31.0 31.1  MCHC 33.2 33.1 32.4 32.6  RDW 16.4* 15.9* 16.7* 17.0*  LYMPHSABS  --   --   --  PENDING  MONOABS  --   --   --  PENDING  EOSABS  --   --   --  PENDING  BASOSABS  --   --   --  PENDING    Chemistries   Recent Labs Lab 02/29/16 0547 03/03/16 2248 03/04/16 0809  NA 139 136 136  K 3.6 3.1* 3.6  CL 102 99* 100*  CO2 29 30 28   GLUCOSE 73 99 71  BUN 26* 22* 25*  CREATININE 4.83* 4.14* 4.55*  CALCIUM 7.8* 7.3* 7.3*  AST  --  36 32  ALT  --  14 14  ALKPHOS  --  132* 117  BILITOT  --  0.9 0.7    ------------------------------------------------------------------------------------------------------------------ estimated creatinine clearance is 13.9 mL/min (by C-G formula based on Cr of 4.55). ------------------------------------------------------------------------------------------------------------------ No results for input(s): HGBA1C in the last 72 hours. ------------------------------------------------------------------------------------------------------------------ No results for input(s): CHOL, HDL, LDLCALC, TRIG, CHOLHDL, LDLDIRECT in the last 72 hours. ------------------------------------------------------------------------------------------------------------------ No results for input(s): TSH, T4TOTAL, T3FREE, THYROIDAB in the last 72 hours.  Invalid input(s): FREET3 ------------------------------------------------------------------------------------------------------------------ No results for input(s): VITAMINB12, FOLATE, FERRITIN, TIBC, IRON, RETICCTPCT in the last 72 hours.  Coagulation profile No results for input(s): INR, PROTIME in the last 168 hours.  No results for input(s): DDIMER in the last 72 hours.  Cardiac Enzymes  Recent Labs Lab 03/03/16 2248  TROPONINI <0.03   ------------------------------------------------------------------------------------------------------------------ Invalid input(s): POCBNP    Assessment & Plan  Patient is a 45 year old recently discharged from the hospital admitted with GI bleed  1. GI bleed -hemoglobin stable over a GI evaluation 2.  Generalized weakness - possibly related to GI bleed and anemia,PT eval 3. Bipolar 1 disorder, mixed, moderate (HCC) - continue Risperdal and Zoloft 4.  Hypothyroidism - continue Synthroid 5.  GERD (gastroesophageal reflux disease) - continue Protonix 6.  ESRD on Dialysis - nephrology following for HD     Code Status Orders        Start     Ordered   03/04/16 0605  Full code    Continuous     03/04/16 0604    Code Status History    Date Active Date Inactive Code Status Order ID Comments User Context   02/25/2016  3:36 PM 03/01/2016  3:06 PM Full Code [id number]  Enedina Finner, MD Inpatient           Consults  GI   DVT Prophylaxis  SCD's  Lab Results  Component Value Date   PLT 141* 03/05/2016     Time Spent in minutes    Greater than 50% of time spent in care coordination and counseling patient regarding the condition and plan of care.   [name] M.D on 03/05/2016 at 11:44 AM  Between 7am to 6pm - Pager - [phone]  After 6pm go to [email or web address] - password [email or web address] [facility]  [facility]   Office  [phone]

## 2016-03-05 NOTE — Progress Notes (Signed)
Subjective:  Patient only had partial hemodialysis treatment yesterday. She was having abdominal pain and wanted to sign off. Patient was advised against this. We advised patient that we would be scheduling her for dialysis today given significantly shortened treatment. This a.m. she states that she does not want to have dialysis. Again patient counseled regarding this.    Objective:  Vital signs in last 24 hours:  Temp:  [97.8 F (36.6 C)-98.4 F (36.9 C)] 98.3 F (36.8 C) (05/06 0500) Pulse Rate:  [56-80] 64 (05/06 0500) Resp:  [15-32] 18 (05/06 0500) BP: (135-164)/(52-83) 163/83 mmHg (05/06 0500) SpO2:  [97 %-100 %] 97 % (05/06 0500) Weight:  [75 kg (165 lb 5.5 oz)] 75 kg (165 lb 5.5 oz) (05/05 1615)  Weight change: 0.61 kg (1 lb 5.5 oz) Filed Weights   03/03/16 2241 03/04/16 1440 03/04/16 1615  Weight: 74.39 kg (164 lb) 75 kg (165 lb 5.5 oz) 75 kg (165 lb 5.5 oz)    Intake/Output:    Intake/Output Summary (Last 24 hours) at 03/05/16 0818 Last data filed at 03/04/16 1615  Gross per 24 hour  Intake      0 ml  Output    135 ml  Net   -135 ml     Physical Exam: General: NAD, laying in bed  HEENT Anicteric, moist mucus membranes  Neck supple  Pulm/lungs Clear, room air, normal effort  CVS/Heart Regular, no rub, harsh systolic murmur  Abdomen:  Soft, non tender, BS present  Extremities: trace peripheral edema  Neurologic: Alert, able to follow commands  Skin: No acute rashes  Access: Right arm AV graft       Basic Metabolic Panel:   Recent Labs Lab 02/29/16 0547 03/03/16 2248 03/04/16 0809 03/04/16 1507  NA 139 136 136  --   K 3.6 3.1* 3.6  --   CL 102 99* 100*  --   CO2 29 30 28   --   GLUCOSE 73 99 71  --   BUN 26* 22* 25*  --   CREATININE 4.83* 4.14* 4.55*  --   CALCIUM 7.8* 7.3* 7.3*  --   PHOS  --   --   --  2.6     CBC:  Recent Labs Lab 02/27/16 1016 02/28/16 0537 02/29/16 0547 03/03/16 2248  WBC  --  6.6 6.7 5.7  HGB 7.9* 8.1*  8.3* 7.8*  HCT  --  24.3* 24.9* 24.2*  MCV  --  95.6 96.2 95.9  PLT  --  182 177 156      Microbiology:  Recent Results (from the past 720 hour(s))  MRSA PCR Screening     Status: None   Collection Time: 02/26/16  6:02 AM  Result Value Ref Range Status   MRSA by PCR NEGATIVE NEGATIVE Final    Comment:        The GeneXpert MRSA Assay (FDA approved for NASAL specimens only), is one component of a comprehensive MRSA colonization surveillance program. It is not intended to diagnose MRSA infection nor to guide or monitor treatment for MRSA infections.     Coagulation Studies: No results for input(s): LABPROT, INR in the last 72 hours.  Urinalysis: No results for input(s): COLORURINE, LABSPEC, PHURINE, GLUCOSEU, HGBUR, BILIRUBINUR, KETONESUR, PROTEINUR, UROBILINOGEN, NITRITE, LEUKOCYTESUR in the last 72 hours.  Invalid input(s): APPERANCEUR    Imaging: Dg Abd 1 View  03/04/2016  CLINICAL DATA:  Evaluation for endoscopic capsule location EXAM: ABDOMEN - 1 VIEW COMPARISON:  None. FINDINGS: Capsule is  located in the mid-pelvis. It is difficult to ascertain whether this capsule is with the in sigmoid colon versus distal ileum based on single view only acquired. There is fairly diffuse stool throughout the colon. There is no bowel dilatation or air-fluid level suggesting obstruction. No free air. There are surgical clips in the right upper quadrant. IMPRESSION: Capsule in mid pelvis. On single supine examination, it is difficult to ascertain whether the capsule is in the ileum versus descending colon. Bowel gas pattern normal. Diffuse stool throughout colon. Electronically Signed   By: Bretta Bang III M.D.   On: 03/04/2016 18:11   Ct Head Wo Contrast  03/04/2016  CLINICAL DATA:  Dizziness and weakness beginning yesterday. History of stroke, seizures, psychosis, chronic headaches, hyperlipidemia, pituitary surgery. EXAM: CT HEAD WITHOUT CONTRAST TECHNIQUE: Contiguous axial images  were obtained from the base of the skull through the vertex without intravenous contrast. COMPARISON:  CT HEAD October 23, 2015 FINDINGS: Mild motion degraded examination. INTRACRANIAL CONTENTS: The ventricles and sulci are normal. No intraparenchymal hemorrhage, mass effect nor midline shift. Stable RIGHT frontal encephalomalacia. No acute large vascular territory infarcts. No abnormal extra-axial fluid collections. Basal cisterns are patent. ORBITS: The included ocular globes and orbital contents are normal. SINUSES: RIGHT sphenoid sinus mucosal thickening. Mastoid air cells are well aerated. SKULL/SOFT TISSUES: No skull fracture. Old RIGHT frontal craniotomy. No significant soft tissue swelling. IMPRESSION: No acute intracranial process on this motion degraded examination. Acute RIGHT sphenoid sinusitis. 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 Electronically Signed   By: Awilda Metro M.D.   On: 03/04/2016 01:17     Medications:     . fludrocortisone  0.1 mg Oral Daily  . hydrocortisone  10 mg Oral Daily  . hydrocortisone  5 mg Oral QHS  . levothyroxine  112 mcg Oral QAC breakfast  . mometasone-formoterol  2 puff Inhalation BID  . pantoprazole  40 mg Oral Daily  . pravastatin  20 mg Oral q1800  . risperiDONE  2 mg Oral QHS  . risperiDONE  4 mg Oral q morning - 10a  . sertraline  50 mg Oral Daily  . sevelamer carbonate  4,000 mg Oral TID WC  . sodium chloride flush  3 mL Intravenous Q12H  . topiramate  200 mg Oral BID  . topiramate  50 mg Oral BID   acetaminophen **OR** acetaminophen, albuterol, HYDROmorphone, ondansetron **OR** ondansetron (ZOFRAN) IV  Assessment/ Plan:  45 y.o. 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 ESRD- Rain[facility] Graham/ MWF-2/ [facility] - Patient had a very shortened hemodialysis treatment yesterday. We advised her yesterday that we would be scheduled dialysis again today given shortened treatment. However patient declines treatment again today. Patient counseled on the dangers of this including continued GI bleeding given uremia.  2. AoCKD and blood loss - Hemoglobin 7.8. Continue Epogen with dialysis.  3. SHPTH - Phosphorous 2.6 and acceptable. Continue Renvela.     LOS: 1 Gershon Shorten 5/6/20178:18 AM

## 2016-03-05 NOTE — Progress Notes (Signed)
[facility] - [place] at [facility]                                                                                                                                                                                            Patient Demographics   Erin [name], is a 45 y.o. female, DOB - [birth year], ZOX:[id number]  Admit date - 03/03/2016   Admitting Physician Oralia Manis, MD  Outpatient Primary MD for the patient is Pcp Not In System   LOS - 1  Subjective  Called by patient she was very tearful and stated that she wanted to see a psychiatrist right away. I asked her why she reports that she wants to talk to somebody.    Review of Systems:   CONSTITUTIONAL: No documented fever. No fatigue, weakness. No weight gain, no weight loss.  EYES:Unable to see ENT: No tinnitus. No postnasal drip. No redness of the oropharynx.  RESPIRATORY: No cough, no wheeze, no hemoptysis. No dyspnea.  CARDIOVASCULAR: No chest pain. No orthopnea. No palpitations. No syncope.  GASTROINTESTINAL: No nausea, no vomiting or diarrhea. No abdominal pain. Positive melena or hematochezia.  GENITOURINARY: No dysuria or hematuria.  ENDOCRINE: No polyuria or nocturia. No heat or cold intolerance.  HEMATOLOGY: No anemia. No bruising. No bleeding.  INTEGUMENTARY: No rashes. No lesions.  MUSCULOSKELETAL: Complains of back pain NEUROLOGIC: No numbness, tingling, or ataxia. No seizure-type activity.  PSYCHIATRIC: Anxious   Vitals:   Filed Vitals:   03/04/16 1615 03/04/16 2130 03/05/16 0500 03/05/16 1111  BP: 147/66 164/69 163/83 138/63  Pulse: 75 60 64 72  Temp: 98.2 F (36.8 C) 97.8 F (36.6 C) 98.3 F (36.8 C) 98.7 F (37.1 C)  TempSrc: Oral Oral Oral Oral  Resp: 17 18 18    Height:      Weight: 75 kg (165 lb 5.5 oz)     SpO2: 100% 98% 97% 98%    Wt Readings from Last 3 Encounters:  03/04/16 75 kg (165 lb 5.5 oz)  02/29/16 73 kg (160 lb 15 oz)  02/18/16 79 kg (174 lb 2.6 oz)      Intake/Output Summary (Last 24 hours) at 03/05/16 1148 Last data filed at 03/05/16 0900  Gross per 24 hour  Intake    120 ml  Output    135 ml  Net    -15 ml    Physical Exam:   GENERAL: Pleasant-appearing in no apparent distress.  HEAD, EYES, EARS, NOSE AND THROAT: Atraumatic, normocephalic. Extraocular muscles are intact. Pupils equal and reactive to light. Sclerae anicteric. No conjunctival injection. No oro-pharyngeal erythema.  NECK: Supple. There is  no jugular venous distention. No bruits, no lymphadenopathy, no thyromegaly.  HEART: Regular rate and rhythm,. No murmurs, no rubs, no clicks.  LUNGS: Clear to auscultation bilaterally. No rales or rhonchi. No wheezes.  ABDOMEN: Soft, flat, nontender, nondistended. Has good bowel sounds. No hepatosplenomegaly appreciated.  EXTREMITIES: No evidence of any cyanosis, clubbing, or peripheral edema.  +2 pedal and radial pulses bilaterally.  NEUROLOGIC: The patient is alert, awake, and oriented x3 with no focal motor or sensory deficits appreciated bilaterally.  SKIN: Moist and warm with no rashes appreciated.  Psych: Anxious and tearful  LN: No inguinal LN enlargement    Antibiotics   Anti-infectives    None      Medications   Scheduled Meds: . docusate sodium  200 mg Oral BID  . [START ON 03/07/2016] epoetin (EPOGEN/PROCRIT) injection  10,000 Units Intravenous Q M,W,F-HD  . fludrocortisone  0.1 mg Oral Daily  . hydrocortisone  10 mg Oral Daily  . hydrocortisone  5 mg Oral QHS  . levothyroxine  112 mcg Oral QAC breakfast  . mometasone-formoterol  2 puff Inhalation BID  . pantoprazole  40 mg Oral Daily  . polyethylene glycol  17 g Oral Daily  . pravastatin  20 mg Oral q1800  . risperiDONE  2 mg Oral QHS  . risperiDONE  4 mg Oral q morning - 10a  . senna  2 tablet Oral Daily  . sertraline  50 mg Oral Daily  . sevelamer carbonate  4,000 mg Oral TID WC  . sodium chloride flush  3 mL Intravenous Q12H  . topiramate  200  mg Oral BID  . topiramate  50 mg Oral BID   Continuous Infusions:  PRN Meds:.acetaminophen **OR** acetaminophen, albuterol, HYDROmorphone, ondansetron **OR** ondansetron (ZOFRAN) IV   Data Review:   Micro Results Recent Results (from the past 240 hour(s))  MRSA PCR Screening     Status: None   Collection Time: 02/26/16  6:02 AM  Result Value Ref Range Status   MRSA by PCR NEGATIVE NEGATIVE Final    Comment:        The GeneXpert MRSA Assay (FDA approved for NASAL specimens only), is one component of a comprehensive MRSA colonization surveillance program. It is not intended to diagnose MRSA infection nor to guide or monitor treatment for MRSA infections.     Radiology Reports Dg Abd 1 View  03/04/2016  CLINICAL DATA:  Evaluation for endoscopic capsule location EXAM: ABDOMEN - 1 VIEW COMPARISON:  None. FINDINGS: Capsule is located in the mid-pelvis. It is difficult to ascertain whether this capsule is with the in sigmoid colon versus distal ileum based on single view only acquired. There is fairly diffuse stool throughout the colon. There is no bowel dilatation or air-fluid level suggesting obstruction. No free air. There are surgical clips in the right upper quadrant. IMPRESSION: Capsule in mid pelvis. On single supine examination, it is difficult to ascertain whether the capsule is in the ileum versus descending colon. Bowel gas pattern normal. Diffuse stool throughout colon. Electronically Signed   By: Bretta Bang III M.D.   On: 03/04/2016 18:11   Ct Head Wo Contrast  03/04/2016  CLINICAL DATA:  Dizziness and weakness beginning yesterday. History of stroke, seizures, psychosis, chronic headaches, hyperlipidemia, pituitary surgery. EXAM: CT HEAD WITHOUT CONTRAST TECHNIQUE: Contiguous axial images were obtained from the base of the skull through the vertex without intravenous contrast. COMPARISON:  CT HEAD October 23, 2015 FINDINGS: Mild motion degraded examination. INTRACRANIAL  CONTENTS: The ventricles and  sulci are normal. No intraparenchymal hemorrhage, mass effect nor midline shift. Stable RIGHT frontal encephalomalacia. No acute large vascular territory infarcts. No abnormal extra-axial fluid collections. Basal cisterns are patent. ORBITS: The included ocular globes and orbital contents are normal. SINUSES: RIGHT sphenoid sinus mucosal thickening. Mastoid air cells are well aerated. SKULL/SOFT TISSUES: No skull fracture. Old RIGHT frontal craniotomy. No significant soft tissue swelling. IMPRESSION: No acute intracranial process on this motion degraded examination. Acute RIGHT sphenoid sinusitis. 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 Electronically Signed   By: Awilda Metro M.D.   On: 03/04/2016 01:17   US Venous Img Lower Unilateral Right  02/18/2016  CLINICAL DATA:  Right leg pain. Pain in the foot last week, calf pain onset today. EXAM: RIGHT LOWER EXTREMITY VENOUS DOPPLER ULTRASOUND TECHNIQUE: Gray-scale sonography with graded compression, as well as color Doppler and duplex ultrasound were performed to evaluate the lower extremity deep venous systems from the level of the common femoral vein and including the common femoral, femoral, profunda femoral, popliteal and calf veins including the posterior tibial, peroneal and gastrocnemius veins when visible. The superficial great saphenous vein was also interrogated. Spectral Doppler was utilized to evaluate flow at rest and with distal augmentation maneuvers in the common femoral, femoral and popliteal veins. COMPARISON:  None. FINDINGS: Contralateral Common Femoral Vein: Respiratory phasicity is normal and symmetric with the symptomatic side. No evidence of thrombus. Normal compressibility. Common Femoral Vein: No evidence of thrombus. Normal compressibility, respiratory phasicity and response to augmentation. Saphenofemoral Junction: No evidence of thrombus. Normal compressibility and flow on color Doppler  imaging. Profunda Femoral Vein: No evidence of thrombus. Normal compressibility and flow on color Doppler imaging. Femoral Vein: No evidence of thrombus. Normal compressibility, respiratory phasicity and response to augmentation. Popliteal Vein: No evidence of thrombus. Normal compressibility, respiratory phasicity and response to augmentation. Calf Veins: No evidence of thrombus. Normal compressibility and flow on color Doppler imaging. Superficial Great Saphenous Vein: No evidence of thrombus. Normal compressibility and flow on color Doppler imaging. Venous Reflux:  None. Other Findings:  None. IMPRESSION: No evidence of right lower extremity deep venous thrombosis. Electronically Signed   By: Rubye Oaks M.D.   On: 02/18/2016 21:36   Dg Chest Portable 1 View  02/25/2016  CLINICAL DATA:  Chest pain, hemodialysis EXAM: PORTABLE CHEST 1 VIEW COMPARISON:  11/03/2015 FINDINGS: Cardiomegaly again noted. Dual lumen left IJ catheter is unchanged in position. No acute infiltrate or pleural effusion. No pulmonary edema. IMPRESSION: No active disease. Electronically Signed   By: Natasha Mead M.D.   On: 02/25/2016 15:14     CBC  Recent Labs Lab 02/28/16 0537 02/29/16 0547 03/03/16 2248 03/05/16 1018  WBC 6.6 6.7 5.7 6.9  HGB 8.1* 8.3* 7.8* 9.0*  HCT 24.3* 24.9* 24.2* 27.5*  PLT 182 177 156 141*  MCV 95.6 96.2 95.9 95.4  MCH 31.7 31.9 31.0 31.1  MCHC 33.2 33.1 32.4 32.6  RDW 16.4* 15.9* 16.7* 17.0*  LYMPHSABS  --   --   --  PENDING  MONOABS  --   --   --  PENDING  EOSABS  --   --   --  PENDING  BASOSABS  --   --   --  PENDING    Chemistries   Recent Labs Lab 02/29/16 0547 03/03/16 2248 03/04/16 0809  NA 139 136 136  K 3.6 3.1* 3.6  CL 102 99* 100*  CO2 29 30 28   GLUCOSE 73 99 71  BUN 26* 22* 25*  CREATININE 4.83* 4.14* 4.55*  CALCIUM 7.8* 7.3* 7.3*  AST  --  36 32  ALT  --  14 14  ALKPHOS  --  132* 117  BILITOT  --  0.9 0.7    ------------------------------------------------------------------------------------------------------------------ estimated creatinine clearance is 13.9 mL/min (by C-G formula based on Cr of 4.55). ------------------------------------------------------------------------------------------------------------------ No results for input(s): HGBA1C in the last 72 hours. ------------------------------------------------------------------------------------------------------------------ No results for input(s): CHOL, HDL, LDLCALC, TRIG, CHOLHDL, LDLDIRECT in the last 72 hours. ------------------------------------------------------------------------------------------------------------------ No results for input(s): TSH, T4TOTAL, T3FREE, THYROIDAB in the last 72 hours.  Invalid input(s): FREET3 ------------------------------------------------------------------------------------------------------------------ No results for input(s): VITAMINB12, FOLATE, FERRITIN, TIBC, IRON, RETICCTPCT in the last 72 hours.  Coagulation profile No results for input(s): INR, PROTIME in the last 168 hours.  No results for input(s): DDIMER in the last 72 hours.  Cardiac Enzymes  Recent Labs Lab 03/03/16 2248  TROPONINI <0.03   ------------------------------------------------------------------------------------------------------------------ Invalid input(s): POCBNP    Assessment & Plan  Patient is a 45 year old recently discharged from the hospital admitted with GI bleed  1. Anxiety discussed with the patient regarding her current reason for being very anxious. She was tearful she stated that the place she was staying at was had to many old people. She states that she does not want to be in a place where there are a lot of old people. I explained to her that she is going to an assisted living where majority of the people that are there are are elderly. Due to them requiring care and chronic medical issues. I also  assured her that we'll have case manager come discussed her living situation. Patient was receptive to this conversation. Lengthy time spent providing support and counseling to the patient.    Code Status Orders        Start     Ordered   03/04/16 1583  Full code   Continuous     03/04/16 0604    Code Status History    Date Active Date Inactive Code Status Order ID Comments User Context   02/25/2016  3:36 PM 03/01/2016  3:06 PM Full Code [id number]  Enedina Finner, MD Inpatient           Consults  GI   DVT Prophylaxis  SCD's  Lab Results  Component Value Date   PLT 141* 03/05/2016     Time Spent in minutes   Greater than 50% of time spent in care coordination and counseling patient regarding the condition and plan of care.   [name] M.D on 03/05/2016 at 11:48 AM  Between 7am to 6pm - Pager - [phone]  After 6pm go to [email or web address] - password [email or web address] [facility]  [facility] [place] [facility]   Office  [phone]

## 2016-03-05 NOTE — Consult Note (Signed)
GI Inpatient Follow-up Note  Patient Identification: Erin [name] is a 45 y.o. female with Anemia, bright red blood per rectum  Subjective: Patient reports she had 2 bowel movements today.  She was given MiraLAX, Colace, and senna earlier today.  She denies any abdominal pain.  Does complain of back pain.  Again patient is an unreliable historian.  She had a KUB completed yesterday to follow-up capsule study.  Capsule is located in the mid pelvis, radiology reports it is difficult to ascertain whether capsule is in the ileum versus descending colon.  Bowel gas pattern normal.  Diffuse stool throughout colon.  Scheduled Inpatient Medications:  . docusate sodium  200 mg Oral BID  . [START ON 03/07/2016] epoetin (EPOGEN/PROCRIT) injection  10,000 Units Intravenous Q M,W,F-HD  . fludrocortisone  0.1 mg Oral Daily  . hydrocortisone  10 mg Oral Daily  . hydrocortisone  5 mg Oral QHS  . levothyroxine  112 mcg Oral QAC breakfast  . mometasone-formoterol  2 puff Inhalation BID  . pantoprazole  40 mg Oral Daily  . polyethylene glycol  17 g Oral Daily  . pravastatin  20 mg Oral q1800  . risperiDONE  2 mg Oral QHS  . risperiDONE  4 mg Oral q morning - 10a  . senna  2 tablet Oral Daily  . sertraline  50 mg Oral Daily  . sevelamer carbonate  4,000 mg Oral TID WC  . sodium chloride flush  3 mL Intravenous Q12H  . topiramate  200 mg Oral BID  . topiramate  50 mg Oral BID    Continuous Inpatient Infusions:     PRN Inpatient Medications:  acetaminophen **OR** acetaminophen, albuterol, HYDROmorphone, ondansetron **OR** ondansetron (ZOFRAN) IV  Review of Systems: Constitutional: Weight is stable.  Eyes: patient is legally blind ENT: No oral lesions, sore throat.  GI: see HPI.  Heme/Lymph: No easy bruising.  CV: No chest pain.  GU: No hematuria.  Integumentary: No rashes.  Neuro: No headaches.  Psych: No depression/anxiety.  Endocrine: No heat/cold intolerance.   Allergic/Immunologic: No urticaria.  Resp: No cough.  Musculoskeletal: No joint swelling.    Physical Examination: BP 138/63 mmHg  Pulse 72  Temp(Src) 98.7 F (37.1 C) (Oral)  Resp 18  Ht  (1.473 m)  Wt 75 kg (165 lb 5.5 oz)  BMI 34.57 kg/m2  SpO2 98%  LMP 11/21/2007 (Approximate) Gen: NAD, alert and oriented x 4, unreliable historian HEENT: patient is legally blind Neck: supple, no JVD or thyromegaly Chest: CTA bilaterally, no wheezes, crackles, or other adventitious sounds CV: RRR, no m/g/c/r Abd: soft, generalized abdominal tenderness, ND, +BS in all four quadrants; no HSM, guarding, ridigity, or rebound tenderness Ext: no edema, well perfused with 2+ pulses, Skin: no rash or lesions noted Lymph: no LAD  Data: Lab Results  Component Value Date   WBC 6.9 03/05/2016   HGB 9.0* 03/05/2016   HCT 27.5* 03/05/2016   MCV 95.4 03/05/2016   PLT 141* 03/05/2016    Recent Labs Lab 02/29/16 0547 03/03/16 2248 03/05/16 1018  HGB 8.3* 7.8* 9.0*   Lab Results  Component Value Date   NA 136 03/04/2016   K 3.6 03/04/2016   CL 100* 03/04/2016   CO2 28 03/04/2016   BUN 25* 03/04/2016   CREATININE 4.55* 03/04/2016   Lab Results  Component Value Date   ALT 14 03/04/2016   AST 32 03/04/2016   ALKPHOS 117 03/04/2016   BILITOT 0.7 03/04/2016   No results for input(s):  APTT, INR, PTT in the last 168 hours.  Imaging: CLINICAL DATA: Evaluation for endoscopic capsule location  EXAM: ABDOMEN - 1 VIEW  COMPARISON: None.  FINDINGS: Capsule is located in the mid-pelvis. It is difficult to ascertain whether this capsule is with the in sigmoid colon versus distal ileum based on single view only acquired. There is fairly diffuse stool throughout the colon. There is no bowel dilatation or air-fluid level suggesting obstruction. No free air. There are surgical clips in the right upper quadrant.  IMPRESSION: Capsule in mid pelvis. On single supine examination,  it is difficult to ascertain whether the capsule is in the ileum versus descending colon. Bowel gas pattern normal. Diffuse stool throughout colon.   Electronically Signed  By: Bretta Bang III M.D.  On: 03/04/2016 18:11  Assessment/Plan: Ms. Bunn is a 45 y.o. female With anemia/bright red blood per rectum.  KUB completed yesterday shows capsule located in the mid pelvis area.  Hemoglobin improved to 9.0.  Patient reports she has had 2 bowel movements today.  She was given MiraLAX, senna, and Colace earlier today.  She is being treated with Protonix 40 mg once daily.  Recommendations: We recommend continuing to monitor her bowel movements, repeat KUB tomorrow, Meckal scan on Monday.  We will continue to follow with you  Please call with questions or concerns.  Carney Harder, PA-C  I personally performed these services.

## 2016-03-06 ENCOUNTER — Observation Stay: Payer: Medicare Other

## 2016-03-06 DIAGNOSIS — K922 Gastrointestinal hemorrhage, unspecified: Secondary | ICD-10-CM | POA: Diagnosis not present

## 2016-03-06 LAB — CBC
HCT: 24.6 % — ABNORMAL LOW (ref 35.0–47.0)
Hemoglobin: 8 g/dL — ABNORMAL LOW (ref 12.0–16.0)
MCH: 31.3 pg (ref 26.0–34.0)
MCHC: 32.6 g/dL (ref 32.0–36.0)
MCV: 96.2 fL (ref 80.0–100.0)
PLATELETS: 169 10*3/uL (ref 150–440)
RBC: 2.56 MIL/uL — AB (ref 3.80–5.20)
RDW: 17.4 % — AB (ref 11.5–14.5)
WBC: 11.7 10*3/uL — AB (ref 3.6–11.0)

## 2016-03-06 MED ORDER — LACTULOSE 10 GM/15ML PO SOLN
30.0000 g | Freq: Two times a day (BID) | ORAL | Status: DC
  Administered 2016-03-06: 30 g via ORAL
  Filled 2016-03-06 (×2): qty 60

## 2016-03-06 NOTE — Consult Note (Signed)
GI Inpatient Follow-up Note  Patient Identification: Erin [name] is a 45 y.o. female with anemia, bright red blood per rectum  Subjective: Patient reports she has had a couple of BM since my last visit.  She denies any abdominal pain at this time.  Reports she is still experiencing back pain, but reports that it is improving.  Xray showed that capsule is no longer present.  She is scheduled for a Meckel scan tomorrow.  Scheduled Inpatient Medications:  . docusate sodium  200 mg Oral BID  . [START ON 03/07/2016] epoetin (EPOGEN/PROCRIT) injection  10,000 Units Intravenous Q M,W,F-HD  . fludrocortisone  0.1 mg Oral Daily  . hydrocortisone  10 mg Oral Daily  . hydrocortisone  5 mg Oral QHS  . levothyroxine  112 mcg Oral QAC breakfast  . mometasone-formoterol  2 puff Inhalation BID  . pantoprazole  40 mg Oral Daily  . polyethylene glycol  17 g Oral Daily  . pravastatin  20 mg Oral q1800  . risperiDONE  2 mg Oral QHS  . risperiDONE  4 mg Oral q morning - 10a  . senna  2 tablet Oral Daily  . sertraline  50 mg Oral Daily  . sevelamer carbonate  4,000 mg Oral TID WC  . sodium chloride flush  3 mL Intravenous Q12H  . topiramate  200 mg Oral BID  . topiramate  50 mg Oral BID    Continuous Inpatient Infusions:     PRN Inpatient Medications:  acetaminophen **OR** acetaminophen, albuterol, HYDROmorphone, ondansetron **OR** ondansetron (ZOFRAN) IV  Review of Systems: Constitutional: Weight is stable.  Eyes: patient is legally blind ENT: No oral lesions, sore throat.  GI: see HPI.  Heme/Lymph: No easy bruising.  CV: No chest pain.  GU: No hematuria.  Integumentary: No rashes.  Neuro: No headaches.  Psych: No depression/anxiety.  Endocrine: No heat/cold intolerance.  Allergic/Immunologic: No urticaria.  Resp: No cough, SOB.  Musculoskeletal: No joint swelling.    Physical Examination: BP 161/78 mmHg  Pulse 67  Temp(Src) 98 F (36.7 C) (Oral)  Resp 20  Ht 4\' 10"  (1.473  m)  Wt 75 kg (165 lb 5.5 oz)  BMI 34.57 kg/m2  SpO2 99%  LMP 11/21/2007 (Approximate) Gen: NAD, alert and oriented x 4, very calm and pleasant this morning HEENT:patient is legally blind Neck: supple, no JVD or thyromegaly Chest: CTA bilaterally, no wheezes, crackles, or other adventitious sounds CV: RRR, no m/g/c/r Abd: soft, NT, ND, +BS in all four quadrants; no HSM, guarding, ridigity, or rebound tenderness Ext: no edema, well perfused with 2+ pulses, Skin: no rash or lesions noted Lymph: no LAD  Data: Lab Results  Component Value Date   WBC 11.7* 03/06/2016   HGB 8.0* 03/06/2016   HCT 24.6* 03/06/2016   MCV 96.2 03/06/2016   PLT 169 03/06/2016    Recent Labs Lab 03/03/16 2248 03/05/16 1018 03/06/16 0725  HGB 7.8* 9.0* 8.0*   Lab Results  Component Value Date   NA 136 03/04/2016   K 3.6 03/04/2016   CL 100* 03/04/2016   CO2 28 03/04/2016   BUN 25* 03/04/2016   CREATININE 4.55* 03/04/2016   Lab Results  Component Value Date   ALT 14 03/04/2016   AST 32 03/04/2016   ALKPHOS 117 03/04/2016   BILITOT 0.7 03/04/2016   No results for input(s): APTT, INR, PTT in the last 168 hours.    Imaging:  CLINICAL DATA: Generalized abdominal pain. Evaluate progress a capsule from capsule endoscopy.  EXAM: ABDOMEN - 2 VIEW  COMPARISON: Mar 04, 2016  FINDINGS: The lung bases are normal. No free air, portal venous gas, or pneumatosis. Cholecystectomy clips are identified. No bowel obstruction. Moderate fecal loading is again seen in the colon. The capsule is no longer present. No other acute abnormalities.  IMPRESSION: The capsule is no longer present. Moderate fecal loading in the colon.   Electronically Signed  By: Gerome Sam III M.D  On: 03/06/2016 08:55  Assessment/Plan: Erin [name] is a 45 y.o. female With anemia/bright red blood per rectum. KUB completed today shows capsule is no longer present. Hemoglobin slightly decreased to 8.0.  Meckel scan scheduled for tomorrow.  Recommendations: Continue with Meckel scan tomorrow.    We will continue to follow with you. Please call with questions or concerns.  Carney Harder, PA-C  I personally performed these services.

## 2016-03-06 NOTE — Progress Notes (Signed)
Pt still complaining of severe pain in back and now head after 6mg  of PO dilaudid given q6.  MD notified, no new orders.

## 2016-03-06 NOTE — Plan of Care (Signed)
Problem: Pain Managment: Goal: General experience of comfort will improve Outcome: Not Progressing Patient continues to c/o back and right rib pain not resolved with PRN pain medication, pt is requesting an xray of her back and right ribs; Dr. Anne Hahn notified and new order for xray of right ribs ordered, results pending.   Problem: Bowel/Gastric: Goal: Will show no signs and symptoms of gastrointestinal bleeding Outcome: Progressing No signs or symptoms of bleeding this shift.

## 2016-03-06 NOTE — Progress Notes (Signed)
[facility] [place] [facility]                                                                                                                                                                                            Patient Demographics   Erin [name], is a 45 y.o. female, DOB - Jul 10, 1971, ZOX:[id number]  Admit date - 03/03/2016   Admitting Physician Oralia Manis, MD  Outpatient Primary MD for the patient is Pcp Not In System   LOS - 2  Subjective:Denies any significant complaints. She states that the nurse they told her there was no blood in the stool.     Review of Systems:   CONSTITUTIONAL: No documented fever. No fatigue, weakness. No weight gain, no weight loss.  EYES:Unable to see ENT: No tinnitus. No postnasal drip. No redness of the oropharynx.  RESPIRATORY: No cough, no wheeze, no hemoptysis. No dyspnea.  CARDIOVASCULAR: No chest pain. No orthopnea. No palpitations. No syncope.  GASTROINTESTINAL: No nausea, no vomiting or diarrhea. No abdominal pain. Positive melena or hematochezia.  GENITOURINARY: No dysuria or hematuria.  ENDOCRINE: No polyuria or nocturia. No heat or cold intolerance.  HEMATOLOGY: No anemia. No bruising. No bleeding.  INTEGUMENTARY: No rashes. No lesions.  MUSCULOSKELETAL: Complains of back pain NEUROLOGIC: No numbness, tingling, or ataxia. No seizure-type activity.  PSYCHIATRIC: No anxiety. No insomnia. No ADD.    Vitals:   Filed Vitals:   03/05/16 1111 03/05/16 1946 03/06/16 0422 03/06/16 0446  BP: 138/63 161/67 154/66 161/78  Pulse: 72 65 69 67  Temp: 98.7 F (37.1 C) 98.8 F (37.1 C) 98.1 F (36.7 C) 98 F (36.7 C)  TempSrc: Oral Oral Oral Oral  Resp:   20 20  Height:      Weight:      SpO2: 98% 98% 94% 99%    Wt Readings from Last 3 Encounters:  03/04/16 75 kg (165 lb 5.5 oz)  02/29/16 73 kg (160 lb 15 oz)  02/18/16 79 kg (174 lb 2.6 oz)     Intake/Output Summary (Last 24 hours) at 03/06/16  1025 Last data filed at 03/06/16 0900  Gross per 24 hour  Intake    920 ml  Output    650 ml  Net    270 ml    Physical Exam:   GENERAL: Pleasant-appearing in no apparent distress.  HEAD, EYES, EARS, NOSE AND THROAT: Atraumatic, normocephalic. Extraocular muscles are intact. Pupils equal and reactive to light. Sclerae anicteric. No conjunctival injection. No oro-pharyngeal erythema.  NECK: Supple. There is no jugular venous distention. No bruits, no lymphadenopathy, no thyromegaly.  HEART:  Regular rate and rhythm,. No murmurs, no rubs, no clicks.  LUNGS: Clear to auscultation bilaterally. No rales or rhonchi. No wheezes.  ABDOMEN: Soft, flat, nontender, nondistended. Has good bowel sounds. No hepatosplenomegaly appreciated.  EXTREMITIES: No evidence of any cyanosis, clubbing, or peripheral edema.  +2 pedal and radial pulses bilaterally.  NEUROLOGIC: The patient is alert, awake, and oriented x3 with no focal motor or sensory deficits appreciated bilaterally.  SKIN: Moist and warm with no rashes appreciated.  Psych: Not anxious, depressed LN: No inguinal LN enlargement    Antibiotics   Anti-infectives    None      Medications   Scheduled Meds: . docusate sodium  200 mg Oral BID  . [START ON 03/07/2016] epoetin (EPOGEN/PROCRIT) injection  10,000 Units Intravenous Q M,W,F-HD  . fludrocortisone  0.1 mg Oral Daily  . hydrocortisone  10 mg Oral Daily  . hydrocortisone  5 mg Oral QHS  . lactulose  30 g Oral BID  . levothyroxine  112 mcg Oral QAC breakfast  . mometasone-formoterol  2 puff Inhalation BID  . pantoprazole  40 mg Oral Daily  . polyethylene glycol  17 g Oral Daily  . pravastatin  20 mg Oral q1800  . risperiDONE  2 mg Oral QHS  . risperiDONE  4 mg Oral q morning - 10a  . senna  2 tablet Oral Daily  . sertraline  50 mg Oral Daily  . sevelamer carbonate  4,000 mg Oral TID WC  . sodium chloride flush  3 mL Intravenous Q12H  . topiramate  200 mg Oral BID  . topiramate   50 mg Oral BID   Continuous Infusions:  PRN Meds:.acetaminophen **OR** acetaminophen, albuterol, HYDROmorphone, ondansetron **OR** ondansetron (ZOFRAN) IV   Data Review:   Micro Results Recent Results (from the past 240 hour(s))  MRSA PCR Screening     Status: None   Collection Time: 02/26/16  6:02 AM  Result Value Ref Range Status   MRSA by PCR NEGATIVE NEGATIVE Final    Comment:        The GeneXpert MRSA Assay (FDA approved for NASAL specimens only), is one component of a comprehensive MRSA colonization surveillance program. It is not intended to diagnose MRSA infection nor to guide or monitor treatment for MRSA infections.     Radiology Reports Dg Ribs Unilateral Right  03/06/2016  CLINICAL DATA:  Right anterior and posterior pain and back pain. No known injury. EXAM: RIGHT RIBS - 2 VIEW COMPARISON:  Chest 02/25/2016 FINDINGS: There is an acute mildly displaced fracture of the right tenth rib laterally. No visible pneumothorax. No other right rib fractures or focal bone lesions are identified. Postoperative changes in the soft tissues of the right upper arm consistent with vascular graft. Mild possibly positional. Convexity of the thorax all lumbar spine IMPRESSION: Mildly displaced fracture of the right tenth rib.  No pneumothorax. Electronically Signed   By: Burman Nieves M.D.   On: 03/06/2016 01:33   Dg Abd 1 View  03/04/2016  CLINICAL DATA:  Evaluation for endoscopic capsule location EXAM: ABDOMEN - 1 VIEW COMPARISON:  None. FINDINGS: Capsule is located in the mid-pelvis. It is difficult to ascertain whether this capsule is with the in sigmoid colon versus distal ileum based on single view only acquired. There is fairly diffuse stool throughout the colon. There is no bowel dilatation or air-fluid level suggesting obstruction. No free air. There are surgical clips in the right upper quadrant. IMPRESSION: Capsule in mid pelvis. On single supine  examination, it is difficult to  ascertain whether the capsule is in the ileum versus descending colon. Bowel gas pattern normal. Diffuse stool throughout colon. Electronically Signed   By: Bretta Bang III M.D.   On: 03/04/2016 18:11   Ct Head Wo Contrast  03/04/2016  CLINICAL DATA:  Dizziness and weakness beginning yesterday. History of stroke, seizures, psychosis, chronic headaches, hyperlipidemia, pituitary surgery. EXAM: CT HEAD WITHOUT CONTRAST TECHNIQUE: Contiguous axial images were obtained from the base of the skull through the vertex without intravenous contrast. COMPARISON:  CT HEAD October 23, 2015 FINDINGS: Mild motion degraded examination. INTRACRANIAL CONTENTS: The ventricles and sulci are normal. No intraparenchymal hemorrhage, mass effect nor midline shift. Stable RIGHT frontal encephalomalacia. No acute large vascular territory infarcts. No abnormal extra-axial fluid collections. Basal cisterns are patent. ORBITS: The included ocular globes and orbital contents are normal. SINUSES: RIGHT sphenoid sinus mucosal thickening. Mastoid air cells are well aerated. SKULL/SOFT TISSUES: No skull fracture. Old RIGHT frontal craniotomy. No significant soft tissue swelling. IMPRESSION: No acute intracranial process on this motion degraded examination. Acute RIGHT sphenoid sinusitis. 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 Electronically Signed   By: Awilda Metro M.D.   On: 03/04/2016 01:17   US Venous Img Lower Unilateral Right  02/18/2016  CLINICAL DATA:  Right leg pain. Pain in the foot last week, calf pain onset today. EXAM: RIGHT LOWER EXTREMITY VENOUS DOPPLER ULTRASOUND TECHNIQUE: Gray-scale sonography with graded compression, as well as color Doppler and duplex ultrasound were performed to evaluate the lower extremity deep venous systems from the level of the common femoral vein and including the common femoral, femoral, profunda femoral, popliteal and calf veins including the posterior tibial, peroneal  and gastrocnemius veins when visible. The superficial great saphenous vein was also interrogated. Spectral Doppler was utilized to evaluate flow at rest and with distal augmentation maneuvers in the common femoral, femoral and popliteal veins. COMPARISON:  None. FINDINGS: Contralateral Common Femoral Vein: Respiratory phasicity is normal and symmetric with the symptomatic side. No evidence of thrombus. Normal compressibility. Common Femoral Vein: No evidence of thrombus. Normal compressibility, respiratory phasicity and response to augmentation. Saphenofemoral Junction: No evidence of thrombus. Normal compressibility and flow on color Doppler imaging. Profunda Femoral Vein: No evidence of thrombus. Normal compressibility and flow on color Doppler imaging. Femoral Vein: No evidence of thrombus. Normal compressibility, respiratory phasicity and response to augmentation. Popliteal Vein: No evidence of thrombus. Normal compressibility, respiratory phasicity and response to augmentation. Calf Veins: No evidence of thrombus. Normal compressibility and flow on color Doppler imaging. Superficial Great Saphenous Vein: No evidence of thrombus. Normal compressibility and flow on color Doppler imaging. Venous Reflux:  None. Other Findings:  None. IMPRESSION: No evidence of right lower extremity deep venous thrombosis. Electronically Signed   By: Rubye Oaks M.D.   On: 02/18/2016 21:36   Dg Chest Portable 1 View  02/25/2016  CLINICAL DATA:  Chest pain, hemodialysis EXAM: PORTABLE CHEST 1 VIEW COMPARISON:  11/03/2015 FINDINGS: Cardiomegaly again noted. Dual lumen left IJ catheter is unchanged in position. No acute infiltrate or pleural effusion. No pulmonary edema. IMPRESSION: No active disease. Electronically Signed   By: Natasha Mead M.D.   On: 02/25/2016 15:14   Dg Abd 2 Views  03/06/2016  CLINICAL DATA:  Generalized abdominal pain. Evaluate progress a capsule from capsule endoscopy. EXAM: ABDOMEN - 2 VIEW COMPARISON:   Mar 04, 2016 FINDINGS: The lung bases are normal. No free air, portal venous gas, or pneumatosis. Cholecystectomy clips are identified. No  bowel obstruction. Moderate fecal loading is again seen in the colon. The capsule is no longer present. No other acute abnormalities. IMPRESSION: The capsule is no longer present. Moderate fecal loading in the colon. Electronically Signed   By: Gerome Sam III M.D   On: 03/06/2016 08:55     CBC  Recent Labs Lab 02/29/16 0547 03/03/16 2248 03/05/16 1018 03/06/16 0725  WBC 6.7 5.7 6.9 11.7*  HGB 8.3* 7.8* 9.0* 8.0*  HCT 24.9* 24.2* 27.5* 24.6*  PLT 177 156 141* 169  MCV 96.2 95.9 95.4 96.2  MCH 31.9 31.0 31.1 31.3  MCHC 33.1 32.4 32.6 32.6  RDW 15.9* 16.7* 17.0* 17.4*  LYMPHSABS  --   --  1.4  --   MONOABS  --   --  0.4  --   EOSABS  --   --  0.1  --   BASOSABS  --   --  0.1  --     Chemistries   Recent Labs Lab 02/29/16 0547 03/03/16 2248 03/04/16 0809  NA 139 136 136  K 3.6 3.1* 3.6  CL 102 99* 100*  CO2 29 30 28   GLUCOSE 73 99 71  BUN 26* 22* 25*  CREATININE 4.83* 4.14* 4.55*  CALCIUM 7.8* 7.3* 7.3*  AST  --  36 32  ALT  --  14 14  ALKPHOS  --  132* 117  BILITOT  --  0.9 0.7   ------------------------------------------------------------------------------------------------------------------ estimated creatinine clearance is 13.9 mL/min (by C-G formula based on Cr of 4.55). ------------------------------------------------------------------------------------------------------------------ No results for input(s): HGBA1C in the last 72 hours. ------------------------------------------------------------------------------------------------------------------ No results for input(s): CHOL, HDL, LDLCALC, TRIG, CHOLHDL, LDLDIRECT in the last 72 hours. ------------------------------------------------------------------------------------------------------------------ No results for input(s): TSH, T4TOTAL, T3FREE, THYROIDAB in the last 72  hours.  Invalid input(s): FREET3 ------------------------------------------------------------------------------------------------------------------ No results for input(s): VITAMINB12, FOLATE, FERRITIN, TIBC, IRON, RETICCTPCT in the last 72 hours.  Coagulation profile No results for input(s): INR, PROTIME in the last 168 hours.  No results for input(s): DDIMER in the last 72 hours.  Cardiac Enzymes  Recent Labs Lab 03/03/16 2248  TROPONINI <0.03   ------------------------------------------------------------------------------------------------------------------ Invalid input(s): POCBNP    Assessment & Plan  Patient is a 45 year old recently discharged from the hospital admitted with GI bleed  1. GI bleed -hemoglobin stable , Had a capsule endoscopy had  Passed , will need out patient f/u with gi 2.  Generalized weakness - possibly related to GI bleed and anemia 3. Bipolar 1 disorder, mixed, moderate (HCC) - continue Risperdal and Zoloft 4.  Hypothyroidism - continue Synthroid 5.  GERD (gastroesophageal reflux disease) - continue Protonix 6.  ESRD on Dialysis - nephrology following for HD 7. Disposition patient very upset about her current living situation. We will have case manager discussed options for discharge tomorrow    Code Status Orders        Start     Ordered   03/04/16 0605  Full code   Continuous     03/04/16 0604    Code Status History    Date Active Date Inactive Code Status Order ID Comments User Context   02/25/2016  3:36 PM 03/01/2016  3:06 PM Full Code [id number]  Enedina Finner, MD Inpatient           Consults  GI   DVT Prophylaxis  SCD's  Lab Results  Component Value Date   PLT 169 03/06/2016     Time Spent in minutes    Greater than 50% of time spent  in care coordination and counseling patient regarding the condition and plan of care.   [name] M.D on 03/06/2016 at 10:25 AM  Between 7am to 6pm - Pager -  [phone]  After 6pm go to [email or web address] - password [email or web address] [facility]  [facility]   Office  [phone]

## 2016-03-06 NOTE — Progress Notes (Signed)
Subjective:  Patient resting comfortably in bed this a.m. She again declines dialysis today. She states that she will be willing to have dialysis tomorrow. She only had one hour of dialysis on Friday.    Objective:  Vital signs in last 24 hours:  Temp:  [98 F (36.7 C)-98.8 F (37.1 C)] 98 F (36.7 C) (05/07 0446) Pulse Rate:  [65-69] 67 (05/07 0446) Resp:  [20] 20 (05/07 0446) BP: (154-161)/(66-78) 161/78 mmHg (05/07 0446) SpO2:  [94 %-99 %] 99 % (05/07 0446)  Weight change:  Filed Weights   03/03/16 2241 03/04/16 1440 03/04/16 1615  Weight: 74.39 kg (164 lb) 75 kg (165 lb 5.5 oz) 75 kg (165 lb 5.5 oz)    Intake/Output:    Intake/Output Summary (Last 24 hours) at 03/06/16 1201 Last data filed at 03/06/16 0900  Gross per 24 hour  Intake    920 ml  Output    650 ml  Net    270 ml     Physical Exam: General: NAD, laying in bed  HEENT Anicteric, moist mucus membranes  Neck supple  Pulm/lungs Clear, room air, normal effort  CVS/Heart Regular, no rub, harsh systolic murmur  Abdomen:  Soft, non tender, BS present  Extremities: trace peripheral edema  Neurologic: Alert, able to follow commands  Skin: No acute rashes  Access: Right arm AV graft       Basic Metabolic Panel:   Recent Labs Lab 02/29/16 0547 03/03/16 2248 03/04/16 0809 03/04/16 1507  NA 139 136 136  --   K 3.6 3.1* 3.6  --   CL 102 99* 100*  --   CO2 29 30 28   --   GLUCOSE 73 99 71  --   BUN 26* 22* 25*  --   CREATININE 4.83* 4.14* 4.55*  --   CALCIUM 7.8* 7.3* 7.3*  --   PHOS  --   --   --  2.6     CBC:  Recent Labs Lab 02/29/16 0547 03/03/16 2248 03/05/16 1018 03/06/16 0725  WBC 6.7 5.7 6.9 11.7*  NEUTROABS  --   --  4.9  --   HGB 8.3* 7.8* 9.0* 8.0*  HCT 24.9* 24.2* 27.5* 24.6*  MCV 96.2 95.9 95.4 96.2  PLT 177 156 141* 169      Microbiology:  Recent Results (from the past 720 hour(s))  MRSA PCR Screening     Status: None   Collection Time: 02/26/16  6:02 AM   Result Value Ref Range Status   MRSA by PCR NEGATIVE NEGATIVE Final    Comment:        The GeneXpert MRSA Assay (FDA approved for NASAL specimens only), is one component of a comprehensive MRSA colonization surveillance program. It is not intended to diagnose MRSA infection nor to guide or monitor treatment for MRSA infections.     Coagulation Studies: No results for input(s): LABPROT, INR in the last 72 hours.  Urinalysis: No results for input(s): COLORURINE, LABSPEC, PHURINE, GLUCOSEU, HGBUR, BILIRUBINUR, KETONESUR, PROTEINUR, UROBILINOGEN, NITRITE, LEUKOCYTESUR in the last 72 hours.  Invalid input(s): APPERANCEUR    Imaging: Dg Ribs Unilateral Right  03/06/2016  CLINICAL DATA:  Right anterior and posterior pain and back pain. No known injury. EXAM: RIGHT RIBS - 2 VIEW COMPARISON:  Chest 02/25/2016 FINDINGS: There is an acute mildly displaced fracture of the right tenth rib laterally. No visible pneumothorax. No other right rib fractures or focal bone lesions are identified. Postoperative changes in the soft tissues of the  right upper arm consistent with vascular graft. Mild possibly positional. Convexity of the thorax all lumbar spine IMPRESSION: Mildly displaced fracture of the right tenth rib.  No pneumothorax. Electronically Signed   By: Burman Nieves M.D.   On: 03/06/2016 01:33   Dg Abd 1 View  03/04/2016  CLINICAL DATA:  Evaluation for endoscopic capsule location EXAM: ABDOMEN - 1 VIEW COMPARISON:  None. FINDINGS: Capsule is located in the mid-pelvis. It is difficult to ascertain whether this capsule is with the in sigmoid colon versus distal ileum based on single view only acquired. There is fairly diffuse stool throughout the colon. There is no bowel dilatation or air-fluid level suggesting obstruction. No free air. There are surgical clips in the right upper quadrant. IMPRESSION: Capsule in mid pelvis. On single supine examination, it is difficult to ascertain whether the  capsule is in the ileum versus descending colon. Bowel gas pattern normal. Diffuse stool throughout colon. Electronically Signed   By: Bretta Bang III M.D.   On: 03/04/2016 18:11   Dg Abd 2 Views  03/06/2016  CLINICAL DATA:  Generalized abdominal pain. Evaluate progress a capsule from capsule endoscopy. EXAM: ABDOMEN - 2 VIEW COMPARISON:  Mar 04, 2016 FINDINGS: The lung bases are normal. No free air, portal venous gas, or pneumatosis. Cholecystectomy clips are identified. No bowel obstruction. Moderate fecal loading is again seen in the colon. The capsule is no longer present. No other acute abnormalities. IMPRESSION: The capsule is no longer present. Moderate fecal loading in the colon. Electronically Signed   By: Gerome Sam III M.D   On: 03/06/2016 08:55     Medications:     . docusate sodium  200 mg Oral BID  . [START ON 03/07/2016] epoetin (EPOGEN/PROCRIT) injection  10,000 Units Intravenous Q M,W,F-HD  . fludrocortisone  0.1 mg Oral Daily  . hydrocortisone  10 mg Oral Daily  . hydrocortisone  5 mg Oral QHS  . lactulose  30 g Oral BID  . levothyroxine  112 mcg Oral QAC breakfast  . mometasone-formoterol  2 puff Inhalation BID  . pantoprazole  40 mg Oral Daily  . polyethylene glycol  17 g Oral Daily  . pravastatin  20 mg Oral q1800  . risperiDONE  2 mg Oral QHS  . risperiDONE  4 mg Oral q morning - 10a  . senna  2 tablet Oral Daily  . sertraline  50 mg Oral Daily  . sevelamer carbonate  4,000 mg Oral TID WC  . sodium chloride flush  3 mL Intravenous Q12H  . topiramate  200 mg Oral BID  . topiramate  50 mg Oral BID   acetaminophen **OR** acetaminophen, albuterol, HYDROmorphone, ondansetron **OR** ondansetron (ZOFRAN) IV  Assessment/ Plan:  45 y.o. 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 ESRD- [facility]/ MWF-2/ [facility] - Patient had only 1 hour of dialysis on Friday. She declined dialysis again yesterday. The patient was counseled on the dangers of this yesterday. I again offered her dialysis today however she declines. We will plan for dialysis again tomorrow if she allows.  2. AoCKD and blood loss - Hemoglobin down to 8.0. Continue Epogen with dialysis.  Gastroenterology following.  3. SHPTH - Continue Renvela for phosphorous control. Continue to periodically monitor phosphorus.     LOS:  2 Tradarius Reinwald 5/7/201712:01 PM

## 2016-03-07 ENCOUNTER — Observation Stay: Payer: Medicare Other

## 2016-03-07 DIAGNOSIS — K922 Gastrointestinal hemorrhage, unspecified: Secondary | ICD-10-CM | POA: Diagnosis not present

## 2016-03-07 LAB — RENAL FUNCTION PANEL
ALBUMIN: 2.8 g/dL — AB (ref 3.5–5.0)
ANION GAP: 13 (ref 5–15)
BUN: 44 mg/dL — AB (ref 6–20)
CO2: 24 mmol/L (ref 22–32)
Calcium: 8 mg/dL — ABNORMAL LOW (ref 8.9–10.3)
Chloride: 98 mmol/L — ABNORMAL LOW (ref 101–111)
Creatinine, Ser: 5.16 mg/dL — ABNORMAL HIGH (ref 0.44–1.00)
GFR, EST AFRICAN AMERICAN: 11 mL/min — AB (ref >60)
GFR, EST NON AFRICAN AMERICAN: 9 mL/min — AB (ref >60)
Glucose, Bld: 87 mg/dL (ref 65–99)
PHOSPHORUS: 1.9 mg/dL — AB (ref 2.5–4.6)
POTASSIUM: 3.4 mmol/L — AB (ref 3.5–5.1)
Sodium: 135 mmol/L (ref 135–145)

## 2016-03-07 LAB — CBC
HEMATOCRIT: 24 % — AB (ref 35.0–47.0)
Hemoglobin: 7.9 g/dL — ABNORMAL LOW (ref 12.0–16.0)
MCH: 31.3 pg (ref 26.0–34.0)
MCHC: 32.8 g/dL (ref 32.0–36.0)
MCV: 95.6 fL (ref 80.0–100.0)
Platelets: 177 10*3/uL (ref 150–440)
RBC: 2.51 MIL/uL — AB (ref 3.80–5.20)
RDW: 16.8 % — AB (ref 11.5–14.5)
WBC: 2 10*3/uL — AB (ref 3.6–11.0)

## 2016-03-07 MED ORDER — DOCUSATE SODIUM 100 MG PO CAPS: 200.0000 mg | ORAL_CAPSULE | Freq: Two times a day (BID) | ORAL | Status: DC

## 2016-03-07 MED ORDER — HYDROMORPHONE HCL 2 MG PO TABS
6.0000 mg | ORAL_TABLET | ORAL | Status: DC | PRN
  Administered 2016-03-07: 6 mg via ORAL
  Filled 2016-03-07: qty 3

## 2016-03-07 MED ORDER — OXYCODONE HCL 10 MG PO TABS: 10.0000 mg | ORAL_TABLET | Freq: Three times a day (TID) | ORAL | Status: DC | PRN

## 2016-03-07 MED ORDER — LACTULOSE 10 GM/15ML PO SOLN: 30.0000 g | Freq: Two times a day (BID) | ORAL | Status: DC

## 2016-03-07 MED ORDER — SENNA 8.6 MG PO TABS: 2.0000 | ORAL_TABLET | Freq: Every day | ORAL | Status: DC

## 2016-03-07 MED ORDER — SODIUM PERTECHNETATE TC 99M INJECTION
17.2500 | Freq: Once | INTRAVENOUS | Status: AC | PRN
  Administered 2016-03-07: 17.25 via INTRAVENOUS

## 2016-03-07 MED ORDER — HYDROMORPHONE HCL 4 MG PO TABS: 6.0000 mg | ORAL_TABLET | Freq: Four times a day (QID) | ORAL | Status: DC | PRN

## 2016-03-07 NOTE — Care Management Note (Signed)
Patient is active at [facility] on TTS Schedule on 2nd shift appox.11:00 am I have sent admission records to clinic and will follow up with addtioinal records at discharge.  Ivor Reining Dialysis Coordinator [phone]

## 2016-03-07 NOTE — Progress Notes (Signed)
Subjective:     Objective:  Vital signs in last 24 hours:  Temp:  [97.4 F (36.3 C)-98.6 F (37 C)] 97.4 F (36.3 C) (05/08 1045) Pulse Rate:  [66-74] 73 (05/08 1100) Resp:  [16-23] 23 (05/08 1100) BP: (155-172)/(72-99) 155/74 mmHg (05/08 1100) SpO2:  [99 %-100 %] 100 % (05/08 1100)  Weight change:  Filed Weights   03/03/16 2241 03/04/16 1440 03/04/16 1615  Weight: 74.39 kg (164 lb) 75 kg (165 lb 5.5 oz) 75 kg (165 lb 5.5 oz)    Intake/Output:    Intake/Output Summary (Last 24 hours) at 03/07/16 1121 Last data filed at 03/07/16 0719  Gross per 24 hour  Intake    800 ml  Output     50 ml  Net    750 ml     Physical Exam: General: NAD, laying in bed  HEENT Anicteric, moist mucus membranes  Neck supple  Pulm/lungs Clear, room air, normal effort  CVS/Heart Regular, no rub, harsh systolic murmur  Abdomen:  Soft, non tender, BS present  Extremities: trace peripheral edema  Neurologic: Alert, able to follow commands  Skin: No acute rashes  Access: Right arm AV graft       Basic Metabolic Panel:   Recent Labs Lab 03/03/16 2248 03/04/16 0809 03/04/16 1507  NA 136 136  --   K 3.1* 3.6  --   CL 99* 100*  --   CO2 30 28  --   GLUCOSE 99 71  --   BUN 22* 25*  --   CREATININE 4.14* 4.55*  --   CALCIUM 7.3* 7.3*  --   PHOS  --   --  2.6     CBC:  Recent Labs Lab 03/03/16 2248 03/05/16 1018 03/06/16 0725 03/07/16 1058  WBC 5.7 6.9 11.7* 2.0*  NEUTROABS  --  4.9  --   --   HGB 7.8* 9.0* 8.0* 7.9*  HCT 24.2* 27.5* 24.6* 24.0*  MCV 95.9 95.4 96.2 95.6  PLT 156 141* 169 177      Microbiology:  Recent Results (from the past 720 hour(s))  MRSA PCR Screening     Status: None   Collection Time: 02/26/16  6:02 AM  Result Value Ref Range Status   MRSA by PCR NEGATIVE NEGATIVE Final    Comment:        The GeneXpert MRSA Assay (FDA approved for NASAL specimens only), is one component of a comprehensive MRSA colonization surveillance program. It  is not intended to diagnose MRSA infection nor to guide or monitor treatment for MRSA infections.     Coagulation Studies: No results for input(s): LABPROT, INR in the last 72 hours.  Urinalysis: No results for input(s): COLORURINE, LABSPEC, PHURINE, GLUCOSEU, HGBUR, BILIRUBINUR, KETONESUR, PROTEINUR, UROBILINOGEN, NITRITE, LEUKOCYTESUR in the last 72 hours.  Invalid input(s): APPERANCEUR    Imaging: Dg Ribs Unilateral Right  03/06/2016  CLINICAL DATA:  Right anterior and posterior pain and back pain. No known injury. EXAM: RIGHT RIBS - 2 VIEW COMPARISON:  Chest 02/25/2016 FINDINGS: There is an acute mildly displaced fracture of the right tenth rib laterally. No visible pneumothorax. No other right rib fractures or focal bone lesions are identified. Postoperative changes in the soft tissues of the right upper arm consistent with vascular graft. Mild possibly positional. Convexity of the thorax all lumbar spine IMPRESSION: Mildly displaced fracture of the right tenth rib.  No pneumothorax. Electronically Signed   By: Burman Nieves M.D.   On: 03/06/2016 01:33  Nm Bowel Img Meckels  03/07/2016  CLINICAL DATA:  Intermittent GI bleeding.  Weakness.  Jaundice. EXAM: NUCLEAR MEDICINE MECKELS SCAN TECHNIQUE: Sequential abdominal images were obtained following intravenous injection of radiopharmaceutical. RADIOPHARMACEUTICALS:  17.3 millicuries technetium pertechnetate COMPARISON:  CT abdomen 01/12/2013 FINDINGS: There is no abnormal accumulation of radiotracer within the abdomen or pelvis to localize ectopic gastric mucosa (Meckel's diverticulum). Physiologic uptake noted the stomach and proximal small bowel and blood pool. IMPRESSION: No scintigraphic localization of ectopic gastric mucosa (Meckel's diverticulum). Negative Meckel's scan. Electronically Signed   By: Genevive Bi M.D.   On: 03/07/2016 10:49   Dg Abd 2 Views  03/06/2016  CLINICAL DATA:  Generalized abdominal pain. Evaluate  progress a capsule from capsule endoscopy. EXAM: ABDOMEN - 2 VIEW COMPARISON:  Mar 04, 2016 FINDINGS: The lung bases are normal. No free air, portal venous gas, or pneumatosis. Cholecystectomy clips are identified. No bowel obstruction. Moderate fecal loading is again seen in the colon. The capsule is no longer present. No other acute abnormalities. IMPRESSION: The capsule is no longer present. Moderate fecal loading in the colon. Electronically Signed   By: Gerome Sam III M.D   On: 03/06/2016 08:55     Medications:     . docusate sodium  200 mg Oral BID  . epoetin (EPOGEN/PROCRIT) injection  10,000 Units Intravenous Q M,W,F-HD  . fludrocortisone  0.1 mg Oral Daily  . hydrocortisone  10 mg Oral Daily  . hydrocortisone  5 mg Oral QHS  . lactulose  30 g Oral BID  . levothyroxine  112 mcg Oral QAC breakfast  . mometasone-formoterol  2 puff Inhalation BID  . pantoprazole  40 mg Oral Daily  . polyethylene glycol  17 g Oral Daily  . pravastatin  20 mg Oral q1800  . risperiDONE  2 mg Oral QHS  . risperiDONE  4 mg Oral q morning - 10a  . senna  2 tablet Oral Daily  . sertraline  50 mg Oral Daily  . sevelamer carbonate  4,000 mg Oral TID WC  . sodium chloride flush  3 mL Intravenous Q12H  . topiramate  200 mg Oral BID  . topiramate  50 mg Oral BID   acetaminophen **OR** acetaminophen, albuterol, HYDROmorphone, ondansetron **OR** ondansetron (ZOFRAN) IV  Assessment/ Plan:  45 y.o. 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 ESRD: seen and examined on hemodialysis. Tolerating treatment well. Encouraged patient to stay for entire treatment.  - MWF schedule  2. Anemia of CKD and blood loss:  -  Continue Epogen with  dialysis.    3. SHPTH - Continue Renvela for phosphorous control.   Discussed case with Dr. Allena Katz.    LOS: 3 Taquan Bralley 5/8/201711:21 AM

## 2016-03-07 NOTE — Progress Notes (Signed)
Pre dialysis assessment 

## 2016-03-07 NOTE — Discharge Instructions (Signed)
°  DIET:  Renal diet   DISCHARGE CONDITION:  Stable  ACTIVITY:  Activity as tolerated  OXYGEN:  Home Oxygen: No.   Oxygen Delivery: room air  DISCHARGE LOCATION:  assited living    ADDITIONAL DISCHARGE INSTRUCTION:   If you experience worsening of your admission symptoms, develop shortness of breath, life threatening emergency, suicidal or homicidal thoughts you must seek medical attention immediately by calling 911 or calling your MD immediately  if symptoms less severe.  You Must read complete instructions/literature along with all the possible adverse reactions/side effects for all the Medicines you take and that have been prescribed to you. Take any new Medicines after you have completely understood and accpet all the possible adverse reactions/side effects.   Please note  You were cared for by a hospitalist during your hospital stay. If you have any questions about your discharge medications or the care you received while you were in the hospital after you are discharged, you can call the unit and asked to speak with the hospitalist on call if the hospitalist that took care of you is not available. Once you are discharged, your primary care physician will handle any further medical issues. Please note that NO REFILLS for any discharge medications will be authorized once you are discharged, as it is imperative that you return to your primary care physician (or establish a relationship with a primary care physician if you do not have one) for your aftercare needs so that they can reassess your need for medications and monitor your lab values.

## 2016-03-07 NOTE — Progress Notes (Signed)
Treatment end assessment 

## 2016-03-07 NOTE — Progress Notes (Signed)
Patient is medically stable for D/C back to [facility] today. Liborio Nixon and [address] staff came to visit patient today and reported that patient can return. Per Tammy staff member at [facility] will pick patient up in 30 minutes. Clinical Child psychotherapist (CSW) prepared D/C Packet and gave it to [place]. RN is aware of above. Patient is aware of above. Please reconsult if future social work needs arise. CSW signing off.   Jetta Lout, LCSW [phone]

## 2016-03-07 NOTE — Discharge Summary (Signed)
Erin [name], 45 y.o., DOB 22-Oct-1971, MRN [id number]. Admission date: 03/03/2016 Discharge Date 03/07/2016 Primary MD Pcp Not In System Admitting Physician Erin Manis, MD  Admission Diagnosis  Dizziness [R42] Hyperammonemia (HCC) [E72.20] Acute GI bleeding [K92.2]  Discharge Diagnosis   Principal Problem:   GI bleed unclear source of the bleeding status post capsule endoscopy and a Meckel's bleeding scan   Bipolar 1 disorder, mixed, moderate (HCC)   Generalized weakness   Hypothyroidism   GERD (gastroesophageal reflux disease)   ESRD on dialysis [facility])         Hospital Course Erin [name] is a 45 y.o. female who presents with Dizziness, generalized weakness, blood in stool. Patient was just hospitalized here, during this stay received a couple units of blood. On the day prior to presentation in the ED she noted blood in her stool. She developed generalized weakness and dizziness. Patient was brought to the ED for further evaluation. She was seen by GI. Patient had a unremarkable endoscopy and a colonoscopy in August 2016 at [facility]. During this hospitalization she was seen by GI and had a capsule endoscopy the results are currently pending. She also had a Meckel's bleeding scan which was negative. Currently there is no further evidence of bleeding. And is stable for discharge. With outpatient follow-up. Length of pain in her back she had x-ray of her chest which showed a mildly displaced right 10th rib fracture.          Consults  nephrology, gi  Significant Tests:  See full reports for all details      Dg Ribs Unilateral Right  03/06/2016  CLINICAL DATA:  Right anterior and posterior pain and back pain. No known injury. EXAM: RIGHT RIBS - 2 VIEW COMPARISON:  Chest 02/25/2016 FINDINGS: There is an acute mildly displaced fracture of the right tenth rib laterally. No visible pneumothorax. No other right rib fractures or focal bone lesions are identified. Postoperative changes  in the soft tissues of the right upper arm consistent with vascular graft. Mild possibly positional. Convexity of the thorax all lumbar spine IMPRESSION: Mildly displaced fracture of the right tenth rib.  No pneumothorax. Electronically Signed   By: Burman Nieves M.D.   On: 03/06/2016 01:33   Dg Abd 1 View  03/04/2016  CLINICAL DATA:  Evaluation for endoscopic capsule location EXAM: ABDOMEN - 1 VIEW COMPARISON:  None. FINDINGS: Capsule is located in the mid-pelvis. It is difficult to ascertain whether this capsule is with the in sigmoid colon versus distal ileum based on single view only acquired. There is fairly diffuse stool throughout the colon. There is no bowel dilatation or air-fluid level suggesting obstruction. No free air. There are surgical clips in the right upper quadrant. IMPRESSION: Capsule in mid pelvis. On single supine examination, it is difficult to ascertain whether the capsule is in the ileum versus descending colon. Bowel gas pattern normal. Diffuse stool throughout colon. Electronically Signed   By: Bretta Bang III M.D.   On: 03/04/2016 18:11   Ct Head Wo Contrast  03/04/2016  CLINICAL DATA:  Dizziness and weakness beginning yesterday. History of stroke, seizures, psychosis, chronic headaches, hyperlipidemia, pituitary surgery. EXAM: CT HEAD WITHOUT CONTRAST TECHNIQUE: Contiguous axial images were obtained from the base of the skull through the vertex without intravenous contrast. COMPARISON:  CT HEAD October 23, 2015 FINDINGS: Mild motion degraded examination. INTRACRANIAL CONTENTS: The ventricles and sulci are normal. No intraparenchymal hemorrhage, mass effect nor midline shift. Stable RIGHT frontal encephalomalacia. No acute [name]  vascular territory infarcts. No abnormal extra-axial fluid collections. Basal cisterns are patent. ORBITS: The included ocular globes and orbital contents are normal. SINUSES: RIGHT sphenoid sinus mucosal thickening. Mastoid air cells are well  aerated. SKULL/SOFT TISSUES: No skull fracture. Old RIGHT frontal craniotomy. No significant soft tissue swelling. IMPRESSION: No acute intracranial process on this motion degraded examination. Acute RIGHT sphenoid sinusitis. 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 Electronically Signed   By: Awilda Metro M.D.   On: 03/04/2016 01:17   Nm Bowel Img Meckels  03/07/2016  CLINICAL DATA:  Intermittent GI bleeding.  Weakness.  Jaundice. EXAM: NUCLEAR MEDICINE MECKELS SCAN TECHNIQUE: Sequential abdominal images were obtained following intravenous injection of radiopharmaceutical. RADIOPHARMACEUTICALS:  17.3 millicuries technetium pertechnetate COMPARISON:  CT abdomen 01/12/2013 FINDINGS: There is no abnormal accumulation of radiotracer within the abdomen or pelvis to localize ectopic gastric mucosa (Meckel's diverticulum). Physiologic uptake noted the stomach and proximal small bowel and blood pool. IMPRESSION: No scintigraphic localization of ectopic gastric mucosa (Meckel's diverticulum). Negative Meckel's scan. Electronically Signed   By: Genevive Bi M.D.   On: 03/07/2016 10:49   US Venous Img Lower Unilateral Right  02/18/2016  CLINICAL DATA:  Right leg pain. Pain in the foot last week, calf pain onset today. EXAM: RIGHT LOWER EXTREMITY VENOUS DOPPLER ULTRASOUND TECHNIQUE: Gray-scale sonography with graded compression, as well as color Doppler and duplex ultrasound were performed to evaluate the lower extremity deep venous systems from the level of the common femoral vein and including the common femoral, femoral, profunda femoral, popliteal and calf veins including the posterior tibial, peroneal and gastrocnemius veins when visible. The superficial great saphenous vein was also interrogated. Spectral Doppler was utilized to evaluate flow at rest and with distal augmentation maneuvers in the common femoral, femoral and popliteal veins. COMPARISON:  None. FINDINGS: Contralateral Common  Femoral Vein: Respiratory phasicity is normal and symmetric with the symptomatic side. No evidence of thrombus. Normal compressibility. Common Femoral Vein: No evidence of thrombus. Normal compressibility, respiratory phasicity and response to augmentation. Saphenofemoral Junction: No evidence of thrombus. Normal compressibility and flow on color Doppler imaging. Profunda Femoral Vein: No evidence of thrombus. Normal compressibility and flow on color Doppler imaging. Femoral Vein: No evidence of thrombus. Normal compressibility, respiratory phasicity and response to augmentation. Popliteal Vein: No evidence of thrombus. Normal compressibility, respiratory phasicity and response to augmentation. Calf Veins: No evidence of thrombus. Normal compressibility and flow on color Doppler imaging. Superficial Great Saphenous Vein: No evidence of thrombus. Normal compressibility and flow on color Doppler imaging. Venous Reflux:  None. Other Findings:  None. IMPRESSION: No evidence of right lower extremity deep venous thrombosis. Electronically Signed   By: Rubye Oaks M.D.   On: 02/18/2016 21:36   Dg Chest Portable 1 View  02/25/2016  CLINICAL DATA:  Chest pain, hemodialysis EXAM: PORTABLE CHEST 1 VIEW COMPARISON:  11/03/2015 FINDINGS: Cardiomegaly again noted. Dual lumen left IJ catheter is unchanged in position. No acute infiltrate or pleural effusion. No pulmonary edema. IMPRESSION: No active disease. Electronically Signed   By: Natasha Mead M.D.   On: 02/25/2016 15:14   Dg Abd 2 Views  03/06/2016  CLINICAL DATA:  Generalized abdominal pain. Evaluate progress a capsule from capsule endoscopy. EXAM: ABDOMEN - 2 VIEW COMPARISON:  Mar 04, 2016 FINDINGS: The lung bases are normal. No free air, portal venous gas, or pneumatosis. Cholecystectomy clips are identified. No bowel obstruction. Moderate fecal loading is again seen in the colon. The capsule is no longer present. No other acute  abnormalities. IMPRESSION: The  capsule is no longer present. Moderate fecal loading in the colon. Electronically Signed   By: Gerome Sam III M.D   On: 03/06/2016 08:55       Today   Subjective:   Erin [name]  complains of pain in the right back chest. Denies any shortness of breath  Objective:   Blood pressure 163/69, pulse 62, temperature 98.1 F (36.7 C), temperature source Oral, resp. rate 15, height 4\' 10"  (1.473 m), weight 70 kg (154 lb 5.2 oz), last menstrual period 11/21/2007, SpO2 99 %.  .  Intake/Output Summary (Last 24 hours) at 03/07/16 1306 Last data filed at 03/07/16 1217  Gross per 24 hour  Intake    800 ml  Output    513 ml  Net    287 ml    Exam VITAL SIGNS: Blood pressure 163/69, pulse 62, temperature 98.1 F (36.7 C), temperature source Oral, resp. rate 15, height 4\' 10"  (1.473 m), weight 70 kg (154 lb 5.2 oz), last menstrual period 11/21/2007, SpO2 99 %.  GENERAL:  45 y.o.-year-old patient lying in the bed with no acute distress.  EYES: Pupils equal, round, reactive to light and accommodation. No scleral icterus. Extraocular muscles intact.  HEENT: Head atraumatic, normocephalic. Oropharynx and nasopharynx clear.  NECK:  Supple, no jugular venous distention. No thyroid enlargement, no tenderness.  LUNGS: Normal breath sounds bilaterally, no wheezing, rales,rhonchi or crepitation. No use of accessory muscles of respiration.  CARDIOVASCULAR: S1, S2 normal. No murmurs, rubs, or gallops.  ABDOMEN: Soft, nontender, nondistended. Bowel sounds present. No organomegaly or mass.  EXTREMITIES: No pedal edema, cyanosis, or clubbing.  NEUROLOGIC: Cranial nerves II through XII are intact. Muscle strength 5/5 in all extremities. Sensation intact. Gait not checked.  PSYCHIATRIC: The patient is alert and oriented x 3.  SKIN: No obvious rash, lesion, or ulcer.   Data Review     CBC w Diff: Lab Results  Component Value Date   WBC 2.0* 03/07/2016   WBC 15.9* 11/25/2014   HGB 7.9*  03/07/2016   HGB 12.3 11/25/2014   HCT 24.0* 03/07/2016   HCT 37.5 11/25/2014   PLT 177 03/07/2016   PLT 230 11/25/2014   LYMPHOPCT 21 03/05/2016   LYMPHOPCT 18.7 11/15/2013   MONOPCT 6 03/05/2016   MONOPCT 5.8 11/15/2013   MONOPCT 3 12/08/2012   EOSPCT 1 03/05/2016   EOSPCT 0.7 11/15/2013   BASOPCT 1 03/05/2016   BASOPCT 1.5 11/15/2013   CMP: Lab Results  Component Value Date   NA 135 03/07/2016   NA 140 11/25/2014   K 3.4* 03/07/2016   K 3.7 11/25/2014   CL 98* 03/07/2016   CL 100 11/25/2014   CO2 24 03/07/2016   CO2 25 11/25/2014   BUN 44* 03/07/2016   BUN 56* 11/25/2014   CREATININE 5.16* 03/07/2016   CREATININE 6.17* 11/25/2014   PROT 4.8* 03/04/2016   PROT 6.6 11/25/2014   ALBUMIN 2.8* 03/07/2016   ALBUMIN 3.3* 11/25/2014   BILITOT 0.7 03/04/2016   BILITOT 0.4 11/25/2014   ALKPHOS 117 03/04/2016   ALKPHOS 69 11/25/2014   AST 32 03/04/2016   AST 103* 11/25/2014   ALT 14 03/04/2016   ALT 79* 11/25/2014  .  Micro Results No results found for this or any previous visit (from the past 240 hour(s)).      Code Status Orders        Start     Ordered   03/04/16 0605  Full code  Continuous     03/04/16 0604    Code Status History    Date Active Date Inactive Code Status Order ID Comments User Context   02/25/2016  3:36 PM 03/01/2016  3:06 PM Full Code [id number]  Enedina Finner, MD Inpatient          Follow-up Information    Follow up with Lynnae Prude, MD In 2 weeks.   Specialty:  Gastroenterology   Why:  gi bleed   Contact information:   [address] [facility] - GASTROENTEROLOGY [place] [address] [phone]       Discharge Medications     Medication List    TAKE these medications        albuterol 108 (90 Base) MCG/ACT inhaler  Commonly known as:  PROVENTIL HFA;VENTOLIN HFA  Inhale 2 puffs into the lungs every 6 (six) hours as needed for wheezing or shortness of breath.     albuterol (2.5 MG/3ML) 0.083%  nebulizer solution  Commonly known as:  PROVENTIL  Take 2.5 mg by nebulization every 6 (six) hours as needed for wheezing or shortness of breath.     budesonide-formoterol 80-4.5 MCG/ACT inhaler  Commonly known as:  SYMBICORT  Inhale 2 puffs into the lungs 2 (two) times daily.     cyclobenzaprine 10 MG tablet  Commonly known as:  FLEXERIL  Take 10 mg by mouth 3 (three) times daily as needed for muscle spasms.     docusate sodium 100 MG capsule  Commonly known as:  COLACE  Take 2 capsules (200 mg total) by mouth 2 (two) times daily.     EQL ADVANCED RECOVERY Lotn  Apply 1 application topically 2 (two) times daily.     estradiol 0.1 MG/GM vaginal cream  Commonly known as:  ESTRACE  Place 1 Applicatorful vaginally 2 (two) times a week.     fenofibrate 145 MG tablet  Commonly known as:  TRICOR  Take 145 mg by mouth every other day.     fludrocortisone 0.1 MG tablet  Commonly known as:  FLORINEF  Take 0.1 mg by mouth daily.     folic acid-vitamin b complex-vitamin c-selenium-zinc 3 MG Tabs tablet  Take 1 tablet by mouth daily.     furosemide 40 MG tablet  Commonly known as:  LASIX  Take 40 mg by mouth 2 (two) times daily.     gabapentin 100 MG capsule  Commonly known as:  NEURONTIN  Take 400 mg by mouth at bedtime.     guaifenesin 100 MG/5ML syrup  Commonly known as:  ROBITUSSIN  Take 200 mg by mouth every 6 (six) hours as needed for cough.     guaiFENesin 600 MG 12 hr tablet  Commonly known as:  MUCINEX  Take 1 tablet (600 mg total) by mouth 2 (two) times daily as needed.     hydrocortisone 10 MG tablet  Commonly known as:  CORTEF  Take 5-10 mg by mouth 2 (two) times daily. Pt takes 10 mg in the morning and 5 mg at bedtime.     HYDROmorphone 4 MG tablet  Commonly known as:  DILAUDID  Take 1.5 tablets (6 mg total) by mouth every 6 (six) hours as needed for severe pain.     hydrOXYzine 25 MG tablet  Commonly known as:  ATARAX/VISTARIL  Take 25 mg by mouth 3  (three) times daily.     lactulose 10 GM/15ML solution  Commonly known as:  CHRONULAC  Take 45 mLs (30 g total)  by mouth 2 (two) times daily.     levothyroxine 112 MCG tablet  Commonly known as:  SYNTHROID, LEVOTHROID  Take 112 mcg by mouth daily.     loratadine 10 MG tablet  Commonly known as:  CLARITIN  Take 10 mg by mouth daily.     lovastatin 40 MG tablet  Commonly known as:  MEVACOR  Take 20 mg by mouth every evening.     Melatonin 5 MG Tabs  Take 2 tablets by mouth at bedtime.     methylphenidate 20 MG tablet  Commonly known as:  RITALIN  Take 20-30 mg by mouth 2 (two) times daily. Pt takes 30 mg in the morning and 20 mg at lunch.     multivitamin with minerals Tabs tablet  Take 1 tablet by mouth daily.     ondansetron 8 MG disintegrating tablet  Commonly known as:  ZOFRAN-ODT  Take 8 mg by mouth every 8 (eight) hours as needed for nausea or vomiting.     Oxycodone HCl 10 MG Tabs  Take 1 tablet (10 mg total) by mouth every 8 (eight) hours as needed (for pain).     pantoprazole 40 MG tablet  Commonly known as:  PROTONIX  Take 1 tablet (40 mg total) by mouth daily.     polyethylene glycol packet  Commonly known as:  MIRALAX / GLYCOLAX  Take 17 g by mouth daily as needed for mild constipation.     risperidone 4 MG tablet  Commonly known as:  RISPERDAL  Take 2-4 mg by mouth 2 (two) times daily. Pt takes 4 mg in the morning and 2 mg at bedtime.     senna 8.6 MG Tabs tablet  Commonly known as:  SENOKOT  Take 2 tablets (17.2 mg total) by mouth daily.     sertraline 50 MG tablet  Commonly known as:  ZOLOFT  Take 50 mg by mouth daily.     sevelamer carbonate 800 MG tablet  Commonly known as:  RENVELA  Take 4,000 mg by mouth 3 (three) times daily with meals.     topiramate 200 MG tablet  Commonly known as:  TOPAMAX  Take 200 mg by mouth 2 (two) times daily. Pt takes with a 50 mg tablet.     topiramate 50 MG tablet  Commonly known as:  TOPAMAX  Take 50 mg  by mouth 2 (two) times daily. Pt takes with a 200 mg tablet.     Vitamin D (Ergocalciferol) 50000 units Caps capsule  Commonly known as:  DRISDOL  Take 50,000 Units by mouth every 30 (thirty) days.           Total Time in preparing paper work, data evaluation and todays exam - 35 minutes  [name] M.D on 03/07/2016 at 1:06 PM  [facility]   Office  [phone]

## 2016-03-07 NOTE — Progress Notes (Signed)
Post dialysis assessment 

## 2016-03-07 NOTE — Consult Note (Signed)
GI Inpatient Follow-up Note  Patient Identification: Erin [name] is a 45 y.o. female with anemia, BRBPR  Subjective: Patient was seen during dialysis.  She reports she is doing better today.  Denies abdominal pain.  Reports several BM's yesterday, no reports of blood seen.  She had a Meckel scan, negative.  Scheduled Inpatient Medications:  . docusate sodium  200 mg Oral BID  . epoetin (EPOGEN/PROCRIT) injection  10,000 Units Intravenous Q M,W,F-HD  . fludrocortisone  0.1 mg Oral Daily  . hydrocortisone  10 mg Oral Daily  . hydrocortisone  5 mg Oral QHS  . lactulose  30 g Oral BID  . levothyroxine  112 mcg Oral QAC breakfast  . mometasone-formoterol  2 puff Inhalation BID  . pantoprazole  40 mg Oral Daily  . polyethylene glycol  17 g Oral Daily  . pravastatin  20 mg Oral q1800  . risperiDONE  2 mg Oral QHS  . risperiDONE  4 mg Oral q morning - 10a  . senna  2 tablet Oral Daily  . sertraline  50 mg Oral Daily  . sevelamer carbonate  4,000 mg Oral TID WC  . sodium chloride flush  3 mL Intravenous Q12H  . topiramate  200 mg Oral BID  . topiramate  50 mg Oral BID    Continuous Inpatient Infusions:     PRN Inpatient Medications:  acetaminophen **OR** acetaminophen, albuterol, HYDROmorphone, ondansetron **OR** ondansetron (ZOFRAN) IV  Review of Systems: Constitutional: Weight is stable.  Eyes: Legally blind ENT: No oral lesions, sore throat.  GI: see HPI.  Heme/Lymph: No easy bruising.  CV: No chest pain.  GU: No hematuria.  Integumentary: No rashes.  Neuro: No headaches.  Psych: No depression/anxiety.  Endocrine: No heat/cold intolerance.  Allergic/Immunologic: No urticaria.  Resp: No cough, SOB.  Musculoskeletal: No joint swelling.    Physical Examination: BP 163/69 mmHg  Pulse 62  Temp(Src) 98.1 F (36.7 C) (Oral)  Resp 15  Ht 4\' 10"  (1.473 m)  Wt 70 kg (154 lb 5.2 oz)  BMI 32.26 kg/m2  SpO2 99%  LMP 11/21/2007 (Approximate) Gen: NAD, alert and  oriented x 4 HEENT: Legally blind Neck: supple, no JVD or thyromegaly Chest: CTA bilaterally, no wheezes, crackles, or other adventitious sounds CV: RRR, no m/g/c/r Abd: soft, NT, ND, +BS in all four quadrants; no HSM, guarding, ridigity, or rebound tenderness Ext: no edema, well perfused with 2+ pulses, Skin: no rash or lesions noted Lymph: no LAD  Data: Lab Results  Component Value Date   WBC 2.0* 03/07/2016   HGB 7.9* 03/07/2016   HCT 24.0* 03/07/2016   MCV 95.6 03/07/2016   PLT 177 03/07/2016    Recent Labs Lab 03/05/16 1018 03/06/16 0725 03/07/16 1058  HGB 9.0* 8.0* 7.9*   Lab Results  Component Value Date   NA 135 03/07/2016   K 3.4* 03/07/2016   CL 98* 03/07/2016   CO2 24 03/07/2016   BUN 44* 03/07/2016   CREATININE 5.16* 03/07/2016   Lab Results  Component Value Date   ALT 14 03/04/2016   AST 32 03/04/2016   ALKPHOS 117 03/04/2016   BILITOT 0.7 03/04/2016   No results for input(s): APTT, INR, PTT in the last 168 hours. Imaging: CLINICAL DATA: Intermittent GI bleeding. Weakness. Jaundice.  EXAM: NUCLEAR MEDICINE MECKELS SCAN  TECHNIQUE: Sequential abdominal images were obtained following intravenous injection of radiopharmaceutical.  RADIOPHARMACEUTICALS: 17.3 millicuries technetium pertechnetate  COMPARISON: CT abdomen 01/12/2013  FINDINGS: There is no abnormal accumulation of radiotracer  within the abdomen or pelvis to localize ectopic gastric mucosa (Meckel's diverticulum). Physiologic uptake noted the stomach and proximal small bowel and blood pool.  IMPRESSION: No scintigraphic localization of ectopic gastric mucosa (Meckel's diverticulum). Negative Meckel's scan.   Electronically Signed  By: Genevive Bi M.D.  On: 03/07/2016 10:49    Assessment/Plan: Ms. Witter is a 45 y.o. female with anemia, BRBPR.  No further bleeding.  Hgb stable.  She has had full GI workup, all negative.  Patient is being discharged  today.  Recommendations: No further GI recommendations at this time.   Please call with questions or concerns.  Carney Harder, PA-C  I personally performed these services.

## 2016-03-07 NOTE — Progress Notes (Signed)
Treatment initiation assessment 

## 2016-03-07 NOTE — NC FL2 (Signed)
MEDICAID FL2 LEVEL OF CARE SCREENING TOOL     IDENTIFICATION  Patient Name: Erin [name] Birthdate: 01-Oct-1971 Sex: female Admission Date (Current Location): 03/03/2016  [place] and [place] Number:  Chiropodist and Address:  [facility], [address], [place], [place] [address]      Provider Number: [id number]  Attending Physician Name and Address:  [name], MD  Relative Name and Phone Number:       Current Level of Care: Hospital Recommended Level of Care: Assisted Living Facility Prior Approval Number:    Date Approved/Denied:   PASRR Number:  ( [id number] )  Discharge Plan: Domiciliary (Rest home)    Current Diagnoses: Patient Active Problem List   Diagnosis Date Noted  . GI bleed 03/04/2016  . Generalized weakness 03/04/2016  . Hypothyroidism 03/04/2016  . GERD (gastroesophageal reflux disease) 03/04/2016  . ESRD on dialysis (HCC) 03/04/2016  . Bipolar 1 disorder, mixed, moderate (HCC) 02/26/2016  . 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 Anemia 02/25/2016    Orientation RESPIRATION BLADDER Height & Weight     Self, Time, Situation, Place  Normal Continent Weight: 154 lb 5.2 oz (70 kg) (bed scale) Height:  4\' 10"  (147.3 cm)  BEHAVIORAL SYMPTOMS/MOOD NEUROLOGICAL BOWEL NUTRITION STATUS   (none )  (none ) Continent Renal Diet   AMBULATORY STATUS COMMUNICATION OF NEEDS Skin   Supervision Verbally Normal                       Personal Care Assistance Level of Assistance  Bathing, Feeding, Dressing Bathing Assistance: Independent Feeding assistance: Independent Dressing Assistance: Independent     Functional Limitations Info  Sight, Hearing, Speech Sight Info: Impaired (Patient is blind ) Hearing Info: Adequate Speech Info: Adequate    SPECIAL CARE FACTORS FREQUENCY                       Contractures       Additional Factors Info  Code Status, Allergies, Psychotropic Code Status Info:  (Full Code. ) Allergies Info:  (Dhea, Demerol, Floxin, Nsaids, Nubain, Phenergan, Stadol, Erythromycin) Psychotropic Info:  (Risperdal )        Discharge Medications: Please see discharge summary for a list of discharge medications. Discharge Medications     Medication List    TAKE these medications       albuterol 108 (90 Base) MCG/ACT inhaler  Commonly known as: PROVENTIL HFA;VENTOLIN HFA  Inhale 2 puffs into the lungs every 6 (six) hours as needed for wheezing or shortness of breath.     albuterol (2.5 MG/3ML) 0.083% nebulizer solution  Commonly known as: PROVENTIL  Take 2.5 mg by nebulization every 6 (six) hours as needed for wheezing or shortness of breath.     budesonide-formoterol 80-4.5 MCG/ACT inhaler  Commonly known as: SYMBICORT  Inhale 2 puffs into the lungs 2 (two) times daily.     cyclobenzaprine 10 MG tablet  Commonly known as: FLEXERIL  Take 10 mg by mouth 3 (three) times daily as needed for muscle spasms.     docusate sodium 100 MG capsule  Commonly known as: COLACE  Take 2 capsules (200 mg total) by mouth 2 (two) times daily.     EQL ADVANCED RECOVERY Lotn  Apply 1 application topically 2 (two) times daily.     estradiol 0.1 MG/GM vaginal cream  Commonly known as: ESTRACE  Place 1  Applicatorful vaginally 2 (two) times a week.     fenofibrate 145 MG tablet  Commonly known as: TRICOR  Take 145 mg by mouth every other day.     fludrocortisone 0.1 MG tablet  Commonly known as: FLORINEF  Take 0.1 mg by mouth daily.     folic acid-vitamin b complex-vitamin c-selenium-zinc 3 MG Tabs tablet  Take 1 tablet by mouth daily.     furosemide 40 MG tablet  Commonly known as: LASIX  Take 40 mg by mouth 2 (two) times daily.     gabapentin 100 MG capsule  Commonly known as: NEURONTIN  Take 400 mg  by mouth at bedtime.     guaifenesin 100 MG/5ML syrup  Commonly known as: ROBITUSSIN  Take 200 mg by mouth every 6 (six) hours as needed for cough.     guaiFENesin 600 MG 12 hr tablet  Commonly known as: MUCINEX  Take 1 tablet (600 mg total) by mouth 2 (two) times daily as needed.     hydrocortisone 10 MG tablet  Commonly known as: CORTEF  Take 5-10 mg by mouth 2 (two) times daily. Pt takes 10 mg in the morning and 5 mg at bedtime.     HYDROmorphone 4 MG tablet  Commonly known as: DILAUDID  Take 1.5 tablets (6 mg total) by mouth every 6 (six) hours as needed for severe pain.     hydrOXYzine 25 MG tablet  Commonly known as: ATARAX/VISTARIL  Take 25 mg by mouth 3 (three) times daily.     lactulose 10 GM/15ML solution  Commonly known as: CHRONULAC  Take 45 mLs (30 g total) by mouth 2 (two) times daily.     levothyroxine 112 MCG tablet  Commonly known as: SYNTHROID, LEVOTHROID  Take 112 mcg by mouth daily.     loratadine 10 MG tablet  Commonly known as: CLARITIN  Take 10 mg by mouth daily.     lovastatin 40 MG tablet  Commonly known as: MEVACOR  Take 20 mg by mouth every evening.     Melatonin 5 MG Tabs  Take 2 tablets by mouth at bedtime.     methylphenidate 20 MG tablet  Commonly known as: RITALIN  Take 20-30 mg by mouth 2 (two) times daily. Pt takes 30 mg in the morning and 20 mg at lunch.     multivitamin with minerals Tabs tablet  Take 1 tablet by mouth daily.     ondansetron 8 MG disintegrating tablet  Commonly known as: ZOFRAN-ODT  Take 8 mg by mouth every 8 (eight) hours as needed for nausea or vomiting.     Oxycodone HCl 10 MG Tabs  Take 1 tablet (10 mg total) by mouth every 8 (eight) hours as needed (for pain).     pantoprazole 40 MG tablet  Commonly known as: PROTONIX  Take 1 tablet (40 mg total) by mouth daily.     polyethylene glycol packet  Commonly known as:  MIRALAX / GLYCOLAX  Take 17 g by mouth daily as needed for mild constipation.     risperidone 4 MG tablet  Commonly known as: RISPERDAL  Take 2-4 mg by mouth 2 (two) times daily. Pt takes 4 mg in the morning and 2 mg at bedtime.     senna 8.6 MG Tabs tablet  Commonly known as: SENOKOT  Take 2 tablets (17.2 mg total) by mouth daily.     sertraline 50 MG tablet  Commonly known as: ZOLOFT  Take 50 mg by mouth daily.  sevelamer carbonate 800 MG tablet  Commonly known as: RENVELA  Take 4,000 mg by mouth 3 (three) times daily with meals.     topiramate 200 MG tablet  Commonly known as: TOPAMAX  Take 200 mg by mouth 2 (two) times daily. Pt takes with a 50 mg tablet.     topiramate 50 MG tablet  Commonly known as: TOPAMAX  Take 50 mg by mouth 2 (two) times daily. Pt takes with a 200 mg tablet.     Vitamin D (Ergocalciferol) 50000 units Caps capsule  Commonly known as: DRISDOL  Take 50,000 Units by mouth every 30 (thirty) days.           Relevant Imaging Results:  Relevant Lab Results:   Additional Information    Haig Prophet, LCSW

## 2016-03-07 NOTE — Progress Notes (Signed)
Patient education provided concerning Meckel scan scheduled for today. Pt verbalized understanding.

## 2016-03-08 ENCOUNTER — Encounter: Payer: Self-pay | Admitting: Gastroenterology

## 2016-03-23 ENCOUNTER — Emergency Department
Admission: EM | Admit: 2016-03-23 | Discharge: 2016-03-23 | Disposition: A | Discharge status: Home / Self Care | Payer: Medicare Other | Attending: Emergency Medicine | Admitting: Emergency Medicine

## 2016-03-23 ENCOUNTER — Encounter: Payer: Self-pay | Admitting: Emergency Medicine

## 2016-03-23 DIAGNOSIS — E785 Hyperlipidemia, unspecified: Secondary | ICD-10-CM | POA: Insufficient documentation

## 2016-03-23 DIAGNOSIS — Z8673 Personal history of transient ischemic attack (TIA), and cerebral infarction without residual deficits: Secondary | ICD-10-CM | POA: Diagnosis not present

## 2016-03-23 DIAGNOSIS — Z7952 Long term (current) use of systemic steroids: Secondary | ICD-10-CM | POA: Insufficient documentation

## 2016-03-23 DIAGNOSIS — R531 Weakness: Secondary | ICD-10-CM | POA: Diagnosis present

## 2016-03-23 DIAGNOSIS — D649 Anemia, unspecified: Secondary | ICD-10-CM | POA: Diagnosis not present

## 2016-03-23 DIAGNOSIS — Z8679 Personal history of other diseases of the circulatory system: Secondary | ICD-10-CM | POA: Insufficient documentation

## 2016-03-23 DIAGNOSIS — J45909 Unspecified asthma, uncomplicated: Secondary | ICD-10-CM | POA: Insufficient documentation

## 2016-03-23 DIAGNOSIS — Z79899 Other long term (current) drug therapy: Secondary | ICD-10-CM | POA: Diagnosis not present

## 2016-03-23 DIAGNOSIS — N186 End stage renal disease: Secondary | ICD-10-CM | POA: Insufficient documentation

## 2016-03-23 DIAGNOSIS — F3162 Bipolar disorder, current episode mixed, moderate: Secondary | ICD-10-CM | POA: Insufficient documentation

## 2016-03-23 DIAGNOSIS — E039 Hypothyroidism, unspecified: Secondary | ICD-10-CM | POA: Diagnosis not present

## 2016-03-23 DIAGNOSIS — F17210 Nicotine dependence, cigarettes, uncomplicated: Secondary | ICD-10-CM | POA: Diagnosis not present

## 2016-03-23 DIAGNOSIS — Z992 Dependence on renal dialysis: Secondary | ICD-10-CM | POA: Insufficient documentation

## 2016-03-23 LAB — COMPREHENSIVE METABOLIC PANEL
ALBUMIN: 3.2 g/dL — AB (ref 3.5–5.0)
ALK PHOS: 122 U/L (ref 38–126)
ALT: 17 U/L (ref 14–54)
AST: 33 U/L (ref 15–41)
Anion gap: 10 (ref 5–15)
BILIRUBIN TOTAL: 0.8 mg/dL (ref 0.3–1.2)
BUN: 50 mg/dL — AB (ref 6–20)
CALCIUM: 8 mg/dL — AB (ref 8.9–10.3)
CO2: 27 mmol/L (ref 22–32)
CREATININE: 6.36 mg/dL — AB (ref 0.44–1.00)
Chloride: 103 mmol/L (ref 101–111)
GFR calc Af Amer: 8 mL/min — ABNORMAL LOW (ref >60)
GFR calc non Af Amer: 7 mL/min — ABNORMAL LOW (ref >60)
GLUCOSE: 60 mg/dL — AB (ref 65–99)
Potassium: 4.4 mmol/L (ref 3.5–5.1)
Sodium: 140 mmol/L (ref 135–145)
TOTAL PROTEIN: 5.9 g/dL — AB (ref 6.5–8.1)

## 2016-03-23 LAB — CBC
HEMATOCRIT: 27.5 % — AB (ref 35.0–47.0)
Hemoglobin: 9.1 g/dL — ABNORMAL LOW (ref 12.0–16.0)
MCH: 31.4 pg (ref 26.0–34.0)
MCHC: 33 g/dL (ref 32.0–36.0)
MCV: 95.1 fL (ref 80.0–100.0)
Platelets: 183 10*3/uL (ref 150–440)
RBC: 2.89 MIL/uL — ABNORMAL LOW (ref 3.80–5.20)
RDW: 18.2 % — ABNORMAL HIGH (ref 11.5–14.5)
WBC: 5.1 10*3/uL (ref 3.6–11.0)

## 2016-03-23 LAB — TROPONIN I: Troponin I: <0.03 ng/mL (ref <0.031)

## 2016-03-23 NOTE — ED Provider Notes (Signed)
[facility] Emergency Department Provider Note  ____________________________________________  Time seen: 12:30 AM  I have reviewed the triage vital signs and the nursing notes.   HISTORY  Chief Complaint Weakness      HPI Erin [name] is a 45 y.o. female presents with request for blood transfusion. Patient states she believes she needs a blood transfusion secondary to the fact that she has generalized weakness which she states has been occurring to her for "a long time" patient states that the weakness is unchanging comparison to previous episodes for which she's been seen in the emergency department. Patient denies any headache no nausea no vomiting or diarrhea    Past Medical History  Diagnosis Date  . Fibromyalgia   . Chronic headaches     Seeing Pain Management  . Hyperlipidemia   . Psychosis   . Hypothyroid   . Narcolepsy   . Asthma   . Constipation   . Kidney failure     Currently on Dialysis  . Blind   . Epilepsy (HCC)   . History of DVT (deep vein thrombosis)   . History of CVA (cerebrovascular accident)   . Depression   . Bipolar affect, depressed (HCC)   . Anxiety     Patient Active Problem List   Diagnosis Date Noted  . GI bleed 03/04/2016  . Generalized weakness 03/04/2016  . Hypothyroidism 03/04/2016  . GERD (gastroesophageal reflux disease) 03/04/2016  . ESRD on dialysis (HCC) 03/04/2016  . Bipolar 1 disorder, mixed, moderate (HCC) 02/26/2016  . 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 Anemia 02/25/2016    Past Surgical History  Procedure Laterality Date  . Brain surgery  1976  . Cholecystectomy  2001  . Breast lumpectomy  2001  . Pituitary excision  1976  . Dg av dialysis graft declot or      X 4  . Peripheral vascular catheterization N/A 08/21/2015    Procedure: Dialysis/Perma Catheter Insertion;  Surgeon: Renford Dills, MD;  Location: [facility]  INVASIVE CV LAB;  Service: Cardiovascular;  Laterality: N/A;  . Peripheral vascular catheterization N/A 02/29/2016    Procedure: Dialysis/Perma Catheter Removal;  Surgeon: Annice Needy, MD;  Location: [facility] INVASIVE CV LAB;  Service: Cardiovascular;  Laterality: N/A;  . Givens capsule study N/A 02/29/2016    Procedure: GIVENS CAPSULE STUDY;  Surgeon: Elnita Maxwell, MD;  Location: [facility] ENDOSCOPY;  Service: Endoscopy;  Laterality: N/A;    Current Outpatient Rx  Name  Route  Sig  Dispense  Refill  . albuterol (PROVENTIL HFA;VENTOLIN HFA) 108 (90 BASE) MCG/ACT inhaler   Inhalation   Inhale 2 puffs into the lungs every 6 (six) hours as needed for wheezing or shortness of breath.         Marland Kitchen albuterol (PROVENTIL) (2.5 MG/3ML) 0.083% nebulizer solution   Nebulization   Take 2.5 mg by nebulization every 6 (six) hours as needed for wheezing or shortness of breath.         . budesonide-formoterol (SYMBICORT) 80-4.5 MCG/ACT inhaler   Inhalation   Inhale 2 puffs into the lungs 2 (two) times daily.         . fenofibrate (TRICOR) 145 MG tablet   Oral   Take 145 mg by mouth every other day.          . fludrocortisone (FLORINEF) 0.1 MG tablet   Oral   Take 0.1 mg by mouth daily.         Marland Kitchen  folic acid-vitamin b complex-vitamin c-selenium-zinc (DIALYVITE) 3 MG TABS tablet   Oral   Take 1 tablet by mouth daily.         . furosemide (LASIX) 40 MG tablet   Oral   Take 40 mg by mouth 2 (two) times daily.         Marland Kitchen guaiFENesin (MUCINEX) 600 MG 12 hr tablet   Oral   Take 1 tablet (600 mg total) by mouth 2 (two) times daily as needed. Patient taking differently: Take 600 mg by mouth 2 (two) times daily as needed for cough or to loosen phlegm.    60 tablet   2   . guaifenesin (ROBITUSSIN) 100 MG/5ML syrup   Oral   Take 200 mg by mouth every 6 (six) hours as needed for cough.         . hydrocortisone (CORTEF) 10 MG tablet   Oral   Take 10 mg by mouth daily. Pt takes 10 mg in the  morning and 5 mg at bedtime.         Marland Kitchen levothyroxine (SYNTHROID, LEVOTHROID) 112 MCG tablet   Oral   Take 112 mcg by mouth daily.          Marland Kitchen loratadine (CLARITIN) 10 MG tablet   Oral   Take 10 mg by mouth daily.         Marland Kitchen lovastatin (MEVACOR) 40 MG tablet   Oral   Take 20 mg by mouth every evening.          . Melatonin 5 MG TABS   Oral   Take 2 tablets by mouth at bedtime.         . methylphenidate (RITALIN) 20 MG tablet   Oral   Take 20-30 mg by mouth 2 (two) times daily. Pt takes 30 mg in the morning and 20 mg at lunch.         . Multiple Vitamin (MULTIVITAMIN WITH MINERALS) TABS tablet   Oral   Take 1 tablet by mouth daily.         . pantoprazole (PROTONIX) 40 MG tablet   Oral   Take 1 tablet (40 mg total) by mouth daily.   30 tablet   0   . polyethylene glycol (MIRALAX / GLYCOLAX) packet   Oral   Take 17 g by mouth daily as needed for mild constipation.          . risperidone (RISPERDAL) 4 MG tablet   Oral   Take 2-4 mg by mouth 2 (two) times daily. Pt takes 4 mg in the morning and 2 mg at bedtime.         . senna (SENOKOT) 8.6 MG TABS tablet   Oral   Take 2 tablets (17.2 mg total) by mouth daily.   120 each   0   . sertraline (ZOLOFT) 50 MG tablet   Oral   Take 50 mg by mouth daily.         . sevelamer carbonate (RENVELA) 800 MG tablet   Oral   Take 4,000 mg by mouth 3 (three) times daily with meals.         . topiramate (TOPAMAX) 200 MG tablet   Oral   Take 200 mg by mouth 2 (two) times daily. Pt takes with a 50 mg tablet.         . topiramate (TOPAMAX) 50 MG tablet   Oral   Take 50 mg by mouth 2 (two) times daily. Pt takes with  a 200 mg tablet.         . Vitamin D, Ergocalciferol, (DRISDOL) 50000 UNITS CAPS capsule   Oral   Take 50,000 Units by mouth every 30 (thirty) days. Takes on the 8th of every month         . docusate sodium (COLACE) 100 MG capsule   Oral   Take 2 capsules (200 mg total) by mouth 2 (two)  times daily. Patient not taking: Reported on 03/23/2016   10 capsule   0   . gabapentin (NEURONTIN) 100 MG capsule   Oral   Take 400 mg by mouth at bedtime.         Marland Kitchen HYDROmorphone (DILAUDID) 4 MG tablet   Oral   Take 1.5 tablets (6 mg total) by mouth every 6 (six) hours as needed for severe pain. Patient not taking: Reported on 03/23/2016   30 tablet   0   . lactulose (CHRONULAC) 10 GM/15ML solution   Oral   Take 45 mLs (30 g total) by mouth 2 (two) times daily. Patient not taking: Reported on 03/23/2016   240 mL   0   . Oxycodone HCl 10 MG TABS   Oral   Take 1 tablet (10 mg total) by mouth every 8 (eight) hours as needed (for pain). Patient not taking: Reported on 03/23/2016   40 tablet   0     Allergies Dhea; Demerol; Floxin; Nsaids; Nubain; Phenergan; Stadol; and Erythromycin  Family History  Problem Relation Age of Onset  . Diabetes Father   . Heart attack Mother   . Migraines Mother   . Aneurysm Mother     Social History Social History  Substance Use Topics  . Smoking status: Current Every Day Smoker -- 0.50 packs/day for 16 years    Types: Cigarettes  . Smokeless tobacco: None  . Alcohol Use: No     Comment: occasionally    Review of Systems  Constitutional: Negative for fever. Eyes: Negative for visual changes. ENT: Negative for sore throat. Cardiovascular: Negative for chest pain. Respiratory: Negative for shortness of breath. Gastrointestinal: Negative for abdominal pain, vomiting and diarrhea. Genitourinary: Negative for dysuria. Musculoskeletal: Negative for back pain. Skin: Negative for rash. Neurological: Negative for headaches,Positive for generalized weakness   10-point ROS otherwise negative.  ____________________________________________   PHYSICAL EXAM:  VITAL SIGNS: ED Triage Vitals  Enc Vitals Group     BP 03/23/16 0015 139/78 mmHg     Pulse Rate 03/23/16 0015 74     Resp 03/23/16 0015 15     Temp 03/23/16 0015 98.1 F  (36.7 C)     Temp Source 03/23/16 0015 Oral     SpO2 03/23/16 0007 99 %     Weight 03/23/16 0015 161 lb (73.029 kg)     Height 03/23/16 0015 4\' 10"  (1.473 m)     Head Cir --      Peak Flow --      Pain Score --      Pain Loc --      Pain Edu? --      Excl. in GC? --      Constitutional: Alert and oriented. Well appearing and in no distress. Eyes: Conjunctivae are normal. PERRL. Normal extraocular movements. ENT   Head: Normocephalic and atraumatic.   Nose: No congestion/rhinnorhea.   Mouth/Throat: Mucous membranes are moist.   Neck: No stridor. Hematological/Lymphatic/Immunilogical: No cervical lymphadenopathy. Cardiovascular: Normal rate, regular rhythm. Normal and symmetric distal pulses are present in all  extremities. No murmurs, rubs, or gallops. Respiratory: Normal respiratory effort without tachypnea nor retractions. Breath sounds are clear and equal bilaterally. No wheezes/rales/rhonchi. Gastrointestinal: Soft and nontender. No distention. There is no CVA tenderness. Genitourinary: deferred Musculoskeletal: Nontender with normal range of motion in all extremities. No joint effusions.  No lower extremity tenderness nor edema. Neurologic:  Normal speech and language. No gross focal neurologic deficits are appreciated. Speech is normal.  Skin:  Skin is warm, dry and intact. No rash noted. Psychiatric: Mood and affect are normal. Speech and behavior are normal. Patient exhibits appropriate insight and judgment.  ____________________________________________    LABS (pertinent positives/negatives)  Labs Reviewed  CBC - Abnormal; Notable for the following:    RBC 2.89 (*)    Hemoglobin 9.1 (*)    HCT 27.5 (*)    RDW 18.2 (*)    All other components within normal limits  COMPREHENSIVE METABOLIC PANEL - Abnormal; Notable for the following:    Glucose, Bld 60 (*)    BUN 50 (*)    Creatinine, Ser 6.36 (*)    Calcium 8.0 (*)    Total Protein 5.9 (*)    Albumin  3.2 (*)    GFR calc non Af Amer 7 (*)    GFR calc Af Amer 8 (*)    All other components within normal limits  TROPONIN I        INITIAL IMPRESSION / ASSESSMENT AND PLAN / ED COURSE  Pertinent labs & imaging results that were available during my care of the patient were reviewed by me and considered in my medical decision making (see chart for details).  Patient's hemoglobin improved from previous visit 9.1 today previously 7. patient states that she has a primary care appointment scheduled with Dr. Thedore Mins on the 26th I advised her to keep that appointment.  ____________________________________________   FINAL CLINICAL IMPRESSION(S) / ED DIAGNOSES  Final diagnoses:  Anemia, unspecified anemia type  Generalized weakness      Darci Current, MD 03/23/16 [phone]

## 2016-03-23 NOTE — ED Notes (Signed)
Attempted to call [facility] x3 for report of pt returning, No answer x3.

## 2016-03-23 NOTE — ED Notes (Signed)
Pt arrived to ED by EMS with complaints of weakness and dizziness x5 days. Pt states "I need a blood transfusion." Pt able to move bed to bed without assistance and no acute distress noted. EMS vitals WNL.

## 2016-03-23 NOTE — Discharge Instructions (Signed)
Weakness Weakness is a lack of strength. It may be felt all over the body (generalized) or in one specific part of the body (focal). Some causes of weakness can be serious. You may need further medical evaluation, especially if you are elderly or you have a history of immunosuppression (such as chemotherapy or HIV), kidney disease, heart disease, or diabetes. CAUSES  Weakness can be caused by many different things, including:  Infection.  Physical exhaustion.  Internal bleeding or other blood loss that results in a lack of red blood cells (anemia).  Dehydration. This cause is more common in elderly people.  Side effects or electrolyte abnormalities from medicines, such as pain medicines or sedatives.  Emotional distress, anxiety, or depression.  Circulation problems, especially severe peripheral arterial disease.  Heart disease, such as rapid atrial fibrillation, bradycardia, or heart failure.  Nervous system disorders, such as Guillain-Barr syndrome, multiple sclerosis, or stroke. DIAGNOSIS  To find the cause of your weakness, your caregiver will take your history and perform a physical exam. Lab tests or X-rays may also be ordered, if needed. TREATMENT  Treatment of weakness depends on the cause of your symptoms and can vary greatly. HOME CARE INSTRUCTIONS   Rest as needed.  Eat a well-balanced diet.  Try to get some exercise every day.  Only take over-the-counter or prescription medicines as directed by your caregiver. SEEK MEDICAL CARE IF:   Your weakness seems to be getting worse or spreads to other parts of your body.  You develop new aches or pains. SEEK IMMEDIATE MEDICAL CARE IF:   You cannot perform your normal daily activities, such as getting dressed and feeding yourself.  You cannot walk up and down stairs, or you feel exhausted when you do so.  You have shortness of breath or chest pain.  You have difficulty moving parts of your body.  You have weakness  in only one area of the body or on only one side of the body.  You have a fever.  You have trouble speaking or swallowing.  You cannot control your bladder or bowel movements.  You have black or bloody vomit or stools. MAKE SURE YOU:  Understand these instructions.  Will watch your condition.  Will get help right away if you are not doing well or get worse.   This information is not intended to replace advice given to you by your health care provider. Make sure you discuss any questions you have with your health care provider.   Document Released: 10/17/2005 Document Revised: 04/17/2012 Document Reviewed: 12/16/2011 Elsevier Interactive Patient Education 2016 [employer].  Anemia, Nonspecific Anemia is a condition in which the concentration of red blood cells or hemoglobin in the blood is below normal. Hemoglobin is a substance in red blood cells that carries oxygen to the tissues of the body. Anemia results in not enough oxygen reaching these tissues.  CAUSES  Common causes of anemia include:   Excessive bleeding. Bleeding may be internal or external. This includes excessive bleeding from periods (in women) or from the intestine.   Poor nutrition.   Chronic kidney, thyroid, and liver disease.  Bone marrow disorders that decrease red blood cell production.  Cancer and treatments for cancer.  HIV, AIDS, and their treatments.  Spleen problems that increase red blood cell destruction.  Blood disorders.  Excess destruction of red blood cells due to infection, medicines, and autoimmune disorders. SIGNS AND SYMPTOMS   Minor weakness.   Dizziness.   Headache.  Palpitations.  Shortness of breath, especially with exercise.   Paleness.  Cold sensitivity.  Indigestion.  Nausea.  Difficulty sleeping.  Difficulty concentrating. Symptoms may occur suddenly or they may develop slowly.  DIAGNOSIS  Additional blood tests are often needed. These help your  health care provider determine the best treatment. Your health care provider will check your stool for blood and look for other causes of blood loss.  TREATMENT  Treatment varies depending on the cause of the anemia. Treatment can include:   Supplements of iron, vitamin B12, or folic acid.   Hormone medicines.   A blood transfusion. This may be needed if blood loss is severe.   Hospitalization. This may be needed if there is significant continual blood loss.   Dietary changes.  Spleen removal. HOME CARE INSTRUCTIONS Keep all follow-up appointments. It often takes many weeks to correct anemia, and having your health care provider check on your condition and your response to treatment is very important. SEEK IMMEDIATE MEDICAL CARE IF:   You develop extreme weakness, shortness of breath, or chest pain.   You become dizzy or have trouble concentrating.  You develop heavy vaginal bleeding.   You develop a rash.   You have bloody or black, tarry stools.   You faint.   You vomit up blood.   You vomit repeatedly.   You have abdominal pain.  You have a fever or persistent symptoms for more than 2-3 days.   You have a fever and your symptoms suddenly get worse.   You are dehydrated.  MAKE SURE YOU:  Understand these instructions.  Will watch your condition.  Will get help right away if you are not doing well or get worse.   This information is not intended to replace advice given to you by your health care provider. Make sure you discuss any questions you have with your health care provider.   Document Released: 11/24/2004 Document Revised: 06/19/2013 Document Reviewed: 04/12/2013 Elsevier Interactive Patient Education [employer]

## 2016-05-09 ENCOUNTER — Encounter (HOSPITAL_COMMUNITY): Payer: Self-pay | Admitting: Emergency Medicine

## 2016-05-09 ENCOUNTER — Emergency Department (HOSPITAL_COMMUNITY)
Admission: EM | Admit: 2016-05-09 | Discharge: 2016-05-10 | Disposition: A | Discharge status: Home / Self Care | Payer: Medicare Other | Attending: Emergency Medicine | Admitting: Emergency Medicine

## 2016-05-09 ENCOUNTER — Emergency Department (HOSPITAL_COMMUNITY): Payer: Medicare Other

## 2016-05-09 DIAGNOSIS — F17210 Nicotine dependence, cigarettes, uncomplicated: Secondary | ICD-10-CM | POA: Insufficient documentation

## 2016-05-09 DIAGNOSIS — Y999 Unspecified external cause status: Secondary | ICD-10-CM | POA: Insufficient documentation

## 2016-05-09 DIAGNOSIS — Y92009 Unspecified place in unspecified non-institutional (private) residence as the place of occurrence of the external cause: Secondary | ICD-10-CM | POA: Insufficient documentation

## 2016-05-09 DIAGNOSIS — Y939 Activity, unspecified: Secondary | ICD-10-CM | POA: Insufficient documentation

## 2016-05-09 DIAGNOSIS — S29012A Strain of muscle and tendon of back wall of thorax, initial encounter: Secondary | ICD-10-CM | POA: Diagnosis not present

## 2016-05-09 DIAGNOSIS — J45909 Unspecified asthma, uncomplicated: Secondary | ICD-10-CM | POA: Insufficient documentation

## 2016-05-09 DIAGNOSIS — T07XXXA Unspecified multiple injuries, initial encounter: Secondary | ICD-10-CM

## 2016-05-09 DIAGNOSIS — S39012A Strain of muscle, fascia and tendon of lower back, initial encounter: Secondary | ICD-10-CM

## 2016-05-09 DIAGNOSIS — W07XXXA Fall from chair, initial encounter: Secondary | ICD-10-CM | POA: Diagnosis not present

## 2016-05-09 DIAGNOSIS — Z79899 Other long term (current) drug therapy: Secondary | ICD-10-CM | POA: Insufficient documentation

## 2016-05-09 DIAGNOSIS — S8992XA Unspecified injury of left lower leg, initial encounter: Secondary | ICD-10-CM | POA: Diagnosis present

## 2016-05-09 DIAGNOSIS — S8002XA Contusion of left knee, initial encounter: Secondary | ICD-10-CM | POA: Insufficient documentation

## 2016-05-09 DIAGNOSIS — E785 Hyperlipidemia, unspecified: Secondary | ICD-10-CM | POA: Insufficient documentation

## 2016-05-09 DIAGNOSIS — F329 Major depressive disorder, single episode, unspecified: Secondary | ICD-10-CM | POA: Diagnosis not present

## 2016-05-09 MED ORDER — DIAZEPAM 5 MG PO TABS
5.0000 mg | ORAL_TABLET | Freq: Once | ORAL | Status: AC
  Administered 2016-05-09: 5 mg via ORAL
  Filled 2016-05-09: qty 1

## 2016-05-09 MED ORDER — OXYCODONE-ACETAMINOPHEN 5-325 MG PO TABS
1.0000 | ORAL_TABLET | Freq: Once | ORAL | Status: AC
  Administered 2016-05-09: 1 via ORAL
  Filled 2016-05-09: qty 1

## 2016-05-09 MED ORDER — ONDANSETRON HCL 4 MG PO TABS
4.0000 mg | ORAL_TABLET | Freq: Once | ORAL | Status: AC
  Administered 2016-05-09: 4 mg via ORAL
  Filled 2016-05-09: qty 1

## 2016-05-09 NOTE — ED Notes (Signed)
Pt from [facility] group home in [place] where she tripped over a wheelchair and hit her L. Knee. Now complains of L. Knee pain, back pain, and R. Hip pain. Pt is blind.

## 2016-05-10 DIAGNOSIS — S8002XA Contusion of left knee, initial encounter: Secondary | ICD-10-CM | POA: Diagnosis not present

## 2016-05-10 MED ORDER — OXYCODONE-ACETAMINOPHEN 5-325 MG PO TABS
1.0000 | ORAL_TABLET | Freq: Once | ORAL | Status: AC
  Administered 2016-05-10: 1 via ORAL
  Filled 2016-05-10: qty 1

## 2016-05-10 NOTE — ED Provider Notes (Signed)
CSN: [id number]     Arrival date & time 05/09/16  2054 History   First MD Initiated Contact with Patient 05/09/16 2252     Chief Complaint  Patient presents with  . Fall     (Consider location/radiation/quality/duration/timing/severity/associated sxs/prior Treatment) HPI Comments: Patient is a 45 year old female who presents to the emergency department with a complaint of a fall.  The patient is a resident of the [facility] in [place]. The patient states that she is totally blind on. She unfortunately tripped over one of the other residents wheelchair tonight. She injured her left knee, she complains of back pain, and she complains of right hip pain. There was no loss of consciousness reported. The patient denies being on any anticoagulation medications. She's not had previous operations on the knee, back, or hip.  The history is provided by the patient.    Past Medical History  Diagnosis Date  . Fibromyalgia   . Chronic headaches     Seeing Pain Management  . Hyperlipidemia   . Psychosis   . Hypothyroid   . Narcolepsy   . Asthma   . Constipation   . Kidney failure     Currently on Dialysis  . Blind   . Epilepsy (HCC)   . History of DVT (deep vein thrombosis)   . History of CVA (cerebrovascular accident)   . Depression   . Bipolar affect, depressed (HCC)   . Anxiety    Past Surgical History  Procedure Laterality Date  . Brain surgery  1976  . Cholecystectomy  2001  . Breast lumpectomy  2001  . Pituitary excision  1976  . Dg av dialysis graft declot or      X 4  . Peripheral vascular catheterization N/A 08/21/2015    Procedure: Dialysis/Perma Catheter Insertion;  Surgeon: Renford Dills, MD;  Location: [facility] INVASIVE CV LAB;  Service: Cardiovascular;  Laterality: N/A;  . Peripheral vascular catheterization N/A 02/29/2016    Procedure: Dialysis/Perma Catheter Removal;  Surgeon: Annice Needy, MD;  Location: [facility] INVASIVE CV LAB;  Service:  Cardiovascular;  Laterality: N/A;  . Givens capsule study N/A 02/29/2016    Procedure: GIVENS CAPSULE STUDY;  Surgeon: Elnita Maxwell, MD;  Location: [facility] ENDOSCOPY;  Service: Endoscopy;  Laterality: N/A;   Family History  Problem Relation Age of Onset  . Diabetes Father   . Heart attack Mother   . Migraines Mother   . Aneurysm Mother    Social History  Substance Use Topics  . Smoking status: Current Every Day Smoker -- 0.50 packs/day for 16 years    Types: Cigarettes  . Smokeless tobacco: None  . Alcohol Use: No     Comment: occasionally   OB History    No data available     Review of Systems  Eyes:       Patient is blind  Musculoskeletal: Positive for arthralgias.  Neurological: Positive for headaches.  Psychiatric/Behavioral: The patient is nervous/anxious.       Allergies  Dhea; Demerol; Floxin; Nsaids; Nubain; Phenergan; Stadol; and Erythromycin  Home Medications   Prior to Admission medications   Medication Sig Start Date End Date Taking? Authorizing Provider  albuterol (PROVENTIL HFA;VENTOLIN HFA) 108 (90 BASE) MCG/ACT inhaler Inhale 2 puffs into the lungs every 6 (six) hours as needed for wheezing or shortness of breath.   Yes Historical Provider, MD  budesonide-formoterol (SYMBICORT) 80-4.5 MCG/ACT inhaler Inhale 2 puffs into the lungs 2 (two) times daily.  Yes Historical Provider, MD  estradiol (ESTRACE) 0.1 MG/GM vaginal cream Place 1 Applicatorful vaginally 2 (two) times a week. Mondays and Thursdays   Yes Historical Provider, MD  fenofibrate (TRICOR) 145 MG tablet Take 145 mg by mouth every other day.    Yes Historical Provider, MD  fludrocortisone (FLORINEF) 0.1 MG tablet Take 0.1 mg by mouth daily.   Yes Historical Provider, MD  furosemide (LASIX) 40 MG tablet Take 40 mg by mouth 2 (two) times daily.   Yes Historical Provider, MD  gabapentin (NEURONTIN) 100 MG capsule Take 400 mg by mouth at bedtime.   Yes Historical Provider, MD  guaifenesin  (ROBITUSSIN) 100 MG/5ML syrup Take 200 mg by mouth every 6 (six) hours as needed for cough.   Yes Historical Provider, MD  hydrocortisone (CORTEF) 10 MG tablet Take 5-10 mg by mouth 2 (two) times daily. Pt takes 10 mg in the morning and 5 mg at bedtime.   Yes Historical Provider, MD  HYDROmorphone (DILAUDID) 4 MG tablet Take 1.5 tablets (6 mg total) by mouth every 6 (six) hours as needed for severe pain. Patient taking differently: Take 6 mg by mouth 3 (three) times daily as needed for severe pain.  03/07/16  Yes [name], MD  levothyroxine (SYNTHROID, LEVOTHROID) 112 MCG tablet Take 112 mcg by mouth daily.    Yes Historical Provider, MD  loratadine (CLARITIN) 10 MG tablet Take 10 mg by mouth daily.   Yes Historical Provider, MD  lovastatin (MEVACOR) 40 MG tablet Take 20 mg by mouth every evening.    Yes Historical Provider, MD  Melatonin 5 MG TABS Take 2 tablets by mouth at bedtime.   Yes Historical Provider, MD  methylphenidate (RITALIN) 20 MG tablet Take 20-30 mg by mouth 2 (two) times daily. Pt takes 30 mg in the morning and 20 mg at lunch.   Yes Historical Provider, MD  ondansetron (ZOFRAN-ODT) 8 MG disintegrating tablet Take 8 mg by mouth every 8 (eight) hours as needed for nausea or vomiting.   Yes Historical Provider, MD  pantoprazole (PROTONIX) 40 MG tablet Take 1 tablet (40 mg total) by mouth daily. 02/27/16  Yes [name], MD  risperidone (RISPERDAL) 4 MG tablet Take 2-4 mg by mouth 2 (two) times daily. Pt takes 4 mg in the morning and 2 mg at bedtime.   Yes Historical Provider, MD  sertraline (ZOLOFT) 50 MG tablet Take 50 mg by mouth daily.   Yes Historical Provider, MD  sevelamer carbonate (RENVELA) 800 MG tablet Take 4,000 mg by mouth 2 (two) times daily.    Yes Historical Provider, MD  topiramate (TOPAMAX) 200 MG tablet Take 200 mg by mouth 2 (two) times daily. Pt takes with a 50 mg tablet.   Yes Historical Provider, MD  topiramate (TOPAMAX) 50 MG tablet Take 50 mg by mouth 2 (two)  times daily. Pt takes with a 200 mg tablet.   Yes Historical Provider, MD  Vitamin D, Ergocalciferol, (DRISDOL) 50000 UNITS CAPS capsule Take 50,000 Units by mouth every 30 (thirty) days. Takes on the 8th of every month   Yes Historical Provider, MD  docusate sodium (COLACE) 100 MG capsule Take 2 capsules (200 mg total) by mouth 2 (two) times daily. Patient not taking: Reported on 03/23/2016 03/07/16   [name], MD  lactulose (CHRONULAC) 10 GM/15ML solution Take 45 mLs (30 g total) by mouth 2 (two) times daily. Patient not taking: Reported on 03/23/2016 03/07/16   [name], MD  Oxycodone HCl 10 MG  TABS Take 1 tablet (10 mg total) by mouth every 8 (eight) hours as needed (for pain). Patient not taking: Reported on 03/23/2016 03/07/16   [name], MD  polyethylene glycol (MIRALAX / GLYCOLAX) packet Take 17 g by mouth daily as needed for mild constipation.     Historical Provider, MD  senna (SENOKOT) 8.6 MG TABS tablet Take 2 tablets (17.2 mg total) by mouth daily. Patient not taking: Reported on 05/09/2016 03/07/16   [name], MD   BP 161/85 mmHg  Pulse 71  Temp(Src) 98.5 F (36.9 C) (Oral)  Resp 20  Ht 4\' 10"  (1.473 m)  Wt 68.04 kg  BMI 31.36 kg/m2  SpO2 100%  LMP 11/21/2007 (Approximate) Physical Exam  Constitutional: She is oriented to person, place, and time. She appears well-developed and well-nourished.  Non-toxic appearance.  HENT:  Head: Normocephalic.  Right Ear: Tympanic membrane and external ear normal.  Left Ear: Tympanic membrane and external ear normal.  Eyes: Lids are normal.  Patient is blind  Neck: Normal range of motion. Neck supple. Carotid bruit is not present.  Cardiovascular: Normal rate, regular rhythm, normal heart sounds, intact distal pulses and normal pulses.   Pulmonary/Chest: Breath sounds normal. No respiratory distress.  No chest wall pain.  Abdominal: Soft. Bowel sounds are normal. There is no tenderness. There is no guarding.   Musculoskeletal: Normal range of motion.  There is pain to palpation and attempted movement of the right hip. There is no visible dislocation. The dorsalis pedis pulses 2+.  X-ray of the left shows a bruise to the anterior knee. There is pain with attempted movement. There is no obvious deformity appreciated. There is no posterior mass appreciated. There is no deformity of the left tibial area on. The dorsalis pedis pulse on the left is 2+.  Patient has a dialysis graft in the right upper arm. Patient has a good thrill at the graft site.  There is pain to palpation of the lower lumbar area. No palpable step off appreciated on. No hot areas appreciated.  Lymphadenopathy:       Head (right side): No submandibular adenopathy present.       Head (left side): No submandibular adenopathy present.    She has no cervical adenopathy.  Neurological: She is alert and oriented to person, place, and time. She has normal strength. No cranial nerve deficit or sensory deficit.  Skin: Skin is warm and dry.  Psychiatric: She has a normal mood and affect. Her speech is normal.  Nursing note and vitals reviewed.   ED Course  Pain partially improved with oral Percocet and Valium.   Procedures (including critical care time) Labs Review Labs Reviewed - No data to display  Imaging Review Dg Lumbar Spine Complete  05/10/2016  CLINICAL DATA:  Trip and fall over wheelchair. History of blindness and end-stage renal disease. EXAM: LUMBAR SPINE - COMPLETE 4+ VIEW COMPARISON:  None available for comparison at time of study interpretation. FINDINGS: Five non rib-bearing lumbar-type vertebral bodies are intact and aligned with maintenance of the lumbar lordosis. Intervertebral disc heights are normal. No destructive bony lesions. Osteopenia. Sacroiliac joints are symmetric. Included prevertebral and paraspinal soft tissue planes are non-suspicious. Surgical clips in the included right abdomen compatible with  cholecystectomy. Moderate amount of retained large bowel stool partially imaged. IMPRESSION: Negative. Electronically Signed   By: Awilda Metro M.D.   On: 05/10/2016 00:22   Dg Knee Complete 4 Views Left  05/10/2016  CLINICAL DATA:  Trip and fall  injury. Right hip, low back, and left knee pain. EXAM: LEFT KNEE - COMPLETE 4+ VIEW COMPARISON:  03/11/2015 FINDINGS: No evidence of fracture, dislocation, or joint effusion. No evidence of arthropathy or other focal bone abnormality. Soft tissues are unremarkable. IMPRESSION: Negative. Electronically Signed   By: Burman Nieves M.D.   On: 05/10/2016 00:16   Dg Hip Unilat With Pelvis 2-3 Views Right  05/10/2016  CLINICAL DATA:  Right hip pain after trip and fall injury. EXAM: DG HIP (WITH OR WITHOUT PELVIS) 2-3V RIGHT COMPARISON:  None. FINDINGS: There is no evidence of hip fracture or dislocation. There is no evidence of arthropathy or other focal bone abnormality. IMPRESSION: Negative. Electronically Signed   By: Burman Nieves M.D.   On: 05/10/2016 00:19   I have personally reviewed and evaluated these images and lab results as part of my medical decision-making.   EKG Interpretation None      MDM  Vital signs within normal limits. X-ray of the left knee is negative for fracture or dislocation. X-ray of the right hip is negative for fracture or dislocation. X-ray of the lumbar spine are read as negative for fracture or dislocation.  Suspect the patient has a contusion involving the right hip and the left knee. Suspect lumbar strain as well. Patient made aware of the findings of tonight's examination and imaging.    Final diagnoses:  None    *I have reviewed nursing notes, vital signs, and all appropriate lab and imaging results for this patient.[address], PA-C 05/10/16 9622  Zadie Rhine, MD 05/15/16 [phone]

## 2016-05-10 NOTE — ED Notes (Signed)
Spoke with pt's caregiver:Wanda and notified her of pt's discharge. Stated she was on her way and that it would take her about 20 mins to get here.

## 2016-05-10 NOTE — Discharge Instructions (Signed)
The x-rays of your hip, knee, and lower back are negative for fracture or dislocation on. You were treated tonight with an oral narcotic Lumbosacral Strain Lumbosacral strain is a strain of any of the parts that make up your lumbosacral vertebrae. Your lumbosacral vertebrae are the bones that make up the lower third of your backbone. Your lumbosacral vertebrae are held together by muscles and tough, fibrous tissue (ligaments).  CAUSES  A sudden blow to your back can cause lumbosacral strain. Also, anything that causes an excessive stretch of the muscles in the low back can cause this strain. This is typically seen when people exert themselves strenuously, fall, lift heavy objects, bend, or crouch repeatedly. RISK FACTORS  Physically demanding work.  Participation in pushing or pulling sports or sports that require a sudden twist of the back (tennis, golf, baseball).  Weight lifting.  Excessive lower back curvature.  Forward-tilted pelvis.  Weak back or abdominal muscles or both.  Tight hamstrings. SIGNS AND SYMPTOMS  Lumbosacral strain may cause pain in the area of your injury or pain that moves (radiates) down your leg.  DIAGNOSIS Your health care provider can often diagnose lumbosacral strain through a physical exam. In some cases, you may need tests such as X-ray exams.  TREATMENT  Treatment for your lower back injury depends on many factors that your clinician will have to evaluate. However, most treatment will include the use of anti-inflammatory medicines. HOME CARE INSTRUCTIONS   Avoid hard physical activities (tennis, racquetball, waterskiing) if you are not in proper physical condition for it. This may aggravate or create problems.  If you have a back problem, avoid sports requiring sudden body movements. Swimming and walking are generally safer activities.  Maintain good posture.  Maintain a healthy weight.  For acute conditions, you may put ice on the injured  area.  Put ice in a plastic bag.  Place a towel between your skin and the bag.  Leave the ice on for 20 minutes, 2-3 times a day.  When the low back starts healing, stretching and strengthening exercises may be recommended. SEEK MEDICAL CARE IF:  Your back pain is getting worse.  You experience severe back pain not relieved with medicines. SEEK IMMEDIATE MEDICAL CARE IF:   You have numbness, tingling, weakness, or problems with the use of your arms or legs.  There is a change in bowel or bladder control.  You have increasing pain in any area of the body, including your belly (abdomen).  You notice shortness of breath, dizziness, or feel faint.  You feel sick to your stomach (nauseous), are throwing up (vomiting), or become sweaty.  You notice discoloration of your toes or legs, or your feet get very cold. MAKE SURE YOU:   Understand these instructions.  Will watch your condition.  Will get help right away if you are not doing well or get worse.   This information is not intended to replace advice given to you by your health care provider. Make sure you discuss any questions you have with your health care provider.   Document Released: 07/27/2005 Document Revised: 11/07/2014 Document Reviewed: 06/05/2013 Elsevier Interactive Patient Education [employer]  medication, as well as a muscle relaxing medication. Please use caution getting around. You may resume your current medications on.

## 2016-05-19 ENCOUNTER — Emergency Department (HOSPITAL_COMMUNITY)
Admission: EM | Admit: 2016-05-19 | Discharge: 2016-05-19 | Disposition: A | Discharge status: Home / Self Care | Payer: Medicare Other | Attending: Emergency Medicine | Admitting: Emergency Medicine

## 2016-05-19 ENCOUNTER — Encounter (HOSPITAL_COMMUNITY): Payer: Self-pay | Admitting: Emergency Medicine

## 2016-05-19 ENCOUNTER — Emergency Department (HOSPITAL_COMMUNITY): Payer: Medicare Other

## 2016-05-19 DIAGNOSIS — S2231XA Fracture of one rib, right side, initial encounter for closed fracture: Secondary | ICD-10-CM | POA: Diagnosis not present

## 2016-05-19 DIAGNOSIS — E785 Hyperlipidemia, unspecified: Secondary | ICD-10-CM | POA: Insufficient documentation

## 2016-05-19 DIAGNOSIS — F329 Major depressive disorder, single episode, unspecified: Secondary | ICD-10-CM | POA: Insufficient documentation

## 2016-05-19 DIAGNOSIS — Z79899 Other long term (current) drug therapy: Secondary | ICD-10-CM | POA: Insufficient documentation

## 2016-05-19 DIAGNOSIS — J45909 Unspecified asthma, uncomplicated: Secondary | ICD-10-CM | POA: Insufficient documentation

## 2016-05-19 DIAGNOSIS — Y939 Activity, unspecified: Secondary | ICD-10-CM | POA: Insufficient documentation

## 2016-05-19 DIAGNOSIS — S299XXA Unspecified injury of thorax, initial encounter: Secondary | ICD-10-CM | POA: Diagnosis present

## 2016-05-19 DIAGNOSIS — W010XXA Fall on same level from slipping, tripping and stumbling without subsequent striking against object, initial encounter: Secondary | ICD-10-CM | POA: Diagnosis not present

## 2016-05-19 DIAGNOSIS — Y999 Unspecified external cause status: Secondary | ICD-10-CM | POA: Insufficient documentation

## 2016-05-19 DIAGNOSIS — Y929 Unspecified place or not applicable: Secondary | ICD-10-CM | POA: Insufficient documentation

## 2016-05-19 DIAGNOSIS — F17210 Nicotine dependence, cigarettes, uncomplicated: Secondary | ICD-10-CM | POA: Diagnosis not present

## 2016-05-19 MED ORDER — HYDROCODONE-ACETAMINOPHEN 5-325 MG PO TABS
1.0000 | ORAL_TABLET | Freq: Once | ORAL | Status: AC
  Administered 2016-05-19: 1 via ORAL
  Filled 2016-05-19: qty 1

## 2016-05-19 NOTE — ED Provider Notes (Signed)
CSN: [id number]     Arrival date & time 05/19/16  4098 History   First MD Initiated Contact with Patient 05/19/16 [phone]     Chief Complaint  Patient presents with  . Fall     (Consider location/radiation/quality/duration/timing/severity/associated sxs/prior Treatment) HPI   Erin [name] is a 45 y.o. female who presents for evaluation of injuries from fall. She fell during the night, when she tripped over shoes. She states that she tripped because she has sightless. She injured her lower back, and right hip in the fall. He also injured her right hip and fall about a week ago when she was evaluated here. She takes pain medicine chronically. She denies nausea, vomiting, fever, chills, cough or shortness of breath. Currently the pain is worse when she moves. She was due for dialysis, yesterday, but did not get it because of a clot in her fistula, which has since been removed. There are no other known modifying factors.   Past Medical History  Diagnosis Date  . Fibromyalgia   . Chronic headaches     Seeing Pain Management  . Hyperlipidemia   . Psychosis   . Hypothyroid   . Narcolepsy   . Asthma   . Constipation   . Kidney failure     Currently on Dialysis  . Blind   . Epilepsy (HCC)   . History of DVT (deep vein thrombosis)   . History of CVA (cerebrovascular accident)   . Depression   . Bipolar affect, depressed (HCC)   . Anxiety    Past Surgical History  Procedure Laterality Date  . Brain surgery  1976  . Cholecystectomy  2001  . Breast lumpectomy  2001  . Pituitary excision  1976  . Dg av dialysis graft declot or      X 4  . Peripheral vascular catheterization N/A 08/21/2015    Procedure: Dialysis/Perma Catheter Insertion;  Surgeon: Renford Dills, MD;  Location: [facility] INVASIVE CV LAB;  Service: Cardiovascular;  Laterality: N/A;  . Peripheral vascular catheterization N/A 02/29/2016    Procedure: Dialysis/Perma Catheter Removal;  Surgeon: Annice Needy, MD;  Location:  [facility] INVASIVE CV LAB;  Service: Cardiovascular;  Laterality: N/A;  . Givens capsule study N/A 02/29/2016    Procedure: GIVENS CAPSULE STUDY;  Surgeon: Elnita Maxwell, MD;  Location: [facility] ENDOSCOPY;  Service: Endoscopy;  Laterality: N/A;   Family History  Problem Relation Age of Onset  . Diabetes Father   . Heart attack Mother   . Migraines Mother   . Aneurysm Mother    Social History  Substance Use Topics  . Smoking status: Current Every Day Smoker -- 0.50 packs/day for 16 years    Types: Cigarettes  . Smokeless tobacco: None  . Alcohol Use: No     Comment: occasionally   OB History    No data available     Review of Systems  All other systems reviewed and are negative.     Allergies  Dhea; Demerol; Floxin; Nsaids; Nubain; Phenergan; Stadol; and Erythromycin  Home Medications   Prior to Admission medications   Medication Sig Start Date End Date Taking? Authorizing Provider  albuterol (PROVENTIL HFA;VENTOLIN HFA) 108 (90 BASE) MCG/ACT inhaler Inhale 2 puffs into the lungs every 6 (six) hours as needed for wheezing or shortness of breath.    Historical Provider, MD  budesonide-formoterol (SYMBICORT) 80-4.5 MCG/ACT inhaler Inhale 2 puffs into the lungs 2 (two) times daily.    Historical Provider, MD  docusate sodium (COLACE)  100 MG capsule Take 2 capsules (200 mg total) by mouth 2 (two) times daily. Patient not taking: Reported on 03/23/2016 03/07/16   [name], MD  estradiol (ESTRACE) 0.1 MG/GM vaginal cream Place 1 Applicatorful vaginally 2 (two) times a week. Mondays and Thursdays    Historical Provider, MD  fenofibrate (TRICOR) 145 MG tablet Take 145 mg by mouth every other day.     Historical Provider, MD  fludrocortisone (FLORINEF) 0.1 MG tablet Take 0.1 mg by mouth daily.    Historical Provider, MD  furosemide (LASIX) 40 MG tablet Take 40 mg by mouth 2 (two) times daily.    Historical Provider, MD  gabapentin (NEURONTIN) 100 MG capsule Take 400 mg by mouth at  bedtime.    Historical Provider, MD  guaifenesin (ROBITUSSIN) 100 MG/5ML syrup Take 200 mg by mouth every 6 (six) hours as needed for cough.    Historical Provider, MD  hydrocortisone (CORTEF) 10 MG tablet Take 5-10 mg by mouth 2 (two) times daily. Pt takes 10 mg in the morning and 5 mg at bedtime.    Historical Provider, MD  HYDROmorphone (DILAUDID) 4 MG tablet Take 1.5 tablets (6 mg total) by mouth every 6 (six) hours as needed for severe pain. Patient taking differently: Take 6 mg by mouth 3 (three) times daily as needed for severe pain.  03/07/16   [name], MD  lactulose (CHRONULAC) 10 GM/15ML solution Take 45 mLs (30 g total) by mouth 2 (two) times daily. Patient not taking: Reported on 03/23/2016 03/07/16   [name], MD  levothyroxine (SYNTHROID, LEVOTHROID) 112 MCG tablet Take 112 mcg by mouth daily.     Historical Provider, MD  loratadine (CLARITIN) 10 MG tablet Take 10 mg by mouth daily.    Historical Provider, MD  lovastatin (MEVACOR) 40 MG tablet Take 20 mg by mouth every evening.     Historical Provider, MD  Melatonin 5 MG TABS Take 2 tablets by mouth at bedtime.    Historical Provider, MD  methylphenidate (RITALIN) 20 MG tablet Take 20-30 mg by mouth 2 (two) times daily. Pt takes 30 mg in the morning and 20 mg at lunch.    Historical Provider, MD  ondansetron (ZOFRAN-ODT) 8 MG disintegrating tablet Take 8 mg by mouth every 8 (eight) hours as needed for nausea or vomiting.    Historical Provider, MD  Oxycodone HCl 10 MG TABS Take 1 tablet (10 mg total) by mouth every 8 (eight) hours as needed (for pain). Patient not taking: Reported on 03/23/2016 03/07/16   [name], MD  pantoprazole (PROTONIX) 40 MG tablet Take 1 tablet (40 mg total) by mouth daily. 02/27/16   [name], MD  polyethylene glycol (MIRALAX / GLYCOLAX) packet Take 17 g by mouth daily as needed for mild constipation.     Historical Provider, MD  risperidone (RISPERDAL) 4 MG tablet Take 2-4 mg by mouth 2 (two)  times daily. Pt takes 4 mg in the morning and 2 mg at bedtime.    Historical Provider, MD  senna (SENOKOT) 8.6 MG TABS tablet Take 2 tablets (17.2 mg total) by mouth daily. Patient not taking: Reported on 05/09/2016 03/07/16   [name], MD  sertraline (ZOLOFT) 50 MG tablet Take 50 mg by mouth daily.    Historical Provider, MD  sevelamer carbonate (RENVELA) 800 MG tablet Take 4,000 mg by mouth 2 (two) times daily.     Historical Provider, MD  topiramate (TOPAMAX) 200 MG tablet Take 200 mg by mouth 2 (two)  times daily. Pt takes with a 50 mg tablet.    Historical Provider, MD  topiramate (TOPAMAX) 50 MG tablet Take 50 mg by mouth 2 (two) times daily. Pt takes with a 200 mg tablet.    Historical Provider, MD  Vitamin D, Ergocalciferol, (DRISDOL) 50000 UNITS CAPS capsule Take 50,000 Units by mouth every 30 (thirty) days. Takes on the 8th of every month    Historical Provider, MD   BP 173/90 mmHg  Pulse 60  Temp(Src) 97.9 F (36.6 C) (Oral)  Resp 20  Ht 4\' 10"  (1.473 m)  Wt 150 lb (68.04 kg)  BMI 31.36 kg/m2  SpO2 99%  LMP 11/21/2007 (Approximate) Physical Exam  Constitutional: She is oriented to person, place, and time. She appears well-developed.  Chronically ill-appearing, older appearance than stated age.  HENT:  Head: Normocephalic and atraumatic.  Right Ear: External ear normal.  Left Ear: External ear normal.  Eyes: Conjunctivae and EOM are normal. Pupils are equal, round, and reactive to light.  Neck: Normal range of motion and phonation normal. Neck supple.  Cardiovascular: Normal rate, regular rhythm and normal heart sounds.   Vascular fistula, right arm, with normal thrill.  Pulmonary/Chest: Effort normal and breath sounds normal. No respiratory distress. She has no wheezes. She exhibits tenderness (Bilateral, diffuse without crepitation or deformity). She exhibits no bony tenderness.  Abdominal: Soft. There is no tenderness.  Musculoskeletal: Normal range of motion.  Mild  right hip tenderness and pain with motion, but no limitations.  Neurological: She is alert and oriented to person, place, and time. No cranial nerve deficit or sensory deficit. She exhibits normal muscle tone. Coordination normal.  Skin: Skin is warm, dry and intact.  Psychiatric: She has a normal mood and affect. Her behavior is normal. Judgment and thought content normal.  Nursing note and vitals reviewed.   ED Course  Procedures (including critical care time)  Medications  HYDROcodone-acetaminophen (NORCO/VICODIN) 5-325 MG per tablet 1 tablet (1 tablet Oral Given 05/19/16 0724)    Patient Vitals for the past 24 hrs:  BP Temp Temp src Pulse Resp SpO2 Height Weight  05/19/16 0652 173/90 mmHg 97.9 F (36.6 C) Oral 60 20 99 % 4\' 10"  (1.473 m) 150 lb (68.04 kg)    8:50 AM Reevaluation with update and discussion. After initial assessment and treatment, an updated evaluation reveals No additional complaints. Findings discussed with patient, all questions were answered. Barron Vanloan L    Labs Review Labs Reviewed - No data to display  Imaging Review Dg Ribs Unilateral W/chest Right  05/19/2016  CLINICAL DATA:  Tripped last night in the home landing on the right side. Right-sided chest pain. EXAM: RIGHT RIBS AND CHEST - 3+ VIEW COMPARISON:  03/06/2016 FINDINGS: Chronic cardiomegaly. Mediastinal shadows are normal. Question indistinct 1 cm pulmonary nodule in the right suprahilar region. I cannot confirm this as a chronic finding. This should either be followed closely with radiography were evaluated with chest CT to see if this represents a pulmonary mass. No pneumothorax or hemothorax. Rib films show a new fracture at the eighth rib anteriorly. Previously seen right tenth rib fracture is not well evaluated as the lower ribs are poorly penetrated. IMPRESSION: Newly seen right eighth rib fracture antral laterally. Previously seen lower right rib fractures are not re- demonstrated because of  underpenetration. No pneumothorax or hemothorax. Question 1 cm right suprahilar density. This should either be followed with radiography or evaluated with chest CT. Electronically Signed   By: Scherrie Bateman.D.  On: 05/19/2016 07:56   Dg Pelvis 1-2 Views  05/19/2016  CLINICAL DATA:  Right pelvic pain after fall last night EXAM: PELVIS - 1-2 VIEW COMPARISON:  07/04/2013 FINDINGS: Single frontal view of the pelvis submitted. No acute fracture or subluxation. Moderate stool noted in rectosigmoid colon the right colon. Vascular stent is noted in left femoral artery. IMPRESSION: Negative. Electronically Signed   By: Natasha Mead M.D.   On: 05/19/2016 07:56   I have personally reviewed and evaluated these images and lab results as part of my medical decision-making.   EKG Interpretation None      MDM   Final diagnoses:  Rib fracture, right, closed, initial encounter    Mechanical fall with right rib fracture, acute and contusion, right hip. No serious injuries encountered. Vital signs, reassuring. Patient has recurrent falls. She is due for dialysis tomorrow. She has adequate pain relief at home.  Nursing Notes Reviewed/ Care Coordinated Applicable Imaging Reviewed Interpretation of Laboratory Data incorporated into ED treatment  The patient appears reasonably screened and/or stabilized for discharge and I doubt any other medical condition or other [facility] requiring further screening, evaluation, or treatment in the ED at this time prior to discharge.  Plan: Home Medications- usual; Home Treatments- rest; return here if the recommended treatment, does not improve the symptoms; Recommended follow up- PCP 1 week and prn    Mancel Bale, MD 05/19/16 [phone]

## 2016-05-19 NOTE — ED Notes (Signed)
Pt has called transportation

## 2016-05-19 NOTE — Discharge Instructions (Signed)

## 2016-05-19 NOTE — ED Notes (Signed)
Patient is legally blind and was getting up to use bathroom, she trip over her shoes and fell. Has c/o of right hip and back pain. Denies striking head or LOC.

## 2016-05-27 ENCOUNTER — Emergency Department (HOSPITAL_COMMUNITY)
Admission: EM | Admit: 2016-05-27 | Discharge: 2016-05-27 | Disposition: A | Discharge status: Home / Self Care | Payer: Medicare Other | Attending: Emergency Medicine | Admitting: Emergency Medicine

## 2016-05-27 ENCOUNTER — Encounter (HOSPITAL_COMMUNITY): Payer: Self-pay | Admitting: *Deleted

## 2016-05-27 DIAGNOSIS — Z79899 Other long term (current) drug therapy: Secondary | ICD-10-CM | POA: Diagnosis not present

## 2016-05-27 DIAGNOSIS — S0083XA Contusion of other part of head, initial encounter: Secondary | ICD-10-CM | POA: Diagnosis not present

## 2016-05-27 DIAGNOSIS — F17200 Nicotine dependence, unspecified, uncomplicated: Secondary | ICD-10-CM | POA: Diagnosis not present

## 2016-05-27 DIAGNOSIS — Y929 Unspecified place or not applicable: Secondary | ICD-10-CM | POA: Insufficient documentation

## 2016-05-27 DIAGNOSIS — Y9389 Activity, other specified: Secondary | ICD-10-CM | POA: Insufficient documentation

## 2016-05-27 DIAGNOSIS — J45909 Unspecified asthma, uncomplicated: Secondary | ICD-10-CM | POA: Insufficient documentation

## 2016-05-27 DIAGNOSIS — Y999 Unspecified external cause status: Secondary | ICD-10-CM | POA: Diagnosis not present

## 2016-05-27 DIAGNOSIS — R51 Headache: Secondary | ICD-10-CM | POA: Diagnosis present

## 2016-05-27 LAB — CBG MONITORING, ED
GLUCOSE-CAPILLARY: 70 mg/dL (ref 65–99)
GLUCOSE-CAPILLARY: 83 mg/dL (ref 65–99)
GLUCOSE-CAPILLARY: 69 mg/dL (ref 65–99)

## 2016-05-27 MED ORDER — HYDROMORPHONE HCL 2 MG/ML IJ SOLN
1.5000 mg | Freq: Once | INTRAMUSCULAR | Status: AC
  Administered 2016-05-27: 1.5 mg via INTRAMUSCULAR
  Filled 2016-05-27: qty 1
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
## 2016-05-27 NOTE — ED Notes (Signed)
Spoke to [place] at adult care home, she will be here soon to pick up pt.

## 2016-05-27 NOTE — ED Notes (Signed)
Gave Pt another drink/coke & also gave her a pack of crackers. Sugar was 83.
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
## 2016-05-27 NOTE — ED Notes (Signed)
Pt given crackers, peanut butter, and OJ 

## 2016-06-03 ENCOUNTER — Encounter (HOSPITAL_COMMUNITY): Payer: Self-pay

## 2016-06-03 ENCOUNTER — Emergency Department (HOSPITAL_COMMUNITY)
Admission: EM | Admit: 2016-06-03 | Discharge: 2016-06-04 | Disposition: A | Discharge status: Home / Self Care | Payer: Medicare Other | Attending: Emergency Medicine | Admitting: Emergency Medicine

## 2016-06-03 DIAGNOSIS — E039 Hypothyroidism, unspecified: Secondary | ICD-10-CM | POA: Insufficient documentation

## 2016-06-03 DIAGNOSIS — W19XXXS Unspecified fall, sequela: Secondary | ICD-10-CM | POA: Diagnosis not present

## 2016-06-03 DIAGNOSIS — Z79899 Other long term (current) drug therapy: Secondary | ICD-10-CM | POA: Insufficient documentation

## 2016-06-03 DIAGNOSIS — J45909 Unspecified asthma, uncomplicated: Secondary | ICD-10-CM | POA: Insufficient documentation

## 2016-06-03 DIAGNOSIS — F17210 Nicotine dependence, cigarettes, uncomplicated: Secondary | ICD-10-CM | POA: Insufficient documentation

## 2016-06-03 DIAGNOSIS — S2241XS Multiple fractures of ribs, right side, sequela: Secondary | ICD-10-CM | POA: Diagnosis not present

## 2016-06-03 DIAGNOSIS — S2243XS Multiple fractures of ribs, bilateral, sequela: Secondary | ICD-10-CM

## 2016-06-03 DIAGNOSIS — S299XXS Unspecified injury of thorax, sequela: Secondary | ICD-10-CM | POA: Diagnosis present

## 2016-06-03 NOTE — ED Triage Notes (Signed)
Pt was  Brought in by ems for c/o right rib pain from a fall a couple of weeks ago.  Pt was sent to triage.  Now pt calls out in triage waiting area stating she is having chest pain that is worse with breathing.

## 2016-06-04 ENCOUNTER — Emergency Department (HOSPITAL_COMMUNITY): Payer: Medicare Other

## 2016-06-04 DIAGNOSIS — S2241XS Multiple fractures of ribs, right side, sequela: Secondary | ICD-10-CM | POA: Diagnosis not present

## 2016-06-04 LAB — CBC WITH DIFFERENTIAL/PLATELET
BASOS ABS: 0 10*3/uL (ref 0.0–0.1)
BASOS PCT: 0 %
EOS PCT: 1 %
Eosinophils Absolute: 0.1 10*3/uL (ref 0.0–0.7)
HEMATOCRIT: 26.5 % — AB (ref 36.0–46.0)
Hemoglobin: 8.4 g/dL — ABNORMAL LOW (ref 12.0–15.0)
LYMPHS PCT: 14 %
Lymphs Abs: 1.5 10*3/uL (ref 0.7–4.0)
MCH: 30.2 pg (ref 26.0–34.0)
MCHC: 31.7 g/dL (ref 30.0–36.0)
MCV: 95.3 fL (ref 78.0–100.0)
MONO ABS: 0.6 10*3/uL (ref 0.1–1.0)
MONOS PCT: 6 %
Neutro Abs: 8.3 10*3/uL — ABNORMAL HIGH (ref 1.7–7.7)
Neutrophils Relative %: 78 %
PLATELETS: 128 10*3/uL — AB (ref 150–400)
RBC: 2.78 MIL/uL — ABNORMAL LOW (ref 3.87–5.11)
RDW: 20.6 % — AB (ref 11.5–15.5)
WBC: 10.5 10*3/uL (ref 4.0–10.5)

## 2016-06-04 LAB — TROPONIN I: Troponin I: 0.03 ng/mL (ref <0.03)

## 2016-06-04 LAB — BASIC METABOLIC PANEL
ANION GAP: 8 (ref 5–15)
BUN: 58 mg/dL — AB (ref 6–20)
CALCIUM: 7.6 mg/dL — AB (ref 8.9–10.3)
CO2: 24 mmol/L (ref 22–32)
CREATININE: 5.06 mg/dL — AB (ref 0.44–1.00)
Chloride: 106 mmol/L (ref 101–111)
GFR calc Af Amer: 11 mL/min — ABNORMAL LOW (ref >60)
GFR, EST NON AFRICAN AMERICAN: 10 mL/min — AB (ref >60)
Glucose, Bld: 79 mg/dL (ref 65–99)
Potassium: 4.7 mmol/L (ref 3.5–5.1)
Sodium: 138 mmol/L (ref 135–145)

## 2016-06-04 LAB — D-DIMER, QUANTITATIVE: D-Dimer, Quant: 2.05 ug/mL-FEU — ABNORMAL HIGH (ref 0.00–0.50)

## 2016-06-04 MED ORDER — IOPAMIDOL (ISOVUE-370) INJECTION 76%
100.0000 mL | Freq: Once | INTRAVENOUS | Status: AC | PRN
  Administered 2016-06-04: 100 mL via INTRAVENOUS

## 2016-06-04 MED ORDER — ACETAMINOPHEN 500 MG PO TABS
1000.0000 mg | ORAL_TABLET | Freq: Once | ORAL | Status: DC
  Filled 2016-06-04: qty 2

## 2016-06-04 NOTE — ED Notes (Signed)
Burna Mortimer Philips called [phone] to pick patient up, states she is on her way

## 2016-06-04 NOTE — Progress Notes (Signed)
Incentive in room patient would not do , would awaken.

## 2016-06-04 NOTE — ED Provider Notes (Signed)
By signing my name below, I, [name], attest that this documentation has been prepared under the direction and in the presence of Erin Abair N Kentley Cedillo, DO. Electronically Signed: [name]. [name], ED Scribe. 06/04/16. 12:53 AM.  TIME SEEN: 12:23 AM  CHIEF COMPLAINT:  Chief Complaint  Patient presents with  . Chest Pain    HPI:  HPI Comments: Erin [name] is a 45 y.o. female brought in by ambulance, with PMHx of Epilepsy, HLD, Renal FailureOn hemodialysis, DVT No longer on anticoagulation, and CVA who presents to the Emergency Department complaining of sudden onset, constant, sharp right-sided rib pain x 6 hrs PTA. Pt notes associated left-sided chest pain which began around 1900 on 06/03/16. She reports that difficulty breathing has resolved. She notes that she broke rib #8 s/p fall that occurred on 05/19/16, but current pain is not in the same location. Denies any head injury or LOC after fall. Pain is exacerbated with movement, exertion, and breathing. Pt has dialysis treatments on MWF for over 4 years. Denies any recent injury or fall. Pt currently takes ASA. Denies diaphoresis, nausea, vomiting, lower extreme swelling or pain, fever or cough. Has not missed any recent dialysis. Currently in pain management.  ROS: See HPI Constitutional: no fever  Eyes: no drainage  ENT: no runny nose   Cardiovascular: chest pain  Resp: SOB  GI: no vomiting GU: no dysuria Integumentary: no rash  Allergy: no hives  Musculoskeletal: no leg swelling  Neurological: no slurred speech ROS otherwise negative  PAST MEDICAL HISTORY/PAST SURGICAL HISTORY:  Past Medical History:  Diagnosis Date  . Anxiety   . Asthma   . Bipolar affect, depressed (HCC)   . Blind   . Chronic headaches    Seeing Pain Management  . Constipation   . Depression   . Epilepsy (HCC)   . Fibromyalgia   . History of CVA (cerebrovascular accident)   . History of DVT (deep vein thrombosis)   . Hyperlipidemia   .  Hypothyroid   . Kidney failure    Currently on Dialysis  . Narcolepsy   . Psychosis     MEDICATIONS:  Prior to Admission medications   Medication Sig Start Date End Date Taking? Authorizing Provider  albuterol (PROVENTIL HFA;VENTOLIN HFA) 108 (90 BASE) MCG/ACT inhaler Inhale 2 puffs into the lungs every 6 (six) hours as needed for wheezing or shortness of breath.    Historical Provider, MD  budesonide-formoterol (SYMBICORT) 80-4.5 MCG/ACT inhaler Inhale 2 puffs into the lungs 2 (two) times daily.    Historical Provider, MD  estradiol (ESTRACE) 0.1 MG/GM vaginal cream Place 1 Applicatorful vaginally 2 (two) times a week. Mondays and Thursdays    Historical Provider, MD  fenofibrate (TRICOR) 145 MG tablet Take 145 mg by mouth every other day.     Historical Provider, MD  fludrocortisone (FLORINEF) 0.1 MG tablet Take 0.1 mg by mouth daily.    Historical Provider, MD  furosemide (LASIX) 40 MG tablet Take 40 mg by mouth 2 (two) times daily.    Historical Provider, MD  gabapentin (NEURONTIN) 100 MG capsule Take 400 mg by mouth at bedtime.    Historical Provider, MD  guaifenesin (ROBITUSSIN) 100 MG/5ML syrup Take 200 mg by mouth every 6 (six) hours as needed for cough.    Historical Provider, MD  hydrocortisone (CORTEF) 10 MG tablet Take 5-10 mg by mouth 2 (two) times daily. Pt takes 10 mg in the morning and 5 mg at bedtime.    Historical Provider,  MD  HYDROmorphone (DILAUDID) 4 MG tablet Take 1.5 tablets (6 mg total) by mouth every 6 (six) hours as needed for severe pain. Patient taking differently: Take 6 mg by mouth 3 (three) times daily as needed for severe pain.  03/07/16   [name], MD  levothyroxine (SYNTHROID, LEVOTHROID) 112 MCG tablet Take 112 mcg by mouth daily.     Historical Provider, MD  loratadine (CLARITIN) 10 MG tablet Take 10 mg by mouth daily.    Historical Provider, MD  lovastatin (MEVACOR) 40 MG tablet Take 20 mg by mouth every evening.     Historical Provider, MD  Melatonin  5 MG TABS Take 2 tablets by mouth at bedtime.    Historical Provider, MD  methylphenidate (RITALIN) 20 MG tablet Take 20-30 mg by mouth 2 (two) times daily. Pt takes 30 mg in the morning and 20 mg at lunch.    Historical Provider, MD  ondansetron (ZOFRAN-ODT) 8 MG disintegrating tablet Take 8 mg by mouth every 8 (eight) hours as needed for nausea or vomiting.    Historical Provider, MD  pantoprazole (PROTONIX) 40 MG tablet Take 1 tablet (40 mg total) by mouth daily. 02/27/16   [name], MD  polyethylene glycol (MIRALAX / GLYCOLAX) packet Take 17 g by mouth daily as needed for mild constipation.     Historical Provider, MD  risperidone (RISPERDAL) 4 MG tablet Take 2-4 mg by mouth 2 (two) times daily. Pt takes 4 mg in the morning and 2 mg at bedtime.    Historical Provider, MD  sertraline (ZOLOFT) 50 MG tablet Take 50 mg by mouth daily.    Historical Provider, MD  sevelamer carbonate (RENVELA) 800 MG tablet Take 4,000 mg by mouth 2 (two) times daily.     Historical Provider, MD  topiramate (TOPAMAX) 200 MG tablet Take 200 mg by mouth 2 (two) times daily. Pt takes with a 50 mg tablet.    Historical Provider, MD  topiramate (TOPAMAX) 50 MG tablet Take 50 mg by mouth 2 (two) times daily. Pt takes with a 200 mg tablet.    Historical Provider, MD  Vitamin D, Ergocalciferol, (DRISDOL) 50000 UNITS CAPS capsule Take 50,000 Units by mouth every 30 (thirty) days. Takes on the 8th of every month    Historical Provider, MD    ALLERGIES:  Allergies  Allergen Reactions  . Dhea [Nutritional Supplements] Anaphylaxis    Patient states medication is DHE for migraines, not DHEA  . Demerol [Meperidine] Other (See Comments)    Reaction:  Hallucinations   . Floxin [Ofloxacin] Other (See Comments)    Reaction:  Hallucinations   . Nsaids Nausea And Vomiting and Swelling  . Nubain [Nalbuphine Hcl] Other (See Comments)    Reaction:  Hallucinations   . Phenergan [Promethazine Hcl] Other (See Comments)    Reaction:   Restless legs   . Stadol [Butorphanol] Other (See Comments)    Reaction:  Hallucinations   . Erythromycin Diarrhea and Rash    SOCIAL HISTORY:  Social History  Substance Use Topics  . Smoking status: Current Every Day Smoker    Packs/day: 0.50    Years: 16.00    Types: Cigarettes  . Smokeless tobacco: Never Used  . Alcohol use No     Comment: occasionally    FAMILY HISTORY: Family History  Problem Relation Age of Onset  . Heart attack Mother   . Migraines Mother   . Aneurysm Mother   . Diabetes Father     EXAM: BP 180/87 (  BP Location: Left Arm)   Pulse 66   Temp 98.7 F (37.1 C) (Oral)   Resp 16   Ht 4\' 10"  (1.473 m)   Wt 145 lb (65.8 kg)   LMP 11/21/2007 (Approximate) Comment: No period since 2009,due to"DVT" per pt since they stopped BCP...no periods  SpO2 100%   BMI 30.31 kg/m  CONSTITUTIONAL: Alert and oriented and responds appropriately to questions. Chronically ill-appeaaing, appears very drowsy, does not open her eyes; well-nourished; GCS 15 HEAD: Normocephalic; atraumatic EYES: Conjunctivae clear, PERRL, EOMI ENT: normal nose; no rhinorrhea; moist mucous membranes; pharynx without lesions noted; no dental injury; no septal hematoma NECK: Supple, no meningismus, no LAD; no midline spinal tenderness, step-off or deformity CARD: RRR; S1 and S2 appreciated; no murmurs, no clicks, no rubs, no gallops RESP: Normal chest excursion without splinting or tachypnea; breath sounds clear and equal bilaterally; no wheezes, no rhonchi, no rales; no hypoxia or respiratory distress CHEST:  chest wall stable, no crepitus or ecchymosis or deformity, Tender to palpation over the right and left chest wall with ecchymosis over the right lateral chest wall ABD/GI: Normal bowel sounds; non-distended; soft, non-tender, no rebound, no guarding PELVIS:  stable, nontender to palpation BACK:  The back appears normal and is non-tender to palpation, there is no CVA tenderness; no midline  spinal tenderness, step-off or deformity EXT: Normal ROM in all joints; non-tender to palpation; no edema; normal capillary refill; no cyanosis, no bony tenderness or bony deformity of patient's extremities, no joint effusion, no ecchymosis or lacerations, diaylsis fistula in right upper arm with good thrill, no bleeding, no erythema or warmt, no tenderness, 2+ radial and DP pulses bilaterally.    SKIN: Normal color for age and race; warm NEURO: Moves all extremities equally, sensation to light touch intact diffusely, cranial nerves II through XII intact PSYCH: The patient's mood and manner are appropriate. Grooming and personal hygiene are appropriate.  MEDICAL DECISION MAKING: Patient here with atypical chest pain. Had a fall 2 weeks ago and is complaining of pain since then. There is also some left-sided pain but does not room or injury to this area. She does appear very drowsy on exam and is on multiple pain medications at home. I do not feel she needs further narcotics at this time. We'll obtain x-ray of her right ribs, labs. Given she has a history of previous DVT, will obtain d-dimer to rule out pulmonary embolus is the cause of her pain. She does have risk factors for ACS but this appears to be very atypical. Pain is more musculoskeletal in nature, reproducible with palpation. Pain is not or pleuritic. No fevers or cough.  ED PROGRESS: Patient's labs show troponin of 0.03 but this is in the setting of chronic kidney disease. She still hemodynamically stable. D-dimer is elevated. Will obtain a CT of her chest and canceled the chest x-ray is a has not yet been completed.   CT the chest shows subacute rib fractures of the right second, eighth, ninth ribs in the left fourth rib. No infiltrate or pneumothorax. No edema. No pulmonary embolus.  Patient's repeat troponin is negative. She is still hemodynamically stable. I feel she is safe to be discharged. She has narcotic pain medication at home. We'll  discharge with incentive spirometer.  No other sign of trauma on exam.   At this time, I do not feel there is any life-threatening condition present. I have reviewed and discussed all results (EKG, imaging, lab, urine as appropriate), exam findings with  patient/family. I have reviewed nursing notes and appropriate previous records.  I feel the patient is safe to be discharged home without further emergent workup and can continue workup as an outpatient. Discussed usual and customary return precautions. Patient/family verbalize understanding and are comfortable with this plan.  Outpatient follow-up has been provided. All questions have been answered.     EKG Interpretation  Date/Time:  Friday June 03 2016 23:29:29 EDT Ventricular Rate:  68 PR Interval:  142 QRS Duration: 98 QT Interval:  406 QTC Calculation: 431 R Axis:   88 Text Interpretation:  Normal sinus rhythm Cannot rule out Anterior infarct , age undetermined Abnormal ECG No significant change since last tracing Confirmed by Ollis Daudelin,  DO, Persais Ethridge [phone]) on 06/04/2016 4:23:40 AM       I personally performed the services described in this documentation, which was scribed in my presence. The recorded information has been reviewed and is accurate.    Layla Maw Sohaib Vereen, DO 06/04/16 [phone]

## 2016-06-04 NOTE — Discharge Instructions (Signed)
You were found to have rib fractures that were old to the right second, eighth, ninth ribs in the left fourth rib. Please use your incentive spirometer every 1-2 hours while awake for the next 2 weeks. Please follow up with your pain management doctor for further pain management.

## 2016-06-04 NOTE — ED Notes (Signed)
Patient was asleep when this nurse entered room. Woke patient to give pain medications. Patient refused ordered pain medication

## 2016-06-04 NOTE — ED Notes (Signed)
CRITICAL VALUE ALERT  Critical value received:  Troponin 0.03 Date of notification:  06/04/16  Time of notification:  0116  Critical value read back:Yes.    Nurse who received alert:  jrm  MD notified (1st page):  Ward  Time of first page:  0116  MD notified (2nd page):  Time of second page:  Responding MD:  Ward  Time MD responded:  [phone]
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
## 2016-06-19 ENCOUNTER — Emergency Department (HOSPITAL_COMMUNITY): Payer: Medicare Other

## 2016-06-19 ENCOUNTER — Encounter (HOSPITAL_COMMUNITY): Payer: Self-pay | Admitting: Emergency Medicine

## 2016-06-19 ENCOUNTER — Emergency Department (HOSPITAL_COMMUNITY)
Admission: EM | Admit: 2016-06-19 | Discharge: 2016-06-19 | Disposition: A | Discharge status: Home / Self Care | Payer: Medicare Other | Attending: Emergency Medicine | Admitting: Emergency Medicine

## 2016-06-19 DIAGNOSIS — Z86718 Personal history of other venous thrombosis and embolism: Secondary | ICD-10-CM | POA: Diagnosis not present

## 2016-06-19 DIAGNOSIS — F17210 Nicotine dependence, cigarettes, uncomplicated: Secondary | ICD-10-CM | POA: Insufficient documentation

## 2016-06-19 DIAGNOSIS — J45909 Unspecified asthma, uncomplicated: Secondary | ICD-10-CM | POA: Insufficient documentation

## 2016-06-19 DIAGNOSIS — E039 Hypothyroidism, unspecified: Secondary | ICD-10-CM | POA: Insufficient documentation

## 2016-06-19 DIAGNOSIS — R600 Localized edema: Secondary | ICD-10-CM | POA: Diagnosis not present

## 2016-06-19 DIAGNOSIS — N186 End stage renal disease: Secondary | ICD-10-CM | POA: Insufficient documentation

## 2016-06-19 DIAGNOSIS — Z79899 Other long term (current) drug therapy: Secondary | ICD-10-CM | POA: Diagnosis not present

## 2016-06-19 DIAGNOSIS — M79671 Pain in right foot: Secondary | ICD-10-CM | POA: Diagnosis not present

## 2016-06-19 DIAGNOSIS — Z992 Dependence on renal dialysis: Secondary | ICD-10-CM | POA: Insufficient documentation

## 2016-06-19 DIAGNOSIS — R2243 Localized swelling, mass and lump, lower limb, bilateral: Secondary | ICD-10-CM | POA: Diagnosis present

## 2016-06-19 DIAGNOSIS — R609 Edema, unspecified: Secondary | ICD-10-CM

## 2016-06-19 LAB — CBC
HEMATOCRIT: 30.7 % — AB (ref 36.0–46.0)
Hemoglobin: 9.6 g/dL — ABNORMAL LOW (ref 12.0–15.0)
MCH: 31.5 pg (ref 26.0–34.0)
MCHC: 31.3 g/dL (ref 30.0–36.0)
MCV: 100.7 fL — AB (ref 78.0–100.0)
PLATELETS: 177 10*3/uL (ref 150–400)
RBC: 3.05 MIL/uL — AB (ref 3.87–5.11)
RDW: 21.7 % — ABNORMAL HIGH (ref 11.5–15.5)
WBC: 12.1 10*3/uL — AB (ref 4.0–10.5)

## 2016-06-19 LAB — BASIC METABOLIC PANEL
Anion gap: 6 (ref 5–15)
BUN: 68 mg/dL — ABNORMAL HIGH (ref 6–20)
CHLORIDE: 107 mmol/L (ref 101–111)
CO2: 25 mmol/L (ref 22–32)
Calcium: 7.7 mg/dL — ABNORMAL LOW (ref 8.9–10.3)
Creatinine, Ser: 6.62 mg/dL — ABNORMAL HIGH (ref 0.44–1.00)
GFR, EST AFRICAN AMERICAN: 8 mL/min — AB (ref >60)
GFR, EST NON AFRICAN AMERICAN: 7 mL/min — AB (ref >60)
Glucose, Bld: 79 mg/dL (ref 65–99)
POTASSIUM: 4.9 mmol/L (ref 3.5–5.1)
SODIUM: 138 mmol/L (ref 135–145)

## 2016-06-19 LAB — CBG MONITORING, ED: GLUCOSE-CAPILLARY: 83 mg/dL (ref 65–99)

## 2016-06-19 MED ORDER — FUROSEMIDE 10 MG/ML IJ SOLN
40.0000 mg | INTRAMUSCULAR | Status: AC
  Administered 2016-06-19: 40 mg via INTRAVENOUS
  Filled 2016-06-19: qty 4

## 2016-06-19 NOTE — ED Triage Notes (Signed)
Per EMS: Pt has bilateral swelling in both legs x3 weeks, mild sob for 3 days, clear on auscultation with ems.  Pt had dialysis last Friday but only did 26 minutes of treatment. Denies cp.  180/100

## 2016-06-19 NOTE — Discharge Instructions (Signed)

## 2016-06-19 NOTE — ED Provider Notes (Signed)
[facility]-EMERGENCY DEPT Provider Note   CSN: [id number] Arrival date & time: 06/19/16  [id number]  By signing my name below, I, Erin [name], attest that this documentation has been prepared under the direction and in the presence of [name], MD . Electronically Signed: Nelwyn SalisburyJoshua [name], Scribe. 06/19/2016. 10:39 AM.   History   Chief Complaint Chief Complaint  Patient presents with  . Leg Swelling   The history is provided by the patient. No language interpreter was used.    HPI Comments:  Pincus LargeSarah B [name] is a 45 y.o. female with PMHx of CVA, DVT, and kidney failure who presents to the Emergency Department complaining of chronic bilateral swelling in her lower legs. She reports associated hip pain, and states this pain is also chronic. Pt has been on MWF dialysis schedule for the past 4 years due to kidney failure. She reports she attended dialysis last week but only sat for 26 minutes due to pain. Pt reports having a prescription for pain medication (dilaudid) but is not permitted to bring her pain medication to dialysis. She is currently compliant with her Lasix prescription for her edema and notes she is still urinating 3x daily. Pt denies any trouble walking.  Pt also complains of sudden onset mild right ankle pain beginning last week. She reports someone fell on her right ankle. Her pain is worsened on palpation. No alleviating factors indicated.   Past Medical History:  Diagnosis Date  . Anxiety   . Asthma   . Bipolar affect, depressed (HCC)   . Blind   . Chronic headaches    Seeing Pain Management  . Constipation   . Depression   . Epilepsy (HCC)   . Fibromyalgia   . History of CVA (cerebrovascular accident)   . History of DVT (deep vein thrombosis)   . Hyperlipidemia   . Hypothyroid   . Kidney failure    Currently on Dialysis  . Narcolepsy   . Psychosis     Patient Active Problem List   Diagnosis Date Noted  . GI bleed 03/04/2016  . Generalized weakness 03/04/2016    . Hypothyroidism 03/04/2016  . GERD (gastroesophageal reflux disease) 03/04/2016  . ESRD on dialysis (HCC) 03/04/2016  . Bipolar 1 disorder, mixed, moderate (HCC) 02/26/2016  . 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 Anemia 02/25/2016    Past Surgical History:  Procedure Laterality Date  . BRAIN SURGERY  1976  . BREAST LUMPECTOMY  2001  . CHOLECYSTECTOMY  2001  . DG AV DIALYSIS GRAFT DECLOT OR     X 4  . GIVENS CAPSULE STUDY N/A 02/29/2016   Procedure: GIVENS CAPSULE STUDY;  Surgeon: Elnita MaxwellMatthew Gordon Rein, MD;  [facility];  Service: Endoscopy;  Laterality: N/A;  . PERIPHERAL VASCULAR CATHETERIZATION N/A 08/21/2015   Procedure: Dialysis/Perma Catheter Insertion;  Surgeon: [name], MD;  Location: [facility] INVASIVE CV LAB;  Service: Cardiovascular;  Laterality: N/A;  . PERIPHERAL VASCULAR CATHETERIZATION N/A 02/29/2016   Procedure: Dialysis/Perma Catheter Removal;  Surgeon: [name], MD;  Location: [facility] INVASIVE CV LAB;  Service: Cardiovascular;  Laterality: N/A;  . PITUITARY EXCISION  1976    OB History    No data available       Home Medications    Prior to Admission medications   Medication Sig Start Date End Date Taking? Authorizing Provider  albuterol (PROVENTIL HFA;VENTOLIN HFA) 108 (90 BASE) MCG/ACT inhaler Inhale 2 puffs into the lungs every 6 (six) hours  as needed for wheezing or shortness of breath.   Yes Historical Provider, MD  budesonide-formoterol (SYMBICORT) 80-4.5 MCG/ACT inhaler Inhale 2 puffs into the lungs 2 (two) times daily.   Yes Historical Provider, MD  estradiol (ESTRACE) 0.1 MG/GM vaginal cream Place 1 Applicatorful vaginally 2 (two) times a week. Mondays and Thursdays   Yes Historical Provider, MD  fenofibrate (TRICOR) 145 MG tablet Take 145 mg by mouth every other day.    Yes Historical Provider, MD  fludrocortisone (FLORINEF) 0.1 MG tablet Take 0.1 mg by mouth daily.    Yes Historical Provider, MD  furosemide (LASIX) 40 MG tablet Take 40 mg by mouth 2 (two) times daily.   Yes Historical Provider, MD  gabapentin (NEURONTIN) 100 MG capsule Take 400 mg by mouth at bedtime.   Yes Historical Provider, MD  guaifenesin (ROBITUSSIN) 100 MG/5ML syrup Take 200 mg by mouth every 6 (six) hours as needed for cough.   Yes Historical Provider, MD  hydrocortisone (CORTEF) 10 MG tablet Take 5-10 mg by mouth 2 (two) times daily. Pt takes 10 mg in the morning and 5 mg at bedtime.   Yes Historical Provider, MD  HYDROmorphone (DILAUDID) 4 MG tablet Take 1.5 tablets (6 mg total) by mouth every 6 (six) hours as needed for severe pain. Patient taking differently: Take 6 mg by mouth 3 (three) times daily as needed for severe pain.  03/07/16  Yes [name], MD  levothyroxine (SYNTHROID, LEVOTHROID) 112 MCG tablet Take 112 mcg by mouth daily.    Yes Historical Provider, MD  loratadine (CLARITIN) 10 MG tablet Take 10 mg by mouth daily.   Yes Historical Provider, MD  lovastatin (MEVACOR) 40 MG tablet Take 20 mg by mouth every evening.    Yes Historical Provider, MD  Melatonin 5 MG TABS Take 2 tablets by mouth at bedtime.   Yes Historical Provider, MD  methylphenidate (RITALIN) 20 MG tablet Take 20-30 mg by mouth 2 (two) times daily. Pt takes 30 mg in the morning and 20 mg at lunch.   Yes Historical Provider, MD  ondansetron (ZOFRAN-ODT) 8 MG disintegrating tablet Take 8 mg by mouth every 8 (eight) hours as needed for nausea or vomiting.   Yes Historical Provider, MD  pantoprazole (PROTONIX) 40 MG tablet Take 1 tablet (40 mg total) by mouth daily. 02/27/16  Yes [name], MD  polyethylene glycol (MIRALAX / GLYCOLAX) packet Take 17 g by mouth daily as needed for mild constipation.    Yes Historical Provider, MD  risperidone (RISPERDAL) 4 MG tablet Take 2-4 mg by mouth 2 (two) times daily. Pt takes 4 mg in the morning and 2 mg at bedtime.   Yes Historical Provider, MD  sertraline (ZOLOFT) 50 MG  tablet Take 50 mg by mouth daily.   Yes Historical Provider, MD  sevelamer carbonate (RENVELA) 800 MG tablet Take 4,000 mg by mouth 2 (two) times daily.    Yes Historical Provider, MD  topiramate (TOPAMAX) 200 MG tablet Take 200 mg by mouth 2 (two) times daily. Pt takes with a 50 mg tablet.   Yes Historical Provider, MD  topiramate (TOPAMAX) 50 MG tablet Take 50 mg by mouth 2 (two) times daily. Pt takes with a 200 mg tablet.   Yes Historical Provider, MD  Vitamin D, Ergocalciferol, (DRISDOL) 50000 UNITS CAPS capsule Take 50,000 Units by mouth every 30 (thirty) days. Takes on the 8th of every month   Yes Historical Provider, MD    Family History Family History  Problem  Relation Age of Onset  . Heart attack Mother   . Migraines Mother   . Aneurysm Mother   . Diabetes Father     Social History Social History  Substance Use Topics  . Smoking status: Current Every Day Smoker    Packs/day: 0.50    Years: 16.00    Types: Cigarettes  . Smokeless tobacco: Never Used  . Alcohol use No     Comment: occasionally     Allergies   Dhea [nutritional supplements]; Demerol [meperidine]; Floxin [ofloxacin]; Nsaids; Nubain [nalbuphine hcl]; Phenergan [promethazine hcl]; Stadol [butorphanol]; and Erythromycin   Review of Systems Review of Systems  Cardiovascular: Positive for leg swelling.  Musculoskeletal: Positive for arthralgias and myalgias.  All other systems reviewed and are negative.    Physical Exam Updated Vital Signs BP 176/90   Pulse 87   Temp 98.3 F (36.8 C)   Resp 20   Ht 4\' 10"  (1.473 m)   Wt 165 lb 5.5 oz (75 kg)   LMP 11/21/2007 (Approximate) Comment: No period since 2009,due to"DVT" per pt since they stopped BCP...no periods  SpO2 100%   BMI 34.56 kg/m   Physical Exam  Constitutional: She appears well-developed and well-nourished.  HENT:  Head: Normocephalic and atraumatic.  Eyes: Conjunctivae are normal. Right eye exhibits no discharge. Left eye exhibits no  discharge.  Cardiovascular: Normal rate and regular rhythm.   Soft systolic murmur.   Pulmonary/Chest: Effort normal. No respiratory distress.  Bilateral symmetrical lower leg pitting edema. Lungs clear. No increased work on breathing.    Neurological: She is alert. Coordination normal.  Skin: Skin is warm and dry. No rash noted. She is not diaphoretic. No erythema.  Psychiatric: She has a normal mood and affect.  Nursing note and vitals reviewed.    ED Treatments / Results  DIAGNOSTIC STUDIES:  Oxygen Saturation is 100% on RA, normal by my interpretation.    COORDINATION OF CARE:  10:25 AM Discussed treatment plan with pt at bedside which included blood work, Lasix, and x-ray and pt agreed to plan.  Labs (all labs ordered are listed, but only abnormal results are displayed) Labs Reviewed  BASIC METABOLIC PANEL - Abnormal; Notable for the following:       Result Value   BUN 68 (*)    Creatinine, Ser 6.62 (*)    Calcium 7.7 (*)    GFR calc non Af Amer 7 (*)    GFR calc Af Amer 8 (*)    All other components within normal limits  CBC - Abnormal; Notable for the following:    WBC 12.1 (*)    RBC 3.05 (*)    Hemoglobin 9.6 (*)    HCT 30.7 (*)    MCV 100.7 (*)    RDW 21.7 (*)    All other components within normal limits  CBG MONITORING, ED     Radiology Dg Foot Complete Right  Result Date: 06/19/2016 CLINICAL DATA:  Pain after person fell on foot and ankle region EXAM: RIGHT FOOT COMPLETE - 3+ VIEW COMPARISON:  July 04, 2013 FINDINGS: Frontal, oblique and lateral views were obtained. There is remodeling in the distal third and fourth metatarsal regions at sites of previous fractures. On the current examination, there is marked soft tissue swelling dorsally. No acute fracture or dislocation is evident. The joint spaces appear normal. No erosive change. IMPRESSION: There is marked soft tissue swelling dorsally. No acute fracture or dislocation evident. Remodeling in the  distal third and fourth metatarsals  is due to healing from prior fractures in these areas. No apparent arthropathy. Electronically Signed   By: Bretta Bang III M.D.   On: 06/19/2016 11:28    Procedures Procedures (including critical care time)  Medications Ordered in ED Medications  furosemide (LASIX) injection 40 mg (40 mg Intravenous Given 06/19/16 1046)     Initial Impression / Assessment and Plan / ED Course  I have reviewed the triage vital signs and the nursing notes.  Pertinent labs & imaging results that were available during my care of the patient were reviewed by me and considered in my medical decision making (see chart for details).  Clinical Course  Comment By Time  The pt was reevaluated - she was informed of her K and her Cr and her foot xray, she has had her IV lasix and her daily pain meds (prior to arrival).  She will be d/c home to f/u in the ouptatient setting tomorrow at dialysis.   Eber Hong, MD 08/20 1201     Final Clinical Impressions(s) / ED Diagnoses   Final diagnoses:  Foot pain, right  Peripheral edema    New Prescriptions Current Discharge Medication List     I personally performed the services described in this documentation, which was scribed in my presence. The recorded information has been reviewed and is accurate.        Eber Hong, MD 06/19/16 [phone]

## 2016-06-19 NOTE — ED Notes (Signed)
[facility] [phone] was called and made aware of Pt's discharge and report given to [facility]. Group home stated they would set up transport back to their facility by a taxi cab company called [employer].

## 2016-06-21 ENCOUNTER — Emergency Department (HOSPITAL_COMMUNITY)
Admission: EM | Admit: 2016-06-21 | Discharge: 2016-06-21 | Discharge status: Against Medical Advice | Payer: Medicare Other | Attending: Emergency Medicine | Admitting: Emergency Medicine

## 2016-06-21 ENCOUNTER — Encounter (HOSPITAL_COMMUNITY): Payer: Self-pay

## 2016-06-21 ENCOUNTER — Emergency Department (HOSPITAL_COMMUNITY): Payer: Medicare Other

## 2016-06-21 DIAGNOSIS — E039 Hypothyroidism, unspecified: Secondary | ICD-10-CM | POA: Insufficient documentation

## 2016-06-21 DIAGNOSIS — Z79899 Other long term (current) drug therapy: Secondary | ICD-10-CM | POA: Diagnosis not present

## 2016-06-21 DIAGNOSIS — J45909 Unspecified asthma, uncomplicated: Secondary | ICD-10-CM | POA: Diagnosis not present

## 2016-06-21 DIAGNOSIS — F17210 Nicotine dependence, cigarettes, uncomplicated: Secondary | ICD-10-CM | POA: Diagnosis not present

## 2016-06-21 DIAGNOSIS — R0602 Shortness of breath: Secondary | ICD-10-CM | POA: Insufficient documentation

## 2016-06-21 NOTE — Discharge Instructions (Signed)
You were seen in the ED today with difficulty breathing. We wanted to complete some lab tests and chest x-ray but you refused and decided to leave the ED Against Medical Advice. You should feel free to return to the ED at any time to complete for evaluation or for any new or worsening symptoms such as difficulty breathing, chest pain, fever, or severe abdominal pain.

## 2016-06-21 NOTE — ED Provider Notes (Signed)
Emergency Department Provider Note   I have reviewed the triage vital signs and the nursing notes.   HISTORY  Chief Complaint Shortness of Breath   HPI Erin [name] is a 45 y.o. female with PMH of ESRD, asthma, epilepsy, anxiety, depression, HLD, and chronic right hip pain presents to the emergency department for evaluation of difficulty breathing. Patient states that she was at dialysis today but asked physician be terminated because of right hip pain. Smelly 25 minutes after dialysis was over the patient developed sudden onset difficulty breathing without chest pain. Patient notes a rapid deep breathing until presentation the emergency department. No associated fever, chills, productive cough. Symptoms began suddenly without obvious provocation. She improved with time and is not currently requiring O2. No known history of CVT/PE. Patient reports some associated epigastric abdominal discomfort without nausea or vomiting. No diarrhea.   Past Medical History:  Diagnosis Date  . Anxiety   . Asthma   . Bipolar affect, depressed (HCC)   . Blind   . Chronic headaches    Seeing Pain Management  . Constipation   . Depression   . Epilepsy (HCC)   . Fibromyalgia   . History of CVA (cerebrovascular accident)   . History of DVT (deep vein thrombosis)   . Hyperlipidemia   . Hypothyroid   . Kidney failure    Currently on Dialysis  . Narcolepsy   . Psychosis     Patient Active Problem List   Diagnosis Date Noted  . GI bleed 03/04/2016  . Generalized weakness 03/04/2016  . Hypothyroidism 03/04/2016  . GERD (gastroesophageal reflux disease) 03/04/2016  . ESRD on dialysis (HCC) 03/04/2016  . Bipolar 1 disorder, mixed, moderate (HCC) 02/26/2016  . 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 Anemia 02/25/2016    Past Surgical History:  Procedure Laterality Date  . BRAIN SURGERY  1976  . BREAST LUMPECTOMY  2001  .  CHOLECYSTECTOMY  2001  . DG AV DIALYSIS GRAFT DECLOT OR     X 4  . GIVENS CAPSULE STUDY N/A 02/29/2016   Procedure: GIVENS CAPSULE STUDY;  Surgeon: Elnita Maxwell, MD;  Location: [facility] ENDOSCOPY;  Service: Endoscopy;  Laterality: N/A;  . PERIPHERAL VASCULAR CATHETERIZATION N/A 08/21/2015   Procedure: Dialysis/Perma Catheter Insertion;  Surgeon: Renford Dills, MD;  Location: [facility] INVASIVE CV LAB;  Service: Cardiovascular;  Laterality: N/A;  . PERIPHERAL VASCULAR CATHETERIZATION N/A 02/29/2016   Procedure: Dialysis/Perma Catheter Removal;  Surgeon: Annice Needy, MD;  Location: [facility] INVASIVE CV LAB;  Service: Cardiovascular;  Laterality: N/A;  . PITUITARY EXCISION  1976    Current Outpatient Rx  . Order #: [id number] Class: Historical Med  . Order #: [id number] Class: Historical Med  . Order #: [id number] Class: Historical Med  . Order #: [id number] Class: Historical Med  . Order #: [id number] Class: Historical Med  . Order #: [id number] Class: Historical Med  . Order #: [id number] Class: Historical Med  . Order #: [id number] Class: Historical Med  . Order #: [id number] Class: Historical Med  . Order #: [id number] Class: Print  . Order #: [id number] Class: Historical Med  . Order #: [id number] Class: Historical Med  . Order #: [id number] Class: Historical Med  . Order #: [id number] Class: Historical Med  . Order #: [id number] Class: Historical Med  . Order #: [id number] Class: Historical Med  . Order #: [id number] Class: Normal  . Order #: [id number] Class: Historical Med  . Order #: [id number] Class: Historical Med  . Order #: [id number] Class: Historical Med  . Order #: [id number] Class:  Historical Med  . Order #: [id number] Class: Historical Med  . Order #: [id number] Class: Historical Med  . Order #: [id number] Class: Historical Med    Allergies Dhea [nutritional supplements]; Demerol [meperidine]; Floxin [ofloxacin]; Nsaids; Nubain [nalbuphine hcl]; Phenergan [promethazine hcl]; Stadol [butorphanol]; and  Erythromycin  Family History  Problem Relation Age of Onset  . Heart attack Mother   . Migraines Mother   . Aneurysm Mother   . Diabetes Father     Social History Social History  Substance Use Topics  . Smoking status: Current Every Day Smoker    Packs/day: 0.50    Years: 16.00    Types: Cigarettes  . Smokeless tobacco: Never Used  . Alcohol use No     Comment: occasionally    Review of Systems  Constitutional: No fever/chills Eyes: No visual changes. ENT: No sore throat. Cardiovascular: Denies chest pain. Respiratory: Positive shortness of breath. Gastrointestinal: No abdominal pain.  No nausea, no vomiting.  No diarrhea.  No constipation. Genitourinary: Negative for dysuria. Musculoskeletal: Negative for back pain. Positive chronic right hip pain.  Skin: Negative for rash. Neurological: Negative for headaches, focal weakness or numbness.  10-point ROS otherwise negative.  ____________________________________________   PHYSICAL EXAM:  VITAL SIGNS: ED Triage Vitals  Enc Vitals Group     BP 06/21/16 1619 182/87     Pulse Rate 06/21/16 1619 66     Resp 06/21/16 1619 20     Temp 06/21/16 1619 98.5 F (36.9 C)     Temp Source 06/21/16 1619 Oral     SpO2 06/21/16 1619 97 %     Weight 06/21/16 1621 166 lb 10.7 oz (75.6 kg)     Height 06/21/16 1619 4\' 10"  (1.473 m)   Constitutional: Alert and oriented. Well appearing and in no acute distress. Eyes: Conjunctivae are normal.  Head: Atraumatic. Nose: No congestion/rhinnorhea. Mouth/Throat: Mucous membranes are moist.  Oropharynx non-erythematous. Neck: No stridor.   Cardiovascular: Normal rate, regular rhythm. Good peripheral circulation. Patient with systolic murmur appreciated over left sternal boarder.   Respiratory: Normal respiratory effort.  No retractions. Lungs CTAB. Gastrointestinal: Soft and nontender. No distention.  Musculoskeletal: No lower extremity tenderness. 1+ pitting edema in B/L LE. No gross  deformities of extremities. Neurologic:  Normal speech and language. No gross focal neurologic deficits are appreciated.  Skin:  Skin is warm, dry and intact. No rash noted. Psychiatric: Mood and affect are normal. Speech and behavior are normal.  ____________________________________________   LABS Left prior to labs  ____________________________________________  EKG  Reviewed in MUSE. No STEMI.  ____________________________________________  RADIOLOGY  Left AMA prior to imaging.  ____________________________________________   PROCEDURES  Procedure(s) performed:   Procedures  None ____________________________________________   INITIAL IMPRESSION / ASSESSMENT AND PLAN / ED COURSE  Pertinent labs & imaging results that were available during my care of the patient were reviewed by me and considered in my medical decision making (see chart for details).  Patient resents the emergency department for evaluation of sudden onset difficulty breathing without chest pain. She has multiple medical comorbidities including end-stage renal disease and blindness. Hemodialysis was cut short at the patient's request because of right hip pain. Difficult to breathing developed approximately 25 minutes afterwards. On arrival the patient was hyperventilating and was able to be coached by nursing staff to control breathing rate. No oxygen requirement. No chest pain. Plan for baseline labs and chest x-ray. No clinical evidence to suggest DVT/PE other than subjective SOB that has since resolved. EKG similar to  prior with no acute ischemia. Low suspicion for ACS or infectious process.   05:59 PM Patient has decided to leave AGAINST MEDICAL ADVICE. We have been unable to obtain any lab tests or chest x-ray to evaluate for potentially life-threatening underlying disease process. Patient understands that we could be missing potentially life-threatening medical problem. She understands and would like to  leave. Discussed in detail that she is welcome to return to the emergency department at any time and should return or she develops any difficulty breathing, chest pain, fever, abdominal pain or other symptoms concerning for her. She will follow up for routine hemodialysis as scheduled.  ____________________________________________  FINAL CLINICAL IMPRESSION(S) / ED DIAGNOSES  Final diagnoses:  SOB (shortness of breath)    MEDICATIONS GIVEN DURING THIS VISIT:  None  NEW OUTPATIENT MEDICATIONS STARTED DURING THIS VISIT:  None   Note:  This document was prepared using Dragon voice recognition software and may include unintentional dictation errors.  Alona BeneJoshua Chizuko Trine, MD Emergency Medicine   Maia PlanJoshua G Fatou Dunnigan, MD 06/21/16 [phone]

## 2016-06-21 NOTE — ED Triage Notes (Signed)
Pt here from dialysis for evaluation of SOB. Pt states she was unable to finish her dialysis due to be SOB. Pt anxious and hyperventilating on arrival to ED

## 2016-06-21 NOTE — ED Notes (Signed)
Called number provided by pt to transport home

## 2016-06-21 NOTE — ED Notes (Signed)
Pt refusing blood and cxr. States she is ready to leave and signed out AMA

## 2016-06-27 ENCOUNTER — Inpatient Hospital Stay (HOSPITAL_COMMUNITY)
Admission: EM | Admit: 2016-06-27 | Discharge: 2016-06-30 | DRG: 640 | Disposition: A | Discharge status: Nursing Home (Includes ICF) | Payer: Medicare Other | Attending: Family Medicine | Admitting: Family Medicine

## 2016-06-27 ENCOUNTER — Emergency Department (HOSPITAL_COMMUNITY): Payer: Medicare Other

## 2016-06-27 ENCOUNTER — Encounter (HOSPITAL_COMMUNITY): Payer: Self-pay

## 2016-06-27 DIAGNOSIS — F17210 Nicotine dependence, cigarettes, uncomplicated: Secondary | ICD-10-CM | POA: Diagnosis present

## 2016-06-27 DIAGNOSIS — Z9049 Acquired absence of other specified parts of digestive tract: Secondary | ICD-10-CM

## 2016-06-27 DIAGNOSIS — M797 Fibromyalgia: Secondary | ICD-10-CM | POA: Diagnosis present

## 2016-06-27 DIAGNOSIS — Z7189 Other specified counseling: Secondary | ICD-10-CM

## 2016-06-27 DIAGNOSIS — F3162 Bipolar disorder, current episode mixed, moderate: Secondary | ICD-10-CM | POA: Diagnosis present

## 2016-06-27 DIAGNOSIS — G40909 Epilepsy, unspecified, not intractable, without status epilepticus: Secondary | ICD-10-CM | POA: Diagnosis present

## 2016-06-27 DIAGNOSIS — D631 Anemia in chronic kidney disease: Secondary | ICD-10-CM

## 2016-06-27 DIAGNOSIS — G47419 Narcolepsy without cataplexy: Secondary | ICD-10-CM | POA: Diagnosis present

## 2016-06-27 DIAGNOSIS — G8929 Other chronic pain: Secondary | ICD-10-CM | POA: Diagnosis present

## 2016-06-27 DIAGNOSIS — N189 Chronic kidney disease, unspecified: Secondary | ICD-10-CM

## 2016-06-27 DIAGNOSIS — Z9119 Patient's noncompliance with other medical treatment and regimen: Secondary | ICD-10-CM | POA: Diagnosis not present

## 2016-06-27 DIAGNOSIS — E46 Unspecified protein-calorie malnutrition: Secondary | ICD-10-CM | POA: Diagnosis present

## 2016-06-27 DIAGNOSIS — Z8673 Personal history of transient ischemic attack (TIA), and cerebral infarction without residual deficits: Secondary | ICD-10-CM

## 2016-06-27 DIAGNOSIS — Z515 Encounter for palliative care: Secondary | ICD-10-CM | POA: Diagnosis not present

## 2016-06-27 DIAGNOSIS — Z8249 Family history of ischemic heart disease and other diseases of the circulatory system: Secondary | ICD-10-CM | POA: Diagnosis not present

## 2016-06-27 DIAGNOSIS — Z992 Dependence on renal dialysis: Secondary | ICD-10-CM

## 2016-06-27 DIAGNOSIS — D649 Anemia, unspecified: Secondary | ICD-10-CM | POA: Diagnosis present

## 2016-06-27 DIAGNOSIS — E893 Postprocedural hypopituitarism: Secondary | ICD-10-CM | POA: Diagnosis present

## 2016-06-27 DIAGNOSIS — B369 Superficial mycosis, unspecified: Secondary | ICD-10-CM | POA: Diagnosis present

## 2016-06-27 DIAGNOSIS — Z808 Family history of malignant neoplasm of other organs or systems: Secondary | ICD-10-CM | POA: Diagnosis not present

## 2016-06-27 DIAGNOSIS — Z86718 Personal history of other venous thrombosis and embolism: Secondary | ICD-10-CM

## 2016-06-27 DIAGNOSIS — E162 Hypoglycemia, unspecified: Secondary | ICD-10-CM | POA: Diagnosis present

## 2016-06-27 DIAGNOSIS — J45909 Unspecified asthma, uncomplicated: Secondary | ICD-10-CM | POA: Diagnosis present

## 2016-06-27 DIAGNOSIS — R401 Stupor: Secondary | ICD-10-CM

## 2016-06-27 DIAGNOSIS — G2581 Restless legs syndrome: Secondary | ICD-10-CM | POA: Diagnosis present

## 2016-06-27 DIAGNOSIS — G9341 Metabolic encephalopathy: Secondary | ICD-10-CM | POA: Diagnosis present

## 2016-06-27 DIAGNOSIS — E8889 Other specified metabolic disorders: Secondary | ICD-10-CM | POA: Diagnosis present

## 2016-06-27 DIAGNOSIS — Z79899 Other long term (current) drug therapy: Secondary | ICD-10-CM | POA: Diagnosis not present

## 2016-06-27 DIAGNOSIS — Z9115 Patient's noncompliance with renal dialysis: Secondary | ICD-10-CM | POA: Diagnosis not present

## 2016-06-27 DIAGNOSIS — G9340 Encephalopathy, unspecified: Secondary | ICD-10-CM | POA: Diagnosis not present

## 2016-06-27 DIAGNOSIS — N186 End stage renal disease: Secondary | ICD-10-CM | POA: Diagnosis present

## 2016-06-27 DIAGNOSIS — H540 Blindness, both eyes: Secondary | ICD-10-CM

## 2016-06-27 DIAGNOSIS — H548 Legal blindness, as defined in USA: Secondary | ICD-10-CM | POA: Diagnosis present

## 2016-06-27 DIAGNOSIS — Z79890 Hormone replacement therapy (postmenopausal): Secondary | ICD-10-CM

## 2016-06-27 DIAGNOSIS — Z833 Family history of diabetes mellitus: Secondary | ICD-10-CM

## 2016-06-27 DIAGNOSIS — E785 Hyperlipidemia, unspecified: Secondary | ICD-10-CM | POA: Diagnosis present

## 2016-06-27 DIAGNOSIS — R4182 Altered mental status, unspecified: Secondary | ICD-10-CM | POA: Diagnosis present

## 2016-06-27 DIAGNOSIS — E039 Hypothyroidism, unspecified: Secondary | ICD-10-CM | POA: Diagnosis present

## 2016-06-27 DIAGNOSIS — K219 Gastro-esophageal reflux disease without esophagitis: Secondary | ICD-10-CM | POA: Diagnosis present

## 2016-06-27 DIAGNOSIS — H547 Unspecified visual loss: Secondary | ICD-10-CM | POA: Diagnosis present

## 2016-06-27 LAB — CBC WITH DIFFERENTIAL/PLATELET
BASOS ABS: 0 10*3/uL (ref 0.0–0.1)
Basophils Relative: 0 %
Eosinophils Absolute: 0 10*3/uL (ref 0.0–0.7)
Eosinophils Relative: 0 %
HEMATOCRIT: 23.8 % — AB (ref 36.0–46.0)
Hemoglobin: 7.4 g/dL — ABNORMAL LOW (ref 12.0–15.0)
LYMPHS PCT: 6 %
Lymphs Abs: 0.5 10*3/uL — ABNORMAL LOW (ref 0.7–4.0)
MCH: 30.5 pg (ref 26.0–34.0)
MCHC: 31.1 g/dL (ref 30.0–36.0)
MCV: 97.9 fL (ref 78.0–100.0)
MONO ABS: 0.3 10*3/uL (ref 0.1–1.0)
Monocytes Relative: 4 %
NEUTROS ABS: 8.9 10*3/uL — AB (ref 1.7–7.7)
Neutrophils Relative %: 90 %
Platelets: 156 10*3/uL (ref 150–400)
RBC: 2.43 MIL/uL — AB (ref 3.87–5.11)
RDW: 19.8 % — ABNORMAL HIGH (ref 11.5–15.5)
WBC: 9.8 10*3/uL (ref 4.0–10.5)

## 2016-06-27 LAB — BASIC METABOLIC PANEL
ANION GAP: 8 (ref 5–15)
BUN: 65 mg/dL — AB (ref 6–20)
CALCIUM: 7.1 mg/dL — AB (ref 8.9–10.3)
CO2: 18 mmol/L — AB (ref 22–32)
CREATININE: 6.89 mg/dL — AB (ref 0.44–1.00)
Chloride: 108 mmol/L (ref 101–111)
GFR calc Af Amer: 8 mL/min — ABNORMAL LOW (ref >60)
GFR calc non Af Amer: 7 mL/min — ABNORMAL LOW (ref >60)
GLUCOSE: 91 mg/dL (ref 65–99)
Potassium: 3.9 mmol/L (ref 3.5–5.1)
Sodium: 134 mmol/L — ABNORMAL LOW (ref 135–145)

## 2016-06-27 LAB — HEPATIC FUNCTION PANEL
ALBUMIN: 3.1 g/dL — AB (ref 3.5–5.0)
ALK PHOS: 354 U/L — AB (ref 38–126)
ALT: 30 U/L (ref 14–54)
AST: 83 U/L — ABNORMAL HIGH (ref 15–41)
Bilirubin, Direct: 0.6 mg/dL — ABNORMAL HIGH (ref 0.1–0.5)
Indirect Bilirubin: 0.8 mg/dL (ref 0.3–0.9)
TOTAL PROTEIN: 5.6 g/dL — AB (ref 6.5–8.1)
Total Bilirubin: 1.4 mg/dL — ABNORMAL HIGH (ref 0.3–1.2)

## 2016-06-27 LAB — CBG MONITORING, ED
GLUCOSE-CAPILLARY: 120 mg/dL — AB (ref 65–99)
Glucose-Capillary: 188 mg/dL — ABNORMAL HIGH (ref 65–99)
Glucose-Capillary: 38 mg/dL — CL (ref 65–99)
Glucose-Capillary: 55 mg/dL — ABNORMAL LOW (ref 65–99)
Glucose-Capillary: 96 mg/dL (ref 65–99)
Glucose-Capillary: 68 mg/dL (ref 65–99)

## 2016-06-27 LAB — AMMONIA: AMMONIA: 46 umol/L — AB (ref 9–35)

## 2016-06-27 MED ORDER — NALOXONE HCL 0.4 MG/ML IJ SOLN
INTRAMUSCULAR | Status: AC
  Filled 2016-06-27: qty 1

## 2016-06-27 MED ORDER — DEXTROSE 50 % IV SOLN
INTRAVENOUS | Status: AC
  Filled 2016-06-27: qty 50

## 2016-06-27 MED ORDER — DEXTROSE 50 % IV SOLN
1.0000 | Freq: Once | INTRAVENOUS | Status: AC
  Administered 2016-06-27: 50 mL via INTRAVENOUS

## 2016-06-27 MED ORDER — TOPIRAMATE (TOPAMAX) NICU/PEDS ORAL SOLN 20 MG/ML: 250.0000 mg | Freq: Two times a day (BID) | ORAL | Status: DC

## 2016-06-27 MED ORDER — DEXTROSE 5 % IV SOLN
Freq: Once | INTRAVENOUS | Status: AC
  Administered 2016-06-27: 75 mL via INTRAVENOUS

## 2016-06-27 MED ORDER — LEVOTHYROXINE SODIUM 112 MCG PO TABS
112.0000 ug | ORAL_TABLET | Freq: Every day | ORAL | Status: DC
  Administered 2016-06-28 – 2016-06-30 (×3): 112 ug via ORAL
  Filled 2016-06-27 (×3): qty 1

## 2016-06-27 MED ORDER — HYDROCORTISONE NA SUCCINATE PF 100 MG IJ SOLR
25.0000 mg | Freq: Three times a day (TID) | INTRAMUSCULAR | Status: DC
  Administered 2016-06-27 – 2016-06-28 (×2): 25 mg via INTRAVENOUS
  Filled 2016-06-27 (×2): qty 2

## 2016-06-27 MED ORDER — DEXTROSE 50 % IV SOLN
INTRAVENOUS | Status: AC
  Administered 2016-06-27: 50 mL via INTRAVENOUS
  Filled 2016-06-27: qty 50

## 2016-06-27 MED ORDER — ENOXAPARIN SODIUM 30 MG/0.3ML ~~LOC~~ SOLN
30.0000 mg | SUBCUTANEOUS | Status: DC
  Filled 2016-06-27 (×2): qty 0.3

## 2016-06-27 MED ORDER — NYSTATIN 100000 UNIT/GM EX CREA
TOPICAL_CREAM | Freq: Two times a day (BID) | CUTANEOUS | Status: DC
  Administered 2016-06-27 – 2016-06-30 (×4): via TOPICAL
  Filled 2016-06-27: qty 15

## 2016-06-27 MED ORDER — NALOXONE HCL 0.4 MG/ML IJ SOLN
0.2000 mg | Freq: Once | INTRAMUSCULAR | Status: AC
  Administered 2016-06-27: 0.2 mg via INTRAVENOUS
  Filled 2016-06-27: qty 1

## 2016-06-27 MED ORDER — TOPIRAMATE 100 MG PO TABS
250.0000 mg | ORAL_TABLET | Freq: Two times a day (BID) | ORAL | Status: DC
  Administered 2016-06-27: 250 mg via ORAL
  Filled 2016-06-27 (×4): qty 2

## 2016-06-27 MED ORDER — NYSTATIN 100000 UNIT/GM EX CREA
TOPICAL_CREAM | CUTANEOUS | Status: AC
  Filled 2016-06-27: qty 15

## 2016-06-27 MED ORDER — SODIUM CHLORIDE 0.9% FLUSH: 3.0000 mL | INTRAVENOUS | Status: DC | PRN

## 2016-06-27 MED ORDER — ACETAMINOPHEN 650 MG RE SUPP: 650.0000 mg | Freq: Four times a day (QID) | RECTAL | Status: DC | PRN

## 2016-06-27 MED ORDER — ESTRADIOL 0.1 MG/GM VA CREA
1.0000 | TOPICAL_CREAM | VAGINAL | Status: DC
  Filled 2016-06-27: qty 42.5

## 2016-06-27 MED ORDER — SODIUM CHLORIDE 0.9 % IV SOLN: 250.0000 mL | INTRAVENOUS | Status: DC | PRN

## 2016-06-27 MED ORDER — RISPERIDONE 1 MG PO TABS
4.0000 mg | ORAL_TABLET | Freq: Every morning | ORAL | Status: DC
  Administered 2016-06-28 – 2016-06-30 (×3): 4 mg via ORAL
  Filled 2016-06-27 (×3): qty 4

## 2016-06-27 MED ORDER — ACETAMINOPHEN 325 MG PO TABS
650.0000 mg | ORAL_TABLET | Freq: Four times a day (QID) | ORAL | Status: DC | PRN
Start: 2016-06-27 — End: 2016-06-30
  Administered 2016-06-28 – 2016-06-29 (×5): 650 mg via ORAL
  Filled 2016-06-27 (×5): qty 2

## 2016-06-27 MED ORDER — RISPERIDONE 1 MG PO TABS
2.0000 mg | ORAL_TABLET | Freq: Every day | ORAL | Status: DC
  Administered 2016-06-27 – 2016-06-29 (×3): 2 mg via ORAL
  Filled 2016-06-27 (×4): qty 2

## 2016-06-27 NOTE — ED Notes (Signed)
Pt responded to pain with "aww" when pinched. After d50 given

## 2016-06-27 NOTE — ED Triage Notes (Signed)
Pt here for evaluation of low blood sugar. Per EMS, blood sugar was 32. Pt was given glucagon by EMS prior to arrival.

## 2016-06-27 NOTE — ED Notes (Signed)
Pt responds to name calling loudly. Will not open eyes. Pt having jerk like movements to all over body.

## 2016-06-27 NOTE — ED Notes (Signed)
Pt taken to ct 

## 2016-06-27 NOTE — ED Notes (Signed)
MD Aware pt is now alert, coughing and grunting

## 2016-06-27 NOTE — H&P (Addendum)
SNF/ ALF, ESRD, non compliant,  Most 1hr HD , BS 32, on D5 drip, got glucagon. Missed HD today.    MWF  Vol ok   Admit hypoglycemia. NH3 46   Hb 7.4  K 3.9  SDU / inpatient  On po dilaudid   [facility] History and Physical  Erin [name]:[id number] DOB: 18-Apr-1971 DOA: 06/27/2016  Referring physician: Dr Rubin Payor PCP: [facility] PRIMARY CARE   Chief Complaint: Hypoglycemia  HPI: Erin [name] is a 45 y.o. female with hx of CVA, dVT, brain tumor/ blindness, ESRD on HD, psychosis, FM, depression, chron HA's, bipolar.  Pt sent from home where she lives (not sure the name) for AMS.  EMS found low BS in 30's.  Brought to ED .  Was poorly responsive at first but now is talking and responding, but confused.  BS improved with IV glucose to 90's, now back down in the 60's.  Pt very poor historian but can answer some simple questions. Says she has been on dialysis for 4.5 years, won't say where or what days though.  Perseverating.    On Care Everywhere patient is seen for primary care at [facility], goes to [facility] pain clinic also. 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  Hx poor compliance w HD and other issues. Her mother and primary caretaker died early this year 2016/03/27.  Pt moved to an ALF.  Now she is living in a "home", [facility] since 03/27/16.  Chronic long-term mental illness.  On chronic pain meds with fentanyl patch, ocy and hydromorphone at home.    Past Medical History  Past Medical History:  Diagnosis Date  . Anxiety   . Asthma   . Bipolar affect, depressed (HCC)   . Blind   . Chronic headaches    Seeing Pain Management  . Constipation   . Depression   . Epilepsy (HCC)   . Fibromyalgia   . History of CVA (cerebrovascular accident)   . History of DVT (deep vein thrombosis)   . Hyperlipidemia   . Hypothyroid   . Kidney failure    Currently on  Dialysis  . Narcolepsy   . Psychosis    Past Surgical History  Past Surgical History:  Procedure Laterality Date  . BRAIN SURGERY  1976  . BREAST LUMPECTOMY  03-27-2000  . CHOLECYSTECTOMY  03/27/2000  . DG AV DIALYSIS GRAFT DECLOT OR     X 4  . GIVENS CAPSULE STUDY N/A 02/29/2016   Procedure: GIVENS CAPSULE STUDY;  Surgeon: Elnita Maxwell, MD;  Location: [facility] ENDOSCOPY;  Service: Endoscopy;  Laterality: N/A;  . PERIPHERAL VASCULAR CATHETERIZATION N/A 08/21/2015   Procedure: Dialysis/Perma Catheter Insertion;  Surgeon: Renford Dills, MD;  Location: [facility] INVASIVE CV LAB;  Service: Cardiovascular;  Laterality: N/A;  . PERIPHERAL VASCULAR CATHETERIZATION N/A 02/29/2016   Procedure: Dialysis/Perma Catheter Removal;  Surgeon: Annice Needy, MD;  Location: [facility] INVASIVE CV LAB;  Service: Cardiovascular;  Laterality: N/A;  . PITUITARY EXCISION  1976   Family History  Family History  Problem Relation Age of Onset  . Heart attack Mother   . Migraines Mother   . Aneurysm Mother   . Diabetes Father    Social History  reports that she has been smoking Cigarettes.  She has a 8.00 pack-year smoking history. She has never used smokeless tobacco. She reports that she does not drink  alcohol or use drugs. Allergies  Allergies  Allergen Reactions  . Dhea [Nutritional Supplements] Anaphylaxis    Patient states medication is DHE for migraines, not DHEA  . Demerol [Meperidine] Other (See Comments)    Reaction:  Hallucinations   . Floxin [Ofloxacin] Other (See Comments)    Reaction:  Hallucinations   . Nsaids Nausea And Vomiting and Swelling  . Nubain [Nalbuphine Hcl] Other (See Comments)    Reaction:  Hallucinations   . Phenergan [Promethazine Hcl] Other (See Comments)    Reaction:  Restless legs   . Stadol [Butorphanol] Other (See Comments)    Reaction:  Hallucinations   . Erythromycin Diarrhea and Rash   Home medications Prior to Admission medications   Medication Sig Start Date End Date Taking?  Authorizing Provider  estradiol (ESTRACE) 0.1 MG/GM vaginal cream Place 1 Applicatorful vaginally 2 (two) times a week. Mondays and Thursdays   Yes Historical Provider, MD  fludrocortisone (FLORINEF) 0.1 MG tablet Take 0.1 mg by mouth daily.   Yes Historical Provider, MD  furosemide (LASIX) 40 MG tablet Take 40 mg by mouth 2 (two) times daily.   Yes Historical Provider, MD  gabapentin (NEURONTIN) 100 MG capsule Take 400 mg by mouth at bedtime.   Yes Historical Provider, MD  hydrocortisone (CORTEF) 10 MG tablet Take 5-10 mg by mouth 2 (two) times daily. Pt takes 10 mg in the morning and 5 mg at bedtime.   Yes Historical Provider, MD  HYDROmorphone (DILAUDID) 4 MG tablet Take 1.5 tablets (6 mg total) by mouth every 6 (six) hours as needed for severe pain. Patient taking differently: Take 6 mg by mouth every 8 (eight) hours as needed for moderate pain or severe pain.  03/07/16  Yes Auburn BilberryShreyang Patel, MD  levothyroxine (SYNTHROID, LEVOTHROID) 112 MCG tablet Take 112 mcg by mouth daily.    Yes Historical Provider, MD  loratadine (CLARITIN) 10 MG tablet Take 10 mg by mouth daily.   Yes Historical Provider, MD  lovastatin (MEVACOR) 40 MG tablet Take 20 mg by mouth every evening.    Yes Historical Provider, MD  Melatonin 5 MG TABS Take 2 tablets by mouth at bedtime.   Yes Historical Provider, MD  pantoprazole (PROTONIX) 40 MG tablet Take 1 tablet (40 mg total) by mouth daily. 02/27/16  Yes Adrian SaranSital Mody, MD  risperidone (RISPERDAL) 4 MG tablet Take 2-4 mg by mouth 2 (two) times daily. Pt takes 4 mg in the morning and 2 mg at bedtime.   Yes Historical Provider, MD  sertraline (ZOLOFT) 50 MG tablet Take 50 mg by mouth daily.   Yes Historical Provider, MD  sevelamer carbonate (RENVELA) 800 MG tablet Take 2,400 mg by mouth 2 (two) times daily.    Yes Historical Provider, MD  topiramate (TOPAMAX) 200 MG tablet Take 200 mg by mouth 2 (two) times daily. Pt takes with a 50 mg tablet.   Yes Historical Provider, MD   topiramate (TOPAMAX) 50 MG tablet Take 50 mg by mouth 2 (two) times daily. Pt takes with a 200 mg tablet.   Yes Historical Provider, MD  Vitamin D, Ergocalciferol, (DRISDOL) 50000 UNITS CAPS capsule Take 50,000 Units by mouth every 30 (thirty) days. Takes on the 8th of every month   Yes Historical Provider, MD   Liver Function Tests  Recent Labs Lab 06/27/16 1420  AST 83*  ALT 30  ALKPHOS 354*  BILITOT 1.4*  PROT 5.6*  ALBUMIN 3.1*   No results for input(s): LIPASE, AMYLASE in the last  168 hours. CBC  Recent Labs Lab 06/27/16 1602  WBC 9.8  NEUTROABS 8.9*  HGB 7.4*  HCT 23.8*  MCV 97.9  PLT 156   Basic Metabolic Panel  Recent Labs Lab 06/27/16 1420  NA 134*  K 3.9  CL 108  CO2 18*  GLUCOSE 91  BUN 65*  CREATININE 6.89*  CALCIUM 7.1*     Vitals:   06/27/16 1600 06/27/16 1630 06/27/16 1700 06/27/16 1730  BP: (!) 105/52 110/91 (!) 135/108 123/57  Pulse: 63   71  Resp: 11 14 26 17   Temp:      TempSrc:      SpO2: 97%   95%  Weight:       Exam: Gen eyes closed, gets agitated if you touch her legs, somnolent but arouses to sternal rub and voice Slight frequent twitching of face/ shoulder/ arms Skin is pale and darkened brownish Looks very chron ill Sclera anicteric, throat clear and dry  No jvd or bruits Chest clear bilat RRR no MRG Abd soft obese large rash in SP region/ fungal rash w odor, +BS, no rebound GU defer MS no joint effusion /deformity Ext 2+ pitting  bilat LE edema, very mild erythema RLE > LLE / no wounds or ulcers Neuro is somnolent, Ox 1, nonfocal, gen weakness, diffuse mild twitching   Na 134  K 3.9  BUN 65  Cr 6.89  CO2 18   Ca 7.1  Alb 3.1   NH3 46  LFT's ok WBC 9k  Hb 7.4  plt 156  Home meds >> estrace, florinef, lasix, neurontin, HC, dilaudid, synthroid, Claritin, Mevacor, PPI, Risperdal, zoloft, Renvela, Topamax, vit D  CXR (independ reviewed) > small lung fields, clear no active disease CT head >>> 1. Stable appearance of  the brain with right frontal encephalomalacia underlying the region of calvarial thinning like alert related to remote prior surgery. No acute intr acranial findings. 2. Mild cerebral atrophy for age. 3. Mildly prominent fluid density sella compatible with empty or partially empty sella. This is likewise chronically stable.  Assessment: 1.  Hypoglycemia - no signs of sepsis, could be due to malnutrition/ ESRD. Not on any diabetes medications. Cont IV D5W for now, admit to SDU.  2.  ESRD usual HD tiw.  Missed HD today.  Will consult renal.  K+ ok, has LE edema but clear lungs. Her nephrologist is from [facility].  Spoke w neph on call, Dr Fausto Skillern will see her tomorrow. 3.  Bipolar / depression - cont risperdal if can take po 4.  Blindness 5.  Chron pain/ HA's - hold po meds, use IV only if wakes up more first 6.  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  Cont HC.  7.  Hypothyroid - cont synthroid 8.  Debility - not sure baseline, looks very ill chronically 9.  AMS - combination of hypoglycemia and possibly uremia or drug side effects/ overdose. Got Narcan x 1 in ED w some response.  10.  CVA 2002, hx seizures since then - seen by neurology at [facility] in Feb, did EEG in April which was + for spikes , possibly epilepsy. In Feb and now is on Topiramate as only seizure med, 250 mg bid. 11.  Fungal rash - lower mid abdomen, plan nystatin powder/ cream, wound consult   Plan - as above. Poor prognosis. Full code. Hold oral meds until more awake, except Topamax, give as liquid as tolerated. Give IV steroids for hypopit.  No family here or  in chart, called "friends" w/o answer.  Pall care consult for GOC.       Maree Krabbe [facility] Pager [phone]  Cell [phone]  If 7PM-7AM, please contact night-coverage [email or web address] Password [facility] 06/27/2016, 6:12 PM

## 2016-06-27 NOTE — ED Notes (Signed)
Pt talking now after narcan, pt coughing, will stop to talk.

## 2016-06-27 NOTE — ED Notes (Signed)
Pt opening eyes now. Pt legally blind. Pt was incontinent. Pt and bed cleaned. Pt has raw skin to lower abd and in folds.

## 2016-06-27 NOTE — ED Notes (Signed)
Report given to Felicity Coyer in ICU

## 2016-06-27 NOTE — ED Provider Notes (Signed)
[facility]-EMERGENCY DEPT Provider Note   CSN: [id number] Arrival date & time: 06/27/16  1350   Low 5 caveat due to altered mental status.  History   Chief Complaint Chief Complaint  Patient presents with  . Hypoglycemia    HPI Erin [name] is a 45 y.o. female.  The history is provided by the patient and the EMS personnel.  Hypoglycemia  Patient presents with hypoglycemia. Reportedly had sugars in the 30s. Given glucagon by EMS and then had sugars still 30s. Given an amp of D50 here. Sugars were up briefly then dropped back down to the 50s. Decreased mental status. Reportedly also is a dialysis patient and did not go today. 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 Also appears to be on chronic pain medicines. Reportedly when she does go dialysis only go for 1 hour.  Past Medical History:  Diagnosis Date  . Anxiety   . Asthma   . Bipolar affect, depressed (HCC)   . Blind   . Chronic headaches    Seeing Pain Management  . Constipation   . Depression   . Epilepsy (HCC)   . Fibromyalgia   . History of CVA (cerebrovascular accident)   . History of DVT (deep vein thrombosis)   . Hyperlipidemia   . Hypothyroid   . Kidney failure    Currently on Dialysis  . Narcolepsy   . Psychosis     Patient Active Problem List   Diagnosis Date Noted  . Hypoglycemia 06/27/2016  . GI bleed 03/04/2016  . Generalized weakness 03/04/2016  . Hypothyroidism 03/04/2016  . GERD (gastroesophageal reflux disease) 03/04/2016  . ESRD on dialysis (HCC) 03/04/2016  . Bipolar 1 disorder, mixed, moderate (HCC) 02/26/2016  . 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 Anemia 02/25/2016    Past Surgical History:  Procedure Laterality Date  . BRAIN SURGERY  1976  . BREAST LUMPECTOMY  2001  . CHOLECYSTECTOMY  2001  . DG AV DIALYSIS GRAFT DECLOT OR     X 4  . GIVENS CAPSULE STUDY N/A 02/29/2016   Procedure: GIVENS CAPSULE STUDY;  Surgeon: Elnita Maxwell, MD;  Location: [facility] ENDOSCOPY;  Service: Endoscopy;  Laterality: N/A;  . PERIPHERAL VASCULAR CATHETERIZATION N/A 08/21/2015   Procedure: Dialysis/Perma Catheter Insertion;  Surgeon: Renford Dills, MD;  Location: [facility] INVASIVE CV LAB;  Service: Cardiovascular;  Laterality: N/A;  . PERIPHERAL VASCULAR CATHETERIZATION N/A 02/29/2016   Procedure: Dialysis/Perma Catheter Removal;  Surgeon: Annice Needy, MD;  Location: [facility] INVASIVE CV LAB;  Service: Cardiovascular;  Laterality: N/A;  . PITUITARY EXCISION  1976    OB History    No data available       Home Medications    Prior to Admission medications   Medication Sig Start Date End Date Taking? Authorizing Provider  estradiol (ESTRACE) 0.1 MG/GM vaginal cream Place 1 Applicatorful vaginally 2 (two) times a week. Mondays and Thursdays   Yes Historical Provider, MD  fludrocortisone (FLORINEF) 0.1 MG tablet Take 0.1 mg by mouth daily.   Yes Historical Provider, MD  furosemide (LASIX) 40 MG tablet Take 40 mg by mouth 2 (two) times daily.   Yes Historical Provider, MD  gabapentin (NEURONTIN) 100 MG capsule Take 400 mg by mouth at bedtime.   Yes Historical Provider, MD  hydrocortisone (CORTEF) 10 MG tablet Take 5-10 mg by mouth 2 (two) times daily. Pt takes 10 mg in the morning and 5 mg at  bedtime.   Yes Historical Provider, MD  HYDROmorphone (DILAUDID) 4 MG tablet Take 1.5 tablets (6 mg total) by mouth every 6 (six) hours as needed for severe pain. Patient taking differently: Take 6 mg by mouth every 8 (eight) hours as needed for moderate pain or severe pain.  03/07/16  Yes Auburn BilberryShreyang Patel, MD  levothyroxine (SYNTHROID, LEVOTHROID) 112 MCG tablet Take 112 mcg by mouth daily.    Yes Historical Provider, MD  loratadine (CLARITIN) 10 MG tablet Take 10 mg by mouth daily.   Yes Historical Provider, MD  lovastatin (MEVACOR) 40 MG tablet Take 20 mg by mouth every evening.    Yes Historical  Provider, MD  Melatonin 5 MG TABS Take 2 tablets by mouth at bedtime.   Yes Historical Provider, MD  pantoprazole (PROTONIX) 40 MG tablet Take 1 tablet (40 mg total) by mouth daily. 02/27/16  Yes Adrian SaranSital Mody, MD  risperidone (RISPERDAL) 4 MG tablet Take 2-4 mg by mouth 2 (two) times daily. Pt takes 4 mg in the morning and 2 mg at bedtime.   Yes Historical Provider, MD  sertraline (ZOLOFT) 50 MG tablet Take 50 mg by mouth daily.   Yes Historical Provider, MD  sevelamer carbonate (RENVELA) 800 MG tablet Take 2,400 mg by mouth 2 (two) times daily.    Yes Historical Provider, MD  topiramate (TOPAMAX) 200 MG tablet Take 200 mg by mouth 2 (two) times daily. Pt takes with a 50 mg tablet.   Yes Historical Provider, MD  topiramate (TOPAMAX) 50 MG tablet Take 50 mg by mouth 2 (two) times daily. Pt takes with a 200 mg tablet.   Yes Historical Provider, MD  Vitamin D, Ergocalciferol, (DRISDOL) 50000 UNITS CAPS capsule Take 50,000 Units by mouth every 30 (thirty) days. Takes on the 8th of every month   Yes Historical Provider, MD    Family History Family History  Problem Relation Age of Onset  . Heart attack Mother   . Migraines Mother   . Aneurysm Mother   . Diabetes Father     Social History Social History  Substance Use Topics  . Smoking status: Current Every Day Smoker    Packs/day: 0.50    Years: 16.00    Types: Cigarettes  . Smokeless tobacco: Never Used  . Alcohol use No     Comment: occasionally     Allergies   Dhea [nutritional supplements]; Demerol [meperidine]; Floxin [ofloxacin]; Nsaids; Nubain [nalbuphine hcl]; Phenergan [promethazine hcl]; Stadol [butorphanol]; and Erythromycin   Review of Systems Review of Systems  Unable to perform ROS: Mental status change     Physical Exam Updated Vital Signs BP 123/57   Pulse 71   Temp (!) 96 F (35.6 C) (Rectal)   Resp 17   Wt 166 lb (75.3 kg)   LMP 11/21/2007 (Approximate) Comment: No period since 2009,due to"DVT" per pt since  they stopped BCP...no periods  SpO2 95%   BMI 34.69 kg/m   Physical Exam  Constitutional: She appears well-developed.  HENT:  Slight reddened area above left eye. Reportedly happened when she was lighting a cigarette.  Eyes:  Patient is reportedly blind at baseline.  Neck: Neck supple.  Cardiovascular: Normal rate.   Pulmonary/Chest: Effort normal.  Abdominal: There is no tenderness.  Musculoskeletal:  Dressing over dialysis site on right upper arm. Good pulse.  Neurological:  Basically nonverbal. Will moan to sternal rub. We'll get a few words out with pain stimulation but otherwise minimally responsive.  Skin: Capillary refill takes less  than 2 seconds.     ED Treatments / Results  Labs (all labs ordered are listed, but only abnormal results are displayed) Labs Reviewed  BASIC METABOLIC PANEL - Abnormal; Notable for the following:       Result Value   Sodium 134 (*)    CO2 18 (*)    BUN 65 (*)    Creatinine, Ser 6.89 (*)    Calcium 7.1 (*)    GFR calc non Af Amer 7 (*)    GFR calc Af Amer 8 (*)    All other components within normal limits  HEPATIC FUNCTION PANEL - Abnormal; Notable for the following:    Total Protein 5.6 (*)    Albumin 3.1 (*)    AST 83 (*)    Alkaline Phosphatase 354 (*)    Total Bilirubin 1.4 (*)    Bilirubin, Direct 0.6 (*)    All other components within normal limits  AMMONIA - Abnormal; Notable for the following:    Ammonia 46 (*)    All other components within normal limits  CBC WITH DIFFERENTIAL/PLATELET - Abnormal; Notable for the following:    RBC 2.43 (*)    Hemoglobin 7.4 (*)    HCT 23.8 (*)    RDW 19.8 (*)    Neutro Abs 8.9 (*)    Lymphs Abs 0.5 (*)    All other components within normal limits  CBG MONITORING, ED - Abnormal; Notable for the following:    Glucose-Capillary 188 (*)    All other components within normal limits  CBG MONITORING, ED - Abnormal; Notable for the following:    Glucose-Capillary 120 (*)    All other  components within normal limits  CBG MONITORING, ED - Abnormal; Notable for the following:    Glucose-Capillary 38 (*)    All other components within normal limits  CBG MONITORING, ED - Abnormal; Notable for the following:    Glucose-Capillary 55 (*)    All other components within normal limits  CBC WITH DIFFERENTIAL/PLATELET  CBG MONITORING, ED  CBG MONITORING, ED    EKG  EKG Interpretation  Date/Time:  Monday June 27 2016 13:55:14 EDT Ventricular Rate:  98 PR Interval:    QRS Duration: 189 QT Interval:  376 QTC Calculation: 478 R Axis:   131 Text Interpretation:  Right and left arm electrode reversal, interpretation assumes no reversal Sinus rhythm Atrial premature complex Short PR interval Right atrial enlargement Nonspecific intraventricular conduction delay Abnormal T, consider ischemia, lateral leads Artifact in lead(s) I II aVR aVL severe artifact Confirmed by Rubin Payor  MD, Ayen Viviano [phone]) on 06/27/2016 3:37:12 PM       Radiology Ct Head Wo Contrast  Result Date: 06/27/2016 CLINICAL DATA:  Altered mental status.  Hyperglycemia. EXAM: CT HEAD WITHOUT CONTRAST TECHNIQUE: Contiguous axial images were obtained from the base of the skull through the vertex without intravenous contrast. COMPARISON:  03/04/2016 FINDINGS: Brain: Stable focal right frontal encephalomalacia. Empty or partially empty sella. Mild cerebral atrophy for age. Otherwise, the brainstem, cerebellum, cerebral peduncles, thalami, basal ganglia, basilar cisterns, and ventricular system appear within normal limits. No intracranial hemorrhage, mass lesion, or acute CVA. Vascular: Unremarkable Skull: Right frontal calvarial thinning, likely related to remote surgery. This is unchanged from prior exams. Sinuses/Orbits: Unremarkable Other: No supplemental non-categorized findings. IMPRESSION: 1. Stable appearance of the brain with right frontal encephalomalacia underlying the region of calvarial thinning like alert  related to remote prior surgery. No acute intracranial findings. 2. Mild cerebral atrophy for age.  3. Mildly prominent fluid density sella compatible with empty or partially empty sella. This is likewise chronically stable. Electronically Signed   By: Gaylyn Rong M.D.   On: 06/27/2016 16:47   Dg Chest Portable 1 View  Result Date: 06/27/2016 CLINICAL DATA:  Hypoglycemia.  Altered mental status.  Smoker. EXAM: PORTABLE CHEST 1 VIEW COMPARISON:  CT 06/04/2016.  Radiographs 05/19/2016. FINDINGS: 1416 hours. Stable mild cardiomegaly. The mediastinal contours are unremarkable. The lungs are clear. There is no pleural effusion or pneumothorax. No acute osseous findings are evident. IMPRESSION: No active cardiopulmonary process.  Stable mild cardiomegaly. Electronically Signed   By: Carey Bullocks M.D.   On: 06/27/2016 14:32    Procedures Procedures (including critical care time)  Medications Ordered in ED Medications  dextrose 50 % solution 50 mL (50 mLs Intravenous Given 06/27/16 1559)  dextrose 50 % solution 50 mL (50 mLs Intravenous Given 06/27/16 1517)  dextrose 5 % solution (75 mLs Intravenous New Bag/Given 06/27/16 1529)  naloxone (NARCAN) injection 0.2 mg (0.2 mg Intravenous Given 06/27/16 1639)     Initial Impression / Assessment and Plan / ED Course  I have reviewed the triage vital signs and the nursing notes.  Pertinent labs & imaging results that were available during my care of the patient were reviewed by me and considered in my medical decision making (see chart for details).  Clinical Course  Patient with hypoglycemia. Required repeat   pulses of D50 and then D5 drip. Mental status remained low. CT scan done reassuring. Patient had Narcan given also which improved her mental status a little bit. Will admit to hospitalist to stepdown bed.   Final Clinical Impressions(s) / ED Diagnoses   Final diagnoses:  Hypoglycemia  End stage renal disease on dialysis [facility])  CRITICAL  CARE Performed by: Billee Cashing Total critical care time: 30 minutes Critical care time was exclusive of separately billable procedures and treating other patients. Critical care was necessary to treat or prevent imminent or life-threatening deterioration. Critical care was time spent personally by me on the following activities: development of treatment plan with patient and/or surrogate as well as nursing, discussions with consultants, evaluation of patient's response to treatment, examination of patient, obtaining history from patient or surrogate, ordering and performing treatments and interventions, ordering and review of laboratory studies, ordering and review of radiographic studies, pulse oximetry and re-evaluation of patient's condition.   New Prescriptions New Prescriptions   No medications on file     Benjiman Core, MD 06/27/16 1810

## 2016-06-27 NOTE — ED Notes (Signed)
CBG 38. RN in room.

## 2016-06-27 NOTE — ED Notes (Signed)
RN from [facility] care called states pt is non complaint, pt will only stay on dialysis one hour. Pt was alert outside smoking today. Pt only seems confused when she does not get her way. They feel they can not handle patient at their facility any longer.  State pt needs dialysis 3 days in a row to decrease fluid.

## 2016-06-27 NOTE — ED Notes (Signed)
CBG of 38 taken at 1350 not 1440

## 2016-06-28 ENCOUNTER — Encounter (HOSPITAL_COMMUNITY): Payer: Self-pay | Admitting: Primary Care

## 2016-06-28 DIAGNOSIS — Z515 Encounter for palliative care: Secondary | ICD-10-CM

## 2016-06-28 DIAGNOSIS — Z7189 Other specified counseling: Secondary | ICD-10-CM

## 2016-06-28 LAB — GLUCOSE, CAPILLARY
GLUCOSE-CAPILLARY: 109 mg/dL — AB (ref 65–99)
GLUCOSE-CAPILLARY: 120 mg/dL — AB (ref 65–99)
GLUCOSE-CAPILLARY: 149 mg/dL — AB (ref 65–99)
GLUCOSE-CAPILLARY: 153 mg/dL — AB (ref 65–99)
GLUCOSE-CAPILLARY: 163 mg/dL — AB (ref 65–99)
Glucose-Capillary: 92 mg/dL (ref 65–99)
Glucose-Capillary: 101 mg/dL — ABNORMAL HIGH (ref 65–99)
Glucose-Capillary: 84 mg/dL (ref 65–99)

## 2016-06-28 LAB — CBC
HCT: 28 % — ABNORMAL LOW (ref 36.0–46.0)
Hemoglobin: 8.9 g/dL — ABNORMAL LOW (ref 12.0–15.0)
MCH: 30.9 pg (ref 26.0–34.0)
MCHC: 31.8 g/dL (ref 30.0–36.0)
MCV: 97.2 fL (ref 78.0–100.0)
PLATELETS: 176 10*3/uL (ref 150–400)
RBC: 2.88 MIL/uL — AB (ref 3.87–5.11)
RDW: 19.7 % — AB (ref 11.5–15.5)
WBC: 9.4 10*3/uL (ref 4.0–10.5)

## 2016-06-28 LAB — BASIC METABOLIC PANEL
Anion gap: 13 (ref 5–15)
BUN: 65 mg/dL — AB (ref 6–20)
CALCIUM: 7.4 mg/dL — AB (ref 8.9–10.3)
CHLORIDE: 109 mmol/L (ref 101–111)
CO2: 18 mmol/L — AB (ref 22–32)
CREATININE: 6.78 mg/dL — AB (ref 0.44–1.00)
GFR calc non Af Amer: 7 mL/min — ABNORMAL LOW (ref >60)
GFR, EST AFRICAN AMERICAN: 8 mL/min — AB (ref >60)
Glucose, Bld: 95 mg/dL (ref 65–99)
Potassium: 4.7 mmol/L (ref 3.5–5.1)
Sodium: 140 mmol/L (ref 135–145)

## 2016-06-28 LAB — MRSA PCR SCREENING: MRSA BY PCR: NEGATIVE

## 2016-06-28 MED ORDER — GABAPENTIN 400 MG PO CAPS
400.0000 mg | ORAL_CAPSULE | Freq: Every day | ORAL | Status: DC
  Administered 2016-06-28 – 2016-06-29 (×2): 400 mg via ORAL
  Filled 2016-06-28 (×2): qty 1

## 2016-06-28 MED ORDER — HYDROCORTISONE 5 MG PO TABS
5.0000 mg | ORAL_TABLET | Freq: Every day | ORAL | Status: DC
  Administered 2016-06-28 – 2016-06-29 (×2): 5 mg via ORAL
  Filled 2016-06-28 (×3): qty 1

## 2016-06-28 MED ORDER — TOPIRAMATE 25 MG PO TABS
250.0000 mg | ORAL_TABLET | Freq: Two times a day (BID) | ORAL | Status: DC
  Administered 2016-06-28 – 2016-06-30 (×5): 250 mg via ORAL
  Filled 2016-06-28 (×9): qty 2

## 2016-06-28 MED ORDER — SEVELAMER CARBONATE 800 MG PO TABS
2400.0000 mg | ORAL_TABLET | Freq: Two times a day (BID) | ORAL | Status: DC
  Administered 2016-06-28 – 2016-06-30 (×4): 2400 mg via ORAL
  Filled 2016-06-28 (×4): qty 3

## 2016-06-28 MED ORDER — LIDOCAINE HCL (PF) 1 % IJ SOLN: 5.0000 mL | INTRAMUSCULAR | Status: DC | PRN

## 2016-06-28 MED ORDER — DEXTROSE-NACL 5-0.45 % IV SOLN
INTRAVENOUS | Status: DC
  Administered 2016-06-28: 01:00:00 via INTRAVENOUS

## 2016-06-28 MED ORDER — PANTOPRAZOLE SODIUM 40 MG PO TBEC
40.0000 mg | DELAYED_RELEASE_TABLET | Freq: Every day | ORAL | Status: DC
  Administered 2016-06-28 – 2016-06-30 (×3): 40 mg via ORAL
  Filled 2016-06-28 (×3): qty 1

## 2016-06-28 MED ORDER — PRAVASTATIN SODIUM 10 MG PO TABS
20.0000 mg | ORAL_TABLET | Freq: Every day | ORAL | Status: DC
  Administered 2016-06-28: 20 mg via ORAL
  Filled 2016-06-28: qty 2

## 2016-06-28 MED ORDER — EPOETIN ALFA 10000 UNIT/ML IJ SOLN
10000.0000 [IU] | INTRAMUSCULAR | Status: DC
  Administered 2016-06-28 – 2016-06-29 (×2): 10000 [IU] via INTRAVENOUS

## 2016-06-28 MED ORDER — SERTRALINE HCL 50 MG PO TABS
50.0000 mg | ORAL_TABLET | Freq: Every day | ORAL | Status: DC
  Administered 2016-06-28 – 2016-06-30 (×3): 50 mg via ORAL
  Filled 2016-06-28 (×3): qty 1

## 2016-06-28 MED ORDER — SODIUM CHLORIDE 0.9 % IV SOLN: 100.0000 mL | INTRAVENOUS | Status: DC | PRN

## 2016-06-28 MED ORDER — POLYETHYLENE GLYCOL 3350 17 G PO PACK
17.0000 g | PACK | Freq: Every day | ORAL | Status: DC
  Administered 2016-06-28 – 2016-06-30 (×2): 17 g via ORAL
  Filled 2016-06-28 (×2): qty 1

## 2016-06-28 MED ORDER — TOPIRAMATE 100 MG PO TABS: 200.0000 mg | ORAL_TABLET | Freq: Two times a day (BID) | ORAL | Status: DC

## 2016-06-28 MED ORDER — NICOTINE 21 MG/24HR TD PT24
21.0000 mg | MEDICATED_PATCH | Freq: Every day | TRANSDERMAL | Status: DC
  Administered 2016-06-28 – 2016-06-30 (×3): 21 mg via TRANSDERMAL
  Filled 2016-06-28 (×3): qty 1

## 2016-06-28 MED ORDER — FLUDROCORTISONE ACETATE 0.1 MG PO TABS
0.1000 mg | ORAL_TABLET | Freq: Every day | ORAL | Status: DC
  Administered 2016-06-28 – 2016-06-30 (×3): 0.1 mg via ORAL
  Filled 2016-06-28 (×4): qty 1

## 2016-06-28 MED ORDER — TOPIRAMATE 25 MG PO TABS
250.0000 mg | ORAL_TABLET | Freq: Two times a day (BID) | ORAL | Status: DC
  Filled 2016-06-28 (×3): qty 10

## 2016-06-28 MED ORDER — ESTROGENS, CONJUGATED 0.625 MG/GM VA CREA
1.0000 | TOPICAL_CREAM | VAGINAL | Status: DC
  Filled 2016-06-28: qty 30

## 2016-06-28 MED ORDER — PENTAFLUOROPROP-TETRAFLUOROETH EX AERO: 1.0000 "application " | INHALATION_SPRAY | CUTANEOUS | Status: DC | PRN

## 2016-06-28 MED ORDER — LIDOCAINE-PRILOCAINE 2.5-2.5 % EX CREA: 1.0000 "application " | TOPICAL_CREAM | CUTANEOUS | Status: DC | PRN

## 2016-06-28 MED ORDER — HYDROCORTISONE 10 MG PO TABS
10.0000 mg | ORAL_TABLET | Freq: Every day | ORAL | Status: DC
  Administered 2016-06-28 – 2016-06-30 (×3): 10 mg via ORAL
  Filled 2016-06-28 (×4): qty 2

## 2016-06-28 NOTE — Clinical Social Work Note (Signed)
Clinical Social Work Assessment  Patient Details  Name: Erin [name] MRN: [id number] Date of Birth: [birth year]  Date of referral:  06/28/16               Reason for consult:  Other (Comment Required) (From [facility])                Permission sought to share information with:    Permission granted to share information::     Name::        Agency::     Relationship::     Contact Information:     Housing/Transportation Living arrangements for the past 2 months:   ([facility]) Source of Information:  Patient, Facility Patient Interpreter Needed:  None Criminal Activity/Legal Involvement Pertinent to Current Situation/Hospitalization:  No - Comment as needed Significant Relationships:  Church Lives with:  Facility Resident Do you feel safe going back to the place where you live?  Yes Need for family participation in patient care:  Yes (Comment)  Care giving concerns: Facility resident.    Social Worker assessment / plan: Maren Reamer, facility RN, advised that patient is non compliant, has been in the facility 2-3 months, is blind, ambulates independently (although she should use a cane) and completes ADLs independently.  She stated that patient has support from her church family. Patient calls 911 several times per week and recently smeared feces on the bathroom and urinated in the floor.  She stated that patient is in the [facility] program and they are attempting to find her housing in an independent apartment. Ms. Vear Clock stated that she was giving patient a 30 day notice due to her continued behavioral difficulties.  Patient confirmed Wanda's statements. She admitted to non compliant behaviors.  She committed to displaying improved behavioral patterns in an effort to stop being put out of places.  Patient advised that she is on Dialysis on M,W,F at [facility] in [place].  She stated that she would prefer to discharge to a different facility if possible.   Employment status:   Disabled (Comment on whether or not currently receiving Disability) Insurance information:  Medicare PT Recommendations:  Not assessed at this time Information / Referral to community resources:     Patient/Family's Response to care:  Patient is agreeable to remain in placement but desires a different placement.   Patient/Family's Understanding of and Emotional Response to Diagnosis, Current Treatment, and Prognosis:  Patient understands her diagnosis, treatment and prognosis.   Emotional Assessment Appearance:  Appears stated age Attitude/Demeanor/Rapport:   (Cooperative) Affect (typically observed):  Accepting Orientation:  Oriented to Self, Oriented to Place, Oriented to  Time, Oriented to Situation Alcohol / Substance use:  Tobacco Use Psych involvement (Current and /or in the community):  No (Comment)  Discharge Needs  Concerns to be addressed:  Discharge Planning Concerns Readmission within the last 30 days:  No Current discharge risk:  None Barriers to Discharge:  No Barriers Identified   Annice Needy, LCSW 06/28/2016, 2:12 PM

## 2016-06-28 NOTE — Consult Note (Signed)
Reason for Consult: End-stage renal disease Referring Physician: Dr. Loni Beckwith Lampkins is an 45 y.o. female.  HPI: She is a patient who has history of CVA, DVT, hypothyroidism, history of end-stage renal disease on maintenance hemodialysis presently came to the hospital because of altered mental status. When she was evaluated patient was found to have significant hypoglycemia since admitted to the hospital. Presently patient is alert and answering questions. At this moment she denies any nausea or vomiting. She denies also any difficulty in breathing. According to the patient her last dialysis was on Friday.  Past Medical History:  Diagnosis Date  . Anxiety   . Asthma   . Bipolar affect, depressed ([place])   . Blind   . Chronic headaches    Seeing Pain Management  . Constipation   . Depression   . Epilepsy ([place])   . Fibromyalgia   . History of CVA (cerebrovascular accident)   . History of DVT (deep vein thrombosis)   . Hyperlipidemia   . Hypothyroid   . Kidney failure    Currently on Dialysis  . Narcolepsy   . Psychosis     Past Surgical History:  Procedure Laterality Date  . BRAIN SURGERY  1976  . BREAST LUMPECTOMY  2001  . CHOLECYSTECTOMY  2001  . DG AV DIALYSIS GRAFT DECLOT OR     X 4  . GIVENS CAPSULE STUDY N/A 02/29/2016   Procedure: GIVENS CAPSULE STUDY;  Surgeon: Josefine Class, MD;  Location[facility] ENDOSCOPY;  Service: Endoscopy;  Laterality: N/A;  . PERIPHERAL VASCULAR CATHETERIZATION N/A 08/21/2015   Procedure: Dialysis/Perma Catheter Insertion;  Surgeon: Katha Cabal, MD;  Location: [place] CV LAB;  Service: Cardiovascular;  Laterality: N/A;  . PERIPHERAL VASCULAR CATHETERIZATION N/A 02/29/2016   Procedure: Dialysis/Perma Catheter Removal;  Surgeon: Algernon Huxley, MD;  Location: [place] CV LAB;  Service: Cardiovascular;  Laterality: N/A;  . PITUITARY EXCISION  1976    Family History  Problem Relation Age of Onset  . Heart attack Mother   .  Migraines Mother   . Aneurysm Mother   . Diabetes Father     Social History:  reports that she has been smoking Cigarettes.  She has a 8.00 pack-year smoking history. She has never used smokeless tobacco. She reports that she does not drink alcohol or use drugs.  Allergies:  Allergies  Allergen Reactions  . Dhea [Nutritional Supplements] Anaphylaxis    Patient states medication is DHE for migraines, not DHEA  . Demerol [Meperidine] Other (See Comments)    Reaction:  Hallucinations   . Floxin [Ofloxacin] Other (See Comments)    Reaction:  Hallucinations   . Nsaids Nausea And Vomiting and Swelling  . Nubain [Nalbuphine Hcl] Other (See Comments)    Reaction:  Hallucinations   . Phenergan [Promethazine Hcl] Other (See Comments)    Reaction:  Restless legs   . Stadol [Butorphanol] Other (See Comments)    Reaction:  Hallucinations   . Erythromycin Diarrhea and Rash    Medications: I have reviewed the patient's current medications.  Results for orders placed or performed during the hospital encounter of 06/27/16 (from the past 48 hour(s))  CBG monitoring, ED     Status: Abnormal   Collection Time: 06/27/16  1:53 PM  Result Value Ref Range   Glucose-Capillary 38 (LL) 65 - 99 mg/dL  CBG monitoring, ED     Status: Abnormal   Collection Time: 06/27/16  2:08 PM  Result Value Ref Range  Glucose-Capillary 188 (H) 65 - 99 mg/dL   Comment 1 Notify RN   Basic metabolic panel     Status: Abnormal   Collection Time: 06/27/16  2:20 PM  Result Value Ref Range   Sodium 134 (L) 135 - 145 mmol/L   Potassium 3.9 3.5 - 5.1 mmol/L   Chloride 108 101 - 111 mmol/L   CO2 18 (L) 22 - 32 mmol/L   Glucose, Bld 91 65 - 99 mg/dL   BUN 65 (H) 6 - 20 mg/dL   Creatinine, Ser 6.89 (H) 0.44 - 1.00 mg/dL   Calcium 7.1 (L) 8.9 - 10.3 mg/dL   GFR calc non Af Amer 7 (L) >60 mL/min   GFR calc Af Amer 8 (L) >60 mL/min    Comment: (NOTE) The eGFR has been calculated using the CKD EPI equation. This  calculation has not been validated in all clinical situations. eGFR's persistently <60 mL/min signify possible Chronic Kidney Disease.    Anion gap 8 5 - 15  Hepatic function panel     Status: Abnormal   Collection Time: 06/27/16  2:20 PM  Result Value Ref Range   Total Protein 5.6 (L) 6.5 - 8.1 g/dL   Albumin 3.1 (L) 3.5 - 5.0 g/dL   AST 83 (H) 15 - 41 U/L   ALT 30 14 - 54 U/L   Alkaline Phosphatase 354 (H) 38 - 126 U/L   Total Bilirubin 1.4 (H) 0.3 - 1.2 mg/dL   Bilirubin, Direct 0.6 (H) 0.1 - 0.5 mg/dL   Indirect Bilirubin 0.8 0.3 - 0.9 mg/dL  CBG monitoring, ED     Status: Abnormal   Collection Time: 06/27/16  2:24 PM  Result Value Ref Range   Glucose-Capillary 120 (H) 65 - 99 mg/dL  CBG monitoring, ED     Status: Abnormal   Collection Time: 06/27/16  3:12 PM  Result Value Ref Range   Glucose-Capillary 55 (L) 65 - 99 mg/dL  Ammonia     Status: Abnormal   Collection Time: 06/27/16  4:02 PM  Result Value Ref Range   Ammonia 46 (H) 9 - 35 umol/L  CBC with Differential/Platelet     Status: Abnormal   Collection Time: 06/27/16  4:02 PM  Result Value Ref Range   WBC 9.8 4.0 - 10.5 K/uL   RBC 2.43 (L) 3.87 - 5.11 MIL/uL   Hemoglobin 7.4 (L) 12.0 - 15.0 g/dL   HCT 23.8 (L) 36.0 - 46.0 %   MCV 97.9 78.0 - 100.0 fL   MCH 30.5 26.0 - 34.0 pg   MCHC 31.1 30.0 - 36.0 g/dL   RDW 19.8 (H) 11.5 - 15.5 %   Platelets 156 150 - 400 K/uL   Neutrophils Relative % 90 %   Neutro Abs 8.9 (H) 1.7 - 7.7 K/uL   Lymphocytes Relative 6 %   Lymphs Abs 0.5 (L) 0.7 - 4.0 K/uL   Monocytes Relative 4 %   Monocytes Absolute 0.3 0.1 - 1.0 K/uL   Eosinophils Relative 0 %   Eosinophils Absolute 0.0 0.0 - 0.7 K/uL   Basophils Relative 0 %   Basophils Absolute 0.0 0.0 - 0.1 K/uL  CBG monitoring, ED     Status: None   Collection Time: 06/27/16  4:03 PM  Result Value Ref Range   Glucose-Capillary 96 65 - 99 mg/dL  POC CBG, ED     Status: None   Collection Time: 06/27/16  6:14 PM  Result Value Ref  Range   Glucose-Capillary  68 65 - 99 mg/dL  MRSA PCR Screening     Status: None   Collection Time: 06/27/16  9:30 PM  Result Value Ref Range   MRSA by PCR NEGATIVE NEGATIVE    Comment:        The GeneXpert MRSA Assay (FDA approved for NASAL specimens only), is one component of a comprehensive MRSA colonization surveillance program. It is not intended to diagnose MRSA infection nor to guide or monitor treatment for MRSA infections.   Glucose, capillary     Status: None   Collection Time: 06/28/16  2:04 AM  Result Value Ref Range   Glucose-Capillary 92 65 - 99 mg/dL   Comment 1 Notify RN   Glucose, capillary     Status: Abnormal   Collection Time: 06/28/16  4:03 AM  Result Value Ref Range   Glucose-Capillary 101 (H) 65 - 99 mg/dL  Basic metabolic panel     Status: Abnormal   Collection Time: 06/28/16  4:21 AM  Result Value Ref Range   Sodium 140 135 - 145 mmol/L   Potassium 4.7 3.5 - 5.1 mmol/L    Comment: DELTA CHECK NOTED   Chloride 109 101 - 111 mmol/L   CO2 18 (L) 22 - 32 mmol/L   Glucose, Bld 95 65 - 99 mg/dL   BUN 65 (H) 6 - 20 mg/dL   Creatinine, Ser 6.78 (H) 0.44 - 1.00 mg/dL   Calcium 7.4 (L) 8.9 - 10.3 mg/dL   GFR calc non Af Amer 7 (L) >60 mL/min   GFR calc Af Amer 8 (L) >60 mL/min    Comment: (NOTE) The eGFR has been calculated using the CKD EPI equation. This calculation has not been validated in all clinical situations. eGFR's persistently <60 mL/min signify possible Chronic Kidney Disease.    Anion gap 13 5 - 15  CBC     Status: Abnormal   Collection Time: 06/28/16  4:21 AM  Result Value Ref Range   WBC 9.4 4.0 - 10.5 K/uL   RBC 2.88 (L) 3.87 - 5.11 MIL/uL   Hemoglobin 8.9 (L) 12.0 - 15.0 g/dL   HCT 28.0 (L) 36.0 - 46.0 %   MCV 97.2 78.0 - 100.0 fL   MCH 30.9 26.0 - 34.0 pg   MCHC 31.8 30.0 - 36.0 g/dL   RDW 19.7 (H) 11.5 - 15.5 %   Platelets 176 150 - 400 K/uL  Glucose, capillary     Status: Abnormal   Collection Time: 06/28/16  7:44 AM   Result Value Ref Range   Glucose-Capillary 109 (H) 65 - 99 mg/dL   Comment 1 Notify RN    Comment 2 Document in Chart     Ct Head Wo Contrast  Result Date: 06/27/2016 CLINICAL DATA:  Altered mental status.  Hyperglycemia. EXAM: CT HEAD WITHOUT CONTRAST TECHNIQUE: Contiguous axial images were obtained from the base of the skull through the vertex without intravenous contrast. COMPARISON:  03/04/2016 FINDINGS: Brain: Stable focal right frontal encephalomalacia. Empty or partially empty sella. Mild cerebral atrophy for age. Otherwise, the brainstem, cerebellum, cerebral peduncles, thalami, basal ganglia, basilar cisterns, and ventricular system appear within normal limits. No intracranial hemorrhage, mass lesion, or acute CVA. Vascular: Unremarkable Skull: Right frontal calvarial thinning, likely related to remote surgery. This is unchanged from prior exams. Sinuses/Orbits: Unremarkable Other: No supplemental non-categorized findings. IMPRESSION: 1. Stable appearance of the brain with right frontal encephalomalacia underlying the region of calvarial thinning like alert related to remote prior surgery. No acute intracranial  findings. 2. Mild cerebral atrophy for age. 3. Mildly prominent fluid density sella compatible with empty or partially empty sella. This is likewise chronically stable. Electronically Signed   By: Van Clines M.D.   On: 06/27/2016 16:47   Dg Chest Portable 1 View  Result Date: 06/27/2016 CLINICAL DATA:  Hypoglycemia.  Altered mental status.  Smoker. EXAM: PORTABLE CHEST 1 VIEW COMPARISON:  CT 06/04/2016.  Radiographs 05/19/2016. FINDINGS: 1416 hours. Stable mild cardiomegaly. The mediastinal contours are unremarkable. The lungs are clear. There is no pleural effusion or pneumothorax. No acute osseous findings are evident. IMPRESSION: No active cardiopulmonary process.  Stable mild cardiomegaly. Electronically Signed   By: Richardean Sale M.D.   On: 06/27/2016 14:32    Review  of Systems  Constitutional: Negative for fever.  Eyes: Negative for blurred vision.  Respiratory: Negative for shortness of breath.   Cardiovascular: Negative for orthopnea and leg swelling.  Gastrointestinal: Negative for nausea and vomiting.  Psychiatric/Behavioral: Positive for depression. The patient is nervous/anxious.    Blood pressure (!) 143/61, pulse 71, temperature 97.8 F (36.6 C), temperature source Oral, resp. rate 12, height 4' 10"  (1.473 m), weight 76.9 kg (169 lb 8.5 oz), last menstrual period 11/21/2007, SpO2 95 %. Physical Exam  Constitutional: She is oriented to person, place, and time. She appears distressed.  Eyes:  Patient bilaterally blind  Neck: No JVD present.  Cardiovascular: Normal rate and regular rhythm.   Respiratory: She has no wheezes. She has no rales.  GI: She exhibits no distension. There is no tenderness.  Musculoskeletal: She exhibits no edema.  Neurological: She is alert and oriented to person, place, and time.    Assessment/Plan: Problem #1 end-stage renal disease: She is status post hemodialysis on Friday. She was supposed to be dialyzed yesterday. Presently patient does not have any uremic signs and symptoms. Her potassium is normal Problem #2 hypoglycemia: Her sugar is better. Patient presently is alert and she also had dialysis unit is. Presently seems to be transferred to [facility] about a couple of days ago for dialysis. Problem #3 history of CVA Problem #4 history of DVT Problem #5 history of brain tumor Problem #6 history of hypothyroidism Problem #7 anemia: Her hemoglobin is below our target goal. Problem #8 metabolic or disease: Her calcium is range. Plan: 1] We'll make arrangements for patient to get dialysis today 2] We'll dialyze her for 3 hours and possibly remove about 2 L if her blood pressure tolerates 3] we'll use Epogen 10,000 units IV after each dialysis 4] we'll check renal panel and CBC in the  morning.  Garrie Elenes S 06/28/2016, 8:47 AM

## 2016-06-28 NOTE — Progress Notes (Signed)
Interdry applied to abdominal and groin folds per wound care orders

## 2016-06-28 NOTE — Consult Note (Signed)
Consultation Note Date: 06/28/2016   Patient Name: Erin [name]  DOB: 05-12-1971  MRN: [id number]  Age / Sex: 45 y.o., female  PCP: [facility] Referring Physician: Erick Blinks, MD  Reason for Consultation: Disposition, Establishing goals of care and Psychosocial/spiritual support  HPI/Patient Profile: 45 y.o. 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   Clinical Assessment and Goals of Care: Ms. Onaga is resting quietly in bed. She is alert and oriented times 3, but has periods of slowness in response. We talk about her chronic health conditions, and her acute problem during this hospitalization.  We talk about her support system. She states she has a brother in [place], and a female cousin in [place]. We talk about her being in the ED 8 times in the last 6 months. We talk about the benefits of guardianship, but she states she does not want her guardian. She tells of friends who had guardians, they were not happy, but she is unable to give any specific examples. I share my experience with guardianship in a more positive manner and encouraged her to consider these choices. I share my worry over her being unresponsive on being brought to the hospital and having no one to speak for her. We talk about the realities of CPR, and I encouraged her to consider choices. I share that she will have dialysis later this afternoon. No concerns or questions at this time.  Health care POA OTHER - None at this time, Brother lives in [place]. Cousin in [place]?   SUMMARY OF RECOMMENDATIONS   Continue to treat the treatable. Continue with HD. We discuss HCPOA, Code status,  and the benefits of having a guardian.   Code Status/Advance Care Planning:  Full code -  We discuss code status choices and expected declines.   Symptom Management:   Per hospitalist   Palliative Prophylaxis:   Frequent Pain Assessment and Palliative Wound Care  Additional Recommendations (Limitations, Scope, Preferences):  Full Scope Treatment  Psycho-social/Spiritual:   Desire for further Chaplaincy support:no  Additional Recommendations: none at this time.   Prognosis:   Unable to determine, based on outcomes.   Discharge Planning: likely return to a group home.       Primary Diagnoses: Present on Admission: . Hypoglycemia . Anemia . Bipolar 1 disorder, mixed, moderate (HCC) . Fungal rash of trunk . Blindness . Altered mental status . Chronic pain   I have reviewed the medical record, interviewed the patient and family, and examined the patient. The following aspects are pertinent.  Past Medical History:  Diagnosis Date  . Anxiety   . Asthma   . Bipolar affect, depressed (HCC)   . Blind   . Chronic headaches    Seeing Pain Management  . Constipation   . Depression   . Epilepsy (HCC)   . Fibromyalgia   .  History of CVA (cerebrovascular accident)   . History of DVT (deep vein thrombosis)   . Hyperlipidemia   . Hypothyroid   . Kidney failure    Currently on Dialysis  . Narcolepsy   . Psychosis    Social History   Social History  . Marital status: Divorced    Spouse name: N/A  . Number of children: N/A  . Years of education: N/A   Social History Main Topics  . Smoking status: Current Every Day Smoker    Packs/day: 0.50    Years: 16.00    Types: Cigarettes  . Smokeless tobacco: Never Used  . Alcohol use No     Comment: occasionally  . Drug use: No  . Sexual activity: Yes   Other Topics Concern  . None   Social History Narrative  . None   Family History  Problem Relation Age of Onset  . Heart attack Mother   . Migraines  Mother   . Aneurysm Mother   . Diabetes Father    Scheduled Meds: . [START ON 06/30/2016] conjugated estrogens  1 Applicatorful Vaginal Once per day on Mon Thu  . enoxaparin (LOVENOX) injection  30 mg Subcutaneous Q24H  . epoetin (EPOGEN/PROCRIT) injection  10,000 Units Intravenous Q T,Th,Sa-HD  . fludrocortisone  0.1 mg Oral Daily  . gabapentin  400 mg Oral QHS  . hydrocortisone  10 mg Oral Daily  . hydrocortisone  5 mg Oral QHS  . levothyroxine  112 mcg Oral QAC breakfast  . nystatin cream   Topical BID  . pantoprazole  40 mg Oral Daily  . polyethylene glycol  17 g Oral Daily  . pravastatin  20 mg Oral q1800  . risperiDONE  2 mg Oral QHS  . risperidone  4 mg Oral q morning - 10a  . sertraline  50 mg Oral Daily  . sevelamer carbonate  2,400 mg Oral BID WC  . topiramate  250 mg Oral BID   Continuous Infusions:  PRN Meds:.sodium chloride, sodium chloride, sodium chloride, acetaminophen **OR** acetaminophen, lidocaine (PF), lidocaine-prilocaine, pentafluoroprop-tetrafluoroeth, sodium chloride flush Medications Prior to Admission:  Prior to Admission medications   Medication Sig Start Date End Date Taking? Authorizing Provider  estradiol (ESTRACE) 0.1 MG/GM vaginal cream Place 1 Applicatorful vaginally 2 (two) times a week. Mondays and Thursdays   Yes Historical Provider, MD  fludrocortisone (FLORINEF) 0.1 MG tablet Take 0.1 mg by mouth daily.   Yes Historical Provider, MD  furosemide (LASIX) 40 MG tablet Take 40 mg by mouth 2 (two) times daily.   Yes Historical Provider, MD  gabapentin (NEURONTIN) 100 MG capsule Take 400 mg by mouth at bedtime.   Yes Historical Provider, MD  hydrocortisone (CORTEF) 10 MG tablet Take 5-10 mg by mouth 2 (two) times daily. Pt takes 10 mg in the morning and 5 mg at bedtime.   Yes Historical Provider, MD  HYDROmorphone (DILAUDID) 4 MG tablet Take 1.5 tablets (6 mg total) by mouth every 6 (six) hours as needed for severe pain. Patient taking differently:  Take 6 mg by mouth every 8 (eight) hours as needed for moderate pain or severe pain.  03/07/16  Yes [name], MD  levothyroxine (SYNTHROID, LEVOTHROID) 112 MCG tablet Take 112 mcg by mouth daily.    Yes Historical Provider, MD  loratadine (CLARITIN) 10 MG tablet Take 10 mg by mouth daily.   Yes Historical Provider, MD  lovastatin (MEVACOR) 40 MG tablet Take 20 mg by mouth every evening.    Yes  Historical Provider, MD  Melatonin 5 MG TABS Take 2 tablets by mouth at bedtime.   Yes Historical Provider, MD  pantoprazole (PROTONIX) 40 MG tablet Take 1 tablet (40 mg total) by mouth daily. 02/27/16  Yes [name], MD  risperidone (RISPERDAL) 4 MG tablet Take 2-4 mg by mouth 2 (two) times daily. Pt takes 4 mg in the morning and 2 mg at bedtime.   Yes Historical Provider, MD  sertraline (ZOLOFT) 50 MG tablet Take 50 mg by mouth daily.   Yes Historical Provider, MD  sevelamer carbonate (RENVELA) 800 MG tablet Take 2,400 mg by mouth 2 (two) times daily.    Yes Historical Provider, MD  topiramate (TOPAMAX) 200 MG tablet Take 200 mg by mouth 2 (two) times daily. Pt takes with a 50 mg tablet.   Yes Historical Provider, MD  topiramate (TOPAMAX) 50 MG tablet Take 50 mg by mouth 2 (two) times daily. Pt takes with a 200 mg tablet.   Yes Historical Provider, MD  Vitamin D, Ergocalciferol, (DRISDOL) 50000 UNITS CAPS capsule Take 50,000 Units by mouth every 30 (thirty) days. Takes on the 8th of every month   Yes Historical Provider, MD   Allergies  Allergen Reactions  . Dhea [Nutritional Supplements] Anaphylaxis    Patient states medication is DHE for migraines, not DHEA  . Demerol [Meperidine] Other (See Comments)    Reaction:  Hallucinations   . Floxin [Ofloxacin] Other (See Comments)    Reaction:  Hallucinations   . Nsaids Nausea And Vomiting and Swelling  . Nubain [Nalbuphine Hcl] Other (See Comments)    Reaction:  Hallucinations   . Phenergan [Promethazine Hcl] Other (See Comments)    Reaction:   Restless legs   . Stadol [Butorphanol] Other (See Comments)    Reaction:  Hallucinations   . Erythromycin Diarrhea and Rash   Review of Systems  Unable to perform ROS: Mental status change    Physical Exam  Constitutional: No distress.  Chronically ill appearing. NAD  HENT:  Head: Normocephalic and atraumatic.  Eyes:  Blind  Cardiovascular: Normal rate and regular rhythm.   Pulmonary/Chest: Effort normal. No respiratory distress.  Abdominal: Soft.  Obese abdomen  Neurological: She is alert.  Oriented X 3, slow to process at times.   Skin: Skin is warm and dry.    Vital Signs: BP (!) 149/59   Pulse 71   Temp 98.2 F (36.8 C) (Oral)   Resp (!) 21   Ht 4\' 10"  (1.473 m)   Wt 76.9 kg (169 lb 8.5 oz)   LMP 11/21/2007 (Approximate) Comment: No period since 2009,due to"DVT" per pt since they stopped BCP...no periods  SpO2 95%   BMI 35.43 kg/m  Pain Assessment: 0-10   Pain Score: Asleep   SpO2: SpO2: 95 % O2 Device:SpO2: 95 % O2 Flow Rate: .   IO: Intake/output summary:  Intake/Output Summary (Last 24 hours) at 06/28/16 1411 Last data filed at 06/28/16 1217  Gross per 24 hour  Intake              855 ml  Output              875 ml  Net              -20 ml    LBM: Last BM Date:  (Pt unable to state when last BM was) Baseline Weight: Weight: 75.3 kg (166 lb) Most recent weight: Weight: 76.9 kg (169 lb 8.5 oz)     Palliative  Assessment/Data:   Flowsheet Rows   Flowsheet Row Most Recent Value  Intake Tab  Referral Department  Hospitalist  Unit at Time of Referral  ICU  Palliative Care Primary Diagnosis  Nephrology  Date Notified  06/28/16  Palliative Care Type  New Palliative care  Reason for referral  Clarify Goals of Care, Advance Care Planning  Date of Admission  06/27/16  Date first seen by Palliative Care  06/28/16  # of days Palliative referral response time  0 Day(s)  # of days IP prior to Palliative referral  1  Clinical Assessment  Palliative  Performance Scale Score  40%  Pain Max last 24 hours  Not able to report  Pain Min Last 24 hours  Not able to report  Dyspnea Max Last 24 Hours  Not able to report  Dyspnea Min Last 24 hours  Not able to report  Psychosocial & Spiritual Assessment  Palliative Care Outcomes  Patient/Family meeting held?  Yes  Who was at the meeting?  patient only today  Palliative Care Outcomes  Clarified goals of care, Provided psychosocial or spiritual support  Palliative Care follow-up planned  -- [follow up while at [facility]]      Time In: 1005 Time Out: 1055 Time Total: 50 minutes Greater than 50%  of this time was spent counseling and coordinating care related to the above assessment and plan.  Signed by: Katheran Aweasha A Jemario Poitras, NP   Please contact Palliative Medicine Team phone at [phone] for questions and concerns.  For individual provider: See Loretha StaplerAmion

## 2016-06-28 NOTE — Procedures (Signed)
   HEMODIALYSIS TREATMENT NOTE:  Pt restless and anxious during HD session; repeatedly repositioning, bearing weight on upper extremities, palpating AVG during treatment ("I like to feel the tape."). HIGH risk for needle infiltration.  Emotionally unstable: resting and singing one minute, tearful and anxious the next. We passed the time singing songs, telling stories but she became anxious when unable to recall particular details. Unable to complete 3 hours of HD as ordered.  Terminated treatment early/AMA with 10 minutes remaining.  Goal met: 2.1 liters removed without interruption in ultrafiltration.  All blood was returned and hemostasis was achieved within 15 minutes. Report given to Janett Billow, Therapist, sports.  Rockwell Alexandria, RN, CDN

## 2016-06-28 NOTE — Progress Notes (Signed)
PROGRESS NOTE    Erin [name]  NGE:[id number] DOB: 17-May-1971 DOA: 06/27/2016 PCP: [facility]    Brief Narrative:  45 yof with a past hx of anemia, ESRD on HD, Seizures, GERD, and hypothyroidism presented by the EMS with complaints of altered mental status. While in the ED she was noted to have several abnormal lab findings including hypoglycemia, anemia. CT head showed stable appearance of the brain with right frontal encephalomalacia underlying the region of calvarial thinking related to prior surgery. No acute intracranial findings. She received a dose of narcan with some improvement in mental status. She was admitted for further treatments.   Assessment & Plan:   Principal Problem:   Hypoglycemia Active Problems:   Anemia   Bipolar 1 disorder, mixed, moderate (HCC)   ESRD on dialysis (HCC)   Fungal rash of trunk   Seizure disorder (HCC)   Blindness   Altered mental status   Chronic pain   History of CVA (cerebrovascular accident) 1. Hypoglycemia. Likely due to malnutrition/ESRD. She was not taking any diabetic medications prior to admission. She has been started on dextrose infusion with improvement of blood sugars. Since she is now more alert and eating, will discontinue dextrose and continue to follow blood sugars. 2. Acute metabolic encephalopathy. Likely from hypoglycemia. Patient was also taking dilaudid as an outpatient and had some response to narcan. Overall mental status is improved. Continue to monitor..  3. ESRD with HD. She missed HD yesterday. Nephrology following and plans on dialysis today  4.  Anemia. Hgb 8.9, she is at baseline. Continue to follow.  5. Hypothyroidism. Continue synthroid.  6. 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 Continue hormonal replacement therapies. She is currently on intravenous hydrocortisone. Will transition back to oral hydrocortisone since she is clinically improving.  7. Sz disorder. Has not had  any seizures since admission. She does have a hx of CVA in 2002. Continue topamax. 8. GERD. Continue PPI.  9. Bipolar/depression. Continue outpatient medication.   10. Chronic pain. On multiple pain medications prior to admission. Received narcan in ED with some response in mental status. Continue to hold pain medicines for now 11. Fungal rash. She has a rash on her lower mid abdomen. Wound care was consulted and placed an interdry silver-impregnated fabric which should remain in place for 5 days.  12. Discussion. Patient has had a decline since her mother and primary caretaker died earlier this year. She is currently in a family care home since may 2017. Appears to be chronically ill and has not been tolerating dialysis sessions very well. Usually does not stay on HD machine for >1 hour. Palliative care consulted to help establish goals of care.   DVT prophylaxis: lovenox Code Status: Full Family Communication: No family bedside Disposition Plan: Discharge back to ALF once improved   Consultants:   Palliative care   Wound care  Nephrology   Procedures:   None   Antimicrobials:   None    Subjective: Patient denies any shortness of breath. No chest pain. Feeling better.  Objective: Vitals:   06/28/16 0200 06/28/16 0300 06/28/16 0400 06/28/16 0500  BP: 134/72  131/60   Pulse:      Resp: 19 (!) 0 11 12  Temp:   97 F (36.1 C)   TempSrc:   Axillary   SpO2:      Weight:    76.9 kg (169 lb 8.5 oz)  Height:        Intake/Output  Summary (Last 24 hours) at 06/28/16 0602 Last data filed at 06/28/16 0500  Gross per 24 hour  Intake              225 ml  Output              700 ml  Net             -475 ml   Filed Weights   06/27/16 1357 06/27/16 1950 06/28/16 0500  Weight: 75.3 kg (166 lb) 75.8 kg (167 lb 1.7 oz) 76.9 kg (169 lb 8.5 oz)    Examination:  General exam: Appears calm and comfortable  Respiratory system: Clear to auscultation. Respiratory effort  normal. Cardiovascular system: S1 & S2 heard, RRR. No JVD, murmurs, rubs, gallops or clicks. 1+ pedal edema. Gastrointestinal system: Abdomen is nondistended, soft and nontender. No organomegaly or masses felt. Normal bowel sounds heard. Central nervous system: Alert and oriented. No focal neurological deficits. Extremities: Symmetric 5 x 5 power. Skin: No rashes, lesions or ulcers Psychiatry: Judgement and insight appear normal. Mood & affect appropriate.     Data Reviewed: I have personally reviewed following labs and imaging studies  CBC:  Recent Labs Lab 06/27/16 1602 06/28/16 0421  WBC 9.8 9.4  NEUTROABS 8.9*  --   HGB 7.4* 8.9*  HCT 23.8* 28.0*  MCV 97.9 97.2  PLT 156 176   Basic Metabolic Panel:  Recent Labs Lab 06/27/16 1420  NA 134*  K 3.9  CL 108  CO2 18*  GLUCOSE 91  BUN 65*  CREATININE 6.89*  CALCIUM 7.1*   GFR: Estimated Creatinine Clearance: 9.3 mL/min (by C-G formula based on SCr of 6.89 mg/dL). Liver Function Tests:  Recent Labs Lab 06/27/16 1420  AST 83*  ALT 30  ALKPHOS 354*  BILITOT 1.4*  PROT 5.6*  ALBUMIN 3.1*   No results for input(s): LIPASE, AMYLASE in the last 168 hours.  Recent Labs Lab 06/27/16 1602  AMMONIA 46*   Coagulation Profile: No results for input(s): INR, PROTIME in the last 168 hours. Cardiac Enzymes: No results for input(s): CKTOTAL, CKMB, CKMBINDEX, TROPONINI in the last 168 hours. BNP (last 3 results) No results for input(s): PROBNP in the last 8760 hours. HbA1C: No results for input(s): HGBA1C in the last 72 hours. CBG:  Recent Labs Lab 06/27/16 1512 06/27/16 1603 06/27/16 1814 06/28/16 0204 06/28/16 0403  GLUCAP 55* 96 68 92 101*   Lipid Profile: No results for input(s): CHOL, HDL, LDLCALC, TRIG, CHOLHDL, LDLDIRECT in the last 72 hours. Thyroid Function Tests: No results for input(s): TSH, T4TOTAL, FREET4, T3FREE, THYROIDAB in the last 72 hours. Anemia Panel: No results for input(s):  VITAMINB12, FOLATE, FERRITIN, TIBC, IRON, RETICCTPCT in the last 72 hours. Sepsis Labs: No results for input(s): PROCALCITON, LATICACIDVEN in the last 168 hours.  Recent Results (from the past 240 hour(s))  MRSA PCR Screening     Status: None   Collection Time: 06/27/16  9:30 PM  Result Value Ref Range Status   MRSA by PCR NEGATIVE NEGATIVE Final    Comment:        The GeneXpert MRSA Assay (FDA approved for NASAL specimens only), is one component of a comprehensive MRSA colonization surveillance program. It is not intended to diagnose MRSA infection nor to guide or monitor treatment for MRSA infections.          Radiology Studies: Ct Head Wo Contrast  Result Date: 06/27/2016 CLINICAL DATA:  Altered mental status.  Hyperglycemia. EXAM: CT HEAD WITHOUT  CONTRAST TECHNIQUE: Contiguous axial images were obtained from the base of the skull through the vertex without intravenous contrast. COMPARISON:  03/04/2016 FINDINGS: Brain: Stable focal right frontal encephalomalacia. Empty or partially empty sella. Mild cerebral atrophy for age. Otherwise, the brainstem, cerebellum, cerebral peduncles, thalami, basal ganglia, basilar cisterns, and ventricular system appear within normal limits. No intracranial hemorrhage, mass lesion, or acute CVA. Vascular: Unremarkable Skull: Right frontal calvarial thinning, likely related to remote surgery. This is unchanged from prior exams. Sinuses/Orbits: Unremarkable Other: No supplemental non-categorized findings. IMPRESSION: 1. Stable appearance of the brain with right frontal encephalomalacia underlying the region of calvarial thinning like alert related to remote prior surgery. No acute intracranial findings. 2. Mild cerebral atrophy for age. 3. Mildly prominent fluid density sella compatible with empty or partially empty sella. This is likewise chronically stable. Electronically Signed   By: Gaylyn RongWalter  Liebkemann M.D.   On: 06/27/2016 16:47   Dg Chest Portable  1 View  Result Date: 06/27/2016 CLINICAL DATA:  Hypoglycemia.  Altered mental status.  Smoker. EXAM: PORTABLE CHEST 1 VIEW COMPARISON:  CT 06/04/2016.  Radiographs 05/19/2016. FINDINGS: 1416 hours. Stable mild cardiomegaly. The mediastinal contours are unremarkable. The lungs are clear. There is no pleural effusion or pneumothorax. No acute osseous findings are evident. IMPRESSION: No active cardiopulmonary process.  Stable mild cardiomegaly. Electronically Signed   By: Carey BullocksWilliam  Veazey M.D.   On: 06/27/2016 14:32        Scheduled Meds: . enoxaparin (LOVENOX) injection  30 mg Subcutaneous Q24H  . estradiol  1 Applicatorful Vaginal Once per day on Mon Thu  . hydrocortisone sod succinate (SOLU-CORTEF) inj  25 mg Intravenous Q8H  . levothyroxine  112 mcg Oral QAC breakfast  . nystatin cream   Topical BID  . risperiDONE  2 mg Oral QHS  . risperidone  4 mg Oral q morning - 10a  . topiramate  250 mg Oral BID   Continuous Infusions: . dextrose 5 % and 0.45% NaCl 75 mL/hr at 06/28/16 0100     LOS: 1 day    Time spent: 25 minutes     Erick BlinksJehanzeb Nichola Warren, MD [facility] If 7PM-7AM, please contact night-coverage [email or web address] Password [email or web address] 06/28/2016, 6:02 AM

## 2016-06-28 NOTE — [place] FL2 (Signed)
[place] MEDICAID FL2 LEVEL OF CARE SCREENING TOOL     IDENTIFICATION  Patient Name: Erin [name] Birthdate: 08-07-1971 Sex: female Admission Date (Current Location): 06/27/2016  [place] and [place] Number:  [address] [phone] S Facility and Address:  [facility],  [address] [place] [address]      Provider Number: [id number]  Attending Physician Name and Address:  Erick Blinks, MD  Relative Name and Phone Number:       Current Level of Care: Hospital Recommended Level of Care: [facility], Assisted Living Facility Prior Approval Number:    Date Approved/Denied:   PASRR Number: [id number]  Discharge Plan: Domiciliary (Rest home)    Current Diagnoses: Patient Active Problem List   Diagnosis Date Noted  . Hypoglycemia 06/27/2016  . Fungal rash of trunk 06/27/2016  . Seizure disorder (HCC) 06/27/2016  . Blindness 06/27/2016  . Altered mental status 06/27/2016  . Chronic pain 06/27/2016  . History of CVA (cerebrovascular accident) 06/27/2016  . GI bleed 03/04/2016  . Generalized weakness 03/04/2016  . Hypothyroidism 03/04/2016  . GERD (gastroesophageal reflux disease) 03/04/2016  . ESRD on dialysis (HCC) 03/04/2016  . Bipolar 1 disorder, mixed, moderate (HCC) 02/26/2016  . 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 Anemia 02/25/2016    Orientation RESPIRATION BLADDER Height & Weight     Self, Time, Situation, Place  Normal Continent Weight: 169 lb 8.5 oz (76.9 kg) Height:  4\' 10"  (147.3 cm)  BEHAVIORAL SYMPTOMS/MOOD NEUROLOGICAL BOWEL NUTRITION STATUS      Continent Diet (Diet renal with fluid restriction. Fluid restriction: 1200 mL fluid)  AMBULATORY STATUS COMMUNICATION OF NEEDS Skin   Supervision Verbally Other (Comment) (Abrasion head left; bllister finger middle right; intertriginous dermatitis abdomen and groin)                       Personal Care  Assistance Level of Assistance  Bathing, Feeding, Dressing   Feeding assistance: Independent Dressing Assistance: Independent     Functional Limitations Info  Sight, Hearing, Speech Sight Info: Impaired (Patient is blind) Hearing Info: Adequate Speech Info: Adequate    SPECIAL CARE FACTORS FREQUENCY                       Contractures      Additional Factors Info  Code Status, Allergies, Psychotropic Code Status Info: Full Code Allergies Info: Dhea Nutritional Supplements; Demerol, Floxin, Nsaids, Nubain, Phenergan, Stadol, Erythromycin Psychotropic Info: Neuronting, Risperdal, Zoloft, Topamax         Current Medications (06/28/2016):  This is the current hospital active medication list Current Facility-Administered Medications  Medication Dose Route Frequency Provider Last Rate Last Dose  . 0.9 %  sodium chloride infusion  250 mL Intravenous PRN Delano Metz, MD      . 0.9 %  sodium chloride infusion  100 mL Intravenous PRN Salomon Mast, MD      . 0.9 %  sodium chloride infusion  100 mL Intravenous PRN Salomon Mast, MD      . acetaminophen (TYLENOL) tablet 650 mg  650 mg Oral Q6H PRN Delano Metz, MD   650 mg at 06/28/16 1419   Or  . acetaminophen (TYLENOL) suppository 650 mg  650 mg Rectal Q6H PRN Delano Metz, MD      . Melene Muller ON 06/30/2016] conjugated estrogens (PREMARIN) vaginal cream 1 Applicatorful  1 Applicatorful Vaginal Once per day on Mon Thu Erick Blinks, MD      .  enoxaparin (LOVENOX) injection 30 mg  30 mg Subcutaneous Q24H Delano Metzobert Schertz, MD      . epoetin alfa (EPOGEN,PROCRIT) injection 10,000 Units  10,000 Units Intravenous Q T,Th,Sa-HD Salomon MastBelayenh Befekadu, MD      . fludrocortisone (FLORINEF) tablet 0.1 mg  0.1 mg Oral Daily Erick BlinksJehanzeb Memon, MD   0.1 mg at 06/28/16 0957  . gabapentin (NEURONTIN) capsule 400 mg  400 mg Oral QHS Erick BlinksJehanzeb Memon, MD      . hydrocortisone (CORTEF) tablet 10 mg  10 mg Oral Daily Erick BlinksJehanzeb Memon, MD   10 mg at  06/28/16 0957  . hydrocortisone (CORTEF) tablet 5 mg  5 mg Oral QHS Erick BlinksJehanzeb Memon, MD      . levothyroxine (SYNTHROID, LEVOTHROID) tablet 112 mcg  112 mcg Oral QAC breakfast Delano Metzobert Schertz, MD   112 mcg at 06/28/16 0815  . lidocaine (PF) (XYLOCAINE) 1 % injection 5 mL  5 mL Intradermal PRN Salomon MastBelayenh Befekadu, MD      . lidocaine-prilocaine (EMLA) cream 1 application  1 application Topical PRN Salomon MastBelayenh Befekadu, MD      . nystatin cream (MYCOSTATIN)   Topical BID Delano Metzobert Schertz, MD      . pantoprazole (PROTONIX) EC tablet 40 mg  40 mg Oral Daily Erick BlinksJehanzeb Memon, MD   40 mg at 06/28/16 0958  . pentafluoroprop-tetrafluoroeth (GEBAUERS) aerosol 1 application  1 application Topical PRN Salomon MastBelayenh Befekadu, MD      . polyethylene glycol (MIRALAX / GLYCOLAX) packet 17 g  17 g Oral Daily Erick BlinksJehanzeb Memon, MD   17 g at 06/28/16 1419  . pravastatin (PRAVACHOL) tablet 20 mg  20 mg Oral q1800 Erick BlinksJehanzeb Memon, MD      . risperiDONE (RISPERDAL) tablet 2 mg  2 mg Oral QHS Delano Metzobert Schertz, MD   2 mg at 06/27/16 2111  . risperiDONE (RISPERDAL) tablet 4 mg  4 mg Oral q morning - 10a Delano Metzobert Schertz, MD   4 mg at 06/28/16 0957  . sertraline (ZOLOFT) tablet 50 mg  50 mg Oral Daily Erick BlinksJehanzeb Memon, MD   50 mg at 06/28/16 0958  . sevelamer carbonate (RENVELA) tablet 2,400 mg  2,400 mg Oral BID WC Erick BlinksJehanzeb Memon, MD   2,400 mg at 06/28/16 0957  . sodium chloride flush (NS) 0.9 % injection 3 mL  3 mL Intravenous PRN Delano Metzobert Schertz, MD      . topiramate (TOPAMAX) tablet 250 mg  250 mg Oral BID Erick BlinksJehanzeb Memon, MD   250 mg at 06/28/16 [id number]     Discharge Medications: Please see discharge summary for a list of discharge medications.  Relevant Imaging Results:  Relevant Lab Results:   Additional Information HD M,W,F, [place], [place]  Chistine Dematteo, Juleen ChinaHeather D, LCSW

## 2016-06-28 NOTE — Consult Note (Addendum)
WOC consult requested for abd folds redness and rash. Discussed patient via phone with bedside nurse.  She describes skin folds as being red, macerated, and moist with patchy areas of partial thickness skin loss.  Appearance is consistent with intertrigo/fungal rash.  Interdry silver-impregnated fabric ordered for use by bedside nurses and instructions provided.  This product should remain in place for 5 days for optimal plan of care to provide antimicrobial benefits and wick moisture away from skin.  Please re-consult if further assistance is needed.  Thank-you,  Cammie Mcgee MSN, RN, CWOCN, [facility] [facility], CNS [phone]

## 2016-06-29 DIAGNOSIS — E162 Hypoglycemia, unspecified: Principal | ICD-10-CM

## 2016-06-29 DIAGNOSIS — Z992 Dependence on renal dialysis: Secondary | ICD-10-CM

## 2016-06-29 DIAGNOSIS — N186 End stage renal disease: Secondary | ICD-10-CM

## 2016-06-29 LAB — CBC
HCT: 24.1 % — ABNORMAL LOW (ref 36.0–46.0)
HEMOGLOBIN: 7.8 g/dL — AB (ref 12.0–15.0)
MCH: 31.2 pg (ref 26.0–34.0)
MCHC: 32.4 g/dL (ref 30.0–36.0)
MCV: 96.4 fL (ref 78.0–100.0)
Platelets: 162 10*3/uL (ref 150–400)
RBC: 2.5 MIL/uL — ABNORMAL LOW (ref 3.87–5.11)
RDW: 19.1 % — ABNORMAL HIGH (ref 11.5–15.5)
WBC: 8.6 10*3/uL (ref 4.0–10.5)

## 2016-06-29 LAB — RENAL FUNCTION PANEL
ANION GAP: 13 (ref 5–15)
Albumin: 3.1 g/dL — ABNORMAL LOW (ref 3.5–5.0)
BUN: 42 mg/dL — ABNORMAL HIGH (ref 6–20)
CALCIUM: 6.7 mg/dL — AB (ref 8.9–10.3)
CHLORIDE: 101 mmol/L (ref 101–111)
CO2: 22 mmol/L (ref 22–32)
Creatinine, Ser: 4.87 mg/dL — ABNORMAL HIGH (ref 0.44–1.00)
GFR calc Af Amer: 12 mL/min — ABNORMAL LOW (ref >60)
GFR calc non Af Amer: 10 mL/min — ABNORMAL LOW (ref >60)
GLUCOSE: 95 mg/dL (ref 65–99)
Phosphorus: 3.3 mg/dL (ref 2.5–4.6)
Potassium: 3.4 mmol/L — ABNORMAL LOW (ref 3.5–5.1)
SODIUM: 136 mmol/L (ref 135–145)

## 2016-06-29 LAB — GLUCOSE, CAPILLARY
GLUCOSE-CAPILLARY: 87 mg/dL (ref 65–99)
GLUCOSE-CAPILLARY: 96 mg/dL (ref 65–99)
GLUCOSE-CAPILLARY: 115 mg/dL — AB (ref 65–99)
Glucose-Capillary: 107 mg/dL — ABNORMAL HIGH (ref 65–99)

## 2016-06-29 LAB — HEPATITIS B SURFACE ANTIGEN: Hepatitis B Surface Ag: NEGATIVE

## 2016-06-29 MED ORDER — HYDROMORPHONE HCL 2 MG PO TABS
2.0000 mg | ORAL_TABLET | Freq: Once | ORAL | Status: AC
  Administered 2016-06-29: 2 mg via ORAL
  Filled 2016-06-29: qty 1

## 2016-06-29 MED ORDER — HYDROMORPHONE HCL 1 MG/ML IJ SOLN
2.0000 mg | Freq: Once | INTRAMUSCULAR | Status: AC
  Administered 2016-06-29: 2 mg via INTRAVENOUS
  Filled 2016-06-29: qty 2

## 2016-06-29 MED ORDER — EPOETIN ALFA 10000 UNIT/ML IJ SOLN
INTRAMUSCULAR | Status: AC
  Administered 2016-06-29: 10000 [IU] via INTRAVENOUS
  Filled 2016-06-29: qty 1

## 2016-06-29 MED ORDER — HYDROMORPHONE HCL 4 MG PO TABS
6.0000 mg | ORAL_TABLET | Freq: Three times a day (TID) | ORAL | Status: DC | PRN
  Administered 2016-06-30: 6 mg via ORAL
  Filled 2016-06-29 (×2): qty 1

## 2016-06-29 MED ORDER — GUAIFENESIN-DM 100-10 MG/5ML PO SYRP
5.0000 mL | ORAL_SOLUTION | Freq: Four times a day (QID) | ORAL | Status: DC | PRN
  Filled 2016-06-29: qty 5

## 2016-06-29 MED ORDER — HYDROMORPHONE HCL 4 MG PO TABS
4.0000 mg | ORAL_TABLET | Freq: Three times a day (TID) | ORAL | Status: DC | PRN
  Administered 2016-06-29 (×2): 4 mg via ORAL
  Filled 2016-06-29 (×2): qty 1

## 2016-06-29 MED ORDER — HYDROMORPHONE HCL 4 MG PO TABS: 6.0000 mg | ORAL_TABLET | Freq: Three times a day (TID) | ORAL | Status: DC | PRN

## 2016-06-29 NOTE — Procedures (Signed)
   HEMODIALYSIS TREATMENT NOTE:  Unable to complete 3 hour HD session as ordered.  Dialyzed without incident for one hour, then became increasingly restless/fidgeting, c/o "whole body aches" and asking to "see my kidney doctor."  Dr. Irene Limbo was notified of pt's request for increase in pain medication frequency.  Warm blankets and graham cracker snack were provided and, afterwards, pt fell asleep.  She woke with 10 minutes remaining on dialysis and asked for treatment to be stopped.  She offered no explanation beyond, "I"m just ready to come off.  I think I'm coming down with something."  Afebrile, VSS.  HD was discontinued with 10 minutes RTD.  All blood was returned and hemostasis was achieved within 15 minutes. Net UF 1.6L (goal 1.5).  Dr. Wolfgang Phoenix was notified. Report called to Morrie Sheldon, Charity fundraiser.  Arman Filter, RN, CDN.

## 2016-06-29 NOTE — Care Management Important Message (Signed)
Important Message  Patient Details  Name: Erin [name] MRN: [id number] Date of Birth: [birth year]   Medicare Important Message Given:  Yes    Malcolm Metro, RN 06/29/2016, 3:30 PM

## 2016-06-29 NOTE — Progress Notes (Addendum)
Erin [name]  MRN: [id number]  DOB/AGE: [birth year] 43 y.o.  Primary Care Physician:[facility]  Admit date: 06/27/2016  Chief Complaint:  Chief Complaint  Patient presents with  . Hypoglycemia    S-Pt presented on  06/27/2016 with  Chief Complaint  Patient presents with  . Hypoglycemia  .    Pt today feels better   . [START ON 06/30/2016] conjugated estrogens  1 Applicatorful Vaginal Once per day on Mon Thu  . enoxaparin (LOVENOX) injection  30 mg Subcutaneous Q24H  . epoetin (EPOGEN/PROCRIT) injection  10,000 Units Intravenous Q T,Th,Sa-HD  . fludrocortisone  0.1 mg Oral Daily  . gabapentin  400 mg Oral QHS  . hydrocortisone  10 mg Oral Daily  . hydrocortisone  5 mg Oral QHS  . levothyroxine  112 mcg Oral QAC breakfast  . nicotine  21 mg Transdermal Daily  . nystatin cream   Topical BID  . pantoprazole  40 mg Oral Daily  . polyethylene glycol  17 g Oral Daily  . pravastatin  20 mg Oral q1800  . risperiDONE  2 mg Oral QHS  . risperidone  4 mg Oral q morning - 10a  . sertraline  50 mg Oral Daily  . sevelamer carbonate  2,400 mg Oral BID WC  . topiramate  250 mg Oral BID      Physical Exam: Vital signs in last 24 hours: Temp:  [97.1 F (36.2 C)-98.2 F (36.8 C)] 97.1 F (36.2 C) (08/30 0400) Pulse Rate:  [71-95] 78 (08/30 0000) Resp:  [11-24] 17 (08/30 0000) BP: (115-177)/(54-92) 116/92 (08/30 0000) SpO2:  [97 %-99 %] 97 % (08/30 0000) Weight:  [170 lb 3.1 oz (77.2 kg)-170 lb 10.2 oz (77.4 kg)] 170 lb 10.2 oz (77.4 kg) (08/30 0500) Weight change: 4 lb 3.1 oz (1.903 kg) Last BM Date:  (Pt unable to state when last BM was)  Intake/Output from previous day: 08/29 0701 - 08/30 0700 In: 480 [P.O.:480] Out: 2400 [Urine:300] Total I/O In: -  Out: 2100 [Other:2100]   Physical Exam: General- pt is awake,alert, oriented to time place and person Resp- No acute REsp distress,  NO Rhonchi CVS- S1S2 regular ij rate and rhythm GIT- BS+, soft, NT,  ND EXT- trace LE Edema, Cyanosis Access--Right AVG   Lab Results: CBC  Recent Labs  06/27/16 1602 06/28/16 0421  WBC 9.8 9.4  HGB 7.4* 8.9*  HCT 23.8* 28.0*  PLT 156 176    BMET  Recent Labs  06/27/16 1420 06/28/16 0421  NA 134* 140  K 3.9 4.7  CL 108 109  CO2 18* 18*  GLUCOSE 91 95  BUN 65* 65*  CREATININE 6.89* 6.78*  CALCIUM 7.1* 7.4*    MICRO Recent Results (from the past 240 hour(s))  MRSA PCR Screening     Status: None   Collection Time: 06/27/16  9:30 PM  Result Value Ref Range Status   MRSA by PCR NEGATIVE NEGATIVE Final    Comment:        The GeneXpert MRSA Assay (FDA approved for NASAL specimens only), is one component of a comprehensive MRSA colonization surveillance program. It is not intended to diagnose MRSA infection nor to guide or monitor treatment for MRSA infections.       Lab Results  Component Value Date   PTH 292 (H) 03/04/2016   CALCIUM 7.4 (L) 06/28/2016   PHOS 1.9 (L) 03/07/2016          Impression: 1)Renal ESRD on HD  Pt is on Mon/Wed/Friday schedule               Pt was last dialyzed yesterday.               Will dialyze today       2)HTN BP stable  3)Anemia In ESRD the goal for hgb is 9--11. hgb just below the goal On epo during HD  4)CKD Mineral-Bone Disorder PTH acceptable. Secondary Hyperparathyroidism present . Phosphorus not at goal.  will recheck  5)CNS-admitted with AMS Primary MD following  6)Electrolytes Normokalemic NOrmonatremic    7)Acid base Co2 at goal     Plan:  Will dialyze today Will use 2 k bath Will try t take off 1.5 liters off      BHUTANI,MANPREET S 06/29/2016, 5:53 AM

## 2016-06-29 NOTE — Progress Notes (Signed)
Nutrition Brief Note  Patient identified on the Malnutrition Screening Tool (MST) Report  Wt Readings from Last 15 Encounters:  06/29/16 170 lb 10.2 oz (77.4 kg)  06/21/16 166 lb 10.7 oz (75.6 kg)  06/19/16 165 lb 5.5 oz (75 kg)  06/03/16 145 lb (65.8 kg)  05/27/16 155 lb (70.3 kg)  05/19/16 150 lb (68 kg)  05/09/16 150 lb (68 kg)  03/23/16 161 lb (73 kg)  03/07/16 154 lb 5.2 oz (70 kg)  02/29/16 160 lb 15 oz (73 kg)  02/18/16 174 lb 2.6 oz (79 kg)  11/15/15 175 lb (79.4 kg)  11/03/15 175 lb (79.4 kg)  10/23/15 178 lb 9.2 oz (81 kg)  10/13/15 175 lb (79.4 kg)    Body mass index is 35.66 kg/m. Patient meets criteria for Obese based on current BMI.   Current diet order is Renal w/ fluid restriction, patient is consuming approximately 100% of meals at this time. Labs and medications reviewed.   Pt states she is eating fine. She says her weight fluctuates and this is normal for her. She says recently her UBW has been "around 77".   She does not follow any type of diet at home. Though she has little control over this as she is blind and lives in a home. She is also non compliant and self limits her dialysis sessions. She does not like the fluid restriction and she drinks sweet tea frequently.    Palliative care is following to help establish GOC.   RD took her dinner request. She asked for [place] and cheese sandwich. The cheese can serve as her daily daily serving and the milk can be removed.   Christophe Louis RD, LDN, CNSC Clinical Nutrition Pager: [phone] 06/29/2016 11:42 AM

## 2016-06-29 NOTE — Care Management Note (Signed)
Case Management Note  Patient Details  Name: Erin [name] MRN: [id number] Date of Birth: 08-Feb-1971  Subjective/Objective:                  Pt admitted with hypoglycemia and volume overload. She is from [facility]. She is ind with ADL's. She is on HD three days a week. She plans to return to group home at [place]. CSW is aware and is working to make arrangements for return to facility at [place].   Action/Plan: No CM needs anticipated.   Expected Discharge Date:  06/30/16               Expected Discharge Plan:  Group Home  In-House Referral:  Clinical Social Work  Discharge planning Services  CM Consult  Post Acute Care Choice:  NA Choice offered to:  NA  DME Arranged:    DME Agency:     HH Arranged:    HH Agency:     Status of Service:  Completed, signed off  If discussed at [employer], dates discussed:    Additional Comments:  Malcolm Metro, RN 06/29/2016, 3:31 PM

## 2016-06-29 NOTE — Progress Notes (Signed)
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 Admitted for hypoglycemia.  Assessment/Plan: 1. Hypoglycemia. Resolved. Likely due to ESRD/malnutrition. She was not taking any diabetic medications prior to admission.  2. Acute metabolic encephalopathy. Resolved. Likely combination of hypoglycemia, uremia, possible effects of narcotics.   3. ESRD. Somewhat noncompliant. 4. Anemia of end-stage renal disease. Stable. 5. 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 Continue hormone replacement including Synthroid and hydrocortisone. 6. Fungal rash. She has a rash on her lower abdomen. Wound care placed an interdry silver-impregnated fabric. This should remain in place for 5 days.   7. Legally blind.  8. Per chart non-compliance with care, self-limits dialysis. Mother and primary caretaker died early this year Mar 09, 2016.  13. PMH Bipolar, anxiety, epilepsy, narcolepsy, CVA 03/09/01 with resultant seizures. 10. Chronic pain, chronic headaches treated with fentanyl patch, oxy and hydromorphone  11. Tobacco use disorder.   Overall improved. Hypoglycemia resolved. Plan for further hemodialysis today. Resume medications for chronic pain.  Transfer to medical floor.  Anticipate discharge in 24 hours.   DVT prophylaxis: Lovenox  Code Status: Full  Family Communication: No family bedside Disposition Plan: Anticipate discharge in 24 hours.  Brendia Sacks, MD  [facility] Direct contact: [phone] --Via amion app OR  --[email or web address]; password [email or web address]  7PM-7AM contact night coverage as above 06/29/2016, 4:45 AM  LOS: 2 days   Consultants:  Palliative care   Nephrology    Wound care  Procedures:  HD 8/30.  Antimicrobials:  None   HPI/Subjective: Did not fully comply with dialysis, emotionally labile during treatment.   States she still has a headache and body pain. Eating well. Denies nausea and vomiting.   Objective: Vitals:   06/28/16 2015 06/28/16 2030 06/29/16 0000 06/29/16 0400  BP: (!) 171/71 (!) 160/76 (!) 116/92   Pulse: 80 82 78   Resp: 18 20 17    Temp:   97.6 F (36.4 C) 97.1 F (36.2 C)  TempSrc:   Oral Oral  SpO2:   97%   Weight:      Height:        Intake/Output Summary (Last 24 hours) at 06/29/16 0445 Last data filed at 06/28/16 2015  Gross per 24 hour  Intake              630 ml  Output             2600 ml  Net            -1970 ml     Filed Weights   06/27/16 1950 06/28/16 0500 06/28/16 1715  Weight: 75.8 kg (167 lb 1.7 oz) 76.9 kg (169 lb 8.5 oz) 77.2 kg (170 lb 3.1 oz)    Exam:   Constitutional:  . Appears calm and comfortable Respiratory:  . CTA bilaterally, no w/r/r.  . Respiratory effort normal. No retractions or accessory muscle use Cardiovascular:  . RRR, no m/r/g . 2+ bilateral LE extremity edema   . Telemetry sinus rhythm  Abdomen:   soft, non tender Musculoskeletal:  o Moves all extremities  Psychiatric:  . Mental status o Mood, affect appropriate o Orientation to person, place, time   I have personally reviewed following labs and imaging studies:  BMP reviewed.  Hemoglobin 7.8 stable  Scheduled Meds: . [START ON 06/30/2016] conjugated estrogens  1 Applicatorful Vaginal Once per day on Mon Thu  . enoxaparin (LOVENOX) injection  30 mg Subcutaneous Q24H  . epoetin (EPOGEN/PROCRIT) injection  10,000 Units Intravenous Q T,Th,Sa-HD  . fludrocortisone  0.1 mg Oral Daily  . gabapentin  400 mg Oral QHS  . hydrocortisone  10 mg Oral Daily  . hydrocortisone  5 mg Oral QHS  . levothyroxine  112 mcg Oral QAC breakfast  . nicotine  21 mg Transdermal Daily  . nystatin cream   Topical BID   . pantoprazole  40 mg Oral Daily  . polyethylene glycol  17 g Oral Daily  . pravastatin  20 mg Oral q1800  . risperiDONE  2 mg Oral QHS  . risperidone  4 mg Oral q morning - 10a  . sertraline  50 mg Oral Daily  . sevelamer carbonate  2,400 mg Oral BID WC  . topiramate  250 mg Oral BID   Continuous Infusions:   Principal Problem:   Hypoglycemia Active Problems:   Anemia   Bipolar 1 disorder, mixed, moderate (HCC)   ESRD on dialysis (HCC)   Fungal rash of trunk   Seizure disorder (HCC)   Blindness   Altered mental status   Chronic pain   History of CVA (cerebrovascular accident)   Palliative care encounter   Goals of care, counseling/discussion   LOS: 2 days   Time spent: 25 minutes    By signing my name below, I, Cynda AcresHailei Fulton, attest that this documentation has been prepared under the direction and in the presence of Brendia Sacksaniel Goodrich, MD. Electronically signed: Cynda AcresHailei Fulton, Scribe. 06/29/16 10:39 AM   I personally performed the services described in this documentation. All medical record entries made by the scribe were at my direction. I have reviewed the chart and agree that the record reflects my personal performance and is accurate and complete. Brendia Sacksaniel Goodrich, MD

## 2016-06-29 NOTE — Progress Notes (Signed)
Patient c/o pain, sore throat, cough, and congestion.  She states that "she is coming down with something." Hospitilist paged.

## 2016-06-30 DIAGNOSIS — G9340 Encephalopathy, unspecified: Secondary | ICD-10-CM

## 2016-06-30 LAB — GLUCOSE, CAPILLARY: Glucose-Capillary: 96 mg/dL (ref 65–99)

## 2016-06-30 MED ORDER — NYSTATIN 100000 UNIT/GM EX OINT: 1.0000 "application " | TOPICAL_OINTMENT | Freq: Two times a day (BID) | CUTANEOUS | 0 refills | Status: AC

## 2016-06-30 MED ORDER — HYDROMORPHONE HCL 4 MG PO TABS: 6.0000 mg | ORAL_TABLET | Freq: Three times a day (TID) | ORAL | Status: DC | PRN

## 2016-06-30 NOTE — Discharge Summary (Addendum)
Physician Discharge Summary  Erin [name]:[id number] DOB: 1970/12/28 DOA: 06/27/2016  PCP: [facility]  Admit date: 06/27/2016 Discharge date: 06/30/2016  Recommendations for Outpatient Follow-up:  1. Follow up recent hypoglycemia likely related to malnutrition and end-stage renal disease. 2. Consider CBG checks several times per day to monitor. 3. Resolution of cutaneous fungal rash over her abdomen.  Follow-up Information    [facility]. Go on 07/08/2016.   Specialty:  Family Medicine Why:  At 2:30 Contact information: [address] [place] [facility] [address] [phone]          Discharge Diagnoses:  1. Hypoglycemia  2. Acute metabolic encephalopathy  3. ESRD  4. Anemia of end-stage renal disease  5. 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 Admitted for hypoglycemia.  Hospital Course:  Patient was admitted treated with dextrose infusion with rapid resolution of encephalopathy. Hospitalization was uncomplicated and she had no significant recurrent hypoglycemia or confusion. She did have some noncompliance with hemodialysis ending her treatments a few minutes early but otherwise participate with her care during this hospitalization. She was seen by wound care with recommendations for local treatment of fungal rash on the abdomen. Individual issues as below.  1. Hypoglycemia. Resolved. No further issues. Likely due to ESRD/malnutrition. Was not [place] diabetic medications prior to admission.  2. Acute  metabolic encephalopathy. Resolved. Likely combination of hypoglycemia, uremia, possible effects of narcotics although this seems less likely . 3. ESRD. Somewhat noncompliant. 4. Anemia of end-stage renal disease. Remains stable. 5. 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 Continue hormone replacement including Synthroid and hydrocortisone. 6. Fungal rash. Wound care placed an interdry silver-impregnated fabric. This should remain in place for 5 days.  7. Legally blind.  8. Per chart non-compliance with care, self-limits dialysis. Mother and primary caretaker died early this year 2016-02-22.  70. PMH Bipolar, anxiety, epilepsy, narcolepsy, CVA 21-Feb-2001 with resultant seizures. 10. Chronic pain, chronic headaches treated with hydromorphone orally. Narcotics database reviewed. 11. Tobacco use disorder.  Consultants:  Palliative care   Nephrology   Wound care   Procedures:  HD  Antimicrobials:  None   Discharge Instructions  Discharge Instructions    Diet general    Complete by:  As directed   Discharge instructions    Complete by:  As directed   Call your physician or seek immediate medical attention for pain, shortness of breath, dizziness, confusion or worsening of condition.   Increase activity slowly    Complete by:  As directed       Medication List    TAKE these medications   estradiol 0.1 MG/GM vaginal cream Commonly known as:  ESTRACE Place 1 Applicatorful vaginally 2 (two) times a week. Mondays and Thursdays   fludrocortisone 0.1 MG tablet Commonly known as:  FLORINEF Take 0.1 mg by mouth daily.   furosemide 40 MG tablet Commonly known as:  LASIX Take 40 mg by mouth 2 (two) times daily.   gabapentin 100 MG capsule Commonly known as:  NEURONTIN Take 400 mg by mouth at  bedtime.   hydrocortisone 10 MG tablet Commonly known as:  CORTEF Take 5-10 mg by mouth 2 (two) times daily. Pt takes 10 mg in the morning and 5 mg at bedtime.    HYDROmorphone 4 MG tablet Commonly known as:  DILAUDID Take 1.5 tablets (6 mg total) by mouth every 8 (eight) hours as needed for moderate pain or severe pain.   levothyroxine 112 MCG tablet Commonly known as:  SYNTHROID, LEVOTHROID Take 112 mcg by mouth daily.   loratadine 10 MG tablet Commonly known as:  CLARITIN Take 10 mg by mouth daily.   lovastatin 40 MG tablet Commonly known as:  MEVACOR Take 20 mg by mouth every evening.   Melatonin 5 MG Tabs Take 2 tablets by mouth at bedtime.   nystatin ointment Commonly known as:  MYCOSTATIN Apply 1 application topically 2 (two) times daily. Apply to affected area (groin, abdominal fold).   pantoprazole 40 MG tablet Commonly known as:  PROTONIX Take 1 tablet (40 mg total) by mouth daily.   risperidone 4 MG tablet Commonly known as:  RISPERDAL Take 2-4 mg by mouth 2 (two) times daily. Pt takes 4 mg in the morning and 2 mg at bedtime.   sertraline 50 MG tablet Commonly known as:  ZOLOFT Take 50 mg by mouth daily.   sevelamer carbonate 800 MG tablet Commonly known as:  RENVELA Take 2,400 mg by mouth 2 (two) times daily.   topiramate 200 MG tablet Commonly known as:  TOPAMAX Take 200 mg by mouth 2 (two) times daily. Pt takes with a 50 mg tablet.   topiramate 50 MG tablet Commonly known as:  TOPAMAX Take 50 mg by mouth 2 (two) times daily. Pt takes with a 200 mg tablet.   Vitamin D (Ergocalciferol) 50000 units Caps capsule Commonly known as:  DRISDOL Take 50,000 Units by mouth every 30 (thirty) days. Takes on the 8th of every month      Allergies  Allergen Reactions  . Dhea [Nutritional Supplements] Anaphylaxis    Patient states medication is DHE for migraines, not DHEA  . Demerol [Meperidine] Other (See Comments)    Reaction:  Hallucinations   . Floxin [Ofloxacin] Other (See Comments)    Reaction:  Hallucinations   . Nsaids Nausea And Vomiting and Swelling  . Nubain [Nalbuphine Hcl] Other (See Comments)     Reaction:  Hallucinations   . Phenergan [Promethazine Hcl] Other (See Comments)    Reaction:  Restless legs   . Stadol [Butorphanol] Other (See Comments)    Reaction:  Hallucinations   . Erythromycin Diarrhea and Rash    The results of significant diagnostics from this hospitalization (including imaging, microbiology, ancillary and laboratory) are listed below for reference.    Significant Diagnostic Studies: Ct Head Wo Contrast  Result Date: 06/27/2016 CLINICAL DATA:  Altered mental status.  Hyperglycemia. EXAM: CT HEAD WITHOUT CONTRAST TECHNIQUE: Contiguous axial images were obtained from the base of the skull through the vertex without intravenous contrast. COMPARISON:  03/04/2016 FINDINGS: Brain: Stable focal right frontal encephalomalacia. Empty or partially empty sella. Mild cerebral atrophy for age. Otherwise, the brainstem, cerebellum, cerebral peduncles, thalami, basal ganglia, basilar cisterns, and ventricular system appear within normal limits. No intracranial hemorrhage, mass lesion, or acute CVA. Vascular: Unremarkable Skull: Right frontal calvarial thinning, likely related to remote surgery. This is unchanged from prior exams. Sinuses/Orbits: Unremarkable Other: No supplemental non-categorized findings. IMPRESSION: 1. Stable appearance of the brain with right frontal encephalomalacia underlying the region of calvarial thinning like  alert related to remote prior surgery. No acute intracranial findings. 2. Mild cerebral atrophy for age. 3. Mildly prominent fluid density sella compatible with empty or partially empty sella. This is likewise chronically stable. Electronically Signed   By: Gaylyn RongWalter  Liebkemann M.D.   On: 06/27/2016 16:47   Dg Chest Portable 1 View  Result Date: 06/27/2016 CLINICAL DATA:  Hypoglycemia.  Altered mental status.  Smoker. EXAM: PORTABLE CHEST 1 VIEW COMPARISON:  CT 06/04/2016.  Radiographs 05/19/2016. FINDINGS: 1416 hours. Stable mild cardiomegaly. The mediastinal  contours are unremarkable. The lungs are clear. There is no pleural effusion or pneumothorax. No acute osseous findings are evident. IMPRESSION: No active cardiopulmonary process.  Stable mild cardiomegaly. Electronically Signed   By: Carey BullocksWilliam  Veazey M.D.   On: 06/27/2016 14:32   Microbiology: Recent Results (from the past 240 hour(s))  MRSA PCR Screening     Status: None   Collection Time: 06/27/16  9:30 PM  Result Value Ref Range Status   MRSA by PCR NEGATIVE NEGATIVE Final    Comment:        The GeneXpert MRSA Assay (FDA approved for NASAL specimens only), is one component of a comprehensive MRSA colonization surveillance program. It is not intended to diagnose MRSA infection nor to guide or monitor treatment for MRSA infections.      Labs: Basic Metabolic Panel:  Recent Labs Lab 06/27/16 1420 06/28/16 0421 06/29/16 0545  NA 134* 140 136  K 3.9 4.7 3.4*  CL 108 109 101  CO2 18* 18* 22  GLUCOSE 91 95 95  BUN 65* 65* 42*  CREATININE 6.89* 6.78* 4.87*  CALCIUM 7.1* 7.4* 6.7*  PHOS  --   --  3.3   Liver Function Tests:  Recent Labs Lab 06/27/16 1420 06/29/16 0545  AST 83*  --   ALT 30  --   ALKPHOS 354*  --   BILITOT 1.4*  --   PROT 5.6*  --   ALBUMIN 3.1* 3.1*    Recent Labs Lab 06/27/16 1602  AMMONIA 46*   CBC:  Recent Labs Lab 06/27/16 1602 06/28/16 0421 06/29/16 0546  WBC 9.8 9.4 8.6  NEUTROABS 8.9*  --   --   HGB 7.4* 8.9* 7.8*  HCT 23.8* 28.0* 24.1*  MCV 97.9 97.2 96.4  PLT 156 176 162    CBG:  Recent Labs Lab 06/29/16 0428 06/29/16 0722 06/29/16 1123 06/29/16 1953 06/30/16 0223  GLUCAP 107* 87 96 115* 96    Principal Problem:   Hypoglycemia Active Problems:   Anemia   Bipolar 1 disorder, mixed, moderate (HCC)   ESRD on dialysis (HCC)   Fungal rash of trunk   Seizure disorder (HCC)   Blindness   Altered mental status   Chronic pain   History of CVA (cerebrovascular accident)   Palliative care encounter   Goals of  care, counseling/discussion   Time coordinating discharge: 35 minutes  Signed:  Brendia Sacksaniel Treyvin Glidden, MD [facility] 06/30/2016, 12:06 PM  By signing my name below, I, Cynda AcresHailei Fulton, attest that this documentation has been prepared under the direction and in the presence of Brendia Sacksaniel Reshanda Lewey, MD. Electronically signed: Cynda AcresHailei Fulton, Scribe. 06/30/16 9:56 AM   I personally performed the services described in this documentation. All medical record entries made by the scribe were at my direction. I have reviewed the chart and agree that the record reflects my personal performance and is accurate and complete. Brendia Sacksaniel Xochitl Egle, MD

## 2016-06-30 NOTE — Progress Notes (Signed)
Daily Progress Note   Patient Name: Erin [name]       Date: 06/30/2016 DOB: 02-14-1971  Age: 45 y.o. MRN#: [id number] Attending Physician: Standley Brooking, MD Primary Care Physician: [facility] Admit Date: 06/27/2016  Reason for Consultation/Follow-up: Disposition, Establishing goals of care and Psychosocial/spiritual support  Subjective: Erin [name] is resting quietly in bed. She greets me as I enter. We talk about disposition today. We also talk about healthcare power of attorney, she states, "I may do that". We also talk about guardianship and my worry that she needs an advocate. She states that this time she is not interested in having a guardian. We talk about returning to [name]'s group home, and the possibility that she would benefit from a different group home. She states, "I think that they could find a better fit for me". She talks about seeing her pain management doctor at [facility] every few months, and that her main doctors are in [place]. Erin [name] shares that not only did she lose her mother earlier in the year (suddenly due to an aneurysm, she found her) but she also lost her father a few weeks ago with throat cancer.  She asked that I call her brother Erin [name] in [place]. She has some trouble giving me the number [phone], but this is not enough digits to complete the call.  Length of Stay: 3  Current Medications: Scheduled Meds:  . conjugated estrogens  1 Applicatorful Vaginal Once per day on Mon Thu  . enoxaparin (LOVENOX) injection  30 mg Subcutaneous Q24H  . epoetin (EPOGEN/PROCRIT) injection  10,000 Units Intravenous Q T,Th,Sa-HD  . fludrocortisone  0.1 mg Oral Daily  . gabapentin  400 mg Oral QHS  . hydrocortisone  10 mg Oral Daily  .  hydrocortisone  5 mg Oral QHS  . levothyroxine  112 mcg Oral QAC breakfast  . nicotine  21 mg Transdermal Daily  . nystatin cream   Topical BID  . pantoprazole  40 mg Oral Daily  . polyethylene glycol  17 g Oral Daily  . pravastatin  20 mg Oral q1800  . risperiDONE  2 mg Oral QHS  . risperidone  4 mg Oral q morning - 10a  . sertraline  50 mg Oral Daily  . sevelamer carbonate  2,400  mg Oral BID WC  . topiramate  250 mg Oral BID    Continuous Infusions:    PRN Meds: sodium chloride, sodium chloride, sodium chloride, acetaminophen **OR** acetaminophen, guaiFENesin-dextromethorphan, HYDROmorphone, lidocaine (PF), lidocaine-prilocaine, pentafluoroprop-tetrafluoroeth, sodium chloride flush  Physical Exam  Constitutional: No distress.  Chronically ill appearing, frail, no apparent distress  HENT:  Head: Normocephalic and atraumatic.  Eyes:  Blind  Cardiovascular: Normal rate.   Pulmonary/Chest: Effort normal. No respiratory distress.  Abdominal: Soft. She exhibits no distension.  Neurological: She is alert.  Skin: Skin is warm and dry.  Nursing note and vitals reviewed.           Vital Signs: BP (!) 142/86 (BP Location: Left Arm)   Pulse 63   Temp 98.3 F (36.8 C) (Oral)   Resp 18   Ht 4\' 10"  (1.473 m)   Wt 73.5 kg (162 lb 1.6 oz)   LMP 11/21/2007 (Approximate) Comment: No period since 2009,due to"DVT" per pt since they stopped BCP...no periods  SpO2 99%   BMI 33.88 kg/m  SpO2: SpO2: 99 % O2 Device: O2 Device: Not Delivered O2 Flow Rate:    Intake/output summary:  Intake/Output Summary (Last 24 hours) at 06/30/16 1317 Last data filed at 06/30/16 [id number]  Gross per 24 hour  Intake              360 ml  Output             1623 ml  Net            -1263 ml   LBM: Last BM Date: 06/26/16 Baseline Weight: Weight: 75.3 kg (166 lb) Most recent weight: Weight: 73.5 kg (162 lb 1.6 oz)       Palliative Assessment/Data:    Flowsheet Rows   Flowsheet Row Most Recent Value    Intake Tab  Referral Department  Hospitalist  Unit at Time of Referral  ICU  Palliative Care Primary Diagnosis  Nephrology  Date Notified  06/28/16  Palliative Care Type  New Palliative care  Reason for referral  Clarify Goals of Care, Advance Care Planning  Date of Admission  06/27/16  Date first seen by Palliative Care  06/28/16  # of days Palliative referral response time  0 Day(s)  # of days IP prior to Palliative referral  1  Clinical Assessment  Palliative Performance Scale Score  40%  Pain Max last 24 hours  Not able to report  Pain Min Last 24 hours  Not able to report  Dyspnea Max Last 24 Hours  Not able to report  Dyspnea Min Last 24 hours  Not able to report  Psychosocial & Spiritual Assessment  Palliative Care Outcomes  Patient/Family meeting held?  Yes  Who was at the meeting?  patient only today  Palliative Care Outcomes  Clarified goals of care, Provided psychosocial or spiritual support  Palliative Care follow-up planned  -- [follow up while at [facility]]      Patient Active Problem List   Diagnosis Date Noted  . Palliative care encounter   . Goals of care, counseling/discussion   . Hypoglycemia 06/27/2016  . Fungal rash of trunk 06/27/2016  . Seizure disorder (HCC) 06/27/2016  . Blindness 06/27/2016  . Altered mental status 06/27/2016  . Chronic pain 06/27/2016  . History of CVA (cerebrovascular accident) 06/27/2016  . GI bleed 03/04/2016  . Generalized weakness 03/04/2016  . Hypothyroidism 03/04/2016  . GERD (gastroesophageal reflux disease) 03/04/2016  . ESRD on dialysis (HCC) 03/04/2016  .  Bipolar 1 disorder, mixed, moderate (HCC) 02/26/2016  . 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 Anemia 02/25/2016    Palliative Care Assessment & Plan   Patient Profile: 45 y.o. female  with past medical history of Brain surgery in 1976 with pituitary excision, psychosis, history of CVA, epilepsy,  depression, anxiety, bipolar disorder, chronic headaches, blindness, DVT, kidney failure on dialysis admitted on 06/27/2016 with hypoglycemia, E SRD with noncompliance.   Assessment: As above  Recommendations/Plan:  Continue to treat the treatable, continue hemodialysis. We discuss the concepts of healthcare power of attorney, guardianship, and code status realities.  Goals of Care and Additional Recommendations:  Limitations on Scope of Treatment: Full Scope Treatment and Continue to treat the treatable.  Code Status:    Code Status Orders        Start     Ordered   06/27/16 2027  Full code  Continuous     06/27/16 2027    Code Status History    Date Active Date Inactive Code Status Order ID Comments User Context   03/04/2016  6:04 AM 03/07/2016  6:24 PM Full Code [id number]  Oralia Manis, MD Inpatient   02/25/2016  3:36 PM 03/01/2016  3:06 PM Full Code [id number]  Enedina Finner, MD Inpatient       Prognosis:   Unable to determine, based on outcomes.  Discharge Planning:  Return to Erin [name]'s group home when able.   Care plan was discussed with nursing staff, case manager, social worker, and Dr. Irene Limbo on next rounds.  Thank you for allowing the Palliative Medicine Team to assist in the care of this patient.   Time In: 1120 Time Out: 1150 Total Time 30 minutes Prolonged Time Billed  no       Greater than 50%  of this time was spent counseling and coordinating care related to the above assessment and plan.  Katheran Awe, NP  Please contact Palliative Medicine Team phone at [phone] for questions and concerns.

## 2016-06-30 NOTE — Progress Notes (Signed)
Hospitalist responded, orders for cough medication given.

## 2016-06-30 NOTE — NC FL2 (Signed)
[place] MEDICAID FL2 LEVEL OF CARE SCREENING TOOL     IDENTIFICATION  Patient Name: Erin [name] Birthdate: 1971-08-09 Sex: female Admission Date (Current Location): 06/27/2016  [place] and [place] Number:  [address] [id number] S Facility and Address:  [facility],  [address], [place] [address]      Provider Number: [id number]  Attending Physician Name and Address:  Standley Brooking, MD  Relative Name and Phone Number:       Current Level of Care: Hospital Recommended Level of Care: [facility]isted Living Facility Prior Approval Number:    Date Approved/Denied:   PASRR Number: [id number]  Discharge Plan: Domiciliary (Rest home)    Current Diagnoses: Patient Active Problem List   Diagnosis Date Noted  . Palliative care encounter   . Goals of care, counseling/discussion   . Hypoglycemia 06/27/2016  . Fungal rash of trunk 06/27/2016  . Seizure disorder (HCC) 06/27/2016  . Blindness 06/27/2016  . Altered mental status 06/27/2016  . Chronic pain 06/27/2016  . History of CVA (cerebrovascular accident) 06/27/2016  . GI bleed 03/04/2016  . Generalized weakness 03/04/2016  . Hypothyroidism 03/04/2016  . GERD (gastroesophageal reflux disease) 03/04/2016  . ESRD on dialysis (HCC) 03/04/2016  . Bipolar 1 disorder, mixed, moderate (HCC) 02/26/2016  . 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 Anemia 02/25/2016    Orientation RESPIRATION BLADDER Height & Weight     Self, Time, Situation, Place  Normal Continent Weight: 162 lb 1.6 oz (73.5 kg) Height:  4\' 10"  (147.3 cm)  BEHAVIORAL SYMPTOMS/MOOD NEUROLOGICAL BOWEL NUTRITION STATUS      Continent Diet (Diet renal with fluid restriction. Fluid restriction: 1200 mL fluid)  AMBULATORY STATUS COMMUNICATION OF NEEDS Skin   Limited Assist Verbally Bruising; abrasion to head. Blister to right finger. Moisture associated skin damage  to perineum. Intertriginous dermatitis abdomen, groin, perinuem.                        Personal Care Assistance Level of Assistance  Bathing, Feeding, Dressing Bathing Assistance: Limited assistance Feeding assistance: Limited assistance Dressing Assistance: Limited assistance     Functional Limitations Info  Sight, Hearing, Speech Sight Info: Impaired (Patient is blind) Hearing Info: Adequate Speech Info: Adequate    SPECIAL CARE FACTORS FREQUENCY                       Contractures      Additional Factors Info  Code Status, Allergies, Psychotropic Code Status Info: Full Code Allergies Info: Dhea Nutritional Supplements; Demerol, Floxin, Nsaids, Nubain, Phenergan, Stadol, Erythromycin Psychotropic Info: Neuronting, Risperdal, Zoloft, Topamax         Current Medications (06/30/2016):  This is the current hospital active medication list Current Facility-Administered Medications  Medication Dose Route Frequency Provider Last Rate Last Dose  . 0.9 %  sodium chloride infusion  250 mL Intravenous PRN Delano Metz, MD      . 0.9 %  sodium chloride infusion  100 mL Intravenous PRN Salomon Mast, MD      . 0.9 %  sodium chloride infusion  100 mL Intravenous PRN Salomon Mast, MD      . acetaminophen (TYLENOL) tablet 650 mg  650 mg Oral Q6H PRN Delano Metz, MD   650 mg at 06/29/16 2343   Or  . acetaminophen (TYLENOL) suppository 650 mg  650 mg Rectal Q6H PRN Delano Metz, MD      .  conjugated estrogens (PREMARIN) vaginal cream 1 Applicatorful  1 Applicatorful Vaginal Once per day on Mon Thu Erick Blinks, MD      . enoxaparin (LOVENOX) injection 30 mg  30 mg Subcutaneous Q24H Delano Metz, MD      . epoetin alfa (EPOGEN,PROCRIT) injection 10,000 Units  10,000 Units Intravenous Q T,Th,Sa-HD Salomon Mast, MD   10,000 Units at 06/29/16 1521  . fludrocortisone (FLORINEF) tablet 0.1 mg  0.1 mg Oral Daily Erick Blinks, MD   0.1 mg at 06/30/16 0818  .  gabapentin (NEURONTIN) capsule 400 mg  400 mg Oral QHS Erick Blinks, MD   400 mg at 06/29/16 2155  . guaiFENesin-dextromethorphan (ROBITUSSIN DM) 100-10 MG/5ML syrup 5 mL  5 mL Oral Q6H PRN Roma Kayser Schorr, NP      . hydrocortisone (CORTEF) tablet 10 mg  10 mg Oral Daily Erick Blinks, MD   10 mg at 06/30/16 0817  . hydrocortisone (CORTEF) tablet 5 mg  5 mg Oral QHS Erick Blinks, MD   5 mg at 06/29/16 2155  . HYDROmorphone (DILAUDID) tablet 6 mg  6 mg Oral Q8H PRN Roma Kayser Schorr, NP   6 mg at 06/30/16 1145  . levothyroxine (SYNTHROID, LEVOTHROID) tablet 112 mcg  112 mcg Oral QAC breakfast Delano Metz, MD   112 mcg at 06/30/16 0818  . lidocaine (PF) (XYLOCAINE) 1 % injection 5 mL  5 mL Intradermal PRN Salomon Mast, MD      . lidocaine-prilocaine (EMLA) cream 1 application  1 application Topical PRN Salomon Mast, MD      . nicotine (NICODERM CQ - dosed in mg/24 hours) patch 21 mg  21 mg Transdermal Daily Erick Blinks, MD   21 mg at 06/30/16 0818  . nystatin cream (MYCOSTATIN)   Topical BID Delano Metz, MD      . pantoprazole (PROTONIX) EC tablet 40 mg  40 mg Oral Daily Erick Blinks, MD   40 mg at 06/30/16 0818  . pentafluoroprop-tetrafluoroeth (GEBAUERS) aerosol 1 application  1 application Topical PRN Salomon Mast, MD      . polyethylene glycol (MIRALAX / GLYCOLAX) packet 17 g  17 g Oral Daily Erick Blinks, MD   17 g at 06/30/16 1000  . pravastatin (PRAVACHOL) tablet 20 mg  20 mg Oral q1800 Erick Blinks, MD   20 mg at 06/28/16 1701  . risperiDONE (RISPERDAL) tablet 2 mg  2 mg Oral QHS Delano Metz, MD   2 mg at 06/29/16 2155  . risperiDONE (RISPERDAL) tablet 4 mg  4 mg Oral q morning - 10a Delano Metz, MD   4 mg at 06/30/16 0818  . sertraline (ZOLOFT) tablet 50 mg  50 mg Oral Daily Erick Blinks, MD   50 mg at 06/30/16 0817  . sevelamer carbonate (RENVELA) tablet 2,400 mg  2,400 mg Oral BID WC Erick Blinks, MD   2,400 mg at 06/30/16 0818  . sodium  chloride flush (NS) 0.9 % injection 3 mL  3 mL Intravenous PRN Delano Metz, MD      . topiramate (TOPAMAX) tablet 250 mg  250 mg Oral BID Erick Blinks, MD   250 mg at 06/30/16 1610     Discharge Medications: Medication List    TAKE these medications   estradiol 0.1 MG/GM vaginal cream Commonly known as:  ESTRACE Place 1 Applicatorful vaginally 2 (two) times a week. Mondays and Thursdays  fludrocortisone 0.1 MG tablet Commonly known as:  FLORINEF Take 0.1 mg by mouth daily.  furosemide 40 MG tablet  Commonly known as:  LASIX Take 40 mg by mouth 2 (two) times daily.  gabapentin 100 MG capsule Commonly known as:  NEURONTIN Take 400 mg by mouth at bedtime.  hydrocortisone 10 MG tablet Commonly known as:  CORTEF Take 5-10 mg by mouth 2 (two) times daily. Pt takes 10 mg in the morning and 5 mg at bedtime.  HYDROmorphone 4 MG tablet Commonly known as:  DILAUDID Take 1.5 tablets (6 mg total) by mouth every 8 (eight) hours as needed for moderate pain or severe pain.  levothyroxine 112 MCG tablet Commonly known as:  SYNTHROID, LEVOTHROID Take 112 mcg by mouth daily.  loratadine 10 MG tablet Commonly known as:  CLARITIN Take 10 mg by mouth daily.  lovastatin 40 MG tablet Commonly known as:  MEVACOR Take 20 mg by mouth every evening.  Melatonin 5 MG Tabs Take 2 tablets by mouth at bedtime.  nystatin ointment Commonly known as:  MYCOSTATIN Apply 1 application topically 2 (two) times daily. Apply to affected area (groin, abdominal fold).  pantoprazole 40 MG tablet Commonly known as:  PROTONIX Take 1 tablet (40 mg total) by mouth daily.  risperidone 4 MG tablet Commonly known as:  RISPERDAL Take 2-4 mg by mouth 2 (two) times daily. Pt takes 4 mg in the morning and 2 mg at bedtime.  sertraline 50 MG tablet Commonly known as:  ZOLOFT Take 50 mg by mouth daily.  sevelamer carbonate 800 MG tablet Commonly known as:  RENVELA Take 2,400 mg by mouth 2 (two) times daily.   topiramate 200 MG tablet Commonly known as:  TOPAMAX Take 200 mg by mouth 2 (two) times daily. Pt takes with a 50 mg tablet.  topiramate 50 MG tablet Commonly known as:  TOPAMAX Take 50 mg by mouth 2 (two) times daily. Pt takes with a 200 mg tablet.  Vitamin D (Ergocalciferol) 50000 units Caps capsule Commonly known as:  DRISDOL Take 50,000 Units by mouth every 30 (thirty) days. Takes on the 8th of every month       Relevant Imaging Results:  Relevant Lab Results:   Additional Information HD M,W,F, [place], [place]  [place]ita [place], [place] [phone]

## 2016-06-30 NOTE — Progress Notes (Signed)
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 Admitted for hypoglycemia.  Assessment/Plan: 1. Hypoglycemia. Resolved. No further issues. Likely due to ESRD/malnutrition. Was not [place] diabetic medications prior to admission.  2. Acute metabolic encephalopathy. Resolved. Likely combination of hypoglycemia, uremia, possible effects of narcotics although this seems less likely .   3. ESRD. Somewhat noncompliant. 4. Anemia of end-stage renal disease. Remains stable. 5. 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 Continue hormone replacement including Synthroid and hydrocortisone. 6. Fungal rash. Wound care placed an interdry silver-impregnated fabric. This should remain in place for 5 days.   7. Legally blind.  8. Per chart non-compliance with care, self-limits dialysis. Mother and primary caretaker died early this year 2017.  749. PMH Bipolar, anxiety, epilepsy, narcolepsy, CVA 2002 with resultant seizures. 10. Chronic pain, chronic headaches treated with hydromorphone orally. Narcotics database reviewed. 11. Tobacco use disorder.   Overall improved. No recurrent hypoglycemia.  Resume medications for chronic pain.   Return to assisted living today.   Brendia Sacksaniel Goodrich, MD  [facility] Direct contact: [phone] --Via amion app OR  --[email or web address]; password [email or web address]  7PM-7AM contact night coverage as above 06/30/2016, 7:15 AM  LOS: 3 days   Consultants:  Palliative care   Nephrology   Wound care   Procedures:  HD  Antimicrobials:  None   HPI/Subjective: Cut dialysis short again by 15 minutes. Patient has no pain at this point and seems to be eating well,  breathing well.   Objective: Vitals:   06/29/16 1746 06/29/16 1951 06/30/16 0211 06/30/16 0515  BP: (!) 166/72 (!) 158/68 (!) 171/64 (!) 142/86  Pulse: 64 65 (!) 57 63  Resp: 18 15 15 18   Temp: 98.7 F (37.1 C) 99.4 F (37.4 C) 98.5 F (36.9 C) 98.3 F (36.8 C)  TempSrc: Oral Oral Oral Oral  SpO2: 100% 98% 96% 99%  Weight: 77.6 kg (171 lb 1.6 oz)   73.5 kg (162 lb 1.6 oz)  Height:        Intake/Output Summary (Last 24 hours) at 06/30/16 0715 Last data filed at 06/30/16 0158  Gross per 24 hour  Intake              480 ml  Output             1623 ml  Net            -1143 ml     Filed Weights   06/29/16 1330 06/29/16 1746 06/30/16 0515  Weight: 77.8 kg (171 lb 8.3 oz) 77.6 kg (171 lb 1.6 oz) 73.5 kg (162 lb 1.6 oz)    Exam: Constitutional:  . Appears calm and comfortable Respiratory:  . CTA bilaterally, no w/r/r.  . Respiratory effort normal. No retractions or accessory muscle use Cardiovascular:  . RRR, no m/r/g . 1-2+ bilateral LE extremity edema, right is more prominent  than the left.  . Normal Bilateral pedal pulses  Psychiatric. Mood and affect appear appropriate.  I have personally reviewed following labs and imaging studies:  CBG is stable.   Scheduled Meds: . conjugated estrogens  1 Applicatorful Vaginal Once per day on Mon Thu  . enoxaparin (LOVENOX) injection  30 mg Subcutaneous Q24H  . epoetin (EPOGEN/PROCRIT) injection  10,000 Units Intravenous Q T,Th,Sa-HD  . fludrocortisone  0.1 mg Oral Daily  . gabapentin  400 mg Oral QHS  . hydrocortisone  10 mg Oral Daily  . hydrocortisone  5 mg Oral QHS  . levothyroxine  112 mcg Oral QAC breakfast  . nicotine  21 mg Transdermal Daily  . nystatin cream   Topical BID  . pantoprazole  40 mg Oral Daily  . polyethylene glycol  17 g Oral Daily  . pravastatin  20 mg Oral q1800  . risperiDONE  2 mg Oral QHS  . risperidone  4 mg Oral q morning - 10a  . sertraline  50 mg Oral Daily  . sevelamer carbonate  2,400  mg Oral BID WC  . topiramate  250 mg Oral BID   Continuous Infusions:   Principal Problem:   Hypoglycemia Active Problems:   Anemia   Bipolar 1 disorder, mixed, moderate (HCC)   ESRD on dialysis (HCC)   Fungal rash of trunk   Seizure disorder (HCC)   Blindness   Altered mental status   Chronic pain   History of CVA (cerebrovascular accident)   Palliative care encounter   Goals of care, counseling/discussion   LOS: 3 days     By signing my name below, I, Cynda Acres, attest that this documentation has been prepared under the direction and in the presence of Brendia Sacks, MD. Electronically signed: Cynda Acres, Scribe. 06/30/16 9:56AM   I personally performed the services described in this documentation. All medical record entries made by the scribe were at my direction. I have reviewed the chart and agree that the record reflects my personal performance and is accurate and complete. Brendia Sacks, MD

## 2016-06-30 NOTE — Clinical Social Work Note (Signed)
Pt d/c today back to [facility]. No bed offers made at this time. Pt and facility aware and agreeable. Facility to provide transport. Pt reports no need to call family.   Derenda Fennel, LCSW [phone]

## 2016-06-30 NOTE — Progress Notes (Signed)
Subjective: Interval History: has no complaint of nausea or vomiting. Patient presently denies any difficulty breathing.  Objective: Vital signs in last 24 hours: Temp:  [98.3 F (36.8 C)-99.7 F (37.6 C)] 98.3 F (36.8 C) (08/31 0515) Pulse Rate:  [57-88] 63 (08/31 0515) Resp:  [15-24] 18 (08/31 0515) BP: (138-190)/(64-98) 142/86 (08/31 0515) SpO2:  [96 %-100 %] 99 % (08/31 0515) Weight:  [73.5 kg (162 lb 1.6 oz)-77.8 kg (171 lb 8.3 oz)] 73.5 kg (162 lb 1.6 oz) (08/31 0515) Weight change: 0.6 kg (1 lb 5.2 oz)  Intake/Output from previous day: 08/30 0701 - 08/31 0700 In: 480 [P.O.:480] Out: 1623 [Urine:2] Intake/Output this shift: Total I/O In: 240 [P.O.:240] Out: -   General appearance: alert, cooperative and no distress Resp: clear to auscultation bilaterally Cardio: regular rate and rhythm Extremities: edema She has trace to 1+ pedal edema  Lab Results:  Recent Labs  06/28/16 0421 06/29/16 0546  WBC 9.4 8.6  HGB 8.9* 7.8*  HCT 28.0* 24.1*  PLT 176 162   BMET:  Recent Labs  06/28/16 0421 06/29/16 0545  NA 140 136  K 4.7 3.4*  CL 109 101  CO2 18* 22  GLUCOSE 95 95  BUN 65* 42*  CREATININE 6.78* 4.87*  CALCIUM 7.4* 6.7*   No results for input(s): PTH in the last 72 hours. Iron Studies: No results for input(s): IRON, TIBC, TRANSFERRIN, FERRITIN in the last 72 hours.  Studies/Results: No results found.  I have reviewed the patient's current medications.  Assessment/Plan: Problem #1 end-stage renal disease: She is status post hemodialysis yesterday. Presently she doesn't have any nausea or vomiting. Problem #2 anemia: Her hemoglobin is low. Presently she is on Epogen. Problem #3 metabolic bone disease: Her calcium and phosphorus is range Problem #4 fluid management: Presently patient doesn't have significant signs of fluid overload Problem #5 history of CVA Problem #6 history of DVT Plan:1] The patient doesn't require dialysis today 2]Her regular  scheduled visit tomorrow and if she is discharged she'll go to her regular units.   LOS: 3 days   Rilen Shukla S 06/30/2016,8:44 AM   He

## 2016-06-30 NOTE — NC FL2 (Deleted)
[place] MEDICAID FL2 LEVEL OF CARE SCREENING TOOL     IDENTIFICATION  Patient Name: Erin [name] Birthdate: 04/08/1971 Sex: female Admission Date (Current Location): 06/27/2016  [place]  [place] [id number] S Facility and Address:  [facility],  [address], [place] [id number]      Provider Number: [id number]  Attending Physician Name and Address:  Standley Brookinganiel P Goodrich, MD  Relative Name and Phone Number:       Current Level of Care: Hospital Recommended Level of Care[facility] Prior Approval Number:    Date Approved/Denied:   PASRR Number: [id number]  Discharge Plan: Domiciliary (Rest home)    Current Diagnoses: Patient Active Problem List   Diagnosis Date Noted  . Palliative care encounter   . Goals of care, counseling/discussion   . Hypoglycemia 06/27/2016  . Fungal rash of trunk 06/27/2016  . Seizure disorder (HCC) 06/27/2016  . Blindness 06/27/2016  . Altered mental status 06/27/2016  . Chronic pain 06/27/2016  . History of CVA (cerebrovascular accident) 06/27/2016  . GI bleed 03/04/2016  . Generalized weakness 03/04/2016  . Hypothyroidism 03/04/2016  . GERD (gastroesophageal reflux disease) 03/04/2016  . ESRD on dialysis (HCC) 03/04/2016  . Bipolar 1 disorder, mixed, moderate (HCC) 02/26/2016  . 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 Anemia 02/25/2016    Orientation RESPIRATION BLADDER Height & Weight     Self, Time, Situation, Place  Normal Continent Weight: 162 lb 1.6 oz (73.5 kg) Height:  4\' 10"  (147.3 cm)  BEHAVIORAL SYMPTOMS/MOOD NEUROLOGICAL BOWEL NUTRITION STATUS      Continent Diet (Diet renal with fluid restriction. Fluid restriction: 1200 mL fluid)  AMBULATORY STATUS COMMUNICATION OF NEEDS Skin   Limited Assist Verbally Bruising; Abrasion to head. Blister to right finger. Rash to abdomen. Moisture  associated skin damage to perineum. Intertriginous dermatitis                        Personal Care Assistance Level of Assistance  Bathing, Feeding, Dressing Bathing Assistance: Limited assistance Feeding assistance: Limited assistance Dressing Assistance: Limited assistance     Functional Limitations Info  Sight, Hearing, Speech Sight Info: Impaired (Patient is blind) Hearing Info: Adequate Speech Info: Adequate    SPECIAL CARE FACTORS FREQUENCY                       Contractures      Additional Factors Info  Code Status, Allergies, Psychotropic Code Status Info: Full Code Allergies Info: Dhea Nutritional Supplements; Demerol, Floxin, Nsaids, Nubain, Phenergan, Stadol, Erythromycin Psychotropic Info: Neuronting, Risperdal, Zoloft, Topamax         Current Medications (06/30/2016):  This is the current hospital active medication list Current Facility-Administered Medications  Medication Dose Route Frequency Provider Last Rate Last Dose  . 0.9 %  sodium chloride infusion  250 mL Intravenous PRN Delano Metzobert Schertz, MD      . 0.9 %  sodium chloride infusion  100 mL Intravenous PRN Salomon MastBelayenh Befekadu, MD      . 0.9 %  sodium chloride infusion  100 mL Intravenous PRN Salomon MastBelayenh Befekadu, MD      . acetaminophen (TYLENOL) tablet 650 mg  650 mg Oral Q6H PRN Delano Metzobert Schertz, MD   650 mg at 06/29/16 2343   Or  . acetaminophen (TYLENOL) suppository 650 mg  650 mg Rectal Q6H PRN Delano Metzobert Schertz, MD      .  conjugated estrogens (PREMARIN) vaginal cream 1 Applicatorful  1 Applicatorful Vaginal Once per day on Mon Thu Erick Blinks, MD      . enoxaparin (LOVENOX) injection 30 mg  30 mg Subcutaneous Q24H Delano Metz, MD      . epoetin alfa (EPOGEN,PROCRIT) injection 10,000 Units  10,000 Units Intravenous Q T,Th,Sa-HD Salomon Mast, MD   10,000 Units at 06/29/16 1521  . fludrocortisone (FLORINEF) tablet 0.1 mg  0.1 mg Oral Daily Erick Blinks, MD   0.1 mg at 06/30/16 0818  .  gabapentin (NEURONTIN) capsule 400 mg  400 mg Oral QHS Erick Blinks, MD   400 mg at 06/29/16 2155  . guaiFENesin-dextromethorphan (ROBITUSSIN DM) 100-10 MG/5ML syrup 5 mL  5 mL Oral Q6H PRN Roma Kayser Schorr, NP      . hydrocortisone (CORTEF) tablet 10 mg  10 mg Oral Daily Erick Blinks, MD   10 mg at 06/30/16 0817  . hydrocortisone (CORTEF) tablet 5 mg  5 mg Oral QHS Erick Blinks, MD   5 mg at 06/29/16 2155  . HYDROmorphone (DILAUDID) tablet 6 mg  6 mg Oral Q8H PRN Roma Kayser Schorr, NP   6 mg at 06/30/16 1145  . levothyroxine (SYNTHROID, LEVOTHROID) tablet 112 mcg  112 mcg Oral QAC breakfast Delano Metz, MD   112 mcg at 06/30/16 0818  . lidocaine (PF) (XYLOCAINE) 1 % injection 5 mL  5 mL Intradermal PRN Salomon Mast, MD      . lidocaine-prilocaine (EMLA) cream 1 application  1 application Topical PRN Salomon Mast, MD      . nicotine (NICODERM CQ - dosed in mg/24 hours) patch 21 mg  21 mg Transdermal Daily Erick Blinks, MD   21 mg at 06/30/16 0818  . nystatin cream (MYCOSTATIN)   Topical BID Delano Metz, MD      . pantoprazole (PROTONIX) EC tablet 40 mg  40 mg Oral Daily Erick Blinks, MD   40 mg at 06/30/16 0818  . pentafluoroprop-tetrafluoroeth (GEBAUERS) aerosol 1 application  1 application Topical PRN Salomon Mast, MD      . polyethylene glycol (MIRALAX / GLYCOLAX) packet 17 g  17 g Oral Daily Erick Blinks, MD   17 g at 06/30/16 1000  . pravastatin (PRAVACHOL) tablet 20 mg  20 mg Oral q1800 Erick Blinks, MD   20 mg at 06/28/16 1701  . risperiDONE (RISPERDAL) tablet 2 mg  2 mg Oral QHS Delano Metz, MD   2 mg at 06/29/16 2155  . risperiDONE (RISPERDAL) tablet 4 mg  4 mg Oral q morning - 10a Delano Metz, MD   4 mg at 06/30/16 0818  . sertraline (ZOLOFT) tablet 50 mg  50 mg Oral Daily Erick Blinks, MD   50 mg at 06/30/16 0817  . sevelamer carbonate (RENVELA) tablet 2,400 mg  2,400 mg Oral BID WC Erick Blinks, MD   2,400 mg at 06/30/16 0818  . sodium  chloride flush (NS) 0.9 % injection 3 mL  3 mL Intravenous PRN Delano Metz, MD      . topiramate (TOPAMAX) tablet 250 mg  250 mg Oral BID Erick Blinks, MD   250 mg at 06/30/16 1610     Discharge Medications: Medication List    TAKE these medications   estradiol 0.1 MG/GM vaginal cream Commonly known as:  ESTRACE Place 1 Applicatorful vaginally 2 (two) times a week. Mondays and Thursdays  fludrocortisone 0.1 MG tablet Commonly known as:  FLORINEF Take 0.1 mg by mouth daily.  furosemide 40 MG tablet  Commonly known as:  LASIX Take 40 mg by mouth 2 (two) times daily.  gabapentin 100 MG capsule Commonly known as:  NEURONTIN Take 400 mg by mouth at bedtime.  hydrocortisone 10 MG tablet Commonly known as:  CORTEF Take 5-10 mg by mouth 2 (two) times daily. Pt takes 10 mg in the morning and 5 mg at bedtime.  HYDROmorphone 4 MG tablet Commonly known as:  DILAUDID Take 1.5 tablets (6 mg total) by mouth every 8 (eight) hours as needed for moderate pain or severe pain.  levothyroxine 112 MCG tablet Commonly known as:  SYNTHROID, LEVOTHROID Take 112 mcg by mouth daily.  loratadine 10 MG tablet Commonly known as:  CLARITIN Take 10 mg by mouth daily.  lovastatin 40 MG tablet Commonly known as:  MEVACOR Take 20 mg by mouth every evening.  Melatonin 5 MG Tabs Take 2 tablets by mouth at bedtime.  pantoprazole 40 MG tablet Commonly known as:  PROTONIX Take 1 tablet (40 mg total) by mouth daily.  risperidone 4 MG tablet Commonly known as:  RISPERDAL Take 2-4 mg by mouth 2 (two) times daily. Pt takes 4 mg in the morning and 2 mg at bedtime.  sertraline 50 MG tablet Commonly known as:  ZOLOFT Take 50 mg by mouth daily.  sevelamer carbonate 800 MG tablet Commonly known as:  RENVELA Take 2,400 mg by mouth 2 (two) times daily.  topiramate 200 MG tablet Commonly known as:  TOPAMAX Take 200 mg by mouth 2 (two) times daily. Pt takes with a 50 mg tablet.  topiramate 50 MG  tablet Commonly known as:  TOPAMAX Take 50 mg by mouth 2 (two) times daily. Pt takes with a 200 mg tablet.  Vitamin D (Ergocalciferol) 50000 units Caps capsule Commonly known as:  DRISDOL Take 50,000 Units by mouth every 30 (thirty) days. Takes on the 8th of every month     Relevant Imaging Results:  Relevant Lab Results:   Additional Information HD M,W,F, [place], [place] [phone]

## 2016-06-30 NOTE — Progress Notes (Signed)
Discharged PT per MD order and protocol. Discharge handouts reviewed/explained. Education completed.  Pt verbalized understanding and left with all belongings. VSS. IV catheter D/C.  Prescriptions were given and explained to pt.  Patient wheeled down by staff member.  Called to give report to [place] at [facility] and stated she was on the way to pick pt up. Got patient dressed and notified shirt was cut at sleeves and asked me to cut sleeves. Lesly Dukes, RN

## 2016-06-30 NOTE — Progress Notes (Signed)
Patient is complaining of pain that has been unrelieved by 4mg  PO Dilaudid, given at 2010. Hospitilist paged, orders for 2mg  PO dilaudid now and order for prn changed to 6mg  PO Dilaudid, q6 hrs.

## 2016-06-30 NOTE — Discharge Instructions (Signed)
Blood Glucose Monitoring, Adult ° °Monitoring your blood glucose (also know as blood sugar) helps you to manage your diabetes. It also helps you and your health care provider monitor your diabetes and determine how well your treatment plan is working. °WHY SHOULD YOU MONITOR YOUR BLOOD GLUCOSE? °· It can help you understand how food, exercise, and medicine affect your blood glucose. °· It allows you to know what your blood glucose is at any given moment. You can quickly tell if you are having low blood glucose (hypoglycemia) or high blood glucose (hyperglycemia). °· It can help you and your health care provider know how to adjust your medicines. °· It can help you understand how to manage an illness or adjust medicine for exercise. °WHEN SHOULD YOU TEST? °Your health care provider will help you decide how often you should check your blood glucose. This may depend on the type of diabetes you have, your diabetes control, or the types of medicines you are taking. Be sure to write down all of your blood glucose readings so that this information can be reviewed with your health care provider. See below for examples of testing times that your health care provider may suggest. °Type 1 Diabetes °· Test at least 2 times per day if your diabetes is well controlled, if you are using an insulin pump, or if you perform multiple daily injections. °· If your diabetes is not well controlled or if you are sick, you may need to test more often. °· It is a good idea to also test: °¨ Before every insulin injection. °¨ Before and after exercise. °¨ Between meals and 2 hours after a meal. °¨ Occasionally between 2:00 a.m. and 3:00 a.m. °Type 2 Diabetes °· If you are taking insulin, test at least 2 times per day. However, it is best to test before every insulin injection. °· If you take medicines by mouth (orally), test 2 times a day. °· If you are on a controlled diet, test once a day. °· If your diabetes is not well controlled or if you  are sick, you may need to monitor more often. °HOW TO MONITOR YOUR BLOOD GLUCOSE °Supplies Needed °· Blood glucose meter. °· Test strips for your meter. Each meter has its own strips. You must use the strips that go with your own meter. °· A pricking needle (lancet). °· A device that holds the lancet (lancing device). °· A journal or log book to write down your results. °Procedure °· Wash your hands with soap and water. Alcohol is not preferred. °· Prick the side of your finger (not the tip) with the lancet. °· Gently milk the finger until a small drop of blood appears. °· Follow the instructions that come with your meter for inserting the test strip, applying blood to the strip, and using your blood glucose meter. °Other Areas to Get Blood for Testing °Some meters allow you to use other areas of your body (other than your finger) to test your blood. These areas are called alternative sites. The most common alternative sites are: °· The forearm. °· The thigh. °· The back area of the lower leg. °· The palm of the hand. °The blood flow in these areas is slower. Therefore, the blood glucose values you get may be delayed, and the numbers are different from what you would get from your fingers. Do not use alternative sites if you think you are having hypoglycemia. Your reading will not be accurate. Always use a finger if you   are having hypoglycemia. Also, if you cannot feel your lows (hypoglycemia unawareness), always use your fingers for your blood glucose checks. °ADDITIONAL TIPS FOR GLUCOSE MONITORING °· Do not reuse lancets. °· Always carry your supplies with you. °· All blood glucose meters have a 24-hour "hotline" number to call if you have questions or need help. °· Adjust (calibrate) your blood glucose meter with a control solution after finishing a few boxes of strips. °BLOOD GLUCOSE RECORD KEEPING °It is a good idea to keep a daily record or log of your blood glucose readings. Most glucose meters, if not all,  keep your glucose records stored in the meter. Some meters come with the ability to download your records to your home computer. Keeping a record of your blood glucose readings is especially helpful if you are wanting to look for patterns. Make notes to go along with the blood glucose readings because you might forget what happened at that exact time. Keeping good records helps you and your health care provider to work together to achieve good diabetes management.  °  °This information is not intended to replace advice given to you by your health care provider. Make sure you discuss any questions you have with your health care provider. °  °Document Released: 10/20/2003 Document Revised: 11/07/2014 Document Reviewed: 03/11/2013 °Elsevier Interactive Patient Education ©2016 [employer] ° °

## 2016-07-04 ENCOUNTER — Emergency Department (HOSPITAL_COMMUNITY)
Admission: EM | Admit: 2016-07-04 | Discharge: 2016-07-04 | Disposition: A | Discharge status: Home / Self Care | Payer: Medicare Other | Attending: Emergency Medicine | Admitting: Emergency Medicine

## 2016-07-04 ENCOUNTER — Emergency Department (HOSPITAL_COMMUNITY): Payer: Medicare Other

## 2016-07-04 ENCOUNTER — Encounter (HOSPITAL_COMMUNITY): Payer: Self-pay | Admitting: *Deleted

## 2016-07-04 DIAGNOSIS — K5900 Constipation, unspecified: Secondary | ICD-10-CM | POA: Diagnosis not present

## 2016-07-04 DIAGNOSIS — J45909 Unspecified asthma, uncomplicated: Secondary | ICD-10-CM | POA: Insufficient documentation

## 2016-07-04 DIAGNOSIS — Z79899 Other long term (current) drug therapy: Secondary | ICD-10-CM | POA: Insufficient documentation

## 2016-07-04 DIAGNOSIS — F17210 Nicotine dependence, cigarettes, uncomplicated: Secondary | ICD-10-CM | POA: Diagnosis not present

## 2016-07-04 DIAGNOSIS — R531 Weakness: Secondary | ICD-10-CM | POA: Diagnosis present

## 2016-07-04 DIAGNOSIS — R1030 Lower abdominal pain, unspecified: Secondary | ICD-10-CM | POA: Diagnosis not present

## 2016-07-04 DIAGNOSIS — N19 Unspecified kidney failure: Secondary | ICD-10-CM | POA: Insufficient documentation

## 2016-07-04 DIAGNOSIS — E039 Hypothyroidism, unspecified: Secondary | ICD-10-CM | POA: Diagnosis not present

## 2016-07-04 LAB — CBC WITH DIFFERENTIAL/PLATELET
BAND NEUTROPHILS: 0 %
BLASTS: 0 %
Basophils Absolute: 0 10*3/uL (ref 0.0–0.1)
Basophils Relative: 0 %
EOS PCT: 0 %
Eosinophils Absolute: 0 10*3/uL (ref 0.0–0.7)
HCT: 26 % — ABNORMAL LOW (ref 36.0–46.0)
Hemoglobin: 8.3 g/dL — ABNORMAL LOW (ref 12.0–15.0)
LYMPHS PCT: 7 %
Lymphs Abs: 0.7 10*3/uL (ref 0.7–4.0)
MCH: 31.9 pg (ref 26.0–34.0)
MCHC: 31.9 g/dL (ref 30.0–36.0)
MCV: 100 fL (ref 78.0–100.0)
MONOS PCT: 14 %
Metamyelocytes Relative: 0 %
Monocytes Absolute: 1.4 10*3/uL — ABNORMAL HIGH (ref 0.1–1.0)
Myelocytes: 0 %
NEUTROS ABS: 8.2 10*3/uL — AB (ref 1.7–7.7)
Neutrophils Relative %: 79 %
OTHER: 0 %
PLATELETS: 152 10*3/uL (ref 150–400)
Promyelocytes Absolute: 0 %
RBC: 2.6 MIL/uL — AB (ref 3.87–5.11)
RDW: 20.1 % — AB (ref 11.5–15.5)
WBC: 10.3 10*3/uL (ref 4.0–10.5)
nRBC: 0 /100 WBC

## 2016-07-04 LAB — URINALYSIS, ROUTINE W REFLEX MICROSCOPIC
Bilirubin Urine: NEGATIVE
GLUCOSE, UA: NEGATIVE mg/dL
KETONES UR: NEGATIVE mg/dL
LEUKOCYTES UA: NEGATIVE
NITRITE: NEGATIVE
Specific Gravity, Urine: 1.015 (ref 1.005–1.030)
pH: 7 (ref 5.0–8.0)

## 2016-07-04 LAB — URINE MICROSCOPIC-ADD ON

## 2016-07-04 LAB — COMPREHENSIVE METABOLIC PANEL
ALT: 24 U/L (ref 14–54)
AST: 37 U/L (ref 15–41)
Albumin: 3.3 g/dL — ABNORMAL LOW (ref 3.5–5.0)
Alkaline Phosphatase: 313 U/L — ABNORMAL HIGH (ref 38–126)
Anion gap: 11 (ref 5–15)
BUN: 70 mg/dL — AB (ref 6–20)
CHLORIDE: 105 mmol/L (ref 101–111)
CO2: 20 mmol/L — AB (ref 22–32)
Calcium: 7.6 mg/dL — ABNORMAL LOW (ref 8.9–10.3)
Creatinine, Ser: 6.53 mg/dL — ABNORMAL HIGH (ref 0.44–1.00)
GFR calc Af Amer: 8 mL/min — ABNORMAL LOW (ref >60)
GFR, EST NON AFRICAN AMERICAN: 7 mL/min — AB (ref >60)
Glucose, Bld: 73 mg/dL (ref 65–99)
POTASSIUM: 4.6 mmol/L (ref 3.5–5.1)
SODIUM: 136 mmol/L (ref 135–145)
Total Bilirubin: 0.7 mg/dL (ref 0.3–1.2)
Total Protein: 5.8 g/dL — ABNORMAL LOW (ref 6.5–8.1)

## 2016-07-04 LAB — LIPASE, BLOOD: LIPASE: 41 U/L (ref 11–51)

## 2016-07-04 MED ORDER — POLYETHYLENE GLYCOL 3350 17 GM/SCOOP PO POWD: ORAL | 0 refills | Status: DC

## 2016-07-04 MED ORDER — DIATRIZOATE MEGLUMINE & SODIUM 66-10 % PO SOLN
ORAL | Status: AC
  Filled 2016-07-04: qty 30

## 2016-07-04 NOTE — Discharge Instructions (Signed)
Get miralax and put one dose or 17 g in 8 ounces of water,  take 1 dose every 30 minutes for 2-3 hours or until you  get good results and then once or twice daily to prevent constipation.  HAPPY BIRTHDAY!!!!

## 2016-07-04 NOTE — ED Triage Notes (Signed)
Pt reports that she got up to go to the bathroom and she felt week. Pt's CBG on scene was 92. Pt also c/o lower abdominal pain.

## 2016-07-04 NOTE — ED Notes (Signed)
Maren Reamer [phone] per patient is the lady to contact to get her home. This RN called this number and it went to voicemail but the voicemail was full and could not accept any messages.

## 2016-07-04 NOTE — ED Notes (Signed)
Called home again. Erin [name] stated she is on way. Will be about 30 minutes. Pt resting. Breakfast tray given

## 2016-07-04 NOTE — ED Notes (Signed)
Pt ate a strawberry roll and drank some drink with no problems or complaints of belly pain.

## 2016-07-04 NOTE — ED Notes (Signed)
Erin [name] states that she will come pick pt up after she takes a shower. She will be here in about 30-45 mins. Erin [name] states that this pt has been given her 30 day discharge notice from [facility] in [place] due to her non-compliance with her dialysis and other manipulative situations and place other pt's in this home at risk due to lowering bed rails, etc.

## 2016-07-04 NOTE — ED Provider Notes (Signed)
[facility]-EMERGENCY DEPT Provider Note   CSN: [id number] Arrival date & time: 07/04/16  0319  Time seen 03:55 AM   History   Chief Complaint Chief Complaint  Patient presents with  . Weakness    HPI Erin [name] is a 45 y.o. female.  HPI patient states she awoke her from sleep about 12:30 or 1 AM feeling weak. She also states she had her right forehead headache which is the location that her headaches are always located. She states it's throbbing and aching. She also complains of chronic pain in her bilateral lower legs and ankles and chronic pain in her right hip. Patient is on oral narcotics on a regular basis due to her chronic headaches. She states on the way to the hospital she started getting abdominal pain on her right side. She describes it as a burning pain. She denies dysuria, frequency, diarrhea, nausea, vomiting, or hematuria. She denies being sexually active. She states she's never had this pain before. Patient has end-stage renal disease and goes to dialysis at [facility] [place] on Monday (today), Wednesdays, and Fridays. She denies feeling short of breath or having chest pain.  Past Medical History:  Diagnosis Date  . Anxiety   . Asthma   . Bipolar affect, depressed (HCC)   . Blind   . Chronic headaches    Seeing Pain Management  . Constipation   . Depression   . Epilepsy (HCC)   . Fibromyalgia   . History of CVA (cerebrovascular accident)   . History of DVT (deep vein thrombosis)   . Hyperlipidemia   . Hypothyroid   . Kidney failure    Currently on Dialysis  . Narcolepsy   . Psychosis     Patient Active Problem List   Diagnosis Date Noted  . Kidney failure   . Palliative care encounter   . Goals of care, counseling/discussion   . Hypoglycemia 06/27/2016  . Fungal rash of trunk 06/27/2016  . Seizure disorder (HCC) 06/27/2016  . Blindness 06/27/2016  . Altered mental status 06/27/2016  . Chronic pain 06/27/2016  . History of CVA (cerebrovascular accident)  06/27/2016  . GI bleed 03/04/2016  . Generalized weakness 03/04/2016  . Hypothyroidism 03/04/2016  . GERD (gastroesophageal reflux disease) 03/04/2016  . End stage renal disease on dialysis (HCC) 03/04/2016  . Bipolar 1 disorder, mixed, moderate (HCC) 02/26/2016  . 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 Anemia 02/25/2016    Past Surgical History:  Procedure Laterality Date  . BRAIN SURGERY  1976  . BREAST LUMPECTOMY  2001  . CHOLECYSTECTOMY  2001  . DG AV DIALYSIS GRAFT DECLOT OR     X 4  . GIVENS CAPSULE STUDY N/A 02/29/2016   Procedure: GIVENS CAPSULE STUDY;  Surgeon: Elnita Maxwell, MD;  Location: [facility] ENDOSCOPY;  Service: Endoscopy;  Laterality: N/A;  . PERIPHERAL VASCULAR CATHETERIZATION N/A 08/21/2015   Procedure: Dialysis/Perma Catheter Insertion;  Surgeon: Renford Dills, MD;  Location: [facility] INVASIVE CV LAB;  Service: Cardiovascular;  Laterality: N/A;  . PERIPHERAL VASCULAR CATHETERIZATION N/A 02/29/2016   Procedure: Dialysis/Perma Catheter Removal;  Surgeon: Annice Needy, MD;  Location: [facility] INVASIVE CV LAB;  Service: Cardiovascular;  Laterality: N/A;  . PITUITARY EXCISION  1976    OB History    No data available       Home Medications    Prior to Admission medications   Medication Sig Start Date End Date Taking? Authorizing Provider  estradiol (ESTRACE) 0.1  MG/GM vaginal cream Place 1 Applicatorful vaginally 2 (two) times a week. Mondays and Thursdays    Historical Provider, MD  fludrocortisone (FLORINEF) 0.1 MG tablet Take 0.1 mg by mouth daily.    Historical Provider, MD  furosemide (LASIX) 40 MG tablet Take 40 mg by mouth 2 (two) times daily.    Historical Provider, MD  gabapentin (NEURONTIN) 100 MG capsule Take 400 mg by mouth at bedtime.    Historical Provider, MD  hydrocortisone (CORTEF) 10 MG tablet Take 5-10 mg by mouth 2 (two) times daily. Pt takes 10 mg in the morning and 5 mg at bedtime.     Historical Provider, MD  HYDROmorphone (DILAUDID) 4 MG tablet Take 1.5 tablets (6 mg total) by mouth every 8 (eight) hours as needed for moderate pain or severe pain. 06/30/16   Standley Brooking, MD  levothyroxine (SYNTHROID, LEVOTHROID) 112 MCG tablet Take 112 mcg by mouth daily.     Historical Provider, MD  loratadine (CLARITIN) 10 MG tablet Take 10 mg by mouth daily.    Historical Provider, MD  lovastatin (MEVACOR) 40 MG tablet Take 20 mg by mouth every evening.     Historical Provider, MD  Melatonin 5 MG TABS Take 2 tablets by mouth at bedtime.    Historical Provider, MD  nystatin ointment (MYCOSTATIN) Apply 1 application topically 2 (two) times daily. Apply to affected area (groin, abdominal fold). 06/30/16   Standley Brooking, MD  pantoprazole (PROTONIX) 40 MG tablet Take 1 tablet (40 mg total) by mouth daily. 02/27/16   [name], MD  polyethylene glycol powder (MIRALAX) powder Mix one dose or 17 g in 8 ounces of water,  take 1 dose every 30 minutes for 2-3 hours or until you  get good results and then once or twice daily to prevent constipation. 07/04/16   Devoria Albe, MD  risperidone (RISPERDAL) 4 MG tablet Take 2-4 mg by mouth 2 (two) times daily. Pt takes 4 mg in the morning and 2 mg at bedtime.    Historical Provider, MD  sertraline (ZOLOFT) 50 MG tablet Take 50 mg by mouth daily.    Historical Provider, MD  sevelamer carbonate (RENVELA) 800 MG tablet Take 2,400 mg by mouth 2 (two) times daily.     Historical Provider, MD  topiramate (TOPAMAX) 200 MG tablet Take 200 mg by mouth 2 (two) times daily. Pt takes with a 50 mg tablet.    Historical Provider, MD  topiramate (TOPAMAX) 50 MG tablet Take 50 mg by mouth 2 (two) times daily. Pt takes with a 200 mg tablet.    Historical Provider, MD  Vitamin D, Ergocalciferol, (DRISDOL) 50000 UNITS CAPS capsule Take 50,000 Units by mouth every 30 (thirty) days. Takes on the 8th of every month    Historical Provider, MD    Family History Family History    Problem Relation Age of Onset  . Heart attack Mother   . Migraines Mother   . Aneurysm Mother   . Diabetes Father     Social History Social History  Substance Use Topics  . Smoking status: Current Every Day Smoker    Packs/day: 0.50    Years: 16.00    Types: Cigarettes  . Smokeless tobacco: Never Used  . Alcohol use No     Comment: occasionally  lives in a group home Smokes 1/4 ppd  Allergies   Dhea [nutritional supplements]; Demerol [meperidine]; Floxin [ofloxacin]; Nsaids; Nubain [nalbuphine hcl]; Phenergan [promethazine hcl]; Stadol [butorphanol]; and Erythromycin   Review  of Systems Review of Systems  All other systems reviewed and are negative.    Physical Exam Updated Vital Signs BP 174/82 (BP Location: Left Leg)   Pulse 68   Temp 98 F (36.7 C) (Oral)   Resp 12   Ht 4\' 10"  (1.473 m)   Wt 162 lb (73.5 kg)   LMP 11/21/2007 (Approximate) Comment: No period since 2009,due to"DVT" per pt since they stopped BCP...no periods  SpO2 99%   BMI 33.86 kg/m   Vital signs normal    Physical Exam  Constitutional: She is oriented to person, place, and time. She appears well-developed and well-nourished.  Non-toxic appearance. She does not appear ill. No distress.  Very short in stature  HENT:  Head: Normocephalic and atraumatic.  Right Ear: External ear normal.  Left Ear: External ear normal.  Nose: Nose normal. No mucosal edema or rhinorrhea.  Mouth/Throat: Oropharynx is clear and moist and mucous membranes are normal. No dental abscesses or uvula swelling.  Eyes: Conjunctivae are normal.  Patient is blind, her pupils are nonreactive to light, her eyes are disconjugate  Neck: Normal range of motion and full passive range of motion without pain. Neck supple.  Cardiovascular: Normal rate, regular rhythm and normal heart sounds.  Exam reveals no gallop and no friction rub.   No murmur heard. Pulmonary/Chest: Effort normal and breath sounds normal. No respiratory  distress. She has no wheezes. She has no rhonchi. She has no rales. She exhibits no tenderness and no crepitus.  Abdominal: Soft. Normal appearance and bowel sounds are normal. She exhibits no distension. There is tenderness. There is no rebound and no guarding.    Patient is tender to palpation in her bilateral lower abdomen but not over the suprapubic area.  Musculoskeletal: Normal range of motion. She exhibits no edema or tenderness.  Moves all extremities well.   Neurological: She is alert and oriented to person, place, and time. She has normal strength. No cranial nerve deficit.  Skin: Skin is warm, dry and intact. No rash noted. No erythema. No pallor.  Psychiatric: She has a normal mood and affect. Her speech is normal and behavior is normal. Her mood appears not anxious.  Nursing note and vitals reviewed.    ED Treatments / Results  Labs (all labs ordered are listed, but only abnormal results are displayed) Results for orders placed or performed during the hospital encounter of 07/04/16  Urinalysis, Routine w reflex microscopic  Result Value Ref Range   Color, Urine YELLOW YELLOW   APPearance CLEAR CLEAR   Specific Gravity, Urine 1.015 1.005 - 1.030   pH 7.0 5.0 - 8.0   Glucose, UA NEGATIVE NEGATIVE mg/dL   Hgb urine dipstick SMALL (A) NEGATIVE   Bilirubin Urine NEGATIVE NEGATIVE   Ketones, ur NEGATIVE NEGATIVE mg/dL   Protein, ur >161 (A) NEGATIVE mg/dL   Nitrite NEGATIVE NEGATIVE   Leukocytes, UA NEGATIVE NEGATIVE  Comprehensive metabolic panel  Result Value Ref Range   Sodium 136 135 - 145 mmol/L   Potassium 4.6 3.5 - 5.1 mmol/L   Chloride 105 101 - 111 mmol/L   CO2 20 (L) 22 - 32 mmol/L   Glucose, Bld 73 65 - 99 mg/dL   BUN 70 (H) 6 - 20 mg/dL   Creatinine, Ser 0.96 (H) 0.44 - 1.00 mg/dL   Calcium 7.6 (L) 8.9 - 10.3 mg/dL   Total Protein 5.8 (L) 6.5 - 8.1 g/dL   Albumin 3.3 (L) 3.5 - 5.0 g/dL   AST  37 15 - 41 U/L   ALT 24 14 - 54 U/L   Alkaline Phosphatase 313  (H) 38 - 126 U/L   Total Bilirubin 0.7 0.3 - 1.2 mg/dL   GFR calc non Af Amer 7 (L) >60 mL/min   GFR calc Af Amer 8 (L) >60 mL/min   Anion gap 11 5 - 15  Lipase, blood  Result Value Ref Range   Lipase 41 11 - 51 U/L  CBC with Differential  Result Value Ref Range   WBC 10.3 4.0 - 10.5 K/uL   RBC 2.60 (L) 3.87 - 5.11 MIL/uL   Hemoglobin 8.3 (L) 12.0 - 15.0 g/dL   HCT 47.826.0 (L) 29.536.0 - 62.146.0 %   MCV 100.0 78.0 - 100.0 fL   MCH 31.9 26.0 - 34.0 pg   MCHC 31.9 30.0 - 36.0 g/dL   RDW 30.820.1 (H) 65.711.5 - 84.615.5 %   Platelets 152 150 - 400 K/uL   Neutrophils Relative % 79 %   Lymphocytes Relative 7 %   Monocytes Relative 14 %   Eosinophils Relative 0 %   Basophils Relative 0 %   Band Neutrophils 0 %   Metamyelocytes Relative 0 %   Myelocytes 0 %   Promyelocytes Absolute 0 %   Blasts 0 %   nRBC 0 0 /100 WBC   Other 0 %   Neutro Abs 8.2 (H) 1.7 - 7.7 K/uL   Lymphs Abs 0.7 0.7 - 4.0 K/uL   Monocytes Absolute 1.4 (H) 0.1 - 1.0 K/uL   Eosinophils Absolute 0.0 0.0 - 0.7 K/uL   Basophils Absolute 0.0 0.0 - 0.1 K/uL  Urine microscopic-add on  Result Value Ref Range   Squamous Epithelial / LPF 0-5 (A) NONE SEEN   WBC, UA 0-5 0 - 5 WBC/hpf   RBC / HPF 0-5 0 - 5 RBC/hpf   Bacteria, UA RARE (A) NONE SEEN   Laboratory interpretation all normal except Stable chronic anemia, renal failure, no hyperkalemia, malnutrition    EKG  EKG Interpretation None       Radiology Ct Abdomen Pelvis Wo Contrast  Result Date: 07/04/2016 CLINICAL DATA:  Initial valuation for acute bilateral lower abdominal pain. EXAM: CT ABDOMEN AND PELVIS WITHOUT CONTRAST TECHNIQUE: Multidetector CT imaging of the abdomen and pelvis was performed following the standard protocol without IV contrast. COMPARISON:  Prior CT from 01/12/2013. FINDINGS: Patchy multi focal nodular densities present within the partially visualized right middle lobe. These measure up to 7 mm. Finding suspicious for possible infection, and may reflect  atypical lung infection. Visualized lungs are otherwise clear. Decreased density within the cardiac blood pool suggestive of anemia. Limited noncontrast evaluation of the liver is unremarkable. Gallbladder surgically absent. No biliary dilatation. Spleen, adrenal glands, and pancreas demonstrate a normal unenhanced appearance. Kidneys are small and atrophic bilaterally. No nephrolithiasis or hydronephrosis. Stomach within normal limits. No evidence for bowel obstruction. Appendix well visualized in the right lower quadrant and is of normal caliber and appearance without associated inflammatory changes to suggest acute appendicitis. No abnormal wall thickening or inflammatory fat stranding seen elsewhere about the bowels. Moderate amount of retained stool diffusely throughout the colon. Bladder partially distended without acute abnormality. Uterus and ovaries unremarkable. No free air or fluid. No pathologically enlarged intra-abdominal or pelvic lymph nodes. Small fat containing paraumbilical hernia noted. Diffuse soft tissue stranding throughout the subcutaneous fat, most compatible with anasarca. No acute osseous abnormality. No worrisome lytic or blastic osseous lesions. Bones are somewhat osteopenic in  appearance. The IMPRESSION: 1. No acute intra-abdominal or pelvic process identified. 2. Patchy multifocal nodular densities within the partially visualized right middle lobe, suspicious for infectious pneumonitis. Atypical lung infection could be considered given the distribution and appearance. 3. Anasarca.  No intra-abdominal ascites identified. 4. Moderate diffuse stool throughout the colon, which may reflect constipation. Electronically Signed   By: Rise Mu M.D.   On: 07/04/2016 05:28    Procedures Procedures (including critical care time)  Medications Ordered in ED Medications  diatrizoate meglumine-sodium (GASTROGRAFIN) 66-10 % solution (not administered)     Initial Impression /  Assessment and Plan / ED Course  I have reviewed the triage vital signs and the nursing notes.  Pertinent labs & imaging results that were available during my care of the patient were reviewed by me and considered in my medical decision making (see chart for details).  Clinical Course   I was going to order CT scan without IV contrast due to inability to get a good IV however patient also refused to take the oral contrast because it tasted bad. Therefore she just had a AP CT without oral or IV contrast.   Patient has been eating rolls in the ED. Evidently her birthday was on [birth year] and no one acknowledged her birthday at her group home. The staff gave her the rolls and sang Happy [place] Ouch to her. She is in no distress. Her labs are as expected with someone with chronic renal failure, without hyperkalemia being present. She is to have dialysis at noon today. Patient is being discharged to her facility and she can go to dialysis today.  Final Clinical Impressions(s) / ED Diagnoses   Final diagnoses:  Lower abdominal pain  Constipation, unspecified constipation type    New Prescriptions New Prescriptions   POLYETHYLENE GLYCOL POWDER (MIRALAX) POWDER    Mix one dose or 17 g in 8 ounces of water,  take 1 dose every 30 minutes for 2-3 hours or until you  get good results and then once or twice daily to prevent constipation.    Plan discharge  Devoria Albe, MD, Concha Pyo, MD 07/04/16 [phone]

## 2016-07-04 NOTE — ED Notes (Addendum)
Erin [name] from home care called to advise of pt d/c again and was given d/c instructions over phone. Wanda verbalized undersdtanding.

## 2016-08-31 ENCOUNTER — Emergency Department
Admission: EM | Admit: 2016-08-31 | Discharge: 2016-08-31 | Disposition: A | Discharge status: Home / Self Care | Payer: Medicare Other | Attending: Emergency Medicine | Admitting: Emergency Medicine

## 2016-08-31 ENCOUNTER — Encounter: Payer: Self-pay | Admitting: Emergency Medicine

## 2016-08-31 DIAGNOSIS — Z992 Dependence on renal dialysis: Secondary | ICD-10-CM | POA: Diagnosis not present

## 2016-08-31 DIAGNOSIS — R531 Weakness: Secondary | ICD-10-CM | POA: Diagnosis not present

## 2016-08-31 DIAGNOSIS — F17210 Nicotine dependence, cigarettes, uncomplicated: Secondary | ICD-10-CM | POA: Insufficient documentation

## 2016-08-31 DIAGNOSIS — J45909 Unspecified asthma, uncomplicated: Secondary | ICD-10-CM | POA: Insufficient documentation

## 2016-08-31 DIAGNOSIS — E039 Hypothyroidism, unspecified: Secondary | ICD-10-CM | POA: Diagnosis not present

## 2016-08-31 DIAGNOSIS — G8929 Other chronic pain: Secondary | ICD-10-CM | POA: Diagnosis present

## 2016-08-31 DIAGNOSIS — Z79899 Other long term (current) drug therapy: Secondary | ICD-10-CM | POA: Diagnosis not present

## 2016-08-31 DIAGNOSIS — N186 End stage renal disease: Secondary | ICD-10-CM | POA: Diagnosis not present

## 2016-08-31 DIAGNOSIS — R5383 Other fatigue: Secondary | ICD-10-CM | POA: Diagnosis not present

## 2016-08-31 LAB — BASIC METABOLIC PANEL
ANION GAP: 13 (ref 5–15)
BUN: 69 mg/dL — ABNORMAL HIGH (ref 6–20)
CO2: 21 mmol/L — AB (ref 22–32)
Calcium: 6.8 mg/dL — ABNORMAL LOW (ref 8.9–10.3)
Chloride: 103 mmol/L (ref 101–111)
Creatinine, Ser: 6.55 mg/dL — ABNORMAL HIGH (ref 0.44–1.00)
GFR calc non Af Amer: 7 mL/min — ABNORMAL LOW (ref >60)
GFR, EST AFRICAN AMERICAN: 8 mL/min — AB (ref >60)
GLUCOSE: 51 mg/dL — AB (ref 65–99)
POTASSIUM: 4.8 mmol/L (ref 3.5–5.1)
Sodium: 137 mmol/L (ref 135–145)

## 2016-08-31 LAB — URINALYSIS COMPLETE WITH MICROSCOPIC (ARMC ONLY)
BACTERIA UA: NONE SEEN
BILIRUBIN URINE: NEGATIVE
Glucose, UA: NEGATIVE mg/dL
HGB URINE DIPSTICK: NEGATIVE
Ketones, ur: NEGATIVE mg/dL
LEUKOCYTES UA: NEGATIVE
NITRITE: NEGATIVE
PH: 8 (ref 5.0–8.0)
Protein, ur: 100 mg/dL — AB
SPECIFIC GRAVITY, URINE: 1.006 (ref 1.005–1.030)

## 2016-08-31 LAB — CBC WITH DIFFERENTIAL/PLATELET
BASOS ABS: 0.1 10*3/uL (ref 0–0.1)
Basophils Relative: 1 %
Eosinophils Absolute: 0.2 10*3/uL (ref 0–0.7)
Eosinophils Relative: 2 %
HEMATOCRIT: 22.8 % — AB (ref 35.0–47.0)
HEMOGLOBIN: 7.4 g/dL — AB (ref 12.0–16.0)
LYMPHS PCT: 21 %
Lymphs Abs: 1.4 10*3/uL (ref 1.0–3.6)
MCH: 31.6 pg (ref 26.0–34.0)
MCHC: 32.5 g/dL (ref 32.0–36.0)
MCV: 97.2 fL (ref 80.0–100.0)
Monocytes Absolute: 0.5 10*3/uL (ref 0.2–0.9)
Monocytes Relative: 7 %
NEUTROS ABS: 4.8 10*3/uL (ref 1.4–6.5)
NEUTROS PCT: 69 %
Platelets: 138 10*3/uL — ABNORMAL LOW (ref 150–440)
RBC: 2.34 MIL/uL — AB (ref 3.80–5.20)
RDW: 18.4 % — ABNORMAL HIGH (ref 11.5–14.5)
WBC: 6.9 10*3/uL (ref 3.6–11.0)

## 2016-08-31 LAB — TROPONIN I: Troponin I: <0.03 ng/mL (ref <0.03)

## 2016-08-31 NOTE — Discharge Instructions (Signed)
Please seek medical attention for any high fevers, chest pain, shortness of breath, change in behavior, persistent vomiting, bloody stool or any other new or concerning symptoms.  

## 2016-08-31 NOTE — ED Notes (Signed)
Spoke with Nicholes Stairs, pt's Care Coordinator via [facility].  She is unable to provide transportation at this time; she suggests providing a taxi for pt to get back to [facility] [facility] [employer].

## 2016-08-31 NOTE — ED Provider Notes (Signed)
[facility] Emergency Department Provider Note  ____________________________________________   I have reviewed the triage vital signs and the nursing notes.   HISTORY  Chief Complaint They think I overdosed on my pain medication  History limited by: Not Limited   HPI Erin [name] is a 45 y.o. female who presents to the emergency department today because of apparent concern for possible pain medication overdose. Patient states that she is on dilaudid for chronic pain. Says she took 1.5 pills today which is her normal amount. She denies feeling any different then her normal state of health. Did undergo her normal course of dialysis yesterday. Denies any chest pain, shortness of breath, fever.   Past Medical History:  Diagnosis Date  . Anxiety   . Asthma   . Bipolar affect, depressed (HCC)   . Blind   . Chronic headaches    Seeing Pain Management  . Constipation   . Depression   . Epilepsy (HCC)   . Fibromyalgia   . History of CVA (cerebrovascular accident)   . History of DVT (deep vein thrombosis)   . Hyperlipidemia   . Hypothyroid   . Kidney failure    Currently on Dialysis  . Narcolepsy   . Psychosis     Patient Active Problem List   Diagnosis Date Noted  . Kidney failure   . Palliative care encounter   . Goals of care, counseling/discussion   . Hypoglycemia 06/27/2016  . Fungal rash of trunk 06/27/2016  . Seizure disorder (HCC) 06/27/2016  . Blindness 06/27/2016  . Altered mental status 06/27/2016  . Chronic pain 06/27/2016  . History of CVA (cerebrovascular accident) 06/27/2016  . GI bleed 03/04/2016  . Generalized weakness 03/04/2016  . Hypothyroidism 03/04/2016  . GERD (gastroesophageal reflux disease) 03/04/2016  . End stage renal disease on dialysis (HCC) 03/04/2016  . Bipolar 1 disorder, mixed, moderate (HCC) 02/26/2016  . 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 Anemia 02/25/2016    Past Surgical History:  Procedure Laterality Date  . BRAIN SURGERY  1976  . BREAST LUMPECTOMY  2001  . CHOLECYSTECTOMY  2001  . DG AV DIALYSIS GRAFT DECLOT OR     X 4  . GIVENS CAPSULE STUDY N/A 02/29/2016   Procedure: GIVENS CAPSULE STUDY;  Surgeon: Elnita MaxwellMatthew Gordon Rein, MD;  [facility];  Service: Endoscopy;  Laterality: N/A;  . PERIPHERAL VASCULAR CATHETERIZATION N/A 08/21/2015   Procedure: Dialysis/Perma Catheter Insertion;  Surgeon: [name], MD;  Location: [facility] INVASIVE CV LAB;  Service: Cardiovascular;  Laterality: N/A;  . PERIPHERAL VASCULAR CATHETERIZATION N/A 02/29/2016   Procedure: Dialysis/Perma Catheter Removal;  Surgeon: [name], MD;  Location: [facility] INVASIVE CV LAB;  Service: Cardiovascular;  Laterality: N/A;  . PITUITARY EXCISION  1976    Prior to Admission medications   Medication Sig Start Date End Date Taking? Authorizing Provider  estradiol (ESTRACE) 0.1 MG/GM vaginal cream Place 1 Applicatorful vaginally 2 (two) times a week. Mondays and Thursdays    Historical Provider, MD  fludrocortisone (FLORINEF) 0.1 MG tablet Take 0.1 mg by mouth daily.    Historical Provider, MD  furosemide (LASIX) 40 MG tablet Take 40 mg by mouth 2 (two) times daily.    Historical Provider, MD  gabapentin (NEURONTIN) 100 MG capsule Take 400 mg by mouth at bedtime.    Historical Provider, MD  hydrocortisone (CORTEF) 10 MG tablet Take 5-10 mg by mouth 2 (two) times daily. Pt takes  10 mg in the morning and 5 mg at bedtime.    Historical Provider, MD  HYDROmorphone (DILAUDID) 4 MG tablet Take 1.5 tablets (6 mg total) by mouth every 8 (eight) hours as needed for moderate pain or severe pain. 06/30/16   Standley Brooking, MD  levothyroxine (SYNTHROID, LEVOTHROID) 112 MCG tablet Take 112 mcg by mouth daily.     Historical Provider, MD  loratadine (CLARITIN) 10 MG tablet Take 10 mg by mouth daily.    Historical Provider, MD  lovastatin (MEVACOR)  40 MG tablet Take 20 mg by mouth every evening.     Historical Provider, MD  Melatonin 5 MG TABS Take 2 tablets by mouth at bedtime.    Historical Provider, MD  nystatin ointment (MYCOSTATIN) Apply 1 application topically 2 (two) times daily. Apply to affected area (groin, abdominal fold). 06/30/16   Standley Brooking, MD  pantoprazole (PROTONIX) 40 MG tablet Take 1 tablet (40 mg total) by mouth daily. 02/27/16   [name], MD  polyethylene glycol powder (MIRALAX) powder Mix one dose or 17 g in 8 ounces of water,  take 1 dose every 30 minutes for 2-3 hours or until you  get good results and then once or twice daily to prevent constipation. 07/04/16   Devoria Albe, MD  risperidone (RISPERDAL) 4 MG tablet Take 2-4 mg by mouth 2 (two) times daily. Pt takes 4 mg in the morning and 2 mg at bedtime.    Historical Provider, MD  sertraline (ZOLOFT) 50 MG tablet Take 50 mg by mouth daily.    Historical Provider, MD  sevelamer carbonate (RENVELA) 800 MG tablet Take 2,400 mg by mouth 2 (two) times daily.     Historical Provider, MD  topiramate (TOPAMAX) 200 MG tablet Take 200 mg by mouth 2 (two) times daily. Pt takes with a 50 mg tablet.    Historical Provider, MD  topiramate (TOPAMAX) 50 MG tablet Take 50 mg by mouth 2 (two) times daily. Pt takes with a 200 mg tablet.    Historical Provider, MD  Vitamin D, Ergocalciferol, (DRISDOL) 50000 UNITS CAPS capsule Take 50,000 Units by mouth every 30 (thirty) days. Takes on the 8th of every month    Historical Provider, MD    Allergies Dhea [nutritional supplements]; Demerol [meperidine]; Floxin [ofloxacin]; Nsaids; Nubain [nalbuphine hcl]; Phenergan [promethazine hcl]; Stadol [butorphanol]; and Erythromycin  Family History  Problem Relation Age of Onset  . Heart attack Mother   . Migraines Mother   . Aneurysm Mother   . Diabetes Father     Social History Social History  Substance Use Topics  . Smoking status: Current Every Day Smoker    Packs/day: 0.50     Years: 16.00    Types: Cigarettes  . Smokeless tobacco: Never Used  . Alcohol use No     Comment: occasionally    Review of Systems  Constitutional: Negative for fever. Cardiovascular: Negative for chest pain. Respiratory: Negative for shortness of breath. Gastrointestinal: Negative for abdominal pain, vomiting and diarrhea. Neurological: Negative for headaches, focal weakness or numbness.  10-point ROS otherwise negative.  ____________________________________________   PHYSICAL EXAM:  VITAL SIGNS: ED Triage Vitals  Enc Vitals Group     BP 140/63     Pulse 68     Resp 14     Temp 98.4     Temp src      SpO2 95   Constitutional: Alert and oriented. Slightly sleepy however easily awakens to verbal stimuli, able to answer  questions appropriately.  Eyes: Conjunctivae are normal. Normal extraocular movements. ENT   Head: Normocephalic and atraumatic.   Nose: No congestion/rhinnorhea.   Mouth/Throat: Mucous membranes are moist.   Neck: No stridor. Hematological/Lymphatic/Immunilogical: No cervical lymphadenopathy. Cardiovascular: Normal rate, regular rhythm.  No murmurs, rubs, or gallops.  Respiratory: Normal respiratory effort without tachypnea nor retractions. Breath sounds are clear and equal bilaterally. No wheezes/rales/rhonchi. Gastrointestinal: Soft and nontender. No distention.  Genitourinary: Deferred Musculoskeletal: Normal range of motion in all extremities. Bilateral lower extremity edema.  Neurologic:  Normal speech and language. No gross focal neurologic deficits are appreciated.  Skin:  Skin is warm, dry and intact. No rash noted. Psychiatric: Mood and affect are normal. Speech and behavior are normal. Patient exhibits appropriate insight and judgment.  ____________________________________________    LABS (pertinent positives/negatives)  Labs Reviewed  CBC WITH DIFFERENTIAL/PLATELET - Abnormal; Notable for the following:       Result Value    RBC 2.34 (*)    Hemoglobin 7.4 (*)    HCT 22.8 (*)    RDW 18.4 (*)    Platelets 138 (*)    All other components within normal limits  BASIC METABOLIC PANEL - Abnormal; Notable for the following:    CO2 21 (*)    Glucose, Bld 51 (*)    BUN 69 (*)    Creatinine, Ser 6.55 (*)    Calcium 6.8 (*)    GFR calc non Af Amer 7 (*)    GFR calc Af Amer 8 (*)    All other components within normal limits  URINALYSIS COMPLETEWITH MICROSCOPIC ([facility] ONLY) - Abnormal; Notable for the following:    Color, Urine YELLOW (*)    APPearance CLEAR (*)    Protein, ur 100 (*)    Squamous Epithelial / LPF 0-5 (*)    All other components within normal limits  TROPONIN I     ____________________________________________   EKG  None  ____________________________________________    RADIOLOGY  None  ____________________________________________   PROCEDURES  Procedures  ____________________________________________   INITIAL IMPRESSION / ASSESSMENT AND PLAN / ED COURSE  Pertinent labs & imaging results that were available during my care of the patient were reviewed by me and considered in my medical decision making (see chart for details).  Patient here with lethargy, possible took too many pain medications. Patient was observed in the emergency department for a number of hours and did appear to become more awake. The patient blood work appears to be at her baseline. She did state that she felt comfortable going home at time of discharge. Did discuss return precautions with the patient.   ____________________________________________   FINAL CLINICAL IMPRESSION(S) / ED DIAGNOSES  Final diagnoses:  Weakness     Note: This dictation was prepared with Dragon dictation. Any transcriptional errors that result from this process are unintentional    Phineas Semen, MD 08/31/16 [id number]

## 2016-08-31 NOTE — ED Notes (Signed)
[facility] called and transport has been arranged; representative states they will send someone out directly.

## 2016-08-31 NOTE — ED Triage Notes (Addendum)
Pt in via EMS from [facility] [place]; pt with home health aide who checks on her "every other day."  Home health RN called EMS because pt was "practically sleeping while standing."  Pt is a dialysis pt.  Pt lethargic upon arrival, responsive to verbal stimuli.  Pt A/Ox4, vitals WDL.  Pt takes 4mg  hydromorphone q8 hours PRN; pt unable to tell me when she last took medication.  No immediate distress noted at this time.

## 2016-09-03 ENCOUNTER — Emergency Department: Payer: Medicare Other

## 2016-09-03 ENCOUNTER — Emergency Department
Admission: EM | Admit: 2016-09-03 | Discharge: 2016-09-04 | Disposition: A | Discharge status: Home / Self Care | Payer: Medicare Other | Attending: Emergency Medicine | Admitting: Emergency Medicine

## 2016-09-03 ENCOUNTER — Encounter: Payer: Self-pay | Admitting: Emergency Medicine

## 2016-09-03 DIAGNOSIS — J45909 Unspecified asthma, uncomplicated: Secondary | ICD-10-CM | POA: Insufficient documentation

## 2016-09-03 DIAGNOSIS — F17210 Nicotine dependence, cigarettes, uncomplicated: Secondary | ICD-10-CM | POA: Diagnosis not present

## 2016-09-03 DIAGNOSIS — R600 Localized edema: Secondary | ICD-10-CM | POA: Diagnosis not present

## 2016-09-03 DIAGNOSIS — Z992 Dependence on renal dialysis: Secondary | ICD-10-CM | POA: Diagnosis not present

## 2016-09-03 DIAGNOSIS — J111 Influenza due to unidentified influenza virus with other respiratory manifestations: Secondary | ICD-10-CM | POA: Insufficient documentation

## 2016-09-03 DIAGNOSIS — E039 Hypothyroidism, unspecified: Secondary | ICD-10-CM | POA: Insufficient documentation

## 2016-09-03 DIAGNOSIS — R509 Fever, unspecified: Secondary | ICD-10-CM | POA: Diagnosis present

## 2016-09-03 DIAGNOSIS — Z79899 Other long term (current) drug therapy: Secondary | ICD-10-CM | POA: Insufficient documentation

## 2016-09-03 DIAGNOSIS — R69 Illness, unspecified: Secondary | ICD-10-CM

## 2016-09-03 LAB — COMPREHENSIVE METABOLIC PANEL WITH GFR
ALT: 15 U/L (ref 14–54)
AST: 43 U/L — ABNORMAL HIGH (ref 15–41)
Albumin: 3.1 g/dL — ABNORMAL LOW (ref 3.5–5.0)
Alkaline Phosphatase: 333 U/L — ABNORMAL HIGH (ref 38–126)
Anion gap: 9 (ref 5–15)
BUN: 13 mg/dL (ref 6–20)
CO2: 28 mmol/L (ref 22–32)
Calcium: 7.5 mg/dL — ABNORMAL LOW (ref 8.9–10.3)
Chloride: 98 mmol/L — ABNORMAL LOW (ref 101–111)
Creatinine, Ser: 3.26 mg/dL — ABNORMAL HIGH (ref 0.44–1.00)
GFR calc Af Amer: 19 mL/min — ABNORMAL LOW
GFR calc non Af Amer: 16 mL/min — ABNORMAL LOW
Glucose, Bld: 71 mg/dL (ref 65–99)
Potassium: 3 mmol/L — ABNORMAL LOW (ref 3.5–5.1)
Sodium: 135 mmol/L (ref 135–145)
Total Bilirubin: 1.1 mg/dL (ref 0.3–1.2)
Total Protein: 6.1 g/dL — ABNORMAL LOW (ref 6.5–8.1)

## 2016-09-03 LAB — CBC WITH DIFFERENTIAL/PLATELET
BASOS PCT: 0 %
Basophils Absolute: 0 10*3/uL (ref 0–0.1)
EOS ABS: 0.2 10*3/uL (ref 0–0.7)
Eosinophils Relative: 2 %
HCT: 22.7 % — ABNORMAL LOW (ref 35.0–47.0)
HEMOGLOBIN: 7.5 g/dL — AB (ref 12.0–16.0)
Lymphocytes Relative: 9 %
Lymphs Abs: 0.7 10*3/uL — ABNORMAL LOW (ref 1.0–3.6)
MCH: 31.7 pg (ref 26.0–34.0)
MCHC: 33.2 g/dL (ref 32.0–36.0)
MCV: 95.7 fL (ref 80.0–100.0)
Monocytes Absolute: 0.6 10*3/uL (ref 0.2–0.9)
Monocytes Relative: 8 %
NEUTROS PCT: 81 %
Neutro Abs: 5.9 10*3/uL (ref 1.4–6.5)
Platelets: 119 10*3/uL — ABNORMAL LOW (ref 150–440)
RBC: 2.37 MIL/uL — AB (ref 3.80–5.20)
RDW: 17.3 % — ABNORMAL HIGH (ref 11.5–14.5)
WBC: 7.3 10*3/uL (ref 3.6–11.0)

## 2016-09-03 LAB — INFLUENZA PANEL BY PCR (TYPE A & B)
Influenza A By PCR: NEGATIVE
Influenza B By PCR: NEGATIVE

## 2016-09-03 LAB — LIPASE, BLOOD: Lipase: 30 U/L (ref 11–51)

## 2016-09-03 MED ORDER — METHYLPREDNISOLONE SODIUM SUCC 125 MG IJ SOLR
60.0000 mg | INTRAMUSCULAR | Status: AC
  Administered 2016-09-03: 60 mg via INTRAVENOUS
  Filled 2016-09-03: qty 2

## 2016-09-03 NOTE — ED Notes (Signed)
Pt is visually impaired; Pt assisted via wheelchair to BR to void; back to bed without incident; side rails up x 2 with callbell in reach;

## 2016-09-03 NOTE — ED Notes (Signed)
Pt given phone to call someone for transportation following d/c. Dr Fanny Bien stated that pt can be given a taxi voucher if unable to contact a friend or family member for a ride.

## 2016-09-03 NOTE — ED Provider Notes (Addendum)
[facility] Emergency Department Provider Note   ____________________________________________   First MD Initiated Contact with Patient 09/03/16 2023     (approximate)  I have reviewed the triage vital signs and the nursing notes.   HISTORY  Chief Complaint Chills; Generalized Body Aches; and Headache    HPI Erin [name] is a 45 y.o. female reports that she was started on a second dose of vancomycin today after her dialysis run. She had a fever to "103" at dialysis, and she is continued to feel body aches and dry cough and chills for the last 3 days. She reports sudden onset about 3 days ago, and has received vancomycin during her last two hemodialysis sessions.  Denies being in pain at present. No headache or neck pain. 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 She reports she still able to walk, she took 2 Tylenol tablets about an hour before she came to the emergency room.  She comes because she is feeling achy, runny nose and cough as though she has "the flu".  Reports she completed a full dialysis session today.  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  Past Medical History:  Diagnosis Date  . Anxiety   . Asthma   . Bipolar affect, depressed ([place])   . Blind   . Chronic headaches    Seeing Pain Management  . Constipation   . Depression   . Epilepsy ([place])   . Fibromyalgia   . History of CVA (cerebrovascular accident)   . History of DVT (deep vein thrombosis)   . Hyperlipidemia   . Hypothyroid   . Kidney failure    Currently on Dialysis  . Narcolepsy   . Psychosis     Patient Active Problem List   Diagnosis Date Noted  . Kidney failure   . Palliative care encounter   . Goals of care, counseling/discussion   . Hypoglycemia 06/27/2016  . Fungal rash of trunk 06/27/2016  . Seizure disorder ([place]) 06/27/2016  . Blindness 06/27/2016  . Altered mental status 06/27/2016    . Chronic pain 06/27/2016  . History of CVA (cerebrovascular accident) 06/27/2016  . GI bleed 03/04/2016  . Generalized weakness 03/04/2016  . Hypothyroidism 03/04/2016  . GERD (gastroesophageal reflux disease) 03/04/2016  . End stage renal disease on dialysis ([place]) 03/04/2016  . Bipolar 1 disorder, mixed, moderate ([place]) 02/26/2016  . 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 Anemia 02/25/2016    Past Surgical History:  Procedure Laterality Date  . BRAIN SURGERY  1976  . BREAST LUMPECTOMY  2001  . CHOLECYSTECTOMY  2001  . DG AV DIALYSIS GRAFT DECLOT OR     X 4  . GIVENS CAPSULE STUDY N/A 02/29/2016   Procedure: GIVENS CAPSULE STUDY;  Surgeon: Josefine Class, MD;  Location: [facility] ENDOSCOPY;  Service: Endoscopy;  Laterality: N/A;  . PERIPHERAL VASCULAR CATHETERIZATION N/A 08/21/2015   Procedure: Dialysis/Perma Catheter Insertion;  Surgeon: Katha Cabal, MD;  Location: [facility] CV LAB;  Service: Cardiovascular;  Laterality: N/A;  . PERIPHERAL VASCULAR CATHETERIZATION N/A 02/29/2016   Procedure: Dialysis/Perma Catheter Removal;  Surgeon: Algernon Huxley, MD;  Location: [facility] CV LAB;  Service: Cardiovascular;  Laterality: N/A;  . PITUITARY EXCISION  1976    Prior to Admission medications   Medication Sig Start Date End Date Taking? Authorizing Provider  estradiol (ESTRACE) 0.1 MG/GM vaginal cream Place 1 Applicatorful vaginally  2 (two) times a week. Mondays and Thursdays    Historical Provider, MD  fludrocortisone (FLORINEF) 0.1 MG tablet Take 0.1 mg by mouth daily.    Historical Provider, MD  furosemide (LASIX) 40 MG tablet Take 40 mg by mouth 2 (two) times daily.    Historical Provider, MD  gabapentin (NEURONTIN) 100 MG capsule Take 400 mg by mouth at bedtime.    Historical Provider, MD  hydrocortisone (CORTEF) 10 MG tablet Take 5-10 mg by mouth 2 (two) times daily. Pt takes 10 mg in the morning and 5 mg at  bedtime.    Historical Provider, MD  HYDROmorphone (DILAUDID) 4 MG tablet Take 1.5 tablets (6 mg total) by mouth every 8 (eight) hours as needed for moderate pain or severe pain. 06/30/16   Samuella Cota, MD  levothyroxine (SYNTHROID, LEVOTHROID) 112 MCG tablet Take 112 mcg by mouth daily.     Historical Provider, MD  loratadine (CLARITIN) 10 MG tablet Take 10 mg by mouth daily.    Historical Provider, MD  lovastatin (MEVACOR) 40 MG tablet Take 20 mg by mouth every evening.     Historical Provider, MD  Melatonin 5 MG TABS Take 2 tablets by mouth at bedtime.    Historical Provider, MD  nystatin ointment (MYCOSTATIN) Apply 1 application topically 2 (two) times daily. Apply to affected area (groin, abdominal fold). 06/30/16   Samuella Cota, MD  pantoprazole (PROTONIX) 40 MG tablet Take 1 tablet (40 mg total) by mouth daily. 02/27/16   Bettey Costa, MD  polyethylene glycol powder (MIRALAX) powder Mix one dose or 17 g in 8 ounces of water,  take 1 dose every 30 minutes for 2-3 hours or until you  get good results and then once or twice daily to prevent constipation. 07/04/16   Rolland Porter, MD  risperidone (RISPERDAL) 4 MG tablet Take 2-4 mg by mouth 2 (two) times daily. Pt takes 4 mg in the morning and 2 mg at bedtime.    Historical Provider, MD  sertraline (ZOLOFT) 50 MG tablet Take 50 mg by mouth daily.    Historical Provider, MD  sevelamer carbonate (RENVELA) 800 MG tablet Take 2,400 mg by mouth 2 (two) times daily.     Historical Provider, MD  topiramate (TOPAMAX) 200 MG tablet Take 200 mg by mouth 2 (two) times daily. Pt takes with a 50 mg tablet.    Historical Provider, MD  topiramate (TOPAMAX) 50 MG tablet Take 50 mg by mouth 2 (two) times daily. Pt takes with a 200 mg tablet.    Historical Provider, MD  Vitamin D, Ergocalciferol, (DRISDOL) 50000 UNITS CAPS capsule Take 50,000 Units by mouth every 30 (thirty) days. Takes on the 8th of every month    Historical Provider, MD    Allergies Dhea  [nutritional supplements]; Demerol [meperidine]; Floxin [ofloxacin]; Nsaids; Nubain [nalbuphine hcl]; Phenergan [promethazine hcl]; Stadol [butorphanol]; and Erythromycin  Family History  Problem Relation Age of Onset  . Heart attack Mother   . Migraines Mother   . Aneurysm Mother   . Diabetes Father     Social History Social History  Substance Use Topics  . Smoking status: Current Every Day Smoker    Packs/day: 0.50    Years: 16.00    Types: Cigarettes  . Smokeless tobacco: Never Used  . Alcohol use No     Comment: occasionally    Review of Systems Constitutional: Patient reports she was told she had a fever to 103 by her dialysis team today. She  reports feeling slightly fatigued, having slight runny nose Eyes: No visual changes and she is blind at baseline. ENT: No sore throat. Runny nose Cardiovascular: Denies chest pain. Respiratory: Denies shortness of breath. Occasional track nonproductive cough Gastrointestinal: No abdominal pain.  No nausea, no vomiting.  No diarrhea.  No constipation. Genitourinary: Patient reports she only produces urine on a very rare basis. Musculoskeletal: Negative for back pain. Skin: Negative for rash. Neurological: Negative for headaches or new focal weakness or numbness.  10-point ROS otherwise negative.  ____________________________________________   PHYSICAL EXAM:  VITAL SIGNS: ED Triage Vitals  Enc Vitals Group     BP 09/03/16 1900 (!) 141/66     Pulse Rate 09/03/16 1900 87     Resp 09/03/16 1900 18     Temp 09/03/16 1900 99.2 F (37.3 C)     Temp Source 09/03/16 1900 Oral     SpO2 09/03/16 1900 96 %     Weight 09/03/16 1901 163 lb 2.3 oz (74 kg)     Height 09/03/16 1901 _0  (1.473 m)     Head Circumference --      Peak Flow --      Pain Score 09/03/16 1907 10     Pain Loc --      Pain Edu? --      Excl. in [place]? --     Constitutional: Alert and oriented. Chronically ill-appearing, but in no acute distress  Eyes:  Conjunctivae are normal. She is slightly disconjugate but patient reports she is also blind  Head: Atraumatic. Nose: Very minimal clear coryza. Mouth/Throat: Mucous membranes are slightly dry.  Oropharynx non-erythematous. Neck: No stridor.   Cardiovascular: Normal rate, regular rhythm. Grossly normal heart sounds.  Good peripheral circulation. Respiratory: Normal respiratory effort.  No retractions. Lungs CTAB. Occasional dry nonproductive cough. Gastrointestinal: Soft and nontender. No distention.  Musculoskeletal: No lower extremity tenderness though there is mild 2+ pitting edema in lower extremities which patient reports is chronic.  No joint effusions. Neurologic:  Normal speech and language. No gross focal neurologic deficits are appreciated.  Skin:  Skin is warm, dry and intact. No rash noted. The right upper extremity graft is clean dry and intact. Careful skin exam reveals no erythema or regions of cellulitis. Psychiatric: Mood and affect are normal. Speech and behavior are normal.  ____________________________________________   LABS (all labs ordered are listed, but only abnormal results are displayed)  Labs Reviewed  CBC WITH DIFFERENTIAL/PLATELET - Abnormal; Notable for the following:       Result Value   RBC 2.37 (*)    Hemoglobin 7.5 (*)    HCT 22.7 (*)    RDW 17.3 (*)    Platelets 119 (*)    Lymphs Abs 0.7 (*)    All other components within normal limits  COMPREHENSIVE METABOLIC PANEL - Abnormal; Notable for the following:    Potassium 3.0 (*)    Chloride 98 (*)    Creatinine, Ser 3.26 (*)    Calcium 7.5 (*)    Total Protein 6.1 (*)    Albumin 3.1 (*)    AST 43 (*)    Alkaline Phosphatase 333 (*)    GFR calc non Af Amer 16 (*)    GFR calc Af Amer 19 (*)    All other components within normal limits  CULTURE, BLOOD (ROUTINE X 2)  CULTURE, BLOOD (ROUTINE X 2)  INFLUENZA PANEL BY PCR (TYPE A & B, H1N1)  LIPASE, BLOOD    ____________________________________________  EKG  Reviewed and interpreted by me at 2030 Ventricular rate 70 PR 160 QTc 450 Normal sinus rhythm, notable T-wave inversions in a septal distribution, compared with the patient's previous EKGs including that from 06/21/2016 no significant change, pre-existing T-wave abnormality Patient denies any chest pain or ____________________________________________  RADIOLOGY  Dg Chest 2 View  Result Date: 09/03/2016 CLINICAL DATA:  Fever.  Dialysis patient. EXAM: CHEST  2 VIEW COMPARISON:  06/27/2016 FINDINGS: An upper thoracic compression deformity was present on 06/04/2016 CT. This is severe. Midline trachea. Mild cardiomegaly. Mediastinal contours otherwise within normal limits. No pleural effusion or pneumothorax. No congestive failure. No lobar consolidation. Cholecystectomy. IMPRESSION: No acute process or explanation for fever. Cardiomegaly without congestive failure. Electronically Signed   By: Jeronimo Greaves M.D.   On: 09/03/2016 21:12    ____________________________________________   PROCEDURES  Procedure(s) performed: None  Procedures  Critical Care performed: No  ____________________________________________   INITIAL IMPRESSION / ASSESSMENT AND PLAN / ED COURSE  Pertinent labs & imaging results that were available during my care of the patient were reviewed by me and considered in my medical decision making (see chart for details).  Patient presents for evaluation of reported fever. She is awake, alert, in no distress at this time with normal hemodynamics and afebrile. She is however a hemodialysis patient has been dosed with vancomycin and the last 2 dialysis sessions, and reports runny nose and dry nonproductive cough. Overall nontoxic and well-appearing, no evidence of acute bacterial infection by my clinical examination that she does have a slight amount of clear coryza and a occasional dry cough without any rales or  hypoxia. His had her hemodialysis today, and her graft site appears normal. No cellulitis on exam. No abnormal skin findings. No headache or neck pain. No abdominal pain or discomfort.  Clinical Course   Discussed with Dr. Ronn Melena, reports has had Vancomycin after both sessions, including today. There was reported concern for possible infection, but Dr. Ronn Melena reports patient was afebrile on documentation from today's session and no concerns were noted by dialysis team or documentation. Suspected infection but blood cultures thus far negative to date. Patient is anuric. Recommends cbc, met panel, cxr. Agrees with solumedrol IV. His labs reassuring, advised patient could be discharged to continue her dialysis regimen  WBC 10.2, Hgb 7.1 on 10/31 at office. On today's evaluation, labs including white cell count normal, hemoglobin stable. Chest x-ray clear.   ----------------------------------------- 11:41 PM on 09/03/2016 -----------------------------------------  Patient continues to be stable. Afebrile, she is alert no distress reports no concerns of present other than a runny nose. I discussed with the patient her labs, and she is comfortable with the plan to go home and continue her dialysis follow-up. I did discuss very careful return precautions with her and she will continue her steroids as prescribed, and was given a stress dose here though no hypotension or evidence for an obvious adrenal crisis.  Careful return precautions advised. Return precautions and treatment recommendations and follow-up discussed with the patient who is agreeable with the plan. Understands she is to call her primary care doctor for follow-up on Monday as well as check in and have her dialysis performed that they.  ____________________________________________   FINAL CLINICAL IMPRESSION(S) / ED DIAGNOSES  Final diagnoses:  Influenza-like illness  Subjectively reported Fever    NEW MEDICATIONS STARTED DURING  THIS VISIT:  New Prescriptions   No medications on file     Note:  This document was prepared using Dragon voice  recognition software and may include unintentional dictation errors.     Delman Kitten, MD 09/03/16 2347  The patient comes for the plan for discharge, her ongoing care assigned to Dr. Beather Arbour while she is in the ER as patient is awaiting transport. She is blind, and she is attempting to arrange for a friend to bring her home, however [place] will assist in arranging for her to be transported home by taxi if friend is unavailable.    Delman Kitten, MD 09/03/16 [phone]

## 2016-09-03 NOTE — ED Notes (Signed)
2nd set of blood cultures not needed per Dr. Fanny Bien.

## 2016-09-03 NOTE — ED Notes (Signed)
Pt back from x-ray.

## 2016-09-03 NOTE — ED Notes (Signed)
Patient transported to X-ray 

## 2016-09-03 NOTE — ED Triage Notes (Signed)
Patient states that "I think that I have the flu". Patient states that she has had chills, body aches and headache for a 2 days. Patient states that earlier today she had a fever of 103.6.

## 2016-09-03 NOTE — ED Notes (Signed)
Pt moved to treatment room 16 via stretcher by this RN without incident; bedside report given to [place], Charity fundraiser and verbal report given to Dr Pershing Proud

## 2016-09-03 NOTE — ED Notes (Signed)
Pt says she goes to dialysis [id number]_sat; today at dialysis she had a fever 103.6; they gave her vancomycin and temp came down to 100; pt did receive her dialysis treatment; pt also reports headache, body aches and cough; some production of unknown color; pt denies shortness of breath; talking in complete coherent sentences; pt adds dizziness; pt also adds at this time some  Dysuria when she voided earlier; c/o diarrhea that started this afternoon;

## 2016-09-08 LAB — CULTURE, BLOOD (ROUTINE X 2): Culture: NO GROWTH

## 2016-09-17 ENCOUNTER — Emergency Department: Payer: Medicare Other

## 2016-09-17 ENCOUNTER — Emergency Department
Admission: EM | Admit: 2016-09-17 | Discharge: 2016-09-17 | Disposition: A | Discharge status: Home / Self Care | Payer: Medicare Other | Attending: Emergency Medicine | Admitting: Emergency Medicine

## 2016-09-17 ENCOUNTER — Encounter: Payer: Self-pay | Admitting: Emergency Medicine

## 2016-09-17 DIAGNOSIS — N186 End stage renal disease: Secondary | ICD-10-CM | POA: Diagnosis not present

## 2016-09-17 DIAGNOSIS — J45909 Unspecified asthma, uncomplicated: Secondary | ICD-10-CM | POA: Diagnosis not present

## 2016-09-17 DIAGNOSIS — F17210 Nicotine dependence, cigarettes, uncomplicated: Secondary | ICD-10-CM | POA: Diagnosis not present

## 2016-09-17 DIAGNOSIS — D649 Anemia, unspecified: Secondary | ICD-10-CM

## 2016-09-17 DIAGNOSIS — E039 Hypothyroidism, unspecified: Secondary | ICD-10-CM | POA: Diagnosis not present

## 2016-09-17 DIAGNOSIS — Z79899 Other long term (current) drug therapy: Secondary | ICD-10-CM | POA: Diagnosis not present

## 2016-09-17 DIAGNOSIS — Z992 Dependence on renal dialysis: Secondary | ICD-10-CM | POA: Diagnosis not present

## 2016-09-17 DIAGNOSIS — R5383 Other fatigue: Secondary | ICD-10-CM | POA: Diagnosis present

## 2016-09-17 HISTORY — DX: Hypopituitarism: E23.0

## 2016-09-17 LAB — CBC
HCT: 22.5 % — ABNORMAL LOW (ref 35.0–47.0)
Hemoglobin: 7.3 g/dL — ABNORMAL LOW (ref 12.0–16.0)
MCH: 31.2 pg (ref 26.0–34.0)
MCHC: 32.7 g/dL (ref 32.0–36.0)
MCV: 95.5 fL (ref 80.0–100.0)
PLATELETS: 197 10*3/uL (ref 150–440)
RBC: 2.36 MIL/uL — ABNORMAL LOW (ref 3.80–5.20)
RDW: 17.7 % — AB (ref 11.5–14.5)
WBC: 9.3 10*3/uL (ref 3.6–11.0)

## 2016-09-17 LAB — BASIC METABOLIC PANEL
Anion gap: 9 (ref 5–15)
BUN: 36 mg/dL — AB (ref 6–20)
CALCIUM: 7.3 mg/dL — AB (ref 8.9–10.3)
CO2: 24 mmol/L (ref 22–32)
CREATININE: 6.25 mg/dL — AB (ref 0.44–1.00)
Chloride: 107 mmol/L (ref 101–111)
GFR calc Af Amer: 9 mL/min — ABNORMAL LOW (ref >60)
GFR, EST NON AFRICAN AMERICAN: 7 mL/min — AB (ref >60)
GLUCOSE: 87 mg/dL (ref 65–99)
Potassium: 3.8 mmol/L (ref 3.5–5.1)
Sodium: 140 mmol/L (ref 135–145)

## 2016-09-17 LAB — TROPONIN I

## 2016-09-17 MED ORDER — ACETAMINOPHEN 500 MG PO TABS
500.0000 mg | ORAL_TABLET | Freq: Once | ORAL | Status: AC
  Administered 2016-09-17: 500 mg via ORAL
  Filled 2016-09-17: qty 1

## 2016-09-17 NOTE — ED Notes (Signed)
This RN to bedside at this time. Pt requesting something to drink. This RN explained to patient that we were unable to give her anything to drink until cleared by MD. Pt states understanding at this time.

## 2016-09-17 NOTE — ED Notes (Signed)
Pt states "I need something for my chest pain." Received verbal orders for 500mg  of Tylenol at this time.

## 2016-09-17 NOTE — ED Triage Notes (Signed)
Pt presents to ED from home (pt lives at [address]) with c/o weakness. Pt states was told at dialysis today that she had a HGB of 6.9. Pt states she did not finish her dialysis and only received 1.5 hrs while she was there due to her not feeling well. EMS also reports intermittent chest pain at this time. Of note, pt is blind. NAD noted a this time. VSS.

## 2016-09-17 NOTE — ED Provider Notes (Addendum)
[facility] Emergency Department Provider Note  ________________________________________   I have reviewed the triage vital signs and the nursing notes.   HISTORY  Chief Complaint Anemia  History limited by: Not Limited   HPI Erin [name] is a 45 y.o. female who since to the emergency department today from dialysis because of apparent anemia. Apparently patient was tested and found to have a hemoglobin less than 7. Patient does state that she has been feeling fatigued and tired recently. Additionally the patient is complaining of some chronic pain issues. Furthermore she does state that she has noticed blood in her stool.   Past Medical History:  Diagnosis Date  . Anxiety   . Asthma   . Bipolar affect, depressed (HCC)   . Blind   . Chronic headaches    Seeing Pain Management  . Constipation   . Depression   . Epilepsy (HCC)   . Fibromyalgia   . History of CVA (cerebrovascular accident)   . History of DVT (deep vein thrombosis)   . Hyperlipidemia   . Hypothyroid   . Kidney failure    Currently on Dialysis  . Narcolepsy   . 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 Psychosis     Patient Active Problem List   Diagnosis Date Noted  . Kidney failure   . Palliative care encounter   . Goals of care, counseling/discussion   . Hypoglycemia 06/27/2016  . Fungal rash of trunk 06/27/2016  . Seizure disorder (HCC) 06/27/2016  . Blindness 06/27/2016  . Altered mental status 06/27/2016  . Chronic pain 06/27/2016  . History of CVA (cerebrovascular accident) 06/27/2016  . GI bleed 03/04/2016  . Generalized weakness 03/04/2016  . Hypothyroidism 03/04/2016  . GERD (gastroesophageal reflux disease) 03/04/2016  . End stage renal disease on dialysis (HCC) 03/04/2016  . Bipolar 1 disorder, mixed, moderate (HCC) 02/26/2016  . 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 Anemia 02/25/2016    Past Surgical History:  Procedure Laterality Date  . BRAIN SURGERY  1976  . BREAST LUMPECTOMY  2001  . CHOLECYSTECTOMY  2001  . DG AV DIALYSIS GRAFT DECLOT OR     X 4  . GIVENS CAPSULE STUDY N/A 02/29/2016   Procedure: GIVENS CAPSULE STUDY;  Surgeon: Elnita Maxwell, MD;  Location: [facility] ENDOSCOPY;  Service: Endoscopy;  Laterality: N/A;  . PERIPHERAL VASCULAR CATHETERIZATION N/A 08/21/2015   Procedure: Dialysis/Perma Catheter Insertion;  Surgeon: Renford Dills, MD;  Location: [facility] INVASIVE CV LAB;  Service: Cardiovascular;  Laterality: N/A;  . PERIPHERAL VASCULAR CATHETERIZATION N/A 02/29/2016   Procedure: Dialysis/Perma Catheter Removal;  Surgeon: Annice Needy, MD;  Location: [facility] INVASIVE CV LAB;  Service: Cardiovascular;  Laterality: N/A;  . PITUITARY EXCISION  1976    Prior to Admission medications   Medication Sig Start Date End Date Taking? Authorizing Provider  estradiol (ESTRACE) 0.1 MG/GM vaginal cream Place 1 Applicatorful vaginally 2 (two) times a week. Mondays and Thursdays    Historical Provider, MD  fludrocortisone (FLORINEF) 0.1 MG tablet Take 0.1 mg by mouth daily.    Historical Provider, MD  furosemide (LASIX) 40 MG tablet Take 40 mg by mouth 2 (two) times daily.    Historical Provider, MD  gabapentin (NEURONTIN) 100 MG capsule Take 400 mg by mouth at bedtime.    Historical Provider, MD  hydrocortisone (CORTEF) 10 MG tablet Take 5-10 mg by mouth 2 (two) times daily. Pt takes 10 mg  in the morning and 5 mg at bedtime.    Historical Provider, MD  HYDROmorphone (DILAUDID) 4 MG tablet Take 1.5 tablets (6 mg total) by mouth every 8 (eight) hours as needed for moderate pain or severe pain. 06/30/16   Standley Brooking, MD  levothyroxine (SYNTHROID, LEVOTHROID) 112 MCG tablet Take 112 mcg by mouth daily.     Historical Provider, MD  loratadine (CLARITIN) 10 MG tablet Take 10 mg by mouth daily.    Historical Provider, MD  lovastatin (MEVACOR)  40 MG tablet Take 20 mg by mouth every evening.     Historical Provider, MD  Melatonin 5 MG TABS Take 2 tablets by mouth at bedtime.    Historical Provider, MD  nystatin ointment (MYCOSTATIN) Apply 1 application topically 2 (two) times daily. Apply to affected area (groin, abdominal fold). 06/30/16   Standley Brooking, MD  pantoprazole (PROTONIX) 40 MG tablet Take 1 tablet (40 mg total) by mouth daily. 02/27/16   [name], MD  polyethylene glycol powder (MIRALAX) powder Mix one dose or 17 g in 8 ounces of water,  take 1 dose every 30 minutes for 2-3 hours or until you  get good results and then once or twice daily to prevent constipation. 07/04/16   Devoria Albe, MD  risperidone (RISPERDAL) 4 MG tablet Take 2-4 mg by mouth 2 (two) times daily. Pt takes 4 mg in the morning and 2 mg at bedtime.    Historical Provider, MD  sertraline (ZOLOFT) 50 MG tablet Take 50 mg by mouth daily.    Historical Provider, MD  sevelamer carbonate (RENVELA) 800 MG tablet Take 2,400 mg by mouth 2 (two) times daily.     Historical Provider, MD  topiramate (TOPAMAX) 200 MG tablet Take 200 mg by mouth 2 (two) times daily. Pt takes with a 50 mg tablet.    Historical Provider, MD  topiramate (TOPAMAX) 50 MG tablet Take 50 mg by mouth 2 (two) times daily. Pt takes with a 200 mg tablet.    Historical Provider, MD  Vitamin D, Ergocalciferol, (DRISDOL) 50000 UNITS CAPS capsule Take 50,000 Units by mouth every 30 (thirty) days. Takes on the 8th of every month    Historical Provider, MD    Allergies Dhea [nutritional supplements]; Demerol [meperidine]; Floxin [ofloxacin]; Nsaids; Nubain [nalbuphine hcl]; Phenergan [promethazine hcl]; Stadol [butorphanol]; and Erythromycin  Family History  Problem Relation Age of Onset  . Heart attack Mother   . Migraines Mother   . Aneurysm Mother   . Diabetes Father     Social History Social History  Substance Use Topics  . Smoking status: Current Every Day Smoker    Packs/day: 0.50     Years: 16.00    Types: Cigarettes  . Smokeless tobacco: Never Used  . Alcohol use No     Comment: occasionally    Review of Systems  Constitutional: Negative for fever. Cardiovascular: Positive for chest pain. Respiratory: Negative for shortness of breath. Gastrointestinal: Negative for abdominal pain, vomiting and diarrhea. Positive for GI bleeding. Genitourinary: Negative for dysuria. Musculoskeletal: Negative for back pain. Skin: Negative for rash. Neurological: Negative for headaches, focal weakness or numbness.  10-point ROS otherwise negative.  ____________________________________________   PHYSICAL EXAM:  VITAL SIGNS: ED Triage Vitals  Enc Vitals Group     BP 164/86     Pulse 70     Resp 18     Temp 98.7     Temp src      SpO2 98  Weight    Constitutional: Awake, alert, oriented.  ENT   Head: Normocephalic and atraumatic.   Nose: No congestion/rhinnorhea.   Mouth/Throat: Mucous membranes are moist.   Neck: No stridor. Hematological/Lymphatic/Immunilogical: No cervical lymphadenopathy. Cardiovascular: Normal rate, regular rhythm.  No murmurs, rubs, or gallops.  Respiratory: Normal respiratory effort without tachypnea nor retractions. Breath sounds are clear and equal bilaterally. No wheezes/rales/rhonchi. Gastrointestinal: Soft and nontender. No distention.  Rectal: No blood on glove. GUIAC negative.  Genitourinary: Deferred Musculoskeletal: Normal range of motion in all extremities. No lower extremity edema. Neurologic:  Normal speech and language. Blind. Moves all extremities.  Skin:  Skin is warm, dry and intact. No rash noted. Psychiatric: Mood and affect are normal. Speech and behavior are normal. Patient exhibits appropriate insight and judgment.  ____________________________________________    LABS (pertinent positives/negatives)  Labs Reviewed  BASIC METABOLIC PANEL - Abnormal; Notable for the following:       Result Value    BUN 36 (*)    Creatinine, Ser 6.25 (*)    Calcium 7.3 (*)    GFR calc non Af Amer 7 (*)    GFR calc Af Amer 9 (*)    All other components within normal limits  CBC - Abnormal; Notable for the following:    RBC 2.36 (*)    Hemoglobin 7.3 (*)    HCT 22.5 (*)    RDW 17.7 (*)    All other components within normal limits  TROPONIN I     ____________________________________________   EKG  I, Phineas SemenGraydon Sherece Gambrill, attending physician, personally viewed and interpreted this EKG  EKG Time: 1505 Rate: 74 Rhythm: normal sinus rhythm Axis: right axis deviation Intervals: qtc 494 QRS: narrow ST changes: no st elevation Impression: abnormal behavior  ____________________________________________    RADIOLOGY  CXR IMPRESSION:  No active cardiopulmonary disease.     ____________________________________________   PROCEDURES  Procedures  ____________________________________________   INITIAL IMPRESSION / ASSESSMENT AND PLAN / ED COURSE  Pertinent labs & imaging results that were available during my care of the patient were reviewed by me and considered in my medical decision making (see chart for details).  Patient presented from dialysis because of concern for anemia. Patient's hgb today appears consistent with the patient's baseline. Potassium wnl. Discussed with Dr. Wynelle LinkKolluru. He is unsure why she was sent from dialysis given that patient has chronic history of anemia. Will plan on discharging to follow up in clinic.    ____________________________________________   FINAL CLINICAL IMPRESSION(S) / ED DIAGNOSES  Final diagnoses:  Anemia, unspecified type     Note: This dictation was prepared with Dragon dictation. Any transcriptional errors that result from this process are unintentional    Phineas SemenGraydon Halvor Behrend, MD 09/18/16 1150    Phineas SemenGraydon Shenee Wignall, MD 09/18/16 1218

## 2016-09-17 NOTE — ED Notes (Signed)
Pt resting in bed. NAD noted. Respirations even and unlabored. VSS and WNL at this time. Will continue to monitor for further patient needs. Pt given ice by Sheralyn Boatman, EDT.

## 2016-09-17 NOTE — ED Notes (Signed)
Reviewed d/c instructions and follow-up care with patient. Pt verbalized understanding.  

## 2016-09-17 NOTE — ED Notes (Signed)
Report given to Jenna, RN

## 2016-09-17 NOTE — Discharge Instructions (Signed)
Please seek medical attention for any high fevers, chest pain, shortness of breath, change in behavior, persistent vomiting, bloody stool or any other new or concerning symptoms.  

## 2016-09-30 DIAGNOSIS — I219 Acute myocardial infarction, unspecified: Secondary | ICD-10-CM

## 2016-09-30 HISTORY — DX: Acute myocardial infarction, unspecified: I21.9

## 2016-10-02 ENCOUNTER — Emergency Department: Payer: Medicare Other

## 2016-10-02 ENCOUNTER — Observation Stay
Admission: EM | Admit: 2016-10-02 | Discharge: 2016-10-04 | Disposition: A | Discharge status: Home / Self Care | Payer: Medicare Other | Attending: Internal Medicine | Admitting: Internal Medicine

## 2016-10-02 ENCOUNTER — Observation Stay
Admit: 2016-10-02 | Discharge: 2016-10-02 | Disposition: A | Discharge status: Home / Self Care | Payer: Medicare Other | Attending: Internal Medicine | Admitting: Internal Medicine

## 2016-10-02 ENCOUNTER — Encounter: Payer: Self-pay | Admitting: Emergency Medicine

## 2016-10-02 DIAGNOSIS — D631 Anemia in chronic kidney disease: Secondary | ICD-10-CM | POA: Insufficient documentation

## 2016-10-02 DIAGNOSIS — E232 Diabetes insipidus: Secondary | ICD-10-CM | POA: Insufficient documentation

## 2016-10-02 DIAGNOSIS — G47419 Narcolepsy without cataplexy: Secondary | ICD-10-CM | POA: Diagnosis not present

## 2016-10-02 DIAGNOSIS — G40909 Epilepsy, unspecified, not intractable, without status epilepticus: Secondary | ICD-10-CM | POA: Diagnosis not present

## 2016-10-02 DIAGNOSIS — E039 Hypothyroidism, unspecified: Secondary | ICD-10-CM | POA: Diagnosis not present

## 2016-10-02 DIAGNOSIS — Z86718 Personal history of other venous thrombosis and embolism: Secondary | ICD-10-CM | POA: Insufficient documentation

## 2016-10-02 DIAGNOSIS — N186 End stage renal disease: Secondary | ICD-10-CM | POA: Diagnosis not present

## 2016-10-02 DIAGNOSIS — F419 Anxiety disorder, unspecified: Secondary | ICD-10-CM | POA: Insufficient documentation

## 2016-10-02 DIAGNOSIS — E785 Hyperlipidemia, unspecified: Secondary | ICD-10-CM | POA: Diagnosis not present

## 2016-10-02 DIAGNOSIS — N2581 Secondary hyperparathyroidism of renal origin: Secondary | ICD-10-CM | POA: Insufficient documentation

## 2016-10-02 DIAGNOSIS — I959 Hypotension, unspecified: Secondary | ICD-10-CM | POA: Insufficient documentation

## 2016-10-02 DIAGNOSIS — R079 Chest pain, unspecified: Secondary | ICD-10-CM | POA: Diagnosis present

## 2016-10-02 DIAGNOSIS — M797 Fibromyalgia: Secondary | ICD-10-CM | POA: Diagnosis not present

## 2016-10-02 DIAGNOSIS — E230 Hypopituitarism: Secondary | ICD-10-CM | POA: Diagnosis not present

## 2016-10-02 DIAGNOSIS — G8929 Other chronic pain: Secondary | ICD-10-CM | POA: Diagnosis not present

## 2016-10-02 DIAGNOSIS — K219 Gastro-esophageal reflux disease without esophagitis: Secondary | ICD-10-CM | POA: Diagnosis not present

## 2016-10-02 DIAGNOSIS — Z79899 Other long term (current) drug therapy: Secondary | ICD-10-CM | POA: Diagnosis not present

## 2016-10-02 DIAGNOSIS — R778 Other specified abnormalities of plasma proteins: Secondary | ICD-10-CM

## 2016-10-02 DIAGNOSIS — R0789 Other chest pain: Principal | ICD-10-CM | POA: Insufficient documentation

## 2016-10-02 DIAGNOSIS — Z8673 Personal history of transient ischemic attack (TIA), and cerebral infarction without residual deficits: Secondary | ICD-10-CM | POA: Diagnosis not present

## 2016-10-02 DIAGNOSIS — H548 Legal blindness, as defined in USA: Secondary | ICD-10-CM | POA: Diagnosis not present

## 2016-10-02 DIAGNOSIS — F3160 Bipolar disorder, current episode mixed, unspecified: Secondary | ICD-10-CM | POA: Insufficient documentation

## 2016-10-02 DIAGNOSIS — R7989 Other specified abnormal findings of blood chemistry: Secondary | ICD-10-CM

## 2016-10-02 DIAGNOSIS — F17210 Nicotine dependence, cigarettes, uncomplicated: Secondary | ICD-10-CM | POA: Insufficient documentation

## 2016-10-02 DIAGNOSIS — Z992 Dependence on renal dialysis: Secondary | ICD-10-CM | POA: Insufficient documentation

## 2016-10-02 LAB — COMPREHENSIVE METABOLIC PANEL WITH GFR
ALT: 9 U/L — ABNORMAL LOW (ref 14–54)
AST: 28 U/L (ref 15–41)
Albumin: 2.9 g/dL — ABNORMAL LOW (ref 3.5–5.0)
Alkaline Phosphatase: 333 U/L — ABNORMAL HIGH (ref 38–126)
Anion gap: 10 (ref 5–15)
BUN: 33 mg/dL — ABNORMAL HIGH (ref 6–20)
CO2: 17 mmol/L — ABNORMAL LOW (ref 22–32)
Calcium: 8 mg/dL — ABNORMAL LOW (ref 8.9–10.3)
Chloride: 111 mmol/L (ref 101–111)
Creatinine, Ser: 9.93 mg/dL — ABNORMAL HIGH (ref 0.44–1.00)
GFR calc Af Amer: 5 mL/min — ABNORMAL LOW
GFR calc non Af Amer: 4 mL/min — ABNORMAL LOW
Glucose, Bld: 78 mg/dL (ref 65–99)
Potassium: 4.4 mmol/L (ref 3.5–5.1)
Sodium: 138 mmol/L (ref 135–145)
Total Bilirubin: 1.2 mg/dL (ref 0.3–1.2)
Total Protein: 5.7 g/dL — ABNORMAL LOW (ref 6.5–8.1)

## 2016-10-02 LAB — TROPONIN I
Troponin I: 0.12 ng/mL (ref <0.03)
Troponin I: 0.11 ng/mL (ref <0.03)
Troponin I: 0.09 ng/mL (ref <0.03)

## 2016-10-02 LAB — CBC WITH DIFFERENTIAL/PLATELET
BASOS ABS: 0.2 10*3/uL — AB (ref 0–0.1)
Basophils Relative: 3 %
EOS PCT: 6 %
Eosinophils Absolute: 0.4 10*3/uL (ref 0–0.7)
HCT: 27 % — ABNORMAL LOW (ref 35.0–47.0)
Hemoglobin: 8.9 g/dL — ABNORMAL LOW (ref 12.0–16.0)
LYMPHS PCT: 29 %
Lymphs Abs: 1.8 10*3/uL (ref 1.0–3.6)
MCH: 31.4 pg (ref 26.0–34.0)
MCHC: 33.2 g/dL (ref 32.0–36.0)
MCV: 94.6 fL (ref 80.0–100.0)
Monocytes Absolute: 0.5 10*3/uL (ref 0.2–0.9)
Monocytes Relative: 8 %
NEUTROS ABS: 3.5 10*3/uL (ref 1.4–6.5)
Neutrophils Relative %: 54 %
PLATELETS: 191 10*3/uL (ref 150–440)
RBC: 2.85 MIL/uL — AB (ref 3.80–5.20)
RDW: 18.5 % — ABNORMAL HIGH (ref 11.5–14.5)
WBC: 6.4 10*3/uL (ref 3.6–11.0)

## 2016-10-02 LAB — ECHOCARDIOGRAM COMPLETE
Height: 58 in
Weight: 2505.6 oz

## 2016-10-02 LAB — TSH: TSH: 0.111 u[IU]/mL — ABNORMAL LOW (ref 0.350–4.500)

## 2016-10-02 LAB — MRSA PCR SCREENING: MRSA by PCR: NEGATIVE

## 2016-10-02 MED ORDER — RISPERIDONE 1 MG PO TABS: 2.0000 mg | ORAL_TABLET | Freq: Two times a day (BID) | ORAL | Status: DC

## 2016-10-02 MED ORDER — PRAVASTATIN SODIUM 20 MG PO TABS
20.0000 mg | ORAL_TABLET | Freq: Every day | ORAL | Status: DC
  Administered 2016-10-02 – 2016-10-03 (×2): 20 mg via ORAL
  Filled 2016-10-02 (×3): qty 1

## 2016-10-02 MED ORDER — ESTRADIOL 0.1 MG/GM VA CREA
1.0000 | TOPICAL_CREAM | VAGINAL | Status: DC
  Administered 2016-10-03: 1 via VAGINAL
  Filled 2016-10-02: qty 42.5

## 2016-10-02 MED ORDER — ASPIRIN 81 MG PO CHEW: 324.0000 mg | CHEWABLE_TABLET | Freq: Once | ORAL | Status: DC

## 2016-10-02 MED ORDER — NITROGLYCERIN 0.4 MG SL SUBL: 0.4000 mg | SUBLINGUAL_TABLET | SUBLINGUAL | Status: DC | PRN

## 2016-10-02 MED ORDER — DOCUSATE SODIUM 100 MG PO CAPS
100.0000 mg | ORAL_CAPSULE | Freq: Two times a day (BID) | ORAL | Status: DC
  Administered 2016-10-02: 100 mg via ORAL
  Filled 2016-10-02 (×2): qty 1

## 2016-10-02 MED ORDER — SODIUM CHLORIDE 0.9% FLUSH
3.0000 mL | Freq: Two times a day (BID) | INTRAVENOUS | Status: DC
Start: 2016-10-02 — End: 2016-10-03
  Administered 2016-10-02: 3 mL via INTRAVENOUS

## 2016-10-02 MED ORDER — VITAMIN D (ERGOCALCIFEROL) 1.25 MG (50000 UNIT) PO CAPS: 50000.0000 [IU] | ORAL_CAPSULE | ORAL | Status: DC

## 2016-10-02 MED ORDER — TOPIRAMATE 25 MG PO TABS
50.0000 mg | ORAL_TABLET | Freq: Two times a day (BID) | ORAL | Status: DC
  Administered 2016-10-02 – 2016-10-04 (×5): 50 mg via ORAL
  Filled 2016-10-02 (×6): qty 2

## 2016-10-02 MED ORDER — CLOPIDOGREL BISULFATE 75 MG PO TABS
300.0000 mg | ORAL_TABLET | Freq: Once | ORAL | Status: AC
  Administered 2016-10-02: 300 mg via ORAL
  Filled 2016-10-02: qty 4

## 2016-10-02 MED ORDER — ONDANSETRON HCL 4 MG PO TABS: 4.0000 mg | ORAL_TABLET | Freq: Four times a day (QID) | ORAL | Status: DC | PRN

## 2016-10-02 MED ORDER — FUROSEMIDE 40 MG PO TABS
40.0000 mg | ORAL_TABLET | Freq: Two times a day (BID) | ORAL | Status: DC
  Administered 2016-10-02 – 2016-10-03 (×4): 40 mg via ORAL
  Filled 2016-10-02 (×6): qty 1

## 2016-10-02 MED ORDER — HYDROMORPHONE HCL 2 MG PO TABS
4.0000 mg | ORAL_TABLET | ORAL | Status: DC | PRN
  Administered 2016-10-02 – 2016-10-03 (×2): 4 mg via ORAL
  Filled 2016-10-02 (×2): qty 2

## 2016-10-02 MED ORDER — SODIUM CHLORIDE 0.9 % IV SOLN: 250.0000 mL | INTRAVENOUS | Status: DC | PRN

## 2016-10-02 MED ORDER — ACETAMINOPHEN 325 MG PO TABS: 650.0000 mg | ORAL_TABLET | Freq: Four times a day (QID) | ORAL | Status: DC | PRN

## 2016-10-02 MED ORDER — LEVOTHYROXINE SODIUM 112 MCG PO TABS
112.0000 ug | ORAL_TABLET | Freq: Every day | ORAL | Status: DC
  Administered 2016-10-02 – 2016-10-04 (×3): 112 ug via ORAL
  Filled 2016-10-02 (×3): qty 1

## 2016-10-02 MED ORDER — SODIUM CHLORIDE 0.9 % WEIGHT BASED INFUSION: 3.0000 mL/kg/h | INTRAVENOUS | Status: AC

## 2016-10-02 MED ORDER — ONDANSETRON HCL 4 MG/2ML IJ SOLN: 4.0000 mg | Freq: Four times a day (QID) | INTRAMUSCULAR | Status: DC | PRN

## 2016-10-02 MED ORDER — RISPERIDONE 1 MG PO TABS
2.0000 mg | ORAL_TABLET | Freq: Every day | ORAL | Status: DC
  Administered 2016-10-02 – 2016-10-03 (×2): 2 mg via ORAL
  Filled 2016-10-02 (×2): qty 2

## 2016-10-02 MED ORDER — ACETAMINOPHEN 650 MG RE SUPP: 650.0000 mg | Freq: Four times a day (QID) | RECTAL | Status: DC | PRN

## 2016-10-02 MED ORDER — HYDROCORTISONE 10 MG PO TABS
5.0000 mg | ORAL_TABLET | Freq: Every day | ORAL | Status: DC
  Administered 2016-10-02 – 2016-10-03 (×2): 5 mg via ORAL
  Filled 2016-10-02: qty 1
  Filled 2016-10-02: qty 2

## 2016-10-02 MED ORDER — MORPHINE SULFATE (PF) 4 MG/ML IV SOLN: 2.0000 mg | INTRAVENOUS | Status: DC | PRN

## 2016-10-02 MED ORDER — SERTRALINE HCL 50 MG PO TABS
50.0000 mg | ORAL_TABLET | Freq: Every day | ORAL | Status: DC
  Administered 2016-10-02 – 2016-10-04 (×3): 50 mg via ORAL
  Filled 2016-10-02 (×3): qty 1

## 2016-10-02 MED ORDER — SEVELAMER CARBONATE 800 MG PO TABS
2400.0000 mg | ORAL_TABLET | Freq: Two times a day (BID) | ORAL | Status: DC
  Administered 2016-10-02: 2400 mg via ORAL
  Filled 2016-10-02 (×4): qty 3

## 2016-10-02 MED ORDER — HYDROCORTISONE 10 MG PO TABS: 5.0000 mg | ORAL_TABLET | Freq: Two times a day (BID) | ORAL | Status: DC

## 2016-10-02 MED ORDER — TOPIRAMATE 100 MG PO TABS
200.0000 mg | ORAL_TABLET | Freq: Two times a day (BID) | ORAL | Status: DC
  Administered 2016-10-02 – 2016-10-04 (×5): 200 mg via ORAL
  Filled 2016-10-02 (×6): qty 2

## 2016-10-02 MED ORDER — HYDROMORPHONE HCL 2 MG PO TABS: 6.0000 mg | ORAL_TABLET | Freq: Three times a day (TID) | ORAL | Status: DC | PRN

## 2016-10-02 MED ORDER — PANTOPRAZOLE SODIUM 40 MG PO TBEC
40.0000 mg | DELAYED_RELEASE_TABLET | Freq: Every day | ORAL | Status: DC
  Administered 2016-10-02 – 2016-10-04 (×3): 40 mg via ORAL
  Filled 2016-10-02 (×3): qty 1

## 2016-10-02 MED ORDER — EPOETIN ALFA 10000 UNIT/ML IJ SOLN
10000.0000 [IU] | INTRAMUSCULAR | Status: DC
  Administered 2016-10-04: 10000 [IU] via INTRAVENOUS

## 2016-10-02 MED ORDER — MELATONIN 5 MG PO TABS
2.0000 | ORAL_TABLET | Freq: Every day | ORAL | Status: DC
  Administered 2016-10-02 – 2016-10-03 (×2): 10 mg via ORAL
  Filled 2016-10-02 (×3): qty 2

## 2016-10-02 MED ORDER — POLYETHYLENE GLYCOL 3350 17 GM/SCOOP PO POWD: 1.0000 | Freq: Two times a day (BID) | ORAL | Status: DC | PRN

## 2016-10-02 MED ORDER — LORATADINE 10 MG PO TABS
10.0000 mg | ORAL_TABLET | Freq: Every day | ORAL | Status: DC
  Administered 2016-10-02 – 2016-10-04 (×3): 10 mg via ORAL
  Filled 2016-10-02 (×3): qty 1

## 2016-10-02 MED ORDER — RISPERIDONE 1 MG PO TABS
4.0000 mg | ORAL_TABLET | Freq: Every morning | ORAL | Status: DC
  Administered 2016-10-02 – 2016-10-04 (×3): 4 mg via ORAL
  Filled 2016-10-02 (×3): qty 4

## 2016-10-02 MED ORDER — NYSTATIN 100000 UNIT/GM EX OINT
1.0000 "application " | TOPICAL_OINTMENT | Freq: Two times a day (BID) | CUTANEOUS | Status: DC
  Administered 2016-10-02 – 2016-10-03 (×3): 1 via TOPICAL
  Filled 2016-10-02: qty 15

## 2016-10-02 MED ORDER — HYDROCORTISONE 10 MG PO TABS
10.0000 mg | ORAL_TABLET | Freq: Every morning | ORAL | Status: DC
  Administered 2016-10-02 – 2016-10-04 (×3): 10 mg via ORAL
  Filled 2016-10-02 (×3): qty 1

## 2016-10-02 MED ORDER — SODIUM CHLORIDE 0.9% FLUSH: 3.0000 mL | INTRAVENOUS | Status: DC | PRN

## 2016-10-02 MED ORDER — SODIUM CHLORIDE 0.9% FLUSH
3.0000 mL | Freq: Two times a day (BID) | INTRAVENOUS | Status: DC
  Administered 2016-10-02 – 2016-10-04 (×5): 3 mL via INTRAVENOUS

## 2016-10-02 MED ORDER — HEPARIN SODIUM (PORCINE) 5000 UNIT/ML IJ SOLN
5000.0000 [IU] | Freq: Three times a day (TID) | INTRAMUSCULAR | Status: DC
  Filled 2016-10-02 (×2): qty 1

## 2016-10-02 MED ORDER — FLUDROCORTISONE ACETATE 0.1 MG PO TABS
0.1000 mg | ORAL_TABLET | Freq: Every day | ORAL | Status: DC
  Administered 2016-10-02 – 2016-10-04 (×3): 0.1 mg via ORAL
  Filled 2016-10-02 (×3): qty 1

## 2016-10-02 MED ORDER — ASPIRIN 81 MG PO CHEW
81.0000 mg | CHEWABLE_TABLET | ORAL | Status: AC
  Administered 2016-10-03: 81 mg via ORAL
  Filled 2016-10-02: qty 1

## 2016-10-02 MED ORDER — POLYETHYLENE GLYCOL 3350 17 G PO PACK: 17.0000 g | PACK | Freq: Two times a day (BID) | ORAL | Status: DC | PRN

## 2016-10-02 MED ORDER — NITROGLYCERIN 0.4 MG SL SUBL
0.4000 mg | SUBLINGUAL_TABLET | SUBLINGUAL | Status: DC | PRN
  Administered 2016-10-02: 0.4 mg via SUBLINGUAL
  Filled 2016-10-02: qty 1

## 2016-10-02 MED ORDER — MORPHINE SULFATE (PF) 4 MG/ML IV SOLN
1.0000 mg | INTRAVENOUS | Status: DC | PRN
  Administered 2016-10-02: 1 mg via INTRAVENOUS
  Filled 2016-10-02 (×2): qty 1

## 2016-10-02 MED ORDER — SODIUM CHLORIDE 0.9 % WEIGHT BASED INFUSION
1.0000 mL/kg/h | INTRAVENOUS | Status: DC
  Administered 2016-10-03: 1 mL/kg/h via INTRAVENOUS

## 2016-10-02 MED ORDER — GABAPENTIN 100 MG PO CAPS
400.0000 mg | ORAL_CAPSULE | Freq: Every day | ORAL | Status: DC
  Administered 2016-10-02 – 2016-10-03 (×2): 400 mg via ORAL
  Filled 2016-10-02 (×2): qty 1

## 2016-10-02 MED ORDER — MORPHINE SULFATE (PF) 2 MG/ML IV SOLN
INTRAVENOUS | Status: AC
  Administered 2016-10-02: 2 mg via INTRAVENOUS
  Filled 2016-10-02: qty 1
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
## 2016-10-02 NOTE — Progress Notes (Signed)
Hd started  

## 2016-10-02 NOTE — Progress Notes (Signed)
[facility]  ROUNDING NOTE   Subjective:  Patient known to us from outpatient hemodialysis. She states that she went to dialysis last on Thursday. She missed dialysis yesterday because she developed chest pain. She was admitted for chest pain that is being further evaluated by the hospitalist. She is due for hemodialysis today.    Objective:  Vital signs in last 24 hours:  Temp:  [98 F (36.7 C)-98.1 F (36.7 C)] 98.1 F (36.7 C) (12/03 1211) Pulse Rate:  [79-86] 80 (12/03 1211) Resp:  [14-22] 18 (12/03 1211) BP: (127-165)/(60-96) 137/61 (12/03 1211) SpO2:  [96 %-100 %] 96 % (12/03 1211) Weight:  [71 kg (156 lb 9.6 oz)-74 kg (163 lb 2.3 oz)] 71 kg (156 lb 9.6 oz) (12/03 0525)  Weight change:  Filed Weights   10/02/16 0111 10/02/16 0525  Weight: 74 kg (163 lb 2.3 oz) 71 kg (156 lb 9.6 oz)    Intake/Output: No intake/output data recorded.   Intake/Output this shift:  No intake/output data recorded.  Physical Exam: General: No acute distress  Head: Normocephalic, atraumatic. Moist oral mucosal membranes  Eyes: Anicteric  Neck: Supple, trachea midline  Lungs:  Clear to auscultation, normal effort  Heart: S1S2 no rubs  Abdomen:  Soft, nontender, bowel sounds present  Extremities: trace peripheral edema.  Neurologic: Nonfocal, moving all four extremities  Skin: No lesions  Access: RUE AVG    Basic Metabolic Panel:  Recent Labs Lab 10/02/16 0155  NA 138  K 4.4  CL 111  CO2 17*  GLUCOSE 78  BUN 33*  CREATININE 9.93*  CALCIUM 8.0*    Liver Function Tests:  Recent Labs Lab 10/02/16 0155  AST 28  ALT 9*  ALKPHOS 333*  BILITOT 1.2  PROT 5.7*  ALBUMIN 2.9*   No results for input(s): LIPASE, AMYLASE in the last 168 hours. No results for input(s): AMMONIA in the last 168 hours.  CBC:  Recent Labs Lab 10/02/16 0155  WBC 6.4  NEUTROABS 3.5  HGB 8.9*  HCT 27.0*  MCV 94.6  PLT 191    Cardiac Enzymes:  Recent Labs Lab  10/02/16 0155  TROPONINI 0.12*    BNP: Invalid input(s): POCBNP  CBG: No results for input(s): GLUCAP in the last 168 hours.  Microbiology: Results for orders placed or performed during the hospital encounter of 10/02/16  MRSA PCR Screening     Status: None   Collection Time: 10/02/16  6:33 AM  Result Value Ref Range Status   MRSA by PCR NEGATIVE NEGATIVE Final    Comment:        The GeneXpert MRSA Assay (FDA approved for NASAL specimens only), is one component of a comprehensive MRSA colonization surveillance program. It is not intended to diagnose MRSA infection nor to guide or monitor treatment for MRSA infections.     Coagulation Studies: No results for input(s): LABPROT, INR in the last 72 hours.  Urinalysis: No results for input(s): COLORURINE, LABSPEC, PHURINE, GLUCOSEU, HGBUR, BILIRUBINUR, KETONESUR, PROTEINUR, UROBILINOGEN, NITRITE, LEUKOCYTESUR in the last 72 hours.  Invalid input(s): APPERANCEUR    Imaging: Dg Chest 2 View  Result Date: 10/02/2016 CLINICAL DATA:  Acute onset of generalized chest pain and nausea. Initial encounter. EXAM: CHEST  2 VIEW COMPARISON:  Chest radiograph performed 09/17/2016 FINDINGS: The lungs are well-aerated. Mild peribronchial thickening is noted. There is no evidence of focal opacification, pleural effusion or pneumothorax. The heart is mildly enlarged. No acute osseous abnormalities are seen. Clips are noted within the right upper  quadrant, reflecting prior cholecystectomy. IMPRESSION: Mild cardiomegaly. Mild peribronchial thickening noted. Lungs otherwise grossly clear. Electronically Signed   By: Roanna Raider M.D.   On: 10/02/2016 02:20     Medications:    . docusate sodium  100 mg Oral BID  . [START ON 10/03/2016] estradiol  1 Applicatorful Vaginal Once per day on Mon Thu  . fludrocortisone  0.1 mg Oral Daily  . furosemide  40 mg Oral BID  . gabapentin  400 mg Oral QHS  . heparin  5,000 Units Subcutaneous Q8H  .  hydrocortisone  10 mg Oral q morning - 10a  . hydrocortisone  5 mg Oral QHS  . levothyroxine  112 mcg Oral QAC breakfast  . loratadine  10 mg Oral Daily  . Melatonin  2 tablet Oral QHS  . nystatin ointment  1 application Topical BID  . pantoprazole  40 mg Oral Daily  . pravastatin  20 mg Oral q1800  . risperiDONE  2 mg Oral QHS  . risperidone  4 mg Oral q morning - 10a  . sertraline  50 mg Oral Daily  . sevelamer carbonate  2,400 mg Oral BID  . sodium chloride flush  3 mL Intravenous Q12H  . topiramate  200 mg Oral BID  . topiramate  50 mg Oral BID  . [START ON 10/07/2016] Vitamin D (Ergocalciferol)  50,000 Units Oral Q30 days   acetaminophen **OR** acetaminophen, HYDROmorphone, morphine injection, nitroGLYCERIN, ondansetron **OR** ondansetron (ZOFRAN) IV, polyethylene glycol  Assessment/ Plan:  45 y.o. female with h/o asthma, bipolar disorder, hyperlipidemia, hypothyroidism, panhypopituitarism, ESRD on HD, anemia of CKD, SHPTH, RUE AVG who presents with chest pain.  [facility]/TTS-2  1. ESRD on HD TTS. Patient missed her regularly scheduled dialysis yesterday. Therefore we will plan for a short hemodialysis treatment today. Thereafter we can hopefully get her back on her usual schedule on Tuesday.  2. Anemia of chronic kidney disease. Hemoglobin currently 8.9. Start the patient on Epogen 10,000 units IV with dialysis.  3. Secondary hyperparathyroidism. Check intact PTH and phosphorus with dialysis. Continue Renvela 2400 mg by mouth 3 times a day with meals.  4. Chest pain. First troponin was 0.12. Continue to trend as per hospitalist.  Agree with cardiology consultation.   LOS: 0 Taren Dymek 12/3/20171:01 PM

## 2016-10-02 NOTE — Progress Notes (Signed)
POST DIALYSIS ASSESSMENT 

## 2016-10-02 NOTE — ED Triage Notes (Signed)
Patient presents to Emergency Department via EMS with complaints of chest pain for the last 3 hours, nausea for the last week, back pain for the 3 days, dialysis pt missed on Saturday.  Per EMS pt possible GI bleed d/t possibility of stool mixed with blood in pt's diaper.

## 2016-10-02 NOTE — Progress Notes (Signed)
Initial Nutrition Assessment  DOCUMENTATION CODES:   Obesity unspecified  INTERVENTION:  -Pt declined any interventions at this time  NUTRITION DIAGNOSIS:   Inadequate oral intake related to poor appetite as evidenced by per patient/family report.  GOAL:   Patient will meet greater than or equal to 90% of their needs  MONITOR:   PO intake, I & O's, Labs, Weight trends, Diet advancement  REASON FOR ASSESSMENT:   Malnutrition Screening Tool    ASSESSMENT:    The patient with past medical history of panhypopituitarism and end-stage renal disease on dialysis presents emergency department complaining of chest pain. The patient states her pain began at rest approximately 3 hours prior to presentation  Spoke with pt briefly at bedside. She is legally blind. She complains of poor appetite for ~3 weeks, states she hasn't eaten "anything" during that time. Pt is unsure of weight loss. Per chart review - she exhibits weight fluctuations - most recently 9#/5.4% severe wt loss over 1 month. She does not exhibit any signs of muscle wasting or fat depletions. No documented meal completion thus far. Labs and medications reviewed: Colace, EPO injections, Melatonin, Vitamin D   Diet Order:  Diet renal with fluid restriction Fluid restriction: 1200 mL Fluid; Room service appropriate? Yes; Fluid consistency: Thin  Skin:  Reviewed, no issues  Last BM:  10/01/2016  Height:   Ht Readings from Last 1 Encounters:  10/02/16 4\' 10"  (1.473 m)    Weight:   Wt Readings from Last 1 Encounters:  10/02/16 156 lb 9.6 oz (71 kg)    Ideal Body Weight:  40.9 kg  BMI:  Body mass index is 32.73 kg/m.  Estimated Nutritional Needs:   Kcal:  1800-2100 calories  Protein:  70-85 gm  Fluid:  UOP + 1L  EDUCATION NEEDS:   No education needs identified at this time  Dionne Ano. Shareta Fishbaugh, MS, RD LDN Inpatient Clinical Dietitian Pager [phone]

## 2016-10-02 NOTE — H&P (Signed)
Erin [name] is an 45 y.o. female.   Chief Complaint: Chest pain HPI: The patient with past medical history of panhypopituitarism and end-stage renal disease on dialysis presents emergency department complaining of chest pain. The patient states her pain began at rest approximately 3 hours prior to presentation. She received nitroglycerin in the emergency department which brought her pain from an 8 out of 10 down to 3 out of 10. The pain was located over her left breast and did not radiate. The patient denies associated nausea, vomiting or diaphoresis. However she admits to the effort mentioned symptoms in the week preceding today's encounter. She also complains of chronic back pain as well as subjective fever. She denies shortness of breath. Initial troponin was elevated although the patient does not have EKG changes. Due to her risk factors and ongoing chest pain the emergency department staff called the hospitalist service for admission.  Past Medical History:  Diagnosis Date  . Anxiety   . Asthma   . Bipolar affect, depressed ([place])   . Blind   . Chronic headaches    Seeing Pain Management  . Constipation   . Depression   . Epilepsy ([place])   . Fibromyalgia   . History of CVA (cerebrovascular accident)   . History of DVT (deep vein thrombosis)   . Hyperlipidemia   . Hypothyroid   . Kidney failure    Currently on Dialysis  . Narcolepsy   . 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 Psychosis     Past Surgical History:  Procedure Laterality Date  . BRAIN SURGERY  1976  . BREAST LUMPECTOMY  2001  . CHOLECYSTECTOMY  2001  . DG AV DIALYSIS GRAFT DECLOT OR     X 4  . GIVENS CAPSULE STUDY N/A 02/29/2016   Procedure: GIVENS CAPSULE STUDY;  Surgeon: Josefine Class, MD;  Location: [facility] ENDOSCOPY;  Service: Endoscopy;  Laterality: N/A;  . PERIPHERAL VASCULAR CATHETERIZATION N/A 08/21/2015   Procedure: Dialysis/Perma Catheter Insertion;   Surgeon: Katha Cabal, MD;  Location: [place] CV LAB;  Service: Cardiovascular;  Laterality: N/A;  . PERIPHERAL VASCULAR CATHETERIZATION N/A 02/29/2016   Procedure: Dialysis/Perma Catheter Removal;  Surgeon: Algernon Huxley, MD;  Location: [place] CV LAB;  Service: Cardiovascular;  Laterality: N/A;  . PITUITARY EXCISION  1976    Family History  Problem Relation Age of Onset  . Heart attack Mother   . Migraines Mother   . Aneurysm Mother   . Diabetes Father    Social History:  reports that she has been smoking Cigarettes.  She has a 8.00 pack-year smoking history. She has never used smokeless tobacco. She reports that she does not drink alcohol or use drugs.  Allergies:  Allergies  Allergen Reactions  . Dhea [Nutritional Supplements] Anaphylaxis    Patient states medication is DHE for migraines, not DHEA  . Demerol [Meperidine] Other (See Comments)    Reaction:  Hallucinations   . Floxin [Ofloxacin] Other (See Comments)    Reaction:  Hallucinations   . Nsaids Nausea And Vomiting and Swelling  . Nubain [Nalbuphine Hcl] Other (See Comments)    Reaction:  Hallucinations   . Phenergan [Promethazine Hcl] Other (See Comments)    Reaction:  Restless legs   . Stadol [Butorphanol] Other (See Comments)    Reaction:  Hallucinations   . Erythromycin Diarrhea and Rash    Medications Prior to Admission  Medication Sig Dispense Refill  . estradiol (ESTRACE) 0.1 MG/GM vaginal cream  Place 1 Applicatorful vaginally 2 (two) times a week. Mondays and Thursdays    . fenofibrate (TRICOR) 145 MG tablet Take 145 mg by mouth every other day.    . fludrocortisone (FLORINEF) 0.1 MG tablet Take 0.1 mg by mouth daily.    . furosemide (LASIX) 40 MG tablet Take 40 mg by mouth 2 (two) times daily.    Marland Kitchen gabapentin (NEURONTIN) 100 MG capsule Take 400 mg by mouth at bedtime.    . hydrocortisone (CORTEF) 10 MG tablet Take 5-10 mg by mouth 2 (two) times daily. Pt takes 10 mg in the morning and 5 mg at  bedtime.    Marland Kitchen levothyroxine (SYNTHROID, LEVOTHROID) 112 MCG tablet Take 112 mcg by mouth daily.     Marland Kitchen loratadine (CLARITIN) 10 MG tablet Take 10 mg by mouth daily.    Marland Kitchen lovastatin (MEVACOR) 40 MG tablet Take 20 mg by mouth every evening.     . Melatonin 5 MG TABS Take 2 tablets by mouth at bedtime.    . methylphenidate (RITALIN LA) 30 MG 24 hr capsule Take 30 mg by mouth every morning.    . pantoprazole (PROTONIX) 40 MG tablet Take 1 tablet (40 mg total) by mouth daily. 30 tablet 0  . risperidone (RISPERDAL) 4 MG tablet Take 2-4 mg by mouth 2 (two) times daily. Pt takes 4 mg in the morning and 2 mg at bedtime.    . sertraline (ZOLOFT) 50 MG tablet Take 50 mg by mouth daily.    . sevelamer carbonate (RENVELA) 800 MG tablet Take 2,400 mg by mouth 2 (two) times daily.     Marland Kitchen topiramate (TOPAMAX) 200 MG tablet Take 200 mg by mouth 2 (two) times daily. Pt takes with a 50 mg tablet.    . topiramate (TOPAMAX) 50 MG tablet Take 50 mg by mouth 2 (two) times daily. Pt takes with a 200 mg tablet.    . traZODone (DESYREL) 100 MG tablet Take 100 mg by mouth at bedtime.    . Vitamin D, Ergocalciferol, (DRISDOL) 50000 UNITS CAPS capsule Take 50,000 Units by mouth every 30 (thirty) days. Takes on the 8th of every month    . nystatin ointment (MYCOSTATIN) Apply 1 application topically 2 (two) times daily. Apply to affected area (groin, abdominal fold). 30 g 0    Results for orders placed or performed during the hospital encounter of 10/02/16 (from the past 48 hour(s))  Troponin I     Status: Abnormal   Collection Time: 10/02/16  1:55 AM  Result Value Ref Range   Troponin I 0.12 (HH) <0.03 ng/mL    Comment: CRITICAL RESULT CALLED TO, READ BACK BY AND VERIFIED WITH NOEL WEBSTER AT 0226 10/02/16.PMH  Comprehensive metabolic panel     Status: Abnormal   Collection Time: 10/02/16  1:55 AM  Result Value Ref Range   Sodium 138 135 - 145 mmol/L   Potassium 4.4 3.5 - 5.1 mmol/L   Chloride 111 101 - 111 mmol/L   CO2  17 (L) 22 - 32 mmol/L   Glucose, Bld 78 65 - 99 mg/dL   BUN 33 (H) 6 - 20 mg/dL   Creatinine, Ser 9.93 (H) 0.44 - 1.00 mg/dL   Calcium 8.0 (L) 8.9 - 10.3 mg/dL   Total Protein 5.7 (L) 6.5 - 8.1 g/dL   Albumin 2.9 (L) 3.5 - 5.0 g/dL   AST 28 15 - 41 U/L   ALT 9 (L) 14 - 54 U/L   Alkaline Phosphatase 333 (H)  38 - 126 U/L   Total Bilirubin 1.2 0.3 - 1.2 mg/dL   GFR calc non Af Amer 4 (L) >60 mL/min   GFR calc Af Amer 5 (L) >60 mL/min    Comment: (NOTE) The eGFR has been calculated using the CKD EPI equation. This calculation has not been validated in all clinical situations. eGFR's persistently <60 mL/min signify possible Chronic Kidney Disease.    Anion gap 10 5 - 15  CBC with Differential/Platelet     Status: Abnormal   Collection Time: 10/02/16  1:55 AM  Result Value Ref Range   WBC 6.4 3.6 - 11.0 K/uL   RBC 2.85 (L) 3.80 - 5.20 MIL/uL   Hemoglobin 8.9 (L) 12.0 - 16.0 g/dL   HCT 27.0 (L) 35.0 - 47.0 %   MCV 94.6 80.0 - 100.0 fL   MCH 31.4 26.0 - 34.0 pg   MCHC 33.2 32.0 - 36.0 g/dL   RDW 18.5 (H) 11.5 - 14.5 %   Platelets 191 150 - 440 K/uL   Neutrophils Relative % 54 %   Neutro Abs 3.5 1.4 - 6.5 K/uL   Lymphocytes Relative 29 %   Lymphs Abs 1.8 1.0 - 3.6 K/uL   Monocytes Relative 8 %   Monocytes Absolute 0.5 0.2 - 0.9 K/uL   Eosinophils Relative 6 %   Eosinophils Absolute 0.4 0 - 0.7 K/uL   Basophils Relative 3 %   Basophils Absolute 0.2 (H) 0 - 0.1 K/uL   Dg Chest 2 View  Result Date: 10/02/2016 CLINICAL DATA:  Acute onset of generalized chest pain and nausea. Initial encounter. EXAM: CHEST  2 VIEW COMPARISON:  Chest radiograph performed 09/17/2016 FINDINGS: The lungs are well-aerated. Mild peribronchial thickening is noted. There is no evidence of focal opacification, pleural effusion or pneumothorax. The heart is mildly enlarged. No acute osseous abnormalities are seen. Clips are noted within the right upper quadrant, reflecting prior cholecystectomy. IMPRESSION: Mild  cardiomegaly. Mild peribronchial thickening noted. Lungs otherwise grossly clear. Electronically Signed   By: Garald Balding M.D.   On: 10/02/2016 02:20    Review of Systems  Constitutional: Positive for fever (subjective). Negative for chills.  HENT: Negative for sore throat and tinnitus.   Eyes: Negative for blurred vision and redness.  Respiratory: Negative for cough and shortness of breath.   Cardiovascular: Positive for chest pain. Negative for palpitations, orthopnea and PND.  Gastrointestinal: Positive for diarrhea and nausea. Negative for abdominal pain and vomiting.  Genitourinary: Negative for dysuria, frequency and urgency.  Musculoskeletal: Positive for back pain. Negative for joint pain and myalgias.  Skin: Negative for rash.       No lesions  Neurological: Positive for headaches. Negative for speech change, focal weakness and weakness.  Endo/Heme/Allergies: Does not bruise/bleed easily.       No temperature intolerance  Psychiatric/Behavioral: Negative for depression and suicidal ideas.    Blood pressure (!) 165/71, pulse 81, temperature 98 F (36.7 C), temperature source Oral, resp. rate 18, height 4' 10"  (1.473 m), weight 74 kg (163 lb 2.3 oz), last menstrual period 11/21/2007, SpO2 100 %. Physical Exam  Vitals reviewed. Constitutional: She is oriented to person, place, and time. She appears well-developed and well-nourished. No distress.  HENT:  Head: Normocephalic and atraumatic.  Mouth/Throat: Oropharynx is clear and moist.  Eyes: Conjunctivae and EOM are normal. Pupils are equal, round, and reactive to light. No scleral icterus.  Neck: Normal range of motion. Neck supple. No JVD present. No tracheal deviation present. No  thyromegaly present.  Cardiovascular: Normal rate, regular rhythm and normal heart sounds.  Exam reveals no gallop and no friction rub.   No murmur heard. Respiratory: Effort normal and breath sounds normal.  GI: Soft. Bowel sounds are normal. She  exhibits no distension. There is no tenderness.  Genitourinary:  Genitourinary Comments: deferred  Musculoskeletal: Normal range of motion. She exhibits edema (trace).  fistula right upper extremity  Lymphadenopathy:    She has no cervical adenopathy.  Neurological: She is alert and oriented to person, place, and time. No cranial nerve deficit. She exhibits normal muscle tone.  Skin: Skin is warm and dry. No rash noted. No erythema.  Eczematous skin changes; bronzing of the skin  Psychiatric: She has a normal mood and affect. Her behavior is normal. Judgment and thought content normal.     Assessment/Plan This is a 45 year old female admitted for chest pain. 1. Chest pain: Elevated troponin and chest pain consistent with possible NSTEMI. Notably however, the patient has missed dialysis today which may contribute to intermittent uncontrolled hypertension as well as poor clearance of elevated troponin secondary to demand ischemia. Follow cardiac enzymes. Start therapeutic heparin if biomarkers trend upward. Nitroglycerin for pain. 2. ESRD: On hemodialysis; consult nephrology for continuation of treatment. Continue Renvela 3. Panhypopituitarism: Continue hydrocortisone, fludrocortisone and Synthroid 3. Hyperlipidemia: Continue statin therapy 4. Epilepsy: Continue Topamax 5. Mood disorder: Continue Risperdal and Zoloft 6. DVT prophylaxis: Heparin 7. GI prophylaxis: Pantoprazole per home regimen The patient is a full code. Time spent on admission orders and patient care approximately 45 minutes   Harrie Foreman, MD 10/02/2016, 5:28 AM

## 2016-10-02 NOTE — Progress Notes (Signed)
HD COMPLETED  

## 2016-10-02 NOTE — ED Notes (Signed)
CRITICAL LAB: Troponin is 0.12, [employer], Dr. Derrill Kay notified, orders recieved

## 2016-10-02 NOTE — Progress Notes (Signed)
*  PRELIMINARY RESULTS* Echocardiogram 2D Echocardiogram has been performed.  Erin [name] 10/02/2016, 11:20 AM

## 2016-10-02 NOTE — Progress Notes (Signed)
Spoke to Dr. Clint Guy, patient can have renal diet.

## 2016-10-02 NOTE — Progress Notes (Signed)
Patient remains very drowsy, still arousable to voice and answer questions appropriately but quickly falls back to sleep. Spoke to Dr. Amado Coe, order for neuro checks every 3 hours and alert MD of any changes, no other orders.   No option to order neuro checks Q3h in [facility]; will set for Q2h and leave comment in order that every 3 hours is okay per MD.

## 2016-10-02 NOTE — ED Provider Notes (Signed)
[facility] Emergency Department Provider Note    ____________________________________________   I have reviewed the triage vital signs and the nursing notes.   HISTORY  Chief Complaint Chest pain  History limited by: Not Limited   HPI Erin [name] is a 45 y.o. female with history of end-stage renal disease who presents to the emergency department today because of concerns for chest pain. She states it happened a couple hours prior to arrival to the emergency department. She describes as being located in left chest. She describes it is pressure-like. She states that it did radiate slightly into her arm. She denies any associated shortness of breath. She denies any associated diaphoresis or feeling of being flushed. The patient states that her mother did have a history of heart disease. She denies any heart disease history herself. She denies having seen a cardiologist although she states that she was told and had a cardiologist appointment scheduled however she did not go. She did miss dialysis today.   Past Medical History:  Diagnosis Date  . Anxiety   . Asthma   . Bipolar affect, depressed ([facility])   . Blind   . Chronic headaches    Seeing Pain Management  . Constipation   . Depression   . Epilepsy ([facility])   . Fibromyalgia   . History of CVA (cerebrovascular accident)   . History of DVT (deep vein thrombosis)   . Hyperlipidemia   . Hypothyroid   . Kidney failure    Currently on Dialysis  . Narcolepsy   . 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 Psychosis     Patient Active Problem List   Diagnosis Date Noted  . Kidney failure   . Palliative care encounter   . Goals of care, counseling/discussion   . Hypoglycemia 06/27/2016  . Fungal rash of trunk 06/27/2016  . Seizure disorder ([facility]) 06/27/2016  . Blindness 06/27/2016  . Altered mental status 06/27/2016  . Chronic pain 06/27/2016  . History of CVA  (cerebrovascular accident) 06/27/2016  . GI bleed 03/04/2016  . Generalized weakness 03/04/2016  . Hypothyroidism 03/04/2016  . GERD (gastroesophageal reflux disease) 03/04/2016  . End stage renal disease on dialysis ([facility]) 03/04/2016  . Bipolar 1 disorder, mixed, moderate ([facility]) 02/26/2016  . 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 Anemia 02/25/2016    Past Surgical History:  Procedure Laterality Date  . BRAIN SURGERY  1976  . BREAST LUMPECTOMY  2001  . CHOLECYSTECTOMY  2001  . DG AV DIALYSIS GRAFT DECLOT OR     X 4  . GIVENS CAPSULE STUDY N/A 02/29/2016   Procedure: GIVENS CAPSULE STUDY;  Surgeon: Elnita MaxwellMatthew Gordon Rein, MD;  [facility] ENDOSCOPY;  Service: Endoscopy;  Laterality: N/A;  . PERIPHERAL VASCULAR CATHETERIZATION N/A 08/21/2015   Procedure: Dialysis/Perma Catheter Insertion;  Surgeon: [name], MD;  Location: [facility] INVASIVE CV LAB;  Service: Cardiovascular;  Laterality: N/A;  . PERIPHERAL VASCULAR CATHETERIZATION N/A 02/29/2016   Procedure: Dialysis/Perma Catheter Removal;  Surgeon: [name], MD;  Location: [facility] INVASIVE CV LAB;  Service: Cardiovascular;  Laterality: N/A;  . PITUITARY EXCISION  1976    Prior to Admission medications   Medication Sig Start Date End Date Taking? Authorizing Provider  estradiol (ESTRACE) 0.1 MG/GM vaginal cream Place 1 Applicatorful vaginally 2 (two) times a week. Mondays and Thursdays    Historical Provider, MD  fludrocortisone (FLORINEF) 0.1 MG tablet Take 0.1 mg by mouth daily.  Historical Provider, MD  furosemide (LASIX) 40 MG tablet Take 40 mg by mouth 2 (two) times daily.    Historical Provider, MD  gabapentin (NEURONTIN) 100 MG capsule Take 400 mg by mouth at bedtime.    Historical Provider, MD  hydrocortisone (CORTEF) 10 MG tablet Take 5-10 mg by mouth 2 (two) times daily. Pt takes 10 mg in the morning and 5 mg at bedtime.    Historical Provider, MD   HYDROmorphone (DILAUDID) 4 MG tablet Take 1.5 tablets (6 mg total) by mouth every 8 (eight) hours as needed for moderate pain or severe pain. 06/30/16   Standley Brooking, MD  levothyroxine (SYNTHROID, LEVOTHROID) 112 MCG tablet Take 112 mcg by mouth daily.     Historical Provider, MD  loratadine (CLARITIN) 10 MG tablet Take 10 mg by mouth daily.    Historical Provider, MD  lovastatin (MEVACOR) 40 MG tablet Take 20 mg by mouth every evening.     Historical Provider, MD  Melatonin 5 MG TABS Take 2 tablets by mouth at bedtime.    Historical Provider, MD  nystatin ointment (MYCOSTATIN) Apply 1 application topically 2 (two) times daily. Apply to affected area (groin, abdominal fold). 06/30/16   Standley Brooking, MD  pantoprazole (PROTONIX) 40 MG tablet Take 1 tablet (40 mg total) by mouth daily. 02/27/16   [name], MD  polyethylene glycol powder (MIRALAX) powder Mix one dose or 17 g in 8 ounces of water,  take 1 dose every 30 minutes for 2-3 hours or until you  get good results and then once or twice daily to prevent constipation. 07/04/16   Devoria Albe, MD  risperidone (RISPERDAL) 4 MG tablet Take 2-4 mg by mouth 2 (two) times daily. Pt takes 4 mg in the morning and 2 mg at bedtime.    Historical Provider, MD  sertraline (ZOLOFT) 50 MG tablet Take 50 mg by mouth daily.    Historical Provider, MD  sevelamer carbonate (RENVELA) 800 MG tablet Take 2,400 mg by mouth 2 (two) times daily.     Historical Provider, MD  topiramate (TOPAMAX) 200 MG tablet Take 200 mg by mouth 2 (two) times daily. Pt takes with a 50 mg tablet.    Historical Provider, MD  topiramate (TOPAMAX) 50 MG tablet Take 50 mg by mouth 2 (two) times daily. Pt takes with a 200 mg tablet.    Historical Provider, MD  Vitamin D, Ergocalciferol, (DRISDOL) 50000 UNITS CAPS capsule Take 50,000 Units by mouth every 30 (thirty) days. Takes on the 8th of every month    Historical Provider, MD    Allergies Dhea [nutritional supplements]; Demerol  [meperidine]; Floxin [ofloxacin]; Nsaids; Nubain [nalbuphine hcl]; Phenergan [promethazine hcl]; Stadol [butorphanol]; and Erythromycin  Family History  Problem Relation Age of Onset  . Heart attack Mother   . Migraines Mother   . Aneurysm Mother   . Diabetes Father     Social History Social History  Substance Use Topics  . Smoking status: Current Every Day Smoker    Packs/day: 0.50    Years: 16.00    Types: Cigarettes  . Smokeless tobacco: Never Used  . Alcohol use No     Comment: occasionally    Review of Systems  Constitutional: Negative for fever. Cardiovascular: Positive for chest pain. Respiratory: Negative for shortness of breath. Gastrointestinal: Negative for abdominal pain, vomiting and diarrhea. Genitourinary: Negative for dysuria. Musculoskeletal: Negative for back pain. Skin: Negative for rash. Neurological: Negative for headaches, focal weakness or numbness.  10-point  ROS otherwise negative.  ____________________________________________   PHYSICAL EXAM:  VITAL SIGNS: ED Triage Vitals  Enc Vitals Group     BP 159/96     Pulse 85     Resp 22     Temp 98.1     Temp src      SpO2 98%   Constitutional: Alert and oriented. Well appearing and in no distress. Eyes: Conjunctivae are normal.  ENT   Head: Normocephalic and atraumatic.   Nose: No congestion/rhinnorhea.   Mouth/Throat: Mucous membranes are moist.   Neck: No stridor. Hematological/Lymphatic/Immunilogical: No cervical lymphadenopathy. Cardiovascular: Normal rate, regular rhythm.  No murmurs, rubs, or gallops.  Respiratory: Normal respiratory effort without tachypnea nor retractions. Breath sounds are clear and equal bilaterally. No wheezes/rales/rhonchi. Gastrointestinal: Soft and diffusely tender to palpation with guarding or rebound. Genitourinary: Deferred Musculoskeletal: Normal range of motion in all extremities. Bilateral 1+ edema. Neurologic:  Normal speech and  language. No gross focal neurologic deficits are appreciated.  Skin:  Skin is warm, dry and intact. No rash noted. Psychiatric: Mood and affect are normal. Speech and behavior are normal. Patient exhibits appropriate insight and judgment.  ____________________________________________    LABS (pertinent positives/negatives)  Labs Reviewed  TROPONIN I - Abnormal; Notable for the following:       Result Value   Troponin I 0.12 (*)    All other components within normal limits  COMPREHENSIVE METABOLIC PANEL - Abnormal; Notable for the following:    CO2 17 (*)    BUN 33 (*)    Creatinine, Ser 9.93 (*)    Calcium 8.0 (*)    Total Protein 5.7 (*)    Albumin 2.9 (*)    ALT 9 (*)    Alkaline Phosphatase 333 (*)    GFR calc non Af Amer 4 (*)    GFR calc Af Amer 5 (*)    All other components within normal limits  CBC WITH DIFFERENTIAL/PLATELET - Abnormal; Notable for the following:    RBC 2.85 (*)    Hemoglobin 8.9 (*)    HCT 27.0 (*)    RDW 18.5 (*)    Basophils Absolute 0.2 (*)    All other components within normal limits     ____________________________________________   EKG  I, Phineas Semen, attending physician, personally viewed and interpreted this EKG  EKG Time: 0104 Rate: 82 Rhythm: normal sinus rhythm Axis: normal Intervals: qtc 593 QRS: narrow, low voltage ST changes: no st elevation Impression: abnormal ekg   ____________________________________________    RADIOLOGY  CXR   IMPRESSION:  Mild cardiomegaly. Mild peribronchial thickening noted. Lungs  otherwise grossly clear.   ____________________________________________   PROCEDURES  Procedures CRITICAL CARE Performed by: Phineas Semen   Total critical care time: 25 minutes  Critical care time was exclusive of separately billable procedures and treating other patients.  Critical care was necessary to treat or prevent imminent or life-threatening deterioration.  Critical care was time  spent personally by me on the following activities: development of treatment plan with patient and/or surrogate as well as nursing, discussions with consultants, evaluation of patient's response to treatment, examination of patient, obtaining history from patient or surrogate, ordering and performing treatments and interventions, ordering and review of laboratory studies, ordering and review of radiographic studies, pulse oximetry and re-evaluation of patient's condition.  ____________________________________________   INITIAL IMPRESSION / ASSESSMENT AND PLAN / ED COURSE  Pertinent labs & imaging results that were available during my care of the patient were reviewed by me  and considered in my medical decision making (see chart for details).  Patient presented to the emergency department today because of concerns for chest pain. EKG did not show any concerning signs for ST elevation. Patient's troponin however was mildly elevated. Because of this patient will be given Plavix. Patient states she is unable to take aspirin because of swelling. Patient does have risk factors for ACS given history of dialysis. The patient's diaper did not have any blood in it per nursing staff. Patient's hemoglobin today better than it has been in the recent past. Given concern for ACS I do think the benefit outweighs the risk for Plavix however will hold on heparin drip at this time.  ____________________________________________   FINAL CLINICAL IMPRESSION(S) / ED DIAGNOSES  Chest pain Elevated troponin  Note: This dictation was prepared with Dragon dictation. Any transcriptional errors that result from this process are unintentional    Phineas Semen, MD 10/02/16 [phone]

## 2016-10-02 NOTE — Progress Notes (Signed)
Erin [name] is a 45 y.o. female  [id number]  Primary Cardiologist: Adrian Blackwater Reason for Consultation: Chest pain and elevated troponin  HPI: This is a 45 year old white female who is legally blind presented to the hospital with chest pain. Patient has a history of end-stage renal disease is on dialysis. Initial troponin was negative but the second troponin came back abnormal. Patient still has intermittent pressure type chest pain.   Review of Systems: No orthopnea PND or leg swelling   Past Medical History:  Diagnosis Date  . Anxiety   . Asthma   . Bipolar affect, depressed (HCC)   . Blind   . Chronic headaches    Seeing Pain Management  . Constipation   . Depression   . Epilepsy (HCC)   . Fibromyalgia   . History of CVA (cerebrovascular accident)   . History of DVT (deep vein thrombosis)   . Hyperlipidemia   . Hypothyroid   . Kidney failure    Currently on Dialysis  . Narcolepsy   . 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 Psychosis     Medications Prior to Admission  Medication Sig Dispense Refill  . estradiol (ESTRACE) 0.1 MG/GM vaginal cream Place 1 Applicatorful vaginally 2 (two) times a week. Mondays and Thursdays    . fenofibrate (TRICOR) 145 MG tablet Take 145 mg by mouth every other day.    . fludrocortisone (FLORINEF) 0.1 MG tablet Take 0.1 mg by mouth daily.    . furosemide (LASIX) 40 MG tablet Take 40 mg by mouth 2 (two) times daily.    Marland Kitchen gabapentin (NEURONTIN) 100 MG capsule Take 400 mg by mouth at bedtime.    . hydrocortisone (CORTEF) 10 MG tablet Take 5-10 mg by mouth 2 (two) times daily. Pt takes 10 mg in the morning and 5 mg at bedtime.    Marland Kitchen levothyroxine (SYNTHROID, LEVOTHROID) 112 MCG tablet Take 112 mcg by mouth daily.     Marland Kitchen loratadine (CLARITIN) 10 MG tablet Take 10 mg by mouth daily.    Marland Kitchen lovastatin (MEVACOR) 40 MG tablet Take 20 mg by mouth every evening.     . Melatonin 5 MG TABS Take 2 tablets  by mouth at bedtime.    . methylphenidate (RITALIN LA) 30 MG 24 hr capsule Take 30 mg by mouth every morning.    . pantoprazole (PROTONIX) 40 MG tablet Take 1 tablet (40 mg total) by mouth daily. 30 tablet 0  . risperidone (RISPERDAL) 4 MG tablet Take 2-4 mg by mouth 2 (two) times daily. Pt takes 4 mg in the morning and 2 mg at bedtime.    . sertraline (ZOLOFT) 50 MG tablet Take 50 mg by mouth daily.    . sevelamer carbonate (RENVELA) 800 MG tablet Take 2,400 mg by mouth 2 (two) times daily.     Marland Kitchen topiramate (TOPAMAX) 200 MG tablet Take 200 mg by mouth 2 (two) times daily. Pt takes with a 50 mg tablet.    . topiramate (TOPAMAX) 50 MG tablet Take 50 mg by mouth 2 (two) times daily. Pt takes with a 200 mg tablet.    . traZODone (DESYREL) 100 MG tablet Take 100 mg by mouth at bedtime.    . Vitamin D, Ergocalciferol, (DRISDOL) 50000 UNITS CAPS capsule Take 50,000 Units by mouth every 30 (thirty) days. Takes on the 8th of every month    . nystatin ointment (MYCOSTATIN) Apply 1 application topically 2 (two) times daily. Apply to affected area (  groin, abdominal fold). 30 g 0     . docusate sodium  100 mg Oral BID  . [START ON 10/03/2016] estradiol  1 Applicatorful Vaginal Once per day on Mon Thu  . fludrocortisone  0.1 mg Oral Daily  . furosemide  40 mg Oral BID  . gabapentin  400 mg Oral QHS  . heparin  5,000 Units Subcutaneous Q8H  . hydrocortisone  10 mg Oral q morning - 10a  . hydrocortisone  5 mg Oral QHS  . levothyroxine  112 mcg Oral QAC breakfast  . loratadine  10 mg Oral Daily  . Melatonin  2 tablet Oral QHS  . nystatin ointment  1 application Topical BID  . pantoprazole  40 mg Oral Daily  . pravastatin  20 mg Oral q1800  . risperiDONE  2 mg Oral QHS  . risperidone  4 mg Oral q morning - 10a  . sertraline  50 mg Oral Daily  . sevelamer carbonate  2,400 mg Oral BID  . sodium chloride flush  3 mL Intravenous Q12H  . topiramate  200 mg Oral BID  . topiramate  50 mg Oral BID  . [START  ON 10/07/2016] Vitamin D (Ergocalciferol)  50,000 Units Oral Q30 days    Infusions:   Allergies  Allergen Reactions  . Dhea [Nutritional Supplements] Anaphylaxis    Patient states medication is DHE for migraines, not DHEA  . Demerol [Meperidine] Other (See Comments)    Reaction:  Hallucinations   . Floxin [Ofloxacin] Other (See Comments)    Reaction:  Hallucinations   . Nsaids Nausea And Vomiting and Swelling  . Nubain [Nalbuphine Hcl] Other (See Comments)    Reaction:  Hallucinations   . Phenergan [Promethazine Hcl] Other (See Comments)    Reaction:  Restless legs   . Stadol [Butorphanol] Other (See Comments)    Reaction:  Hallucinations   . Erythromycin Diarrhea and Rash    Social History   Social History  . Marital status: Divorced    Spouse name: N/A  . Number of children: N/A  . Years of education: N/A   Occupational History  . Not on file.   Social History Main Topics  . Smoking status: Current Every Day Smoker    Packs/day: 0.50    Years: 16.00    Types: Cigarettes  . Smokeless tobacco: Never Used  . Alcohol use No     Comment: occasionally  . Drug use: No  . Sexual activity: Not on file   Other Topics Concern  . Not on file   Social History Narrative  . No narrative on file    Family History  Problem Relation Age of Onset  . Heart attack Mother   . Migraines Mother   . Aneurysm Mother   . Diabetes Father     PHYSICAL EXAM: Vitals:   10/02/16 0525 10/02/16 0813  BP: (!) 165/71 (!) 150/60  Pulse: 81 79  Resp: 18   Temp: 98 F (36.7 C)      Intake/Output Summary (Last 24 hours) at 10/02/16 1152 Last data filed at 10/02/16 0525  Gross per 24 hour  Intake                0 ml  Output                0 ml  Net                0 ml    General:  Well appearing. No  respiratory difficulty HEENT: normal Neck: supple. no JVD. Carotids 2+ bilat; no bruits. No lymphadenopathy or thryomegaly appreciated. Cor: PMI nondisplaced. Regular rate &  rhythm. No rubs, gallops or murmurs. Lungs: clear Abdomen: soft, nontender, nondistended. No hepatosplenomegaly. No bruits or masses. Good bowel sounds. Extremities: no cyanosis, clubbing, rash, edema Neuro: alert & oriented x 3, cranial nerves grossly intact. moves all 4 extremities w/o difficulty. Affect pleasant.  ZOX:WRUEA rhythm with nonspecific ST-T changes no acute changes  Results for orders placed or performed during the hospital encounter of 10/02/16 (from the past 24 hour(s))  Troponin I     Status: Abnormal   Collection Time: 10/02/16  1:55 AM  Result Value Ref Range   Troponin I 0.12 (HH) <0.03 ng/mL  Comprehensive metabolic panel     Status: Abnormal   Collection Time: 10/02/16  1:55 AM  Result Value Ref Range   Sodium 138 135 - 145 mmol/L   Potassium 4.4 3.5 - 5.1 mmol/L   Chloride 111 101 - 111 mmol/L   CO2 17 (L) 22 - 32 mmol/L   Glucose, Bld 78 65 - 99 mg/dL   BUN 33 (H) 6 - 20 mg/dL   Creatinine, Ser 5.40 (H) 0.44 - 1.00 mg/dL   Calcium 8.0 (L) 8.9 - 10.3 mg/dL   Total Protein 5.7 (L) 6.5 - 8.1 g/dL   Albumin 2.9 (L) 3.5 - 5.0 g/dL   AST 28 15 - 41 U/L   ALT 9 (L) 14 - 54 U/L   Alkaline Phosphatase 333 (H) 38 - 126 U/L   Total Bilirubin 1.2 0.3 - 1.2 mg/dL   GFR calc non Af Amer 4 (L) >60 mL/min   GFR calc Af Amer 5 (L) >60 mL/min   Anion gap 10 5 - 15  CBC with Differential/Platelet     Status: Abnormal   Collection Time: 10/02/16  1:55 AM  Result Value Ref Range   WBC 6.4 3.6 - 11.0 K/uL   RBC 2.85 (L) 3.80 - 5.20 MIL/uL   Hemoglobin 8.9 (L) 12.0 - 16.0 g/dL   HCT 98.1 (L) 19.1 - 47.8 %   MCV 94.6 80.0 - 100.0 fL   MCH 31.4 26.0 - 34.0 pg   MCHC 33.2 32.0 - 36.0 g/dL   RDW 29.5 (H) 62.1 - 30.8 %   Platelets 191 150 - 440 K/uL   Neutrophils Relative % 54 %   Neutro Abs 3.5 1.4 - 6.5 K/uL   Lymphocytes Relative 29 %   Lymphs Abs 1.8 1.0 - 3.6 K/uL   Monocytes Relative 8 %   Monocytes Absolute 0.5 0.2 - 0.9 K/uL   Eosinophils Relative 6 %    Eosinophils Absolute 0.4 0 - 0.7 K/uL   Basophils Relative 3 %   Basophils Absolute 0.2 (H) 0 - 0.1 K/uL  TSH     Status: Abnormal   Collection Time: 10/02/16  1:55 AM  Result Value Ref Range   TSH 0.111 (L) 0.350 - 4.500 uIU/mL  MRSA PCR Screening     Status: None   Collection Time: 10/02/16  6:33 AM  Result Value Ref Range   MRSA by PCR NEGATIVE NEGATIVE   Dg Chest 2 View  Result Date: 10/02/2016 CLINICAL DATA:  Acute onset of generalized chest pain and nausea. Initial encounter. EXAM: CHEST  2 VIEW COMPARISON:  Chest radiograph performed 09/17/2016 FINDINGS: The lungs are well-aerated. Mild peribronchial thickening is noted. There is no evidence of focal opacification, pleural effusion or pneumothorax. The heart is  mildly enlarged. No acute osseous abnormalities are seen. Clips are noted within the right upper quadrant, reflecting prior cholecystectomy. IMPRESSION: Mild cardiomegaly. Mild peribronchial thickening noted. Lungs otherwise grossly clear. Electronically Signed   By: Roanna Raider M.D.   On: 10/02/2016 02:20     ASSESSMENT AND PLAN:Acute coronary syndrome with mildly elevated troponin and multiple risk factors for coronary artery disease and multiple other medical problems including blindness. Risk and benefits of cardiac catheterization were explained to the patient and patient agreed to have the procedure.  KHAN,SHAUKAT A

## 2016-10-02 NOTE — Progress Notes (Signed)
Pre dialysis assessment 

## 2016-10-02 NOTE — Progress Notes (Addendum)
Patient back from dialysis. Drowsy, but arousable and answers questions appropriately. VSS. Pain improved per patient, now 3/10. Does not want to order dinner at this time, said she will let us know if she gets hungry. Pt wants to wait to take her 1800 meds in a little while, will reschedule in [facility].

## 2016-10-03 ENCOUNTER — Encounter
Admission: EM | Disposition: A | Discharge status: Home / Self Care | Payer: Self-pay | Source: Home / Self Care | Attending: Emergency Medicine

## 2016-10-03 DIAGNOSIS — R0789 Other chest pain: Secondary | ICD-10-CM | POA: Diagnosis not present

## 2016-10-03 HISTORY — PX: CARDIAC CATHETERIZATION: SHX172

## 2016-10-03 LAB — HEMOGLOBIN A1C
Hgb A1c MFr Bld: <4.2 % — ABNORMAL LOW (ref 4.8–5.6)
Mean Plasma Glucose: <74 mg/dL

## 2016-10-03 SURGERY — LEFT HEART CATH AND CORONARY ANGIOGRAPHY
Anesthesia: Moderate Sedation | Laterality: Right

## 2016-10-03 MED ORDER — HEPARIN (PORCINE) IN NACL 2-0.9 UNIT/ML-% IJ SOLN
INTRAMUSCULAR | Status: AC
  Filled 2016-10-03: qty 500

## 2016-10-03 MED ORDER — HYDROMORPHONE HCL 2 MG PO TABS
6.0000 mg | ORAL_TABLET | ORAL | Status: DC | PRN
  Administered 2016-10-03 – 2016-10-04 (×2): 6 mg via ORAL
  Filled 2016-10-03 (×2): qty 3

## 2016-10-03 MED ORDER — SODIUM CHLORIDE 0.9% FLUSH
3.0000 mL | Freq: Two times a day (BID) | INTRAVENOUS | Status: DC
  Administered 2016-10-03 – 2016-10-04 (×2): 3 mL via INTRAVENOUS

## 2016-10-03 MED ORDER — FENTANYL CITRATE (PF) 100 MCG/2ML IJ SOLN
INTRAMUSCULAR | Status: DC | PRN
  Administered 2016-10-03: 25 ug via INTRAVENOUS
  Administered 2016-10-03: 50 ug via INTRAVENOUS

## 2016-10-03 MED ORDER — MIDAZOLAM HCL 2 MG/2ML IJ SOLN
INTRAMUSCULAR | Status: DC | PRN
  Administered 2016-10-03: 1 mg via INTRAVENOUS
  Administered 2016-10-03: 0.5 mg via INTRAVENOUS

## 2016-10-03 MED ORDER — FENTANYL CITRATE (PF) 100 MCG/2ML IJ SOLN
INTRAMUSCULAR | Status: AC
  Filled 2016-10-03: qty 2

## 2016-10-03 MED ORDER — SODIUM CHLORIDE 0.9 % IV SOLN: 250.0000 mL | INTRAVENOUS | Status: DC | PRN

## 2016-10-03 MED ORDER — SODIUM CHLORIDE 0.9% FLUSH: 3.0000 mL | INTRAVENOUS | Status: DC | PRN

## 2016-10-03 MED ORDER — MIDAZOLAM HCL 2 MG/2ML IJ SOLN
INTRAMUSCULAR | Status: AC
  Filled 2016-10-03: qty 2

## 2016-10-03 MED ORDER — ONDANSETRON HCL 4 MG/2ML IJ SOLN: 4.0000 mg | Freq: Four times a day (QID) | INTRAMUSCULAR | Status: DC | PRN

## 2016-10-03 MED ORDER — SODIUM CHLORIDE 0.9% FLUSH: 3.0000 mL | Freq: Two times a day (BID) | INTRAVENOUS | Status: DC

## 2016-10-03 MED ORDER — ACETAMINOPHEN 325 MG PO TABS: 650.0000 mg | ORAL_TABLET | ORAL | Status: DC | PRN

## 2016-10-03 SURGICAL SUPPLY — 9 items
CATH 5FR JL4 DIAGNOSTIC (CATHETERS) ×3 IMPLANT
CATH 5FR PIGTAIL DIAGNOSTIC (CATHETERS) IMPLANT
CATH INFINITI JR4 5F (CATHETERS) ×3 IMPLANT
DEVICE CLOSURE MYNXGRIP 5F (Vascular Products) ×3 IMPLANT
KIT MANI 3VAL PERCEP (MISCELLANEOUS) ×3 IMPLANT
NEEDLE PERC 18GX7CM (NEEDLE) ×3 IMPLANT
PACK CARDIAC CATH (CUSTOM PROCEDURE TRAY) ×3 IMPLANT
SHEATH PINNACLE 5F 10CM (SHEATH) ×3 IMPLANT
WIRE EMERALD 3MM-J .035X150CM (WIRE) ×3 IMPLANT

## 2016-10-03 NOTE — Care Management (Signed)
Confirmed with [facility] that there is a referral present for this patient

## 2016-10-03 NOTE — Progress Notes (Signed)
[facility] - [place] at [facility]   PATIENT NAME: Erin [name]    MRN#:  [id number]  DATE OF BIRTH:  [birth year]  SUBJECTIVE:  Hospital Day: 0 days Erin [name] is a 45 y.o. female presenting with Chest Pain .   Overnight events: no overnight events Interval Events:No complaints no chest pain  REVIEW OF SYSTEMS:  CONSTITUTIONAL: No fever, fatigue or weakness.  EYES: No blurred or double vision.  EARS, NOSE, AND THROAT: No tinnitus or ear pain.  RESPIRATORY: No cough, shortness of breath, wheezing or hemoptysis.  CARDIOVASCULAR: No chest pain, orthopnea, edema.  GASTROINTESTINAL: No nausea, vomiting, diarrhea or abdominal pain.  GENITOURINARY: No dysuria, hematuria.  ENDOCRINE: No polyuria, nocturia,  HEMATOLOGY: No anemia, easy bruising or bleeding SKIN: No rash or lesion. MUSCULOSKELETAL: No joint pain or arthritis.   NEUROLOGIC: No tingling, numbness, weakness.  PSYCHIATRY: No anxiety or depression.   DRUG ALLERGIES:   Allergies  Allergen Reactions  . Dhea [Nutritional Supplements] Anaphylaxis    Patient states medication is DHE for migraines, not DHEA  . Demerol [Meperidine] Other (See Comments)    Reaction:  Hallucinations   . Floxin [Ofloxacin] Other (See Comments)    Reaction:  Hallucinations   . Nsaids Nausea And Vomiting and Swelling  . Nubain [Nalbuphine Hcl] Other (See Comments)    Reaction:  Hallucinations   . Phenergan [Promethazine Hcl] Other (See Comments)    Reaction:  Restless legs   . Stadol [Butorphanol] Other (See Comments)    Reaction:  Hallucinations   . Erythromycin Diarrhea and Rash    VITALS:  Blood pressure (!) 118/54, pulse (!) 56, temperature 97.8 F (36.6 C), temperature source Oral, resp. rate 16, height 4\' 10"  (1.473 m), weight 68.3 kg (150 lb 9.6 oz), last menstrual period 11/21/2007, SpO2 95 %.  PHYSICAL EXAMINATION:  VITAL SIGNS: Vitals:   10/03/16 0932 10/03/16 1151  BP: 138/67 (!) 118/54   Pulse: 60 (!) 56  Resp:  16  Temp: 97.7 F (36.5 C) 97.8 F (36.6 C)   GENERAL:45 y.o.female currently in no acute distress.  HEAD: Normocephalic, atraumatic.  EYES: Pupils equal, round, reactive to light.  No scleral icterus. Blind MOUTH: Moist mucosal membrane. Dentition intact. No abscess noted.  EAR, NOSE, THROAT: Clear without exudates. No external lesions.  NECK: Supple. No thyromegaly. No nodules. No JVD.  PULMONARY: Clear to ascultation, without wheeze rails or rhonci. No use of accessory muscles, Good respiratory effort. good air entry bilaterally CHEST: Nontender to palpation.  CARDIOVASCULAR: S1 and S2. Regular rate and rhythm. No murmurs, rubs, or gallops. No edema. Pedal pulses 2+ bilaterally.  GASTROINTESTINAL: Soft, nontender, nondistended. No masses. Positive bowel sounds. No hepatosplenomegaly.  MUSCULOSKELETAL: No swelling, clubbing, or edema. Range of motion full in all extremities.  NEUROLOGIC: Cranial nerves II through XII are intact. No gross focal neurological deficits. Sensation intact. Reflexes intact.  SKIN: No ulceration, lesions, rashes, or cyanosis. Skin warm and dry. Turgor intact.  PSYCHIATRIC: Mood, affect within normal limits. The patient is awake, alert and oriented x 3. Insight, judgment intact.      LABORATORY PANEL:   CBC  Recent Labs Lab 10/02/16 0155  WBC 6.4  HGB 8.9*  HCT 27.0*  PLT 191   ------------------------------------------------------------------------------------------------------------------  Chemistries   Recent Labs Lab 10/02/16 0155  NA 138  K 4.4  CL 111  CO2 17*  GLUCOSE 78  BUN 33*  CREATININE 9.93*  CALCIUM 8.0*  AST 28  ALT 9*  ALKPHOS 333*  BILITOT 1.2   ------------------------------------------------------------------------------------------------------------------  Cardiac Enzymes  Recent Labs Lab 10/02/16 1959  TROPONINI 0.09*    ------------------------------------------------------------------------------------------------------------------  RADIOLOGY:  Dg Chest 2 View  Result Date: 10/02/2016 CLINICAL DATA:  Acute onset of generalized chest pain and nausea. Initial encounter. EXAM: CHEST  2 VIEW COMPARISON:  Chest radiograph performed 09/17/2016 FINDINGS: The lungs are well-aerated. Mild peribronchial thickening is noted. There is no evidence of focal opacification, pleural effusion or pneumothorax. The heart is mildly enlarged. No acute osseous abnormalities are seen. Clips are noted within the right upper quadrant, reflecting prior cholecystectomy. IMPRESSION: Mild cardiomegaly. Mild peribronchial thickening noted. Lungs otherwise grossly clear. Electronically Signed   By: Roanna RaiderJeffery  Chang M.D.   On: 10/02/2016 02:20    EKG:   Orders placed or performed during the hospital encounter of 10/02/16  . ED EKG within 10 minutes  . ED EKG within 10 minutes    ASSESSMENT AND PLAN:   Erin [name] is a 45 y.o. female presenting with Chest Pain . Admitted 10/02/2016 : Day #: 0 days 1. Chest pain: cardiology plans for cath noted 2. ESRD on HD: THS, consulted 3. Panhypopit: home meds 4. hyperlipidemia unspecified: statin   All the records are reviewed and case discussed with Care Management/Social Workerr. Management plans discussed with the patient, family and they are in agreement.  CODE STATUS: full TOTAL TIME TAKING CARE OF THIS PATIENT: 28 minutes.   POSSIBLE D/C IN 1DAYS, DEPENDING ON CLINICAL CONDITION.   Ranie Chinchilla,  Mardi MainlandDavid K M.D on 10/03/2016 at 1:19 PM  Between 7am to 6pm - Pager - [phone]  After 6pm: House Pager: - [phone]  [facility]  Office  [phone]  CC: Primary care physician; [facility]

## 2016-10-03 NOTE — Care Management Obs Status (Signed)
MEDICARE OBSERVATION STATUS NOTIFICATION   Patient Details  Name: Erin [name] MRN: [id number] Date of Birth: 1971/08/24   Medicare Observation Status Notification Given:  Yes    Eber Hong, RN 10/03/2016, 8:41 AM

## 2016-10-03 NOTE — Progress Notes (Signed)
SUBJECTIVE: No further chest pain   Vitals:   10/02/16 1736 10/02/16 2059 10/03/16 0318 10/03/16 0500  BP: 134/72 (!) 145/77 (!) 151/73   Pulse: 90 94 78   Resp: 11 18 18    Temp: 97.4 F (36.3 C) 98.2 F (36.8 C) 98.6 F (37 C)   TempSrc: Oral Oral Oral   SpO2: 98% 99% 93%   Weight: 150 lb (68 kg)   150 lb 9.6 oz (68.3 kg)  Height:        Intake/Output Summary (Last 24 hours) at 10/03/16 0842 Last data filed at 10/03/16 0755  Gross per 24 hour  Intake              240 ml  Output              216 ml  Net               24 ml    LABS: Basic Metabolic Panel:  Recent Labs  58/59/29 0155  NA 138  K 4.4  CL 111  CO2 17*  GLUCOSE 78  BUN 33*  CREATININE 9.93*  CALCIUM 8.0*   Liver Function Tests:  Recent Labs  10/02/16 0155  AST 28  ALT 9*  ALKPHOS 333*  BILITOT 1.2  PROT 5.7*  ALBUMIN 2.9*   No results for input(s): LIPASE, AMYLASE in the last 72 hours. CBC:  Recent Labs  10/02/16 0155  WBC 6.4  NEUTROABS 3.5  HGB 8.9*  HCT 27.0*  MCV 94.6  PLT 191   Cardiac Enzymes:  Recent Labs  10/02/16 0155 10/02/16 1447 10/02/16 1959  TROPONINI 0.12* 0.11* 0.09*   BNP: Invalid input(s): POCBNP D-Dimer: No results for input(s): DDIMER in the last 72 hours. Hemoglobin A1C: No results for input(s): HGBA1C in the last 72 hours. Fasting Lipid Panel: No results for input(s): CHOL, HDL, LDLCALC, TRIG, CHOLHDL, LDLDIRECT in the last 72 hours. Thyroid Function Tests:  Recent Labs  10/02/16 0155  TSH 0.111*   Anemia Panel: No results for input(s): VITAMINB12, FOLATE, FERRITIN, TIBC, IRON, RETICCTPCT in the last 72 hours.   PHYSICAL EXAM General: Well developed, well nourished, in no acute distress HEENT:  Normocephalic and atramatic Neck:  No JVD.  Lungs: Clear bilaterally to auscultation and percussion. Heart: HRRR . Normal S1 and S2 without gallops or murmurs.  Abdomen: Bowel sounds are positive, abdomen soft and non-tender  Msk:  Back normal,  normal gait. Normal strength and tone for age. Extremities: No clubbing, cyanosis or edema.   Neuro: Alert and oriented X 3. Psych:  Good affect, responds appropriately  TELEMETRY:Sinus rhythm  ASSESSMENT AND PLAN: Acute coronary syndrome to have cardiac catheterization today.  Active Problems:   Chest pain    KHAN,SHAUKAT A, MD, [facility] 10/03/2016 8:42 AM

## 2016-10-03 NOTE — Progress Notes (Signed)
[facility]  ROUNDING NOTE   Subjective:   Hemodialysis yesterday. Tolerated treatment well.   Objective:  Vital signs in last 24 hours:  Temp:  [97.4 F (36.3 C)-98.6 F (37 C)] 97.8 F (36.6 C) (12/04 1151) Pulse Rate:  [56-97] 56 (12/04 1151) Resp:  [6-18] 16 (12/04 1151) BP: (74-151)/(44-84) 118/54 (12/04 1151) SpO2:  [93 %-99 %] 95 % (12/04 1151) Weight:  [68 kg (150 lb)-68.9 kg (151 lb 14.4 oz)] 68.3 kg (150 lb 9.6 oz) (12/04 0500)  Weight change: -5.1 kg (-11 lb 3.9 oz) Filed Weights   10/02/16 1435 10/02/16 1736 10/03/16 0500  Weight: 68.9 kg (151 lb 14.4 oz) 68 kg (150 lb) 68.3 kg (150 lb 9.6 oz)    Intake/Output: I/O last 3 completed shifts: In: 240 [P.O.:240] Out: 166 [Urine:51; Other:115]   Intake/Output this shift:  Total I/O In: -  Out: 50 [Urine:50]  Physical Exam: General: No acute distress  Head: Normocephalic, atraumatic. Moist oral mucosal membranes  Eyes: Anicteric  Neck: Supple, trachea midline  Lungs:  Clear to auscultation, normal effort  Heart: S1S2 no rubs  Abdomen:  Soft, nontender, bowel sounds present  Extremities: trace peripheral edema.  Neurologic: Nonfocal, moving all four extremities  Skin: No lesions  Access: RUE AVG    Basic Metabolic Panel:  Recent Labs Lab 10/02/16 0155  NA 138  K 4.4  CL 111  CO2 17*  GLUCOSE 78  BUN 33*  CREATININE 9.93*  CALCIUM 8.0*    Liver Function Tests:  Recent Labs Lab 10/02/16 0155  AST 28  ALT 9*  ALKPHOS 333*  BILITOT 1.2  PROT 5.7*  ALBUMIN 2.9*   No results for input(s): LIPASE, AMYLASE in the last 168 hours. No results for input(s): AMMONIA in the last 168 hours.  CBC:  Recent Labs Lab 10/02/16 0155  WBC 6.4  NEUTROABS 3.5  HGB 8.9*  HCT 27.0*  MCV 94.6  PLT 191    Cardiac Enzymes:  Recent Labs Lab 10/02/16 0155 10/02/16 1447 10/02/16 1959  TROPONINI 0.12* 0.11* 0.09*    BNP: Invalid input(s): POCBNP  CBG: No results for input(s):  GLUCAP in the last 168 hours.  Microbiology: Results for orders placed or performed during the hospital encounter of 10/02/16  MRSA PCR Screening     Status: None   Collection Time: 10/02/16  6:33 AM  Result Value Ref Range Status   MRSA by PCR NEGATIVE NEGATIVE Final    Comment:        The GeneXpert MRSA Assay (FDA approved for NASAL specimens only), is one component of a comprehensive MRSA colonization surveillance program. It is not intended to diagnose MRSA infection nor to guide or monitor treatment for MRSA infections.     Coagulation Studies: No results for input(s): LABPROT, INR in the last 72 hours.  Urinalysis: No results for input(s): COLORURINE, LABSPEC, PHURINE, GLUCOSEU, HGBUR, BILIRUBINUR, KETONESUR, PROTEINUR, UROBILINOGEN, NITRITE, LEUKOCYTESUR in the last 72 hours.  Invalid input(s): APPERANCEUR    Imaging: Dg Chest 2 View  Result Date: 10/02/2016 CLINICAL DATA:  Acute onset of generalized chest pain and nausea. Initial encounter. EXAM: CHEST  2 VIEW COMPARISON:  Chest radiograph performed 09/17/2016 FINDINGS: The lungs are well-aerated. Mild peribronchial thickening is noted. There is no evidence of focal opacification, pleural effusion or pneumothorax. The heart is mildly enlarged. No acute osseous abnormalities are seen. Clips are noted within the right upper quadrant, reflecting prior cholecystectomy. IMPRESSION: Mild cardiomegaly. Mild peribronchial thickening noted. Lungs otherwise grossly  clear. Electronically Signed   By: Roanna Raider M.D.   On: 10/02/2016 02:20     Medications:   . sodium chloride 1 mL/kg/hr (10/03/16 0950)   . docusate sodium  100 mg Oral BID  . [START ON 10/04/2016] epoetin (EPOGEN/PROCRIT) injection  10,000 Units Intravenous Q T,Th,Sa-HD  . estradiol  1 Applicatorful Vaginal Once per day on Mon Thu  . fludrocortisone  0.1 mg Oral Daily  . furosemide  40 mg Oral BID  . gabapentin  400 mg Oral QHS  . heparin  5,000 Units  Subcutaneous Q8H  . hydrocortisone  10 mg Oral q morning - 10a  . hydrocortisone  5 mg Oral QHS  . levothyroxine  112 mcg Oral QAC breakfast  . loratadine  10 mg Oral Daily  . Melatonin  2 tablet Oral QHS  . nystatin ointment  1 application Topical BID  . pantoprazole  40 mg Oral Daily  . pravastatin  20 mg Oral q1800  . risperiDONE  2 mg Oral QHS  . risperidone  4 mg Oral q morning - 10a  . sertraline  50 mg Oral Daily  . sevelamer carbonate  2,400 mg Oral BID  . sodium chloride flush  3 mL Intravenous Q12H  . sodium chloride flush  3 mL Intravenous Q12H  . topiramate  200 mg Oral BID  . topiramate  50 mg Oral BID  . [START ON 10/07/2016] Vitamin D (Ergocalciferol)  50,000 Units Oral Q30 days   sodium chloride, acetaminophen **OR** acetaminophen, HYDROmorphone, morphine injection, nitroGLYCERIN, ondansetron **OR** ondansetron (ZOFRAN) IV, polyethylene glycol, sodium chloride flush  Assessment/ Plan:  45 y.o. female with asthma, bipolar disorder, hyperlipidemia, hypothyroidism, panhypopituitarism, blindness, ESRD on HD, anemia of CKD, SHPTH, RUE AVG   [facility]/TTS-2 AVG  1. ESRD on HD TTS. Patient missed her regularly scheduled dialysis and got emergent hemodialysis yesterday.  - Resume TTS schedule.    2. Anemia of chronic kidney disease. Hemoglobin 8.9.  - Epogen 10,000 units IV with dialysis.  3. Secondary hyperparathyroidism: PTH 998 on 11/14. Not at goal.  -  sevelamer  - ergocalciferol  4. Hypotension: blood pressure at goal.  - hydrocortisone and fludrocortisone   LOS: 0 Sherlock Nancarrow 12/4/201712:52 PM

## 2016-10-03 NOTE — Progress Notes (Signed)
Pt clinically stable post heart cath, vitals stable, sb per monitor, right groin without bleeding nor hematoma, report called to care nurse DOnita RN with plan reviewed.

## 2016-10-03 NOTE — Care Management (Addendum)
Patient placed in observation for chest pain.  She is legally blind.  She has had 10 ED visits in the past six months between [facility] and [facility]. She is chronic esrd and receives  dialysis at [facility] in [place] [facility]. She uses medicaid transportation.  She says she is compliant with her treatment schedule.  Notified Susann Givens at [facility] that patient is currently under observation. Patient says that she now lives in an apartment. Says she was to have had home health start from [facility] health 12.3. CM will speak with agency

## 2016-10-04 ENCOUNTER — Encounter: Payer: Self-pay | Admitting: Cardiovascular Disease

## 2016-10-04 DIAGNOSIS — R0789 Other chest pain: Secondary | ICD-10-CM | POA: Diagnosis not present

## 2016-10-04 LAB — CBC
HCT: 23.4 % — ABNORMAL LOW (ref 35.0–47.0)
Hemoglobin: 7.6 g/dL — ABNORMAL LOW (ref 12.0–16.0)
MCH: 31.1 pg (ref 26.0–34.0)
MCHC: 32.6 g/dL (ref 32.0–36.0)
MCV: 95.3 fL (ref 80.0–100.0)
PLATELETS: 170 10*3/uL (ref 150–440)
RBC: 2.46 MIL/uL — AB (ref 3.80–5.20)
RDW: 20.7 % — AB (ref 11.5–14.5)
WBC: 5.7 10*3/uL (ref 3.6–11.0)

## 2016-10-04 LAB — RENAL FUNCTION PANEL
ALBUMIN: 2.9 g/dL — AB (ref 3.5–5.0)
Anion gap: 9 (ref 5–15)
BUN: 22 mg/dL — AB (ref 6–20)
CHLORIDE: 104 mmol/L (ref 101–111)
CO2: 24 mmol/L (ref 22–32)
CREATININE: 7.65 mg/dL — AB (ref 0.44–1.00)
Calcium: 7.3 mg/dL — ABNORMAL LOW (ref 8.9–10.3)
GFR, EST AFRICAN AMERICAN: 7 mL/min — AB (ref >60)
GFR, EST NON AFRICAN AMERICAN: 6 mL/min — AB (ref >60)
Glucose, Bld: 76 mg/dL (ref 65–99)
PHOSPHORUS: 6.1 mg/dL — AB (ref 2.5–4.6)
POTASSIUM: 4.4 mmol/L (ref 3.5–5.1)
Sodium: 137 mmol/L (ref 135–145)

## 2016-10-04 MED ORDER — HYDROMORPHONE HCL 4 MG PO TABS: 4.0000 mg | ORAL_TABLET | ORAL | 0 refills | Status: DC | PRN

## 2016-10-04 NOTE — Progress Notes (Signed)
SUBJECTIVE: Sleeping comfortable   Vitals:   10/03/16 1811 10/03/16 1944 10/04/16 0604 10/04/16 0745  BP: (!) 116/59 (!) 144/73 114/65 (!) 164/78  Pulse: (!) 51 64 73 71  Resp: 16 16 16 16   Temp: 98 F (36.7 C) 98.3 F (36.8 C) 98.2 F (36.8 C) 97.3 F (36.3 C)  TempSrc: Axillary   Oral  SpO2: 97% 98% 99% 98%  Weight:   152 lb 1.6 oz (69 kg)   Height:        Intake/Output Summary (Last 24 hours) at 10/04/16 1017 Last data filed at 10/03/16 1951  Gross per 24 hour  Intake                0 ml  Output                0 ml  Net                0 ml    LABS: Basic Metabolic Panel:  Recent Labs  75/30/05 0155  NA 138  K 4.4  CL 111  CO2 17*  GLUCOSE 78  BUN 33*  CREATININE 9.93*  CALCIUM 8.0*   Liver Function Tests:  Recent Labs  10/02/16 0155  AST 28  ALT 9*  ALKPHOS 333*  BILITOT 1.2  PROT 5.7*  ALBUMIN 2.9*   No results for input(s): LIPASE, AMYLASE in the last 72 hours. CBC:  Recent Labs  10/02/16 0155  WBC 6.4  NEUTROABS 3.5  HGB 8.9*  HCT 27.0*  MCV 94.6  PLT 191   Cardiac Enzymes:  Recent Labs  10/02/16 0155 10/02/16 1447 10/02/16 1959  TROPONINI 0.12* 0.11* 0.09*   BNP: Invalid input(s): POCBNP D-Dimer: No results for input(s): DDIMER in the last 72 hours. Hemoglobin A1C:  Recent Labs  10/02/16 0155  HGBA1C <4.2*   Fasting Lipid Panel: No results for input(s): CHOL, HDL, LDLCALC, TRIG, CHOLHDL, LDLDIRECT in the last 72 hours. Thyroid Function Tests:  Recent Labs  10/02/16 0155  TSH 0.111*   Anemia Panel: No results for input(s): VITAMINB12, FOLATE, FERRITIN, TIBC, IRON, RETICCTPCT in the last 72 hours.   PHYSICAL EXAM General: Well developed, well nourished, in no acute distress HEENT:  Normocephalic and atramatic Neck:  No JVD.  Lungs: Clear bilaterally to auscultation and percussion. Heart: HRRR . Normal S1 and S2 without gallops or murmurs.  Abdomen: Bowel sounds are positive, abdomen soft and non-tender   Msk:  Back normal, normal gait. Normal strength and tone for age. Extremities: No clubbing, cyanosis or edema.   Neuro: Alert and oriented X 3. Psych:  Good affect, responds appropriately  TELEMETRY: NSR  ASSESSMENT AND PLAN:Elevated troponins and chest pains. Patient had cardiac cath yesterday and had normal coronaries.  Active Problems:   Chest pain    Sarinah Doetsch A, MD, [facility] 10/04/2016 10:17 AM

## 2016-10-04 NOTE — Progress Notes (Signed)
Pre-Dialysis assessment. 

## 2016-10-04 NOTE — Progress Notes (Signed)
Pre dialysis  

## 2016-10-04 NOTE — Progress Notes (Signed)
Post HD assessment  

## 2016-10-04 NOTE — Progress Notes (Signed)
Discharge after HD

## 2016-10-04 NOTE — Progress Notes (Signed)
HD initiated without issue. Cbc/renal sent to lab per order. Pt currently sleeping. Vs stable. SB on monitor rate 50s.

## 2016-10-04 NOTE — Discharge Summary (Signed)
[employer] [facility] [place] [facility]   PATIENT NAME: Erin CharSarah [name]    MR#:  [id number]  DATE OF BIRTH:  03/01/1971  DATE OF ADMISSION:  10/02/2016 ADMITTING PHYSICIAN: Arnaldo NatalMichael S Diamond, MD  DATE OF DISCHARGE: 10/04/16  PRIMARY CARE PHYSICIAN: [facility]    ADMISSION DIAGNOSIS:  Elevated troponin [R74.8] Chest pain, unspecified type [R07.9]  DISCHARGE DIAGNOSIS:  Active Problems:   Chest pain ESRD on dialysis Chronic pain  SECONDARY DIAGNOSIS:   Past Medical History:  Diagnosis Date  . Anxiety   . Asthma   . Bipolar affect, depressed (HCC)   . Blind   . Chronic headaches    Seeing Pain Management  . Constipation   . Depression   . Epilepsy (HCC)   . Fibromyalgia   . History of CVA (cerebrovascular accident)   . History of DVT (deep vein thrombosis)   . Hyperlipidemia   . Hypothyroid   . Kidney failure    Currently on Dialysis  . Narcolepsy   . 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 Psychosis     HOSPITAL COURSE:  Erin [name]  is a 45 y.o. female admitted 10/02/2016 with chief complaint Chest Pain . Please see H&P performed by Arnaldo NatalMichael S Diamond, MD for further information. Patient presented with the above symptoms. Cardiology consult, underwent cardiac cath 10/03/16 - within normal limits. Patient states out of chronic pain meds for 3 weeks  DISCHARGE CONDITIONS:   stable  CONSULTS OBTAINED:  Treatment Team:  Mady HaagensenMunsoor Lateef, MD Laurier NancyShaukat A Khan, MD  DRUG ALLERGIES:   Allergies  Allergen Reactions  . Dhea [Nutritional Supplements] Anaphylaxis    Patient states medication is DHE for migraines, not DHEA  . Demerol [Meperidine] Other (See Comments)    Reaction:  Hallucinations   . Floxin [Ofloxacin] Other (See Comments)    Reaction:  Hallucinations   . Nsaids Nausea And Vomiting and Swelling  . Nubain [Nalbuphine Hcl] Other (See Comments)    Reaction:  Hallucinations   .  Phenergan [Promethazine Hcl] Other (See Comments)    Reaction:  Restless legs   . Stadol [Butorphanol] Other (See Comments)    Reaction:  Hallucinations   . Erythromycin Diarrhea and Rash    DISCHARGE MEDICATIONS:   Current Discharge Medication List    START taking these medications   Details  HYDROmorphone (DILAUDID) 4 MG tablet Take 1 tablet (4 mg total) by mouth every 4 (four) hours as needed for severe pain. Qty: 15 tablet, Refills: 0      CONTINUE these medications which have NOT CHANGED   Details  estradiol (ESTRACE) 0.1 MG/GM vaginal cream Place 1 Applicatorful vaginally 2 (two) times a week. Mondays and Thursdays    fenofibrate (TRICOR) 145 MG tablet Take 145 mg by mouth every other day.    fludrocortisone (FLORINEF) 0.1 MG tablet Take 0.1 mg by mouth daily.    furosemide (LASIX) 40 MG tablet Take 40 mg by mouth 2 (two) times daily.    gabapentin (NEURONTIN) 100 MG capsule Take 400 mg by mouth at bedtime.    hydrocortisone (CORTEF) 10 MG tablet Take 5-10 mg by mouth 2 (two) times daily. Pt takes 10 mg in the morning and 5 mg at bedtime.    levothyroxine (SYNTHROID, LEVOTHROID) 112 MCG tablet Take 112 mcg by mouth daily.     loratadine (CLARITIN) 10 MG tablet Take 10 mg by mouth daily.    lovastatin (MEVACOR) 40 MG tablet Take 20 mg by  mouth every evening.     Melatonin 5 MG TABS Take 2 tablets by mouth at bedtime.    methylphenidate (RITALIN LA) 30 MG 24 hr capsule Take 30 mg by mouth every morning.    pantoprazole (PROTONIX) 40 MG tablet Take 1 tablet (40 mg total) by mouth daily. Qty: 30 tablet, Refills: 0    risperidone (RISPERDAL) 4 MG tablet Take 2-4 mg by mouth 2 (two) times daily. Pt takes 4 mg in the morning and 2 mg at bedtime.    sertraline (ZOLOFT) 50 MG tablet Take 50 mg by mouth daily.    sevelamer carbonate (RENVELA) 800 MG tablet Take 2,400 mg by mouth 2 (two) times daily.     !! topiramate (TOPAMAX) 200 MG tablet Take 200 mg by mouth 2 (two)  times daily. Pt takes with a 50 mg tablet.    !! topiramate (TOPAMAX) 50 MG tablet Take 50 mg by mouth 2 (two) times daily. Pt takes with a 200 mg tablet.    traZODone (DESYREL) 100 MG tablet Take 100 mg by mouth at bedtime.    Vitamin D, Ergocalciferol, (DRISDOL) 50000 UNITS CAPS capsule Take 50,000 Units by mouth every 30 (thirty) days. Takes on the 8th of every month    nystatin ointment (MYCOSTATIN) Apply 1 application topically 2 (two) times daily. Apply to affected area (groin, abdominal fold). Qty: 30 g, Refills: 0     !! - Potential duplicate medications found. Please discuss with provider.       DISCHARGE INSTRUCTIONS:    DIET:  Renal diet  DISCHARGE CONDITION:  Stable  ACTIVITY:  Activity as tolerated  OXYGEN:  Home Oxygen: No.   Oxygen Delivery: room air  DISCHARGE LOCATION:  home   If you experience worsening of your admission symptoms, develop shortness of breath, life threatening emergency, suicidal or homicidal thoughts you must seek medical attention immediately by calling 911 or calling your MD immediately  if symptoms less severe.  You Must read complete instructions/literature along with all the possible adverse reactions/side effects for all the Medicines you take and that have been prescribed to you. Take any new Medicines after you have completely understood and accpet all the possible adverse reactions/side effects.   Please note  You were cared for by a hospitalist during your hospital stay. If you have any questions about your discharge medications or the care you received while you were in the hospital after you are discharged, you can call the unit and asked to speak with the hospitalist on call if the hospitalist that took care of you is not available. Once you are discharged, your primary care physician will handle any further medical issues. Please note that NO REFILLS for any discharge medications will be authorized once you are discharged, as  it is imperative that you return to your primary care physician (or establish a relationship with a primary care physician if you do not have one) for your aftercare needs so that they can reassess your need for medications and monitor your lab values.    On the day of Discharge:   VITAL SIGNS:  Blood pressure (!) 119/33, pulse 60, temperature 97.7 F (36.5 C), temperature source Oral, resp. rate 10, height 4\' 10"  (1.473 m), weight 68.9 kg (151 lb 14.4 oz), last menstrual period 11/21/2007, SpO2 100 %.  I/O:   Intake/Output Summary (Last 24 hours) at 10/04/16 1040 Last data filed at 10/03/16 1951  Gross per 24 hour  Intake  0 ml  Output                0 ml  Net                0 ml    PHYSICAL EXAMINATION:  GENERAL:  45 y.o.-year-old patient lying in the bed with no acute distress.  EYES: Pupils equal, round, reactive to light and accommodation. No scleral icterus. Extraocular muscles intact.  HEENT: Head atraumatic, normocephalic. Oropharynx and nasopharynx clear.  NECK:  Supple, no jugular venous distention. No thyroid enlargement, no tenderness.  LUNGS: Normal breath sounds bilaterally, no wheezing, rales,rhonchi or crepitation. No use of accessory muscles of respiration.  CARDIOVASCULAR: S1, S2 normal. No murmurs, rubs, or gallops.  ABDOMEN: Soft, non-tender, non-distended. Bowel sounds present. No organomegaly or mass.  EXTREMITIES: No pedal edema, cyanosis, or clubbing.  NEUROLOGIC: Cranial nerves II through XII are intact. Muscle strength 5/5 in all extremities. Sensation intact. Gait not checked.  PSYCHIATRIC: The patient is alert and oriented x 3.  SKIN: No obvious rash, lesion, or ulcer.   DATA REVIEW:   CBC  Recent Labs Lab 10/02/16 0155  WBC 6.4  HGB 8.9*  HCT 27.0*  PLT 191    Chemistries   Recent Labs Lab 10/02/16 0155  NA 138  K 4.4  CL 111  CO2 17*  GLUCOSE 78  BUN 33*  CREATININE 9.93*  CALCIUM 8.0*  AST 28  ALT 9*  ALKPHOS  333*  BILITOT 1.2    Cardiac Enzymes  Recent Labs Lab 10/02/16 1959  TROPONINI 0.09*    Microbiology Results  Results for orders placed or performed during the hospital encounter of 10/02/16  MRSA PCR Screening     Status: None   Collection Time: 10/02/16  6:33 AM  Result Value Ref Range Status   MRSA by PCR NEGATIVE NEGATIVE Final    Comment:        The GeneXpert MRSA Assay (FDA approved for NASAL specimens only), is one component of a comprehensive MRSA colonization surveillance program. It is not intended to diagnose MRSA infection nor to guide or monitor treatment for MRSA infections.     RADIOLOGY:  No results found.   Management plans discussed with the patient, family and they are in agreement.  CODE STATUS:     Code Status Orders        Start     Ordered   10/02/16 0510  Full code  Continuous     10/02/16 0509    Code Status History    Date Active Date Inactive Code Status Order ID Comments User Context   06/27/2016  8:27 PM 06/30/2016  5:15 PM Full Code [id number]  Delano Metz, MD Inpatient   03/04/2016  6:04 AM 03/07/2016  6:24 PM Full Code [id number]  Oralia Manis, MD Inpatient   02/25/2016  3:36 PM 03/01/2016  3:06 PM Full Code [id number]  Enedina Finner, MD Inpatient      TOTAL TIME TAKING CARE OF THIS PATIENT: 33 minutes.    Lamoine Fredricksen,  Mardi Mainland.D on 10/04/2016 at 10:40 AM  Between 7am to 6pm - Pager - [phone]  After 6pm go to [email or web address] - Therapist, nutritional Hospitalists  Office  [phone]  CC: Primary care physician; [facility]

## 2016-10-04 NOTE — Care Management (Addendum)
spoke with Mellody Dance from [facility].  Patient's mental health health case worker is Teaching laboratory technician [phone].  Agency will transport patient home today.  Referral for home health SN Aide and SW faxed to [facility] . Faxed DC Summary to Susann Givens with [facility]

## 2016-10-04 NOTE — Progress Notes (Signed)
HD completed. Tolerated well. 2L UF

## 2016-10-04 NOTE — Progress Notes (Signed)
[facility]  ROUNDING NOTE   Subjective:   Seen and examined on hemodialysis. Tolerating treatment well. UF goal of 2 litres.     HEMODIALYSIS FLOWSHEET:  Blood Flow Rate (mL/min): 400 mL/min Arterial Pressure (mmHg): -190 mmHg Venous Pressure (mmHg): 170 mmHg Transmembrane Pressure (mmHg): 50 mmHg Ultrafiltration Rate (mL/min): 710 mL/min Dialysate Flow Rate (mL/min): 800 ml/min Conductivity: Machine : 14 Conductivity: Machine : 14 Dialysis Fluid Bolus: Normal Saline Bolus Amount (mL): 250 mL Dialysate Change: 2K Intra-Hemodialysis Comments: 914. sleeping    Objective:  Vital signs in last 24 hours:  Temp:  [97.3 F (36.3 C)-98.3 F (36.8 C)] 97.7 F (36.5 C) (12/05 1029) Pulse Rate:  [48-75] 75 (12/05 1157) Resp:  [10-16] 13 (12/05 1157) BP: (105-164)/(33-82) 110/61 (12/05 1157) SpO2:  [95 %-100 %] 95 % (12/05 1157) Weight:  [68 kg (150 lb)-69 kg (152 lb 1.6 oz)] 68.9 kg (151 lb 14.4 oz) (12/05 1029)  Weight change: -0.86 kg (-1 lb 14.4 oz) Filed Weights   10/03/16 1441 10/04/16 0604 10/04/16 1029  Weight: 68 kg (150 lb) 69 kg (152 lb 1.6 oz) 68.9 kg (151 lb 14.4 oz)    Intake/Output: I/O last 3 completed shifts: In: 240 [P.O.:240] Out: 100 [Urine:100]   Intake/Output this shift:  No intake/output data recorded.  Physical Exam: General: No acute distress  Head: Normocephalic, atraumatic. Moist oral mucosal membranes  Eyes: Anicteric  Neck: Supple, trachea midline  Lungs:  Clear to auscultation, normal effort  Heart: S1S2 no rubs  Abdomen:  Soft, nontender, bowel sounds present  Extremities: No peripheral edema.  Neurologic: Nonfocal, moving all four extremities  Skin: No lesions  Access: RUE AVG    Basic Metabolic Panel:  Recent Labs Lab 10/02/16 0155 10/04/16 1040  NA 138 137  K 4.4 4.4  CL 111 104  CO2 17* 24  GLUCOSE 78 76  BUN 33* 22*  CREATININE 9.93* 7.65*  CALCIUM 8.0* 7.3*  PHOS  --  6.1*    Liver Function  Tests:  Recent Labs Lab 10/02/16 0155 10/04/16 1040  AST 28  --   ALT 9*  --   ALKPHOS 333*  --   BILITOT 1.2  --   PROT 5.7*  --   ALBUMIN 2.9* 2.9*   No results for input(s): LIPASE, AMYLASE in the last 168 hours. No results for input(s): AMMONIA in the last 168 hours.  CBC:  Recent Labs Lab 10/02/16 0155 10/04/16 1040  WBC 6.4 5.7  NEUTROABS 3.5  --   HGB 8.9* 7.6*  HCT 27.0* 23.4*  MCV 94.6 95.3  PLT 191 170    Cardiac Enzymes:  Recent Labs Lab 10/02/16 0155 10/02/16 1447 10/02/16 1959  TROPONINI 0.12* 0.11* 0.09*    BNP: Invalid input(s): POCBNP  CBG: No results for input(s): GLUCAP in the last 168 hours.  Microbiology: Results for orders placed or performed during the hospital encounter of 10/02/16  MRSA PCR Screening     Status: None   Collection Time: 10/02/16  6:33 AM  Result Value Ref Range Status   MRSA by PCR NEGATIVE NEGATIVE Final    Comment:        The GeneXpert MRSA Assay (FDA approved for NASAL specimens only), is one component of a comprehensive MRSA colonization surveillance program. It is not intended to diagnose MRSA infection nor to guide or monitor treatment for MRSA infections.     Coagulation Studies: No results for input(s): LABPROT, INR in the last 72 hours.  Urinalysis: No  results for input(s): COLORURINE, LABSPEC, PHURINE, GLUCOSEU, HGBUR, BILIRUBINUR, KETONESUR, PROTEINUR, UROBILINOGEN, NITRITE, LEUKOCYTESUR in the last 72 hours.  Invalid input(s): APPERANCEUR    Imaging: No results found.   Medications:    . docusate sodium  100 mg Oral BID  . epoetin (EPOGEN/PROCRIT) injection  10,000 Units Intravenous Q T,Th,Sa-HD  . estradiol  1 Applicatorful Vaginal Once per day on Mon Thu  . fludrocortisone  0.1 mg Oral Daily  . furosemide  40 mg Oral BID  . gabapentin  400 mg Oral QHS  . heparin  5,000 Units Subcutaneous Q8H  . hydrocortisone  10 mg Oral q morning - 10a  . hydrocortisone  5 mg Oral QHS  .  levothyroxine  112 mcg Oral QAC breakfast  . loratadine  10 mg Oral Daily  . Melatonin  2 tablet Oral QHS  . nystatin ointment  1 application Topical BID  . pantoprazole  40 mg Oral Daily  . pravastatin  20 mg Oral q1800  . risperiDONE  2 mg Oral QHS  . risperidone  4 mg Oral q morning - 10a  . sertraline  50 mg Oral Daily  . sevelamer carbonate  2,400 mg Oral BID  . sodium chloride flush  3 mL Intravenous Q12H  . sodium chloride flush  3 mL Intravenous Q12H  . sodium chloride flush  3 mL Intravenous Q12H  . topiramate  200 mg Oral BID  . topiramate  50 mg Oral BID  . [START ON 10/07/2016] Vitamin D (Ergocalciferol)  50,000 Units Oral Q30 days   sodium chloride, sodium chloride, acetaminophen **OR** acetaminophen, HYDROmorphone, morphine injection, nitroGLYCERIN, ondansetron **OR** ondansetron (ZOFRAN) IV, polyethylene glycol, sodium chloride flush, sodium chloride flush  Assessment/ Plan:  45 y.o.white female with asthma, bipolar disorder, hyperlipidemia, hypothyroidism, panhypopituitarism, blindness, ESRD on HD, anemia of CKD, SHPTH, RUE AVG   [facility]/TTS-2 AVG  1. ESRD on HD TTS. Seen and examined on hemodialysis. Tolerating treatment well.   2. Anemia of chronic kidney disease.   - Epogen 10,000 units IV with dialysis.  3. Secondary hyperparathyroidism: PTH 998 on 11/14. Not at goal.  -  sevelamer  - ergocalciferol  4. Hypotension: blood pressure at goal.  - hydrocortisone and fludrocortisone   LOS: 0 Glendine Swetz 12/5/201712:15 PM

## 2016-10-04 NOTE — Progress Notes (Signed)
Patient discharged per MD order and hospital protocol. Patient was given her home key, medications, and patient belongings. Patient verbalized understanding of discharge instructions, medications and follow up appointments. Patient was educated on post cath site.Patient was escorted with the volunteer and Quinton from [employer].

## 2016-10-07 ENCOUNTER — Emergency Department
Admission: EM | Admit: 2016-10-07 | Discharge: 2016-10-08 | Disposition: A | Discharge status: Home / Self Care | Payer: Medicare Other | Attending: Emergency Medicine | Admitting: Emergency Medicine

## 2016-10-07 ENCOUNTER — Emergency Department: Payer: Medicare Other

## 2016-10-07 ENCOUNTER — Encounter: Payer: Self-pay | Admitting: Emergency Medicine

## 2016-10-07 DIAGNOSIS — J45909 Unspecified asthma, uncomplicated: Secondary | ICD-10-CM | POA: Insufficient documentation

## 2016-10-07 DIAGNOSIS — Y939 Activity, unspecified: Secondary | ICD-10-CM | POA: Diagnosis not present

## 2016-10-07 DIAGNOSIS — S8991XA Unspecified injury of right lower leg, initial encounter: Secondary | ICD-10-CM | POA: Diagnosis present

## 2016-10-07 DIAGNOSIS — Y929 Unspecified place or not applicable: Secondary | ICD-10-CM | POA: Insufficient documentation

## 2016-10-07 DIAGNOSIS — S8011XA Contusion of right lower leg, initial encounter: Secondary | ICD-10-CM | POA: Diagnosis not present

## 2016-10-07 DIAGNOSIS — N186 End stage renal disease: Secondary | ICD-10-CM | POA: Insufficient documentation

## 2016-10-07 DIAGNOSIS — X58XXXA Exposure to other specified factors, initial encounter: Secondary | ICD-10-CM | POA: Insufficient documentation

## 2016-10-07 DIAGNOSIS — M79604 Pain in right leg: Secondary | ICD-10-CM

## 2016-10-07 DIAGNOSIS — Y999 Unspecified external cause status: Secondary | ICD-10-CM | POA: Diagnosis not present

## 2016-10-07 DIAGNOSIS — E039 Hypothyroidism, unspecified: Secondary | ICD-10-CM | POA: Diagnosis not present

## 2016-10-07 DIAGNOSIS — F17210 Nicotine dependence, cigarettes, uncomplicated: Secondary | ICD-10-CM | POA: Diagnosis not present

## 2016-10-07 DIAGNOSIS — Z992 Dependence on renal dialysis: Secondary | ICD-10-CM | POA: Insufficient documentation

## 2016-10-07 DIAGNOSIS — Z79899 Other long term (current) drug therapy: Secondary | ICD-10-CM | POA: Insufficient documentation

## 2016-10-07 NOTE — ED Triage Notes (Signed)
Patient from home via [facility]. Reporting sudden onset pain to right groin after ambulation. Patient had cardiac cath done 10/03/16 by Dr. Park Breed. States no pain prior to tonight. Denies fever, nausea and vomiting.

## 2016-10-07 NOTE — ED Provider Notes (Signed)
[facility] Emergency Department Provider Note        Time seen: ----------------------------------------- 10:16 PM on 10/07/2016 -----------------------------------------    I have reviewed the triage vital signs and the nursing notes.   HISTORY  Chief Complaint Leg Pain    HPI Erin [name] is a 45 y.o. female who presents to ER with right groin pain after ambulation. Patient poorly had a cardiac catheter done 4 days ago performed here but has not had need unusual pain until tonight while she was walking. She denies fevers, vomiting or diarrhea. Pain is 10 out of 10 in the right leg.   Past Medical History:  Diagnosis Date  . Anxiety   . Asthma   . Bipolar affect, depressed (HCC)   . Blind   . Chronic headaches    Seeing Pain Management  . Constipation   . Depression   . Epilepsy (HCC)   . Fibromyalgia   . History of CVA (cerebrovascular accident)   . History of DVT (deep vein thrombosis)   . Hyperlipidemia   . Hypothyroid   . Kidney failure    Currently on Dialysis  . Narcolepsy   . 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 Psychosis     Patient Active Problem List   Diagnosis Date Noted  . Chest pain 10/02/2016  . Kidney failure   . Palliative care encounter   . Goals of care, counseling/discussion   . Hypoglycemia 06/27/2016  . Seizure disorder (HCC) 06/27/2016  . Blindness 06/27/2016  . Chronic pain 06/27/2016  . History of CVA (cerebrovascular accident) 06/27/2016  . Generalized weakness 03/04/2016  . Hypothyroidism 03/04/2016  . GERD (gastroesophageal reflux disease) 03/04/2016  . End stage renal disease on dialysis (HCC) 03/04/2016  . Bipolar 1 disorder, mixed, moderate (HCC) 02/26/2016  . 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 Anemia 02/25/2016    Past Surgical History:  Procedure Laterality Date  . BRAIN  SURGERY  1976  . BREAST LUMPECTOMY  2001  . CARDIAC CATHETERIZATION Right 10/03/2016   Procedure: Coronary Angiography;  Surgeon: Laurier Nancy, MD;  Location: [facility] INVASIVE CV LAB;  Service: Cardiovascular;  Laterality: Right;  . CHOLECYSTECTOMY  2001  . DG AV DIALYSIS GRAFT DECLOT OR     X 4  . GIVENS CAPSULE STUDY N/A 02/29/2016   Procedure: GIVENS CAPSULE STUDY;  Surgeon: Elnita Maxwell, MD;  Location: [facility] ENDOSCOPY;  Service: Endoscopy;  Laterality: N/A;  . PERIPHERAL VASCULAR CATHETERIZATION N/A 08/21/2015   Procedure: Dialysis/Perma Catheter Insertion;  Surgeon: Renford Dills, MD;  Location: [facility] INVASIVE CV LAB;  Service: Cardiovascular;  Laterality: N/A;  . PERIPHERAL VASCULAR CATHETERIZATION N/A 02/29/2016   Procedure: Dialysis/Perma Catheter Removal;  Surgeon: Annice Needy, MD;  Location: [facility] INVASIVE CV LAB;  Service: Cardiovascular;  Laterality: N/A;  . PITUITARY EXCISION  1976    Allergies Dhea [nutritional supplements]; Demerol [meperidine]; Floxin [ofloxacin]; Nsaids; Nubain [nalbuphine hcl]; Phenergan [promethazine hcl]; Stadol [butorphanol]; and Erythromycin  Social History Social History  Substance Use Topics  . Smoking status: Current Every Day Smoker    Packs/day: 0.50    Years: 16.00    Types: Cigarettes  . Smokeless tobacco: Never Used  . Alcohol use No     Comment: occasionally    Review of Systems Constitutional: Negative for fever. Cardiovascular: Negative for chest pain. Respiratory: Negative for shortness of breath. Gastrointestinal: Negative for abdominal pain, vomiting and diarrhea. Musculoskeletal: Positive for  right leg pain Skin: Negative for rash. Neurological: Negative for headaches, focal weakness or numbness.  10-point ROS otherwise negative.  ____________________________________________   PHYSICAL EXAM:  VITAL SIGNS: ED Triage Vitals  Enc Vitals Group     BP 10/07/16 2159 136/74     Pulse Rate 10/07/16 2159 87     Resp  10/07/16 2159 16     Temp 10/07/16 2159 98.3 F (36.8 C)     Temp src --      SpO2 10/07/16 2159 100 %     Weight 10/07/16 2200 151 lb (68.5 kg)     Height 10/07/16 2200 4\' 10"  (1.473 m)     Head Circumference --      Peak Flow --      Pain Score 10/07/16 2200 10     Pain Loc --      Pain Edu? --      Excl. in GC? --     Constitutional: Alert and oriented. Well appearing and in no distress. ENT   Head: Normocephalic and atraumatic.   Nose: No congestion/rhinnorhea.   Mouth/Throat: Mucous membranes are moist.   Neck: No stridor. Cardiovascular: Normal rate, regular rhythm. No murmurs, rubs, or gallops. Respiratory: Normal respiratory effort without tachypnea nor retractions. Breath sounds are clear and equal bilaterally. No wheezes/rales/rhonchi. Gastrointestinal: Soft and nontender. Normal bowel sounds Musculoskeletal: Right groin tenderness, small hematomas palpated Neurologic:  Normal speech and language. No gross focal neurologic deficits are appreciated.  Skin:  Skin is warm, dry and intact. No rash noted. Normal post operative parents to the right groin Psychiatric: Mood and affect are normal. Speech and behavior are normal.  ___________________________________________  ED COURSE:  Pertinent labs & imaging results that were available during my care of the patient were reviewed by me and considered in my medical decision making (see chart for details). Clinical Course   Patient is in no distress, we will assess with right lower extremity ultrasound.  Procedures ____________________________________________   RADIOLOGY  Right lower extremity ultrasound Is pending ____________________________________________  FINAL ASSESSMENT AND PLAN  Leg pain, hematoma  Plan: Patient's ultrasound is pending at this time, is likely appropriate postoperative pain. Patient care checked out to Dr. Cyril LoosenKinner for final disposition.   Emily FilbertWilliams, Del Wiseman E, MD   Note: This  dictation was prepared with Dragon dictation. Any transcriptional errors that result from this process are unintentional    Emily FilbertJonathan E Treylon Henard, MD 10/07/16 2322

## 2016-10-07 NOTE — ED Notes (Signed)
Patient transported to Ultrasound 

## 2016-10-07 NOTE — ED Notes (Signed)
Blankets and pillow provided. Call bell in left hand. Report from laura, rn.

## 2016-10-08 NOTE — ED Notes (Signed)
Pt to wait in room until ride available.

## 2016-10-08 NOTE — ED Provider Notes (Addendum)
Asked to follow up on ultrasound report by Dr. Mayford Knife and if normal appropriate for discharge. Ultrasound is unremarkable. Patient complained of brief episode of chest tightness while lying in bed which resolved almost immediately, nurse did an EKG which is unremarkable. Patient had catheterization earlier this week which showed normal coronaries do not feel further workup is necessary at this time as patient feels well.  ED ECG REPORT I, Jene Every, the attending physician, personally viewed and interpreted this ECG.  Date: 10/08/2016 EKG Time: 12:22 AM Rate: 70 Rhythm: normal sinus rhythm QRS Axis: normal Intervals: normal ST/T Wave abnormalities: normal Conduction Disturbances: Incomplete right bundle Narrative Interpretation: Unchanged from prior    Jene Every, MD 10/08/16 9728    Jene Every, MD 10/08/16 [phone]

## 2016-10-08 NOTE — ED Notes (Signed)
md in to reassess. 

## 2016-10-08 NOTE — ED Notes (Signed)
Pt states "my chest is really hurting now, like a 9." pt denies other complaints. md notified, order for ekg obtained.

## 2016-10-08 NOTE — Discharge Instructions (Signed)
Please follow up with your doctor this week. Return to the ED if you develop worsening symptoms

## 2016-10-08 NOTE — ED Notes (Signed)
Pt returned from ultrasound, call bell at side.

## 2016-10-15 ENCOUNTER — Emergency Department
Admission: EM | Admit: 2016-10-15 | Discharge: 2016-10-15 | Disposition: A | Discharge status: Home / Self Care | Payer: Medicare Other | Attending: Emergency Medicine | Admitting: Emergency Medicine

## 2016-10-15 ENCOUNTER — Emergency Department: Payer: Medicare Other

## 2016-10-15 DIAGNOSIS — Z79899 Other long term (current) drug therapy: Secondary | ICD-10-CM | POA: Insufficient documentation

## 2016-10-15 DIAGNOSIS — K859 Acute pancreatitis without necrosis or infection, unspecified: Secondary | ICD-10-CM | POA: Diagnosis not present

## 2016-10-15 DIAGNOSIS — F17210 Nicotine dependence, cigarettes, uncomplicated: Secondary | ICD-10-CM | POA: Insufficient documentation

## 2016-10-15 DIAGNOSIS — Z992 Dependence on renal dialysis: Secondary | ICD-10-CM | POA: Insufficient documentation

## 2016-10-15 DIAGNOSIS — I120 Hypertensive chronic kidney disease with stage 5 chronic kidney disease or end stage renal disease: Secondary | ICD-10-CM | POA: Insufficient documentation

## 2016-10-15 DIAGNOSIS — N186 End stage renal disease: Secondary | ICD-10-CM | POA: Insufficient documentation

## 2016-10-15 DIAGNOSIS — J45909 Unspecified asthma, uncomplicated: Secondary | ICD-10-CM | POA: Diagnosis not present

## 2016-10-15 DIAGNOSIS — E039 Hypothyroidism, unspecified: Secondary | ICD-10-CM | POA: Diagnosis not present

## 2016-10-15 DIAGNOSIS — R079 Chest pain, unspecified: Secondary | ICD-10-CM | POA: Diagnosis present

## 2016-10-15 LAB — TROPONIN I

## 2016-10-15 LAB — COMPREHENSIVE METABOLIC PANEL
ALT: 10 U/L — ABNORMAL LOW (ref 14–54)
ANION GAP: 9 (ref 5–15)
AST: 32 U/L (ref 15–41)
Albumin: 3.2 g/dL — ABNORMAL LOW (ref 3.5–5.0)
Alkaline Phosphatase: 180 U/L — ABNORMAL HIGH (ref 38–126)
BILIRUBIN TOTAL: 0.7 mg/dL (ref 0.3–1.2)
BUN: 33 mg/dL — AB (ref 6–20)
CHLORIDE: 104 mmol/L (ref 101–111)
CO2: 23 mmol/L (ref 22–32)
Calcium: 7.1 mg/dL — ABNORMAL LOW (ref 8.9–10.3)
Creatinine, Ser: 7 mg/dL — ABNORMAL HIGH (ref 0.44–1.00)
GFR, EST AFRICAN AMERICAN: 7 mL/min — AB (ref >60)
GFR, EST NON AFRICAN AMERICAN: 6 mL/min — AB (ref >60)
Glucose, Bld: 79 mg/dL (ref 65–99)
POTASSIUM: 4.1 mmol/L (ref 3.5–5.1)
Sodium: 136 mmol/L (ref 135–145)
TOTAL PROTEIN: 5.9 g/dL — AB (ref 6.5–8.1)

## 2016-10-15 LAB — CBC WITH DIFFERENTIAL/PLATELET
BASOS ABS: 0.1 10*3/uL (ref 0–0.1)
Basophils Relative: 2 %
EOS PCT: 5 %
Eosinophils Absolute: 0.3 10*3/uL (ref 0–0.7)
HEMATOCRIT: 25.9 % — AB (ref 35.0–47.0)
Hemoglobin: 8.5 g/dL — ABNORMAL LOW (ref 12.0–16.0)
LYMPHS PCT: 32 %
Lymphs Abs: 2 10*3/uL (ref 1.0–3.6)
MCH: 30.9 pg (ref 26.0–34.0)
MCHC: 32.8 g/dL (ref 32.0–36.0)
MCV: 94.4 fL (ref 80.0–100.0)
MONO ABS: 0.6 10*3/uL (ref 0.2–0.9)
MONOS PCT: 9 %
NEUTROS ABS: 3.2 10*3/uL (ref 1.4–6.5)
Neutrophils Relative %: 52 %
PLATELETS: 173 10*3/uL (ref 150–440)
RBC: 2.74 MIL/uL — ABNORMAL LOW (ref 3.80–5.20)
RDW: 18.6 % — AB (ref 11.5–14.5)
WBC: 6.2 10*3/uL (ref 3.6–11.0)

## 2016-10-15 LAB — LIPASE, BLOOD: LIPASE: 78 U/L — AB (ref 11–51)

## 2016-10-15 LAB — HCG, QUANTITATIVE, PREGNANCY: HCG, BETA CHAIN, QUANT, S: 3 m[IU]/mL (ref <5)

## 2016-10-15 MED ORDER — IOPAMIDOL (ISOVUE-370) INJECTION 76%
75.0000 mL | Freq: Once | INTRAVENOUS | Status: AC | PRN
  Administered 2016-10-15: 75 mL via INTRAVENOUS

## 2016-10-15 MED ORDER — OXYCODONE-ACETAMINOPHEN 5-325 MG PO TABS: 1.0000 | ORAL_TABLET | Freq: Four times a day (QID) | ORAL | 0 refills | Status: DC | PRN

## 2016-10-15 MED ORDER — OXYCODONE HCL 5 MG PO TABS
5.0000 mg | ORAL_TABLET | Freq: Once | ORAL | Status: AC
  Administered 2016-10-15: 5 mg via ORAL
  Filled 2016-10-15: qty 1

## 2016-10-15 MED ORDER — ONDANSETRON HCL 4 MG PO TABS: 4.0000 mg | ORAL_TABLET | Freq: Three times a day (TID) | ORAL | 0 refills | Status: AC | PRN

## 2016-10-15 MED ORDER — ACETAMINOPHEN 500 MG PO TABS
1000.0000 mg | ORAL_TABLET | Freq: Once | ORAL | Status: AC
  Administered 2016-10-15: 1000 mg via ORAL
  Filled 2016-10-15: qty 2

## 2016-10-15 NOTE — ED Notes (Signed)
[facility] left phone number at front desk stating they were going to park the [place] and to call when pt ready. Pt ready and waiting in the WR. - no answer at the phone number left x 3. No answer at the business number either.

## 2016-10-15 NOTE — ED Triage Notes (Signed)
Pt came to ED via EMS c/o chest pressure radiating to left jaw starting this morning. Given 1 nitro and 324 asa w/ EMS. Seen here recently for same issue. History of previous MI. Denies sob.

## 2016-10-15 NOTE — ED Provider Notes (Signed)
[facility] Emergency Department Provider Note  ____________________________________________  Time seen: Approximately 9:01 AM  I have reviewed the triage vital signs and the nursing notes.   HISTORY  Chief Complaint Chest Pain   HPI Erin [name] is a 45 y.o. female with a history of ESRD on HD(TTS), chronic chest pain, CVA, hypertension, hyperlipidemia, bipolar disorder who presents for evaluation of chest pain. Patient has been seen here multiple times in the past for similar. Had a left heart catheterization on 12//17 with clear coronary arteries. She reports that this morning she woke up feeling chest pressure located in the left side of her chest, constant, nonradiating, associated with mild dizziness. No shortness of breath. No fever or cough. Patient received one sublingual nitroglycerin per EMS with no improvement of her pain. She reports that the pain radiates to her left jaw, is nonpleuritic, and moderate in intensity at this time. Patient reports that she missed dialysis Thursday because she was too tired and didn't go today as well because of her chest pain. Patient has a history of DVTs and is not on any blood thinners.  Past Medical History:  Diagnosis Date  . Anxiety   . Asthma   . Bipolar affect, depressed (HCC)   . Blind   . Chronic headaches    Seeing Pain Management  . Constipation   . Depression   . Epilepsy (HCC)   . Fibromyalgia   . History of CVA (cerebrovascular accident)   . History of DVT (deep vein thrombosis)   . Hyperlipidemia   . Hypothyroid   . Kidney failure    Currently on Dialysis  . Narcolepsy   . 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 Psychosis     Patient Active Problem List   Diagnosis Date Noted  . Chest pain 10/02/2016  . Kidney failure   . Palliative care encounter   . Goals of care, counseling/discussion   . Hypoglycemia 06/27/2016  . Seizure disorder  (HCC) 06/27/2016  . Blindness 06/27/2016  . Chronic pain 06/27/2016  . History of CVA (cerebrovascular accident) 06/27/2016  . Generalized weakness 03/04/2016  . Hypothyroidism 03/04/2016  . GERD (gastroesophageal reflux disease) 03/04/2016  . End stage renal disease on dialysis (HCC) 03/04/2016  . Bipolar 1 disorder, mixed, moderate (HCC) 02/26/2016  . 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 Anemia 02/25/2016    Past Surgical History:  Procedure Laterality Date  . BRAIN SURGERY  1976  . BREAST LUMPECTOMY  2001  . CARDIAC CATHETERIZATION Right 10/03/2016   Procedure: Coronary Angiography;  Surgeon: Laurier Nancy, MD;  Location: [facility] INVASIVE CV LAB;  Service: Cardiovascular;  Laterality: Right;  . CHOLECYSTECTOMY  2001  . DG AV DIALYSIS GRAFT DECLOT OR     X 4  . GIVENS CAPSULE STUDY N/A 02/29/2016   Procedure: GIVENS CAPSULE STUDY;  Surgeon: Elnita Maxwell, MD;  Location: [facility] ENDOSCOPY;  Service: Endoscopy;  Laterality: N/A;  . PERIPHERAL VASCULAR CATHETERIZATION N/A 08/21/2015   Procedure: Dialysis/Perma Catheter Insertion;  Surgeon: Renford Dills, MD;  Location: [facility] INVASIVE CV LAB;  Service: Cardiovascular;  Laterality: N/A;  . PERIPHERAL VASCULAR CATHETERIZATION N/A 02/29/2016   Procedure: Dialysis/Perma Catheter Removal;  Surgeon: Annice Needy, MD;  Location: [facility] INVASIVE CV LAB;  Service: Cardiovascular;  Laterality: N/A;  . PITUITARY EXCISION  1976    Prior to Admission medications   Medication Sig Start Date End Date Taking? Authorizing Provider  estradiol (ESTRACE) 0.1 MG/GM vaginal cream Place 1 Applicatorful vaginally 2 (two) times a week. Mondays and Thursdays    Historical Provider, MD  fenofibrate (TRICOR) 145 MG tablet Take 145 mg by mouth every other day.    Historical Provider, MD  fludrocortisone (FLORINEF) 0.1 MG tablet Take 0.1 mg by mouth daily.    Historical Provider, MD  furosemide  (LASIX) 40 MG tablet Take 40 mg by mouth 2 (two) times daily.    Historical Provider, MD  gabapentin (NEURONTIN) 100 MG capsule Take 400 mg by mouth at bedtime.    Historical Provider, MD  hydrocortisone (CORTEF) 10 MG tablet Take 5-10 mg by mouth 2 (two) times daily. Pt takes 10 mg in the morning and 5 mg at bedtime.    Historical Provider, MD  HYDROmorphone (DILAUDID) 4 MG tablet Take 1 tablet (4 mg total) by mouth every 4 (four) hours as needed for severe pain. 10/04/16   Wyatt Hasteavid K Hower, MD  levothyroxine (SYNTHROID, LEVOTHROID) 112 MCG tablet Take 112 mcg by mouth daily.     Historical Provider, MD  loratadine (CLARITIN) 10 MG tablet Take 10 mg by mouth daily.    Historical Provider, MD  lovastatin (MEVACOR) 40 MG tablet Take 20 mg by mouth every evening.     Historical Provider, MD  Melatonin 5 MG TABS Take 2 tablets by mouth at bedtime.    Historical Provider, MD  methylphenidate (RITALIN LA) 30 MG 24 hr capsule Take 30 mg by mouth every morning.    Historical Provider, MD  nystatin ointment (MYCOSTATIN) Apply 1 application topically 2 (two) times daily. Apply to affected area (groin, abdominal fold). 06/30/16   Standley Brookinganiel P Goodrich, MD  ondansetron (ZOFRAN) 4 MG tablet Take 1 tablet (4 mg total) by mouth every 8 (eight) hours as needed for nausea or vomiting. 10/15/16   Nita Sicklearolina Tammey Deeg, MD  oxyCODONE-acetaminophen (ROXICET) 5-325 MG tablet Take 1 tablet by mouth every 6 (six) hours as needed. 10/15/16 10/15/17  Nita Sicklearolina Deavon Podgorski, MD  pantoprazole (PROTONIX) 40 MG tablet Take 1 tablet (40 mg total) by mouth daily. 02/27/16   Adrian SaranSital Mody, MD  risperidone (RISPERDAL) 4 MG tablet Take 2-4 mg by mouth 2 (two) times daily. Pt takes 4 mg in the morning and 2 mg at bedtime.    Historical Provider, MD  sertraline (ZOLOFT) 50 MG tablet Take 50 mg by mouth daily.    Historical Provider, MD  sevelamer carbonate (RENVELA) 800 MG tablet Take 2,400 mg by mouth 2 (two) times daily.     Historical Provider, MD    topiramate (TOPAMAX) 200 MG tablet Take 200 mg by mouth 2 (two) times daily. Pt takes with a 50 mg tablet.    Historical Provider, MD  topiramate (TOPAMAX) 50 MG tablet Take 50 mg by mouth 2 (two) times daily. Pt takes with a 200 mg tablet.    Historical Provider, MD  traZODone (DESYREL) 100 MG tablet Take 100 mg by mouth at bedtime.    Historical Provider, MD  Vitamin D, Ergocalciferol, (DRISDOL) 50000 UNITS CAPS capsule Take 50,000 Units by mouth every 30 (thirty) days. Takes on the 8th of every month    Historical Provider, MD    Allergies Dhea [nutritional supplements]; Demerol [meperidine]; Floxin [ofloxacin]; Nsaids; Nubain [nalbuphine hcl]; Phenergan [promethazine hcl]; Stadol [butorphanol]; and Erythromycin  Family History  Problem Relation Age of Onset  . Heart attack Mother   . Migraines Mother   . Aneurysm Mother   . Diabetes Father  Social History Social History  Substance Use Topics  . Smoking status: Current Every Day Smoker    Packs/day: 0.50    Years: 16.00    Types: Cigarettes  . Smokeless tobacco: Never Used  . Alcohol use No     Comment: occasionally    Review of Systems  Constitutional: Negative for fever. Eyes: Negative for visual changes. ENT: Negative for sore throat. Neck: No neck pain  Cardiovascular: + chest pain. Respiratory: Negative for shortness of breath. Gastrointestinal: Negative for abdominal pain, vomiting or diarrhea. Genitourinary: Negative for dysuria. Musculoskeletal: Negative for back pain. Skin: Negative for rash. Neurological: Negative for headaches, weakness or numbness. Psych: No SI or HI  ____________________________________________   PHYSICAL EXAM:  VITAL SIGNS: ED Triage Vitals  Enc Vitals Group     BP 10/15/16 0845 127/72     Pulse Rate 10/15/16 0845 (!) 52     Resp 10/15/16 0845 16     Temp 10/15/16 0845 98.4 F (36.9 C)     Temp Source 10/15/16 0845 Oral     SpO2 10/15/16 0845 98 %     Weight 10/15/16  0846 151 lb (68.5 kg)     Height 10/15/16 0846 4\' 10"  (1.473 m)     Head Circumference --      Peak Flow --      Pain Score --      Pain Loc --      Pain Edu? --      Excl. in GC? --     Constitutional: Alert and oriented, no distress.  HEENT:      Head: Normocephalic and atraumatic.         Eyes: Conjunctivae are normal. Sclera is non-icteric. EOMI. PERRL      Mouth/Throat: Mucous membranes are moist.       Neck: Supple with no signs of meningismus. Cardiovascular: Regular rate and rhythm. No murmurs, gallops, or rubs. 2+ symmetrical distal pulses are present in all extremities. No JVD. Respiratory: Normal respiratory effort. Lungs are clear to auscultation bilaterally. No wheezes, crackles, or rhonchi.  Gastrointestinal: Soft, mild epigastric ttp, and non distended with positive bowel sounds. No rebound or guarding. Musculoskeletal: Nontender with normal range of motion in all extremities. No edema, cyanosis, or erythema of extremities. Neurologic: Normal speech and language. Face is symmetric. Moving all extremities. No gross focal neurologic deficits are appreciated. Skin: Skin is warm, dry and intact. No rash noted. Psychiatric: Mood and affect are normal. Speech and behavior are normal.  ____________________________________________   LABS (all labs ordered are listed, but only abnormal results are displayed)  Labs Reviewed  CBC WITH DIFFERENTIAL/PLATELET - Abnormal; Notable for the following:       Result Value   RBC 2.74 (*)    Hemoglobin 8.5 (*)    HCT 25.9 (*)    RDW 18.6 (*)    All other components within normal limits  COMPREHENSIVE METABOLIC PANEL - Abnormal; Notable for the following:    BUN 33 (*)    Creatinine, Ser 7.00 (*)    Calcium 7.1 (*)    Total Protein 5.9 (*)    Albumin 3.2 (*)    ALT 10 (*)    Alkaline Phosphatase 180 (*)    GFR calc non Af Amer 6 (*)    GFR calc Af Amer 7 (*)    All other components within normal limits  LIPASE, BLOOD -  Abnormal; Notable for the following:    Lipase 78 (*)  All other components within normal limits  TROPONIN I  HCG, QUANTITATIVE, PREGNANCY  TROPONIN I   ____________________________________________  EKG  ED ECG REPORT I, Nita Sickle, the attending physician, personally viewed and interpreted this ECG.  Sinus bradycardia, rate of 53, normal intervals, normal axis, no ST elevations or depressions, T-wave inversions on anterior and lateral leads. Unchanged from prior  ____________________________________________  RADIOLOGY  CTA chest: 1. No pulmonary emboli. 2. Interval small pericardial effusion and minimal bilateral pleural fluid. 3. No significant change in mild subcarinal and right hilar adenopathy. ____________________________________________   PROCEDURES  Procedure(s) performed: None Procedures Critical Care performed:  None ____________________________________________   INITIAL IMPRESSION / ASSESSMENT AND PLAN / ED COURSE  45 y.o. female with a history of ESRD on HD(TTS), chronic chest pain, CVA, hypertension, hyperlipidemia, bipolar disorder who presents for evaluation of chest pain. Patient with a clean left heart catheter 12 days ago. Has missed dialysis on Thursday and today. EKG with no evidence of hyperkalemia. Labs are pending. Pain was not responsive to nitroglycerin. Patient has already received aspirin per EMS. We'll cycle cardiac markers. Patient has mild epigastric tenderness on exam therefore CMP and lipase have been ordered to evaluate for potential GI etiologies of her pain. Patient also has a history of DVTs in the past and is not on anticoagulation. Will pursue CTA to rule out PE.  Clinical Course as of Oct 15 1244  Sat Oct 15, 2016  1020 Patient has mildly elevated lipase at 78 with epigastric tenderness making pancreatitis a possible etiology of her pain. CT of the chest with no acute findings. Patient's potassium is within normal limits, no  oxygen requirement, no acidosis and therefore no indication for emergent dialysis. 2nd troponin pending at 12PM. Will give PO tylenol and oxycodone for pain control.   [CV]  1027 Spoke with patient's HD center in [place] and they are able to dialyse her today after she is discharged. Spoke with [facility] transport who will pick up patient in the ED at 1PM to take her to HD.  [CV]  1242 2nd troponin negative. Patient will be discharge at 1PM. I spoke with transport and they will be here at 1 to take her to HD. Patient given rx for zofran and percocet for pancreatitis. Discussed return precautions with patient.   [CV]    Clinical Course User Index [CV] Nita Sickle, MD    Pertinent labs & imaging results that were available during my care of the patient were reviewed by me and considered in my medical decision making (see chart for details).    ____________________________________________   FINAL CLINICAL IMPRESSION(S) / ED DIAGNOSES  Final diagnoses:  Acute pancreatitis, unspecified complication status, unspecified pancreatitis type      NEW MEDICATIONS STARTED DURING THIS VISIT:  New Prescriptions   ONDANSETRON (ZOFRAN) 4 MG TABLET    Take 1 tablet (4 mg total) by mouth every 8 (eight) hours as needed for nausea or vomiting.   OXYCODONE-ACETAMINOPHEN (ROXICET) 5-325 MG TABLET    Take 1 tablet by mouth every 6 (six) hours as needed.     Note:  This document was prepared using Dragon voice recognition software and may include unintentional dictation errors.    Nita Sickle, MD 10/15/16 1246

## 2016-10-15 NOTE — ED Notes (Signed)
Attempted IV for CT Angio x 1 (failure). Patient refused IV placement in [facility] or above on left arm. No additional available site seen. MD notified.

## 2016-10-15 NOTE — Discharge Instructions (Signed)
1. Pain and nausea: take percocet and zofran as prescribed  2. Diet: low fat solid diet until pain free then advance the diet slowly as tolerated  3. Follow up with your doctor on Monday  4. Return to the ER for fever, worsening abdominal pain, dehydration.

## 2016-10-23 ENCOUNTER — Emergency Department: Payer: Medicare Other

## 2016-10-23 DIAGNOSIS — R079 Chest pain, unspecified: Secondary | ICD-10-CM | POA: Insufficient documentation

## 2016-10-23 DIAGNOSIS — Z79899 Other long term (current) drug therapy: Secondary | ICD-10-CM | POA: Insufficient documentation

## 2016-10-23 DIAGNOSIS — J45909 Unspecified asthma, uncomplicated: Secondary | ICD-10-CM | POA: Insufficient documentation

## 2016-10-23 DIAGNOSIS — E039 Hypothyroidism, unspecified: Secondary | ICD-10-CM | POA: Insufficient documentation

## 2016-10-23 DIAGNOSIS — F17210 Nicotine dependence, cigarettes, uncomplicated: Secondary | ICD-10-CM | POA: Insufficient documentation

## 2016-10-23 DIAGNOSIS — G8929 Other chronic pain: Secondary | ICD-10-CM | POA: Insufficient documentation

## 2016-10-23 LAB — CBC
HCT: 32.2 % — ABNORMAL LOW (ref 35.0–47.0)
Hemoglobin: 10.7 g/dL — ABNORMAL LOW (ref 12.0–16.0)
MCH: 31.3 pg (ref 26.0–34.0)
MCHC: 33.4 g/dL (ref 32.0–36.0)
MCV: 93.9 fL (ref 80.0–100.0)
PLATELETS: 183 10*3/uL (ref 150–440)
RBC: 3.43 MIL/uL — AB (ref 3.80–5.20)
RDW: 19.2 % — ABNORMAL HIGH (ref 11.5–14.5)
WBC: 7.6 10*3/uL (ref 3.6–11.0)

## 2016-10-23 LAB — BASIC METABOLIC PANEL
Anion gap: 7 (ref 5–15)
BUN: 30 mg/dL — ABNORMAL HIGH (ref 6–20)
CALCIUM: 8.1 mg/dL — AB (ref 8.9–10.3)
CO2: 24 mmol/L (ref 22–32)
CREATININE: 5.25 mg/dL — AB (ref 0.44–1.00)
Chloride: 101 mmol/L (ref 101–111)
GFR calc non Af Amer: 9 mL/min — ABNORMAL LOW (ref >60)
GFR, EST AFRICAN AMERICAN: 11 mL/min — AB (ref >60)
Glucose, Bld: 86 mg/dL (ref 65–99)
Potassium: 4.6 mmol/L (ref 3.5–5.1)
SODIUM: 132 mmol/L — AB (ref 135–145)

## 2016-10-23 LAB — TROPONIN I

## 2016-10-23 NOTE — ED Triage Notes (Signed)
Patient reports having left sided chest pain and left "rib" pain that began 15 minutes prior to arrival.

## 2016-10-24 ENCOUNTER — Emergency Department
Admission: EM | Admit: 2016-10-24 | Discharge: 2016-10-24 | Disposition: A | Discharge status: Home / Self Care | Payer: Medicare Other | Attending: Emergency Medicine | Admitting: Emergency Medicine

## 2016-10-24 DIAGNOSIS — G8929 Other chronic pain: Secondary | ICD-10-CM

## 2016-10-24 LAB — LIPASE, BLOOD: Lipase: 45 U/L (ref 11–51)

## 2016-10-24 NOTE — ED Provider Notes (Signed)
[facility] Emergency Department Provider Note   First MD Initiated Contact with Patient 10/24/16 [phone]     (approximate)  I have reviewed the triage vital signs and the nursing notes.   HISTORY  Chief Complaint Chest Pain    HPI Erin [name] is a 45 y.o. female with below her chronic medical conditions presents to the emergency department with left-sided chest pain that is nonradiating. Patient denies any dyspnea no nausea or vomiting. Patient states that she ran out of her Percocet taking her last dose at 5 PM yesterday afternoon.   Past Medical History:  Diagnosis Date  . Anxiety   . Asthma   . Bipolar affect, depressed (HCC)   . Blind   . Chronic headaches    Seeing Pain Management  . Constipation   . Depression   . Epilepsy (HCC)   . Fibromyalgia   . History of CVA (cerebrovascular accident)   . History of DVT (deep vein thrombosis)   . Hyperlipidemia   . Hypothyroid   . Kidney failure    Currently on Dialysis  . Narcolepsy   . 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 Psychosis     Patient Active Problem List   Diagnosis Date Noted  . Chest pain 10/02/2016  . Kidney failure   . Palliative care encounter   . Goals of care, counseling/discussion   . Hypoglycemia 06/27/2016  . Seizure disorder (HCC) 06/27/2016  . Blindness 06/27/2016  . Chronic pain 06/27/2016  . History of CVA (cerebrovascular accident) 06/27/2016  . Generalized weakness 03/04/2016  . Hypothyroidism 03/04/2016  . GERD (gastroesophageal reflux disease) 03/04/2016  . End stage renal disease on dialysis (HCC) 03/04/2016  . Bipolar 1 disorder, mixed, moderate (HCC) 02/26/2016  . 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 Anemia 02/25/2016    Past Surgical History:  Procedure Laterality Date  . BRAIN SURGERY  1976  . BREAST LUMPECTOMY  2001  . CARDIAC  CATHETERIZATION Right 10/03/2016   Procedure: Coronary Angiography;  Surgeon: Laurier Nancy, MD;  Location: [facility] INVASIVE CV LAB;  Service: Cardiovascular;  Laterality: Right;  . CHOLECYSTECTOMY  2001  . DG AV DIALYSIS GRAFT DECLOT OR     X 4  . GIVENS CAPSULE STUDY N/A 02/29/2016   Procedure: GIVENS CAPSULE STUDY;  Surgeon: Elnita Maxwell, MD;  Location: [facility] ENDOSCOPY;  Service: Endoscopy;  Laterality: N/A;  . PERIPHERAL VASCULAR CATHETERIZATION N/A 08/21/2015   Procedure: Dialysis/Perma Catheter Insertion;  Surgeon: Renford Dills, MD;  Location: [facility] INVASIVE CV LAB;  Service: Cardiovascular;  Laterality: N/A;  . PERIPHERAL VASCULAR CATHETERIZATION N/A 02/29/2016   Procedure: Dialysis/Perma Catheter Removal;  Surgeon: Annice Needy, MD;  Location: [facility] INVASIVE CV LAB;  Service: Cardiovascular;  Laterality: N/A;  . PITUITARY EXCISION  1976    Prior to Admission medications   Medication Sig Start Date End Date Taking? Authorizing Provider  estradiol (ESTRACE) 0.1 MG/GM vaginal cream Place 1 Applicatorful vaginally 2 (two) times a week. Mondays and Thursdays    Historical Provider, MD  fenofibrate (TRICOR) 145 MG tablet Take 145 mg by mouth every other day.    Historical Provider, MD  fludrocortisone (FLORINEF) 0.1 MG tablet Take 0.1 mg by mouth daily.    Historical Provider, MD  furosemide (LASIX) 40 MG tablet Take 40 mg by mouth 2 (two) times daily.    Historical Provider, MD  gabapentin (NEURONTIN) 100 MG capsule Take 400 mg  by mouth at bedtime.    Historical Provider, MD  hydrocortisone (CORTEF) 10 MG tablet Take 5-10 mg by mouth 2 (two) times daily. Pt takes 10 mg in the morning and 5 mg at bedtime.    Historical Provider, MD  HYDROmorphone (DILAUDID) 4 MG tablet Take 1 tablet (4 mg total) by mouth every 4 (four) hours as needed for severe pain. 10/04/16   Wyatt Haste, MD  levothyroxine (SYNTHROID, LEVOTHROID) 112 MCG tablet Take 112 mcg by mouth daily.     Historical Provider, MD    loratadine (CLARITIN) 10 MG tablet Take 10 mg by mouth daily.    Historical Provider, MD  lovastatin (MEVACOR) 40 MG tablet Take 20 mg by mouth every evening.     Historical Provider, MD  Melatonin 5 MG TABS Take 2 tablets by mouth at bedtime.    Historical Provider, MD  methylphenidate (RITALIN LA) 30 MG 24 hr capsule Take 30 mg by mouth every morning.    Historical Provider, MD  nystatin ointment (MYCOSTATIN) Apply 1 application topically 2 (two) times daily. Apply to affected area (groin, abdominal fold). 06/30/16   Standley Brooking, MD  ondansetron (ZOFRAN) 4 MG tablet Take 1 tablet (4 mg total) by mouth every 8 (eight) hours as needed for nausea or vomiting. 10/15/16   Nita Sickle, MD  oxyCODONE-acetaminophen (ROXICET) 5-325 MG tablet Take 1 tablet by mouth every 6 (six) hours as needed. 10/15/16 10/15/17  Nita Sickle, MD  pantoprazole (PROTONIX) 40 MG tablet Take 1 tablet (40 mg total) by mouth daily. 02/27/16   [name], MD  risperidone (RISPERDAL) 4 MG tablet Take 2-4 mg by mouth 2 (two) times daily. Pt takes 4 mg in the morning and 2 mg at bedtime.    Historical Provider, MD  sertraline (ZOLOFT) 50 MG tablet Take 50 mg by mouth daily.    Historical Provider, MD  sevelamer carbonate (RENVELA) 800 MG tablet Take 2,400 mg by mouth 2 (two) times daily.     Historical Provider, MD  topiramate (TOPAMAX) 200 MG tablet Take 200 mg by mouth 2 (two) times daily. Pt takes with a 50 mg tablet.    Historical Provider, MD  topiramate (TOPAMAX) 50 MG tablet Take 50 mg by mouth 2 (two) times daily. Pt takes with a 200 mg tablet.    Historical Provider, MD  traZODone (DESYREL) 100 MG tablet Take 100 mg by mouth at bedtime.    Historical Provider, MD  Vitamin D, Ergocalciferol, (DRISDOL) 50000 UNITS CAPS capsule Take 50,000 Units by mouth every 30 (thirty) days. Takes on the 8th of every month    Historical Provider, MD    Allergies Dhea [nutritional supplements]; Demerol [meperidine]; Floxin  [ofloxacin]; Nsaids; Nubain [nalbuphine hcl]; Phenergan [promethazine hcl]; Stadol [butorphanol]; and Erythromycin  Family History  Problem Relation Age of Onset  . Heart attack Mother   . Migraines Mother   . Aneurysm Mother   . Diabetes Father     Social History Social History  Substance Use Topics  . Smoking status: Current Every Day Smoker    Packs/day: 0.50    Years: 16.00    Types: Cigarettes  . Smokeless tobacco: Never Used  . Alcohol use No     Comment: occasionally    Review of Systems Constitutional: No fever/chills Eyes: No visual changes. ENT: No sore throat. Cardiovascular: Positive for chest pain. Respiratory: Denies shortness of breath. Gastrointestinal: No abdominal pain.  No nausea, no vomiting.  No diarrhea.  No constipation. Genitourinary:  Negative for dysuria. Musculoskeletal: Negative for back pain. Skin: Negative for rash. Neurological: Negative for headaches, focal weakness or numbness.  10-point ROS otherwise negative.  ____________________________________________   PHYSICAL EXAM:  VITAL SIGNS: ED Triage Vitals  Enc Vitals Group     BP 10/23/16 2104 119/69     Pulse Rate 10/23/16 2104 60     Resp 10/23/16 2104 20     Temp 10/23/16 2104 97.9 F (36.6 C)     Temp Source 10/23/16 2104 Oral     SpO2 10/23/16 2104 98 %     Weight --      Height --      Head Circumference --      Peak Flow --      Pain Score 10/23/16 2103 9     Pain Loc --      Pain Edu? --      Excl. in GC? --    Constitutional: Alert and oriented. Well appearing and in no acute distress.. Head: Atraumatic. Mouth/Throat: Mucous membranes are moist.  Oropharynx non-erythematous. Neck: No stridor Cardiovascular: Normal rate, regular rhythm. Good peripheral circulation. Grossly normal heart sounds. Respiratory: Normal respiratory effort.  No retractions. Lungs CTAB. Gastrointestinal: Soft and nontender. No distention.  Musculoskeletal: No lower extremity tenderness  nor edema. No gross deformities of extremities. Neurologic:  Normal speech and language. No gross focal neurologic deficits are appreciated.  Skin:  Skin is warm, dry and intact. No rash noted. Psychiatric: Mood and affect are normal. Speech and behavior are normal.  ____________________________________________   LABS (all labs ordered are listed, but only abnormal results are displayed)  Labs Reviewed  BASIC METABOLIC PANEL - Abnormal; Notable for the following:       Result Value   Sodium 132 (*)    BUN 30 (*)    Creatinine, Ser 5.25 (*)    Calcium 8.1 (*)    GFR calc non Af Amer 9 (*)    GFR calc Af Amer 11 (*)    All other components within normal limits  CBC - Abnormal; Notable for the following:    RBC 3.43 (*)    Hemoglobin 10.7 (*)    HCT 32.2 (*)    RDW 19.2 (*)    All other components within normal limits  TROPONIN I  LIPASE, BLOOD   ____________________________________________  EKG  ED ECG REPORT I, Baskin N Ichael Pullara, the attending physician, personally viewed and interpreted this ECG.   Date: 10/24/2016  EKG Time: 9:08 PM  Rate: 67  Rhythm: Sinus rhythm with a right bundle branch block  Axis: Normal  Intervals: Normal  ST&T Change: None  ____________________________________________  RADIOLOGY I, [place]igamme N Albino Bufford, personally viewed and evaluated these images (plain radiographs) as part of my medical decision making, as well as reviewing the written report by the radiologist.  Dg Chest 2 View  Result Date: 10/23/2016 CLINICAL DATA:  Left-sided chest pain for 15 minutes prior to arrival, no known injury, initial encounter EXAM: CHEST  2 VIEW COMPARISON:  10/15/2016 FINDINGS: The heart size and mediastinal contours are within normal limits. Both lungs are clear. The visualized skeletal structures are unremarkable. IMPRESSION: No active cardiopulmonary disease. Electronically Signed   By: Alcide Clever M.D.   On: 10/23/2016 21:48     Procedures    INITIAL IMPRESSION / ASSESSMENT AND PLAN / ED COURSE  Pertinent labs & imaging results that were available during my care of the patient were reviewed by me and considered in my medical  decision making (see chart for details).  45 year old female with multiple medical conditions including chronic chest pain status post a negative catheterization performed on 10/06/2016 return to the emergency department with chest pain stating that she ran out of her Percocet's yesterday evening. EKG revealed no evidence of ischemia or infarction. Patient had similar presentation with a negative PE study performed on 10/15/2016 and snow repeat imaging performed today.    Clinical Course     ____________________________________________  FINAL CLINICAL IMPRESSION(S) / ED DIAGNOSES  Final diagnoses:  Other chronic pain     MEDICATIONS GIVEN DURING THIS VISIT:  Medications - No data to display   NEW OUTPATIENT MEDICATIONS STARTED DURING THIS VISIT:  New Prescriptions   No medications on file    Modified Medications   No medications on file    Discontinued Medications   No medications on file     Note:  This document was prepared using Dragon voice recognition software and may include unintentional dictation errors.    Darci Currentandolph N Charlesetta Milliron, MD 10/24/16 [phone]

## 2016-10-24 NOTE — ED Notes (Signed)
Pt awake and alert, waiting patiently for treatment room 

## 2016-10-26 ENCOUNTER — Emergency Department: Payer: Medicare Other

## 2016-10-26 ENCOUNTER — Observation Stay
Admission: EM | Admit: 2016-10-26 | Discharge: 2016-10-29 | Disposition: A | Discharge status: Home / Self Care | Payer: Medicare Other | Attending: Internal Medicine | Admitting: Internal Medicine

## 2016-10-26 ENCOUNTER — Encounter: Payer: Self-pay | Admitting: Intensive Care

## 2016-10-26 DIAGNOSIS — Z86718 Personal history of other venous thrombosis and embolism: Secondary | ICD-10-CM | POA: Diagnosis not present

## 2016-10-26 DIAGNOSIS — G894 Chronic pain syndrome: Secondary | ICD-10-CM | POA: Diagnosis not present

## 2016-10-26 DIAGNOSIS — N2581 Secondary hyperparathyroidism of renal origin: Secondary | ICD-10-CM | POA: Insufficient documentation

## 2016-10-26 DIAGNOSIS — Y832 Surgical operation with anastomosis, bypass or graft as the cause of abnormal reaction of the patient, or of later complication, without mention of misadventure at the time of the procedure: Secondary | ICD-10-CM | POA: Diagnosis not present

## 2016-10-26 DIAGNOSIS — F29 Unspecified psychosis not due to a substance or known physiological condition: Secondary | ICD-10-CM | POA: Insufficient documentation

## 2016-10-26 DIAGNOSIS — H548 Legal blindness, as defined in USA: Secondary | ICD-10-CM | POA: Diagnosis not present

## 2016-10-26 DIAGNOSIS — Z992 Dependence on renal dialysis: Secondary | ICD-10-CM | POA: Diagnosis not present

## 2016-10-26 DIAGNOSIS — I120 Hypertensive chronic kidney disease with stage 5 chronic kidney disease or end stage renal disease: Secondary | ICD-10-CM | POA: Insufficient documentation

## 2016-10-26 DIAGNOSIS — W19XXXA Unspecified fall, initial encounter: Secondary | ICD-10-CM | POA: Diagnosis not present

## 2016-10-26 DIAGNOSIS — N186 End stage renal disease: Secondary | ICD-10-CM | POA: Insufficient documentation

## 2016-10-26 DIAGNOSIS — K219 Gastro-esophageal reflux disease without esophagitis: Secondary | ICD-10-CM | POA: Insufficient documentation

## 2016-10-26 DIAGNOSIS — F3160 Bipolar disorder, current episode mixed, unspecified: Secondary | ICD-10-CM | POA: Insufficient documentation

## 2016-10-26 DIAGNOSIS — M797 Fibromyalgia: Secondary | ICD-10-CM | POA: Insufficient documentation

## 2016-10-26 DIAGNOSIS — E1122 Type 2 diabetes mellitus with diabetic chronic kidney disease: Secondary | ICD-10-CM | POA: Insufficient documentation

## 2016-10-26 DIAGNOSIS — E785 Hyperlipidemia, unspecified: Secondary | ICD-10-CM | POA: Insufficient documentation

## 2016-10-26 DIAGNOSIS — T82868A Thrombosis of vascular prosthetic devices, implants and grafts, initial encounter: Principal | ICD-10-CM | POA: Insufficient documentation

## 2016-10-26 DIAGNOSIS — F419 Anxiety disorder, unspecified: Secondary | ICD-10-CM | POA: Insufficient documentation

## 2016-10-26 DIAGNOSIS — Z8673 Personal history of transient ischemic attack (TIA), and cerebral infarction without residual deficits: Secondary | ICD-10-CM | POA: Insufficient documentation

## 2016-10-26 DIAGNOSIS — E875 Hyperkalemia: Secondary | ICD-10-CM | POA: Diagnosis not present

## 2016-10-26 DIAGNOSIS — R52 Pain, unspecified: Secondary | ICD-10-CM | POA: Diagnosis present

## 2016-10-26 DIAGNOSIS — Z79899 Other long term (current) drug therapy: Secondary | ICD-10-CM | POA: Diagnosis not present

## 2016-10-26 DIAGNOSIS — M25512 Pain in left shoulder: Secondary | ICD-10-CM | POA: Diagnosis not present

## 2016-10-26 DIAGNOSIS — G40909 Epilepsy, unspecified, not intractable, without status epilepticus: Secondary | ICD-10-CM | POA: Diagnosis not present

## 2016-10-26 DIAGNOSIS — R262 Difficulty in walking, not elsewhere classified: Secondary | ICD-10-CM

## 2016-10-26 DIAGNOSIS — T07XXXA Unspecified multiple injuries, initial encounter: Secondary | ICD-10-CM

## 2016-10-26 DIAGNOSIS — D631 Anemia in chronic kidney disease: Secondary | ICD-10-CM | POA: Insufficient documentation

## 2016-10-26 DIAGNOSIS — G47419 Narcolepsy without cataplexy: Secondary | ICD-10-CM | POA: Insufficient documentation

## 2016-10-26 DIAGNOSIS — F17210 Nicotine dependence, cigarettes, uncomplicated: Secondary | ICD-10-CM | POA: Insufficient documentation

## 2016-10-26 DIAGNOSIS — G629 Polyneuropathy, unspecified: Secondary | ICD-10-CM | POA: Diagnosis not present

## 2016-10-26 DIAGNOSIS — M25551 Pain in right hip: Secondary | ICD-10-CM | POA: Insufficient documentation

## 2016-10-26 DIAGNOSIS — I959 Hypotension, unspecified: Secondary | ICD-10-CM | POA: Insufficient documentation

## 2016-10-26 DIAGNOSIS — E039 Hypothyroidism, unspecified: Secondary | ICD-10-CM | POA: Diagnosis not present

## 2016-10-26 DIAGNOSIS — M6281 Muscle weakness (generalized): Secondary | ICD-10-CM

## 2016-10-26 LAB — COMPREHENSIVE METABOLIC PANEL
ALBUMIN: 3.2 g/dL — AB (ref 3.5–5.0)
ALK PHOS: 218 U/L — AB (ref 38–126)
ALT: 8 U/L — AB (ref 14–54)
ANION GAP: 9 (ref 5–15)
AST: 29 U/L (ref 15–41)
BILIRUBIN TOTAL: 0.4 mg/dL (ref 0.3–1.2)
BUN: 38 mg/dL — ABNORMAL HIGH (ref 6–20)
CALCIUM: 8 mg/dL — AB (ref 8.9–10.3)
CO2: 19 mmol/L — ABNORMAL LOW (ref 22–32)
CREATININE: 7.42 mg/dL — AB (ref 0.44–1.00)
Chloride: 105 mmol/L (ref 101–111)
GFR calc non Af Amer: 6 mL/min — ABNORMAL LOW (ref >60)
GFR, EST AFRICAN AMERICAN: 7 mL/min — AB (ref >60)
GLUCOSE: 71 mg/dL (ref 65–99)
Potassium: 5.5 mmol/L — ABNORMAL HIGH (ref 3.5–5.1)
Sodium: 133 mmol/L — ABNORMAL LOW (ref 135–145)
TOTAL PROTEIN: 5.8 g/dL — AB (ref 6.5–8.1)

## 2016-10-26 LAB — CBC WITH DIFFERENTIAL/PLATELET
Basophils Absolute: 0.3 10*3/uL — ABNORMAL HIGH (ref 0–0.1)
Basophils Relative: 4 %
Eosinophils Absolute: 0.5 10*3/uL (ref 0–0.7)
Eosinophils Relative: 6 %
HEMATOCRIT: 33.1 % — AB (ref 35.0–47.0)
HEMOGLOBIN: 10.9 g/dL — AB (ref 12.0–16.0)
LYMPHS ABS: 2 10*3/uL (ref 1.0–3.6)
LYMPHS PCT: 28 %
MCH: 31 pg (ref 26.0–34.0)
MCHC: 32.9 g/dL (ref 32.0–36.0)
MCV: 94.1 fL (ref 80.0–100.0)
MONOS PCT: 6 %
Monocytes Absolute: 0.4 10*3/uL (ref 0.2–0.9)
NEUTROS ABS: 4 10*3/uL (ref 1.4–6.5)
NEUTROS PCT: 56 %
Platelets: 197 10*3/uL (ref 150–440)
RBC: 3.52 MIL/uL — ABNORMAL LOW (ref 3.80–5.20)
RDW: 20.1 % — ABNORMAL HIGH (ref 11.5–14.5)
WBC: 7.1 10*3/uL (ref 3.6–11.0)

## 2016-10-26 MED ORDER — LEVOTHYROXINE SODIUM 112 MCG PO TABS
112.0000 ug | ORAL_TABLET | Freq: Every day | ORAL | Status: DC
Start: 2016-10-27 — End: 2016-10-29
  Administered 2016-10-27 – 2016-10-29 (×2): 112 ug via ORAL
  Filled 2016-10-26 (×2): qty 1

## 2016-10-26 MED ORDER — OXYCODONE-ACETAMINOPHEN 5-325 MG PO TABS
1.0000 | ORAL_TABLET | Freq: Four times a day (QID) | ORAL | Status: DC | PRN
  Administered 2016-10-27 – 2016-10-29 (×4): 1 via ORAL
  Filled 2016-10-26 (×4): qty 1

## 2016-10-26 MED ORDER — FLUDROCORTISONE ACETATE 0.1 MG PO TABS
0.1000 mg | ORAL_TABLET | Freq: Every day | ORAL | Status: DC
  Administered 2016-10-27 – 2016-10-29 (×3): 0.1 mg via ORAL
  Filled 2016-10-26 (×4): qty 1

## 2016-10-26 MED ORDER — DIPHENHYDRAMINE HCL 25 MG PO CAPS
25.0000 mg | ORAL_CAPSULE | Freq: Every evening | ORAL | Status: DC | PRN
  Administered 2016-10-26: 25 mg via ORAL
  Filled 2016-10-26: qty 1

## 2016-10-26 MED ORDER — SEVELAMER CARBONATE 800 MG PO TABS
2400.0000 mg | ORAL_TABLET | Freq: Two times a day (BID) | ORAL | Status: DC
  Administered 2016-10-28 – 2016-10-29 (×2): 2400 mg via ORAL
  Filled 2016-10-26 (×5): qty 3

## 2016-10-26 MED ORDER — ONDANSETRON HCL 4 MG/2ML IJ SOLN: 4.0000 mg | Freq: Four times a day (QID) | INTRAMUSCULAR | Status: DC | PRN

## 2016-10-26 MED ORDER — ALBUTEROL SULFATE (2.5 MG/3ML) 0.083% IN NEBU: 2.5000 mg | INHALATION_SOLUTION | Freq: Four times a day (QID) | RESPIRATORY_TRACT | Status: DC | PRN

## 2016-10-26 MED ORDER — TRAZODONE HCL 100 MG PO TABS
100.0000 mg | ORAL_TABLET | Freq: Every day | ORAL | Status: DC
  Administered 2016-10-26 – 2016-10-28 (×3): 100 mg via ORAL
  Filled 2016-10-26 (×3): qty 1

## 2016-10-26 MED ORDER — MAGNESIUM CITRATE PO SOLN
1.0000 | Freq: Once | ORAL | Status: DC | PRN
  Filled 2016-10-26: qty 296

## 2016-10-26 MED ORDER — ONDANSETRON HCL 4 MG/2ML IJ SOLN
4.0000 mg | Freq: Once | INTRAMUSCULAR | Status: DC
  Filled 2016-10-26: qty 2

## 2016-10-26 MED ORDER — HYDROCORTISONE 5 MG PO TABS: 5.0000 mg | ORAL_TABLET | Freq: Two times a day (BID) | ORAL | Status: DC

## 2016-10-26 MED ORDER — ONDANSETRON HCL 4 MG PO TABS: 4.0000 mg | ORAL_TABLET | Freq: Four times a day (QID) | ORAL | Status: DC | PRN

## 2016-10-26 MED ORDER — FENOFIBRATE 160 MG PO TABS
160.0000 mg | ORAL_TABLET | Freq: Every day | ORAL | Status: DC
  Administered 2016-10-27 – 2016-10-29 (×3): 160 mg via ORAL
  Filled 2016-10-26 (×4): qty 1

## 2016-10-26 MED ORDER — RISPERIDONE 1 MG PO TABS: 2.0000 mg | ORAL_TABLET | Freq: Two times a day (BID) | ORAL | Status: DC

## 2016-10-26 MED ORDER — GABAPENTIN 400 MG PO CAPS
400.0000 mg | ORAL_CAPSULE | Freq: Every day | ORAL | Status: DC
  Administered 2016-10-26 – 2016-10-28 (×3): 400 mg via ORAL
  Filled 2016-10-26 (×3): qty 1

## 2016-10-26 MED ORDER — PRAVASTATIN SODIUM 20 MG PO TABS
20.0000 mg | ORAL_TABLET | Freq: Every day | ORAL | Status: DC
  Administered 2016-10-27 – 2016-10-29 (×3): 20 mg via ORAL
  Filled 2016-10-26 (×3): qty 1

## 2016-10-26 MED ORDER — MORPHINE SULFATE (PF) 4 MG/ML IV SOLN
1.0000 mg | INTRAVENOUS | Status: DC | PRN
  Administered 2016-10-27: 1 mg via INTRAVENOUS
  Filled 2016-10-26 (×2): qty 1

## 2016-10-26 MED ORDER — ACETAMINOPHEN 650 MG RE SUPP: 650.0000 mg | Freq: Four times a day (QID) | RECTAL | Status: DC | PRN

## 2016-10-26 MED ORDER — HYDROMORPHONE HCL 1 MG/ML IJ SOLN
0.5000 mg | Freq: Once | INTRAMUSCULAR | Status: DC
  Filled 2016-10-26: qty 1

## 2016-10-26 MED ORDER — HYDROCORTISONE 10 MG PO TABS
10.0000 mg | ORAL_TABLET | Freq: Every morning | ORAL | Status: DC
  Administered 2016-10-27 – 2016-10-29 (×3): 10 mg via ORAL
  Filled 2016-10-26 (×3): qty 1

## 2016-10-26 MED ORDER — HYDROMORPHONE HCL 1 MG/ML IJ SOLN
1.0000 mg | Freq: Once | INTRAMUSCULAR | Status: AC
  Administered 2016-10-26: 1 mg via INTRAMUSCULAR

## 2016-10-26 MED ORDER — ONDANSETRON 4 MG PO TBDP
4.0000 mg | ORAL_TABLET | Freq: Once | ORAL | Status: AC
  Administered 2016-10-26: 4 mg via ORAL
  Filled 2016-10-26: qty 1

## 2016-10-26 MED ORDER — RISPERIDONE 1 MG PO TABS
4.0000 mg | ORAL_TABLET | Freq: Every morning | ORAL | Status: DC
  Administered 2016-10-27 – 2016-10-29 (×4): 4 mg via ORAL
  Filled 2016-10-26 (×3): qty 4

## 2016-10-26 MED ORDER — MELATONIN 5 MG PO TABS
2.0000 | ORAL_TABLET | Freq: Every day | ORAL | Status: DC
  Administered 2016-10-26 – 2016-10-28 (×3): 10 mg via ORAL
  Filled 2016-10-26 (×4): qty 2

## 2016-10-26 MED ORDER — TOPIRAMATE 25 MG PO TABS
50.0000 mg | ORAL_TABLET | Freq: Two times a day (BID) | ORAL | Status: DC
  Administered 2016-10-26 – 2016-10-29 (×6): 50 mg via ORAL
  Filled 2016-10-26 (×7): qty 2

## 2016-10-26 MED ORDER — TOPIRAMATE 100 MG PO TABS
200.0000 mg | ORAL_TABLET | Freq: Two times a day (BID) | ORAL | Status: DC
  Administered 2016-10-26 – 2016-10-29 (×6): 200 mg via ORAL
  Filled 2016-10-26 (×7): qty 2

## 2016-10-26 MED ORDER — ACETAMINOPHEN 325 MG PO TABS: 650.0000 mg | ORAL_TABLET | Freq: Four times a day (QID) | ORAL | Status: DC | PRN

## 2016-10-26 MED ORDER — RISPERIDONE 1 MG PO TABS
2.0000 mg | ORAL_TABLET | Freq: Every day | ORAL | Status: DC
  Administered 2016-10-26 – 2016-10-28 (×3): 2 mg via ORAL
  Filled 2016-10-26 (×3): qty 2

## 2016-10-26 MED ORDER — HYDROMORPHONE HCL 1 MG/ML IJ SOLN
0.5000 mg | Freq: Once | INTRAMUSCULAR | Status: AC
  Administered 2016-10-26: 0.5 mg via INTRAMUSCULAR

## 2016-10-26 MED ORDER — FUROSEMIDE 40 MG PO TABS
40.0000 mg | ORAL_TABLET | Freq: Two times a day (BID) | ORAL | Status: DC
  Administered 2016-10-27 – 2016-10-29 (×5): 40 mg via ORAL
  Filled 2016-10-26 (×6): qty 1

## 2016-10-26 MED ORDER — BISACODYL 5 MG PO TBEC: 5.0000 mg | DELAYED_RELEASE_TABLET | Freq: Every day | ORAL | Status: DC | PRN

## 2016-10-26 MED ORDER — HYDROMORPHONE HCL 1 MG/ML IJ SOLN
INTRAMUSCULAR | Status: AC
  Administered 2016-10-26: 1 mg via INTRAMUSCULAR
  Filled 2016-10-26: qty 1

## 2016-10-26 MED ORDER — LORATADINE 10 MG PO TABS
10.0000 mg | ORAL_TABLET | Freq: Every day | ORAL | Status: DC
  Administered 2016-10-27 – 2016-10-29 (×3): 10 mg via ORAL
  Filled 2016-10-26 (×3): qty 1

## 2016-10-26 MED ORDER — HYDROCORTISONE 10 MG PO TABS
5.0000 mg | ORAL_TABLET | Freq: Every day | ORAL | Status: DC
  Administered 2016-10-26 – 2016-10-28 (×3): 5 mg via ORAL
  Filled 2016-10-26: qty 0.5
  Filled 2016-10-26: qty 2
  Filled 2016-10-26 (×2): qty 1

## 2016-10-26 MED ORDER — METHYLPHENIDATE HCL ER (LA) 10 MG PO CP24
30.0000 mg | ORAL_CAPSULE | ORAL | Status: DC
Start: 2016-10-27 — End: 2016-10-26

## 2016-10-26 MED ORDER — SENNOSIDES-DOCUSATE SODIUM 8.6-50 MG PO TABS: 1.0000 | ORAL_TABLET | Freq: Every evening | ORAL | Status: DC | PRN

## 2016-10-26 MED ORDER — PANTOPRAZOLE SODIUM 40 MG PO TBEC
40.0000 mg | DELAYED_RELEASE_TABLET | Freq: Every day | ORAL | Status: DC
  Administered 2016-10-27 – 2016-10-29 (×3): 40 mg via ORAL
  Filled 2016-10-26 (×3): qty 1

## 2016-10-26 MED ORDER — VITAMIN D (ERGOCALCIFEROL) 1.25 MG (50000 UNIT) PO CAPS: 50000.0000 [IU] | ORAL_CAPSULE | ORAL | Status: DC

## 2016-10-26 MED ORDER — HEPARIN SODIUM (PORCINE) 5000 UNIT/ML IJ SOLN
5000.0000 [IU] | Freq: Three times a day (TID) | INTRAMUSCULAR | Status: DC
  Filled 2016-10-26 (×4): qty 1

## 2016-10-26 MED ORDER — ESTRADIOL 0.1 MG/GM VA CREA
1.0000 | TOPICAL_CREAM | VAGINAL | Status: DC
  Filled 2016-10-26: qty 42.5

## 2016-10-26 MED ORDER — SERTRALINE HCL 50 MG PO TABS
50.0000 mg | ORAL_TABLET | Freq: Every day | ORAL | Status: DC
  Administered 2016-10-27 – 2016-10-29 (×3): 50 mg via ORAL
  Filled 2016-10-26 (×4): qty 1

## 2016-10-26 NOTE — ED Triage Notes (Signed)
Pt arrived by ems from home for fall last night at 9:00pm. Patient is legally blind and has home health visits 1-2 X a week. Patient c/o pain in L shoulder, R groin area, and lower back pain and all tender with papation. HX ESRD, hypoglycemic, and seizures. EMS vitals b/p 136/63, HR 68, 95RA, blood sugar 88. Active fistula in R arm.

## 2016-10-26 NOTE — Progress Notes (Signed)
Pt complaining of Generalized itching. Primary notified Dr. Anne Hahn. MD to place orders. Pt requested to take evening  dose of  40 mg lasix. MD gave orders to give dose. Primary nurse to continue to monitor.

## 2016-10-26 NOTE — ED Notes (Signed)
Patient transported to CT 

## 2016-10-26 NOTE — ED Notes (Signed)
Pt provided with applesauce and cup of ice, per request.

## 2016-10-26 NOTE — ED Notes (Signed)
Upon addressing pts pain, pt sts "I normally get a full dose of dilaudid, what that doctor gave me was not enough. I also don't want an IV either, it hurts to much. "  MD made aware.

## 2016-10-26 NOTE — ED Notes (Signed)
Pt taken to bathroom, bedside toilet used.

## 2016-10-26 NOTE — H&P (Signed)
[facility] -  @ [facility] Admission History and Physical [employer], D.O.    Patient Name: Erin [name] MR#: [id number] Date of Birth: [birth year] Date of Admission: 10/26/2016  Referring MD/NP/PA: Dr. Darnelle Catalan Primary Care Physician: [facility] PRIMARY CARE Outpatient Specialists: Dr. Wynelle Link  Patient coming from: Home  Chief Complaint: Fall  HPI: Wednesday [name] is a 45 y.o. female with a known history of ESRD on HD, legally blind  presents to the emergency department for evaluation of mechanical fall.  Patient was in a usual state of health until last night when she tripped over her comforter which was on the floor. She states that she landed in the splits and immediately developed low back pain.. She presented to the emergency department today for evaluation of low back and left shoulder pain. She denies head trauma and loss of consciousness. She denies any preceding symptoms such as dizziness, lightheadedness, headache.   Otherwise there has been no change in status. Patient has been taking medication as prescribed and there has been no recent change in medication or diet.  There has been no recent illness, travel or sick contacts.    Patient denies fevers/chills, weakness, dizziness, chest pain, shortness of breath, N/V/C/D, abdominal pain, dysuria/frequency, changes in mental status.   ED Course: Patient was cleared for discharge however she was unable to ambulate secondary to intractable pain and therefore admission was requested.  Review of Systems:  CONSTITUTIONAL: No fever/chills, fatigue, weakness, weight gain/loss, headache. EYES: No blurry or double vision. ENT: No tinnitus, postnasal drip, redness or soreness of the oropharynx. RESPIRATORY: No cough, dyspnea, wheeze, hemoptysis.  CARDIOVASCULAR: No chest pain, palpitations, syncope, orthopnea,  GASTROINTESTINAL: No nausea, vomiting, abdominal pain, constipation, diarrhea.  No hematemesis, melena or  hematochezia. GENITOURINARY: No dysuria, frequency, hematuria. ENDOCRINE: No polyuria or nocturia. No heat or cold intolerance. HEMATOLOGY: No anemia, bruising, bleeding. INTEGUMENTARY: No rashes, ulcers, lesions. MUSCULOSKELETAL: No arthritis, gout, dyspnea. Positive left shoulder and low back pain NEUROLOGIC: No numbness, tingling, ataxia, seizure-type activity, weakness. PSYCHIATRIC: No anxiety, depression, insomnia.   Past Medical History:  Diagnosis Date  . Anxiety   . Asthma   . Bipolar affect, depressed (HCC)   . Blind   . Chronic headaches    Seeing Pain Management  . Constipation   . Depression   . Epilepsy (HCC)   . Fibromyalgia   . History of CVA (cerebrovascular accident)   . History of DVT (deep vein thrombosis)   . Hyperlipidemia   . Hypothyroid   . Kidney failure    Currently on Dialysis  . Narcolepsy   . 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 Psychosis     Past Surgical History:  Procedure Laterality Date  . BRAIN SURGERY  1976  . BREAST LUMPECTOMY  2001  . CARDIAC CATHETERIZATION Right 10/03/2016   Procedure: Coronary Angiography;  Surgeon: Laurier Nancy, MD;  Location: [facility] INVASIVE CV LAB;  Service: Cardiovascular;  Laterality: Right;  . CHOLECYSTECTOMY  2001  . DG AV DIALYSIS GRAFT DECLOT OR     X 4  . GIVENS CAPSULE STUDY N/A 02/29/2016   Procedure: GIVENS CAPSULE STUDY;  Surgeon: Elnita Maxwell, MD;  Location: [facility] ENDOSCOPY;  Service: Endoscopy;  Laterality: N/A;  . PERIPHERAL VASCULAR CATHETERIZATION N/A 08/21/2015   Procedure: Dialysis/Perma Catheter Insertion;  Surgeon: Renford Dills, MD;  Location: [facility] INVASIVE CV LAB;  Service: Cardiovascular;  Laterality: N/A;  . PERIPHERAL VASCULAR CATHETERIZATION N/A 02/29/2016   Procedure: Dialysis/Perma Catheter Removal;  Surgeon: Annice Needy, MD;  Location: [facility] INVASIVE CV LAB;  Service: Cardiovascular;  Laterality: N/A;  . PITUITARY EXCISION  1976      reports that she has been smoking Cigarettes.  She has a 8.00 pack-year smoking history. She has never used smokeless tobacco. She reports that she does not drink alcohol or use drugs.  Allergies  Allergen Reactions  . Dhea [Nutritional Supplements] Anaphylaxis    Patient states medication is DHE for migraines, not DHEA  . Demerol [Meperidine] Other (See Comments)    Reaction:  Hallucinations   . Floxin [Ofloxacin] Other (See Comments)    Reaction:  Hallucinations   . Nsaids Nausea And Vomiting and Swelling  . Nubain [Nalbuphine Hcl] Other (See Comments)    Reaction:  Hallucinations   . Phenergan [Promethazine Hcl] Other (See Comments)    Reaction:  Restless legs   . Stadol [Butorphanol] Other (See Comments)    Reaction:  Hallucinations   . Erythromycin Diarrhea and Rash    Family History  Problem Relation Age of Onset  . Heart attack Mother   . Migraines Mother   . Aneurysm Mother   . Diabetes Father    Family history has been reviewed and confirmed with patient.   Prior to Admission medications   Medication Sig Start Date End Date Taking? Authorizing Provider  estradiol (ESTRACE) 0.1 MG/GM vaginal cream Place 1 Applicatorful vaginally 2 (two) times a week. Mondays and Thursdays   Yes Historical Provider, MD  fenofibrate (TRICOR) 145 MG tablet Take 145 mg by mouth every other day.   Yes Historical Provider, MD  fludrocortisone (FLORINEF) 0.1 MG tablet Take 0.1 mg by mouth daily.   Yes Historical Provider, MD  furosemide (LASIX) 40 MG tablet Take 40 mg by mouth 2 (two) times daily.   Yes Historical Provider, MD  gabapentin (NEURONTIN) 100 MG capsule Take 400 mg by mouth at bedtime.   Yes Historical Provider, MD  hydrocortisone (CORTEF) 10 MG tablet Take 5-10 mg by mouth 2 (two) times daily. Pt takes 10 mg in the morning and 5 mg at bedtime.   Yes Historical Provider, MD  levothyroxine (SYNTHROID, LEVOTHROID) 112 MCG tablet Take 112 mcg by mouth daily.    Yes Historical Provider,  MD  loratadine (CLARITIN) 10 MG tablet Take 10 mg by mouth daily.   Yes Historical Provider, MD  lovastatin (MEVACOR) 40 MG tablet Take 20 mg by mouth every evening.    Yes Historical Provider, MD  Melatonin 5 MG TABS Take 2 tablets by mouth at bedtime.   Yes Historical Provider, MD  methylphenidate (RITALIN LA) 30 MG 24 hr capsule Take 30 mg by mouth every morning.   Yes Historical Provider, MD  nystatin ointment (MYCOSTATIN) Apply 1 application topically 2 (two) times daily. Apply to affected area (groin, abdominal fold). 06/30/16  Yes Standley Brooking, MD  pantoprazole (PROTONIX) 40 MG tablet Take 1 tablet (40 mg total) by mouth daily. 02/27/16  Yes [name], MD  risperidone (RISPERDAL) 4 MG tablet Take 2-4 mg by mouth 2 (two) times daily. Pt takes 4 mg in the morning and 2 mg at bedtime.   Yes Historical Provider, MD  sertraline (ZOLOFT) 50 MG tablet Take 50 mg by mouth daily.   Yes Historical Provider, MD  sevelamer carbonate (RENVELA) 800 MG tablet Take 2,400 mg by mouth 2 (two) times daily.    Yes Historical Provider, MD  topiramate (TOPAMAX) 200 MG tablet Take 200 mg by mouth 2 (two) times  daily. Pt takes with a 50 mg tablet.   Yes Historical Provider, MD  topiramate (TOPAMAX) 50 MG tablet Take 50 mg by mouth 2 (two) times daily. Pt takes with a 200 mg tablet.   Yes Historical Provider, MD  traZODone (DESYREL) 100 MG tablet Take 100 mg by mouth at bedtime.   Yes Historical Provider, MD  Vitamin D, Ergocalciferol, (DRISDOL) 50000 UNITS CAPS capsule Take 50,000 Units by mouth every 30 (thirty) days. Takes on the 8th of every month   Yes Historical Provider, MD  HYDROmorphone (DILAUDID) 4 MG tablet Take 1 tablet (4 mg total) by mouth every 4 (four) hours as needed for severe pain. Patient not taking: Reported on 10/26/2016 10/04/16   Wyatt Haste, MD  ondansetron (ZOFRAN) 4 MG tablet Take 1 tablet (4 mg total) by mouth every 8 (eight) hours as needed for nausea or vomiting. Patient not taking:  Reported on 10/26/2016 10/15/16   Nita Sickle, MD  oxyCODONE-acetaminophen (ROXICET) 5-325 MG tablet Take 1 tablet by mouth every 6 (six) hours as needed. Patient not taking: Reported on 10/26/2016 10/15/16 10/15/17  Nita Sickle, MD    Physical Exam: Vitals:   10/26/16 1614 10/26/16 1615 10/26/16 1700 10/26/16 1900  BP: (!) 145/81  (!) 153/75 123/64  Pulse: 65  74 64  Resp: 18  16 19   Temp: 98.2 F (36.8 C)     TempSrc: Oral     SpO2: 96%  96% 99%  Weight:  68.5 kg (151 lb)    Height:  4\' 10"  (1.473 m)      GENERAL: 45 y.o.-year-old White female patient, chronically ill-appearing, sitting up in the bed in no acute distress.  Pleasant and cooperative.   HEENT: Head atraumatic, normocephalic. Pupils equal, round, reactive to light and accommodation. No scleral icterus. Extraocular muscles intact. Nares are patent. Oropharynx is clear. Mucus membranes moist. NECK: Supple, full range of motion. No JVD, no bruit heard. No thyroid enlargement, no tenderness, no cervical lymphadenopathy. CHEST: Normal breath sounds bilaterally. No wheezing, rales, rhonchi or crackles. No use of accessory muscles of respiration.  No reproducible chest wall tenderness.  CARDIOVASCULAR: S1, S2 normal. No murmurs, rubs, or gallops. Cap refill <2 seconds. Pulses intact distally.  ABDOMEN: Soft, nondistended, nontender, . No rebound, guarding, rigidity. Normoactive bowel sounds present in all four quadrants. No organomegaly or mass. EXTREMITIES: No pedal edema, cyanosis, or clubbing. Positive left shoulder tenderness anteriorly, lumbar paravertebral muscle spasm and tenderness to palpation. No midline tenderness. NEUROLOGIC: Cranial nerves II through XII are grossly intact with no focal sensorimotor deficit. Muscle strength 5/5 in all extremities. Sensation intact. Gait not checked. Straight leg raise test is negative. PSYCHIATRIC: The patient is alert and oriented x 3. Normal affect, mood, thought  content. SKIN: Warm, dry, and intact without obvious rash, lesion, or ulcer.   Labs on Admission: I have personally reviewed following labs and imaging studies  CBC:  Recent Labs Lab 10/23/16 2112 10/26/16 1651  WBC 7.6 7.1  NEUTROABS  --  4.0  HGB 10.7* 10.9*  HCT 32.2* 33.1*  MCV 93.9 94.1  PLT 183 197   Basic Metabolic Panel:  Recent Labs Lab 10/23/16 2112 10/26/16 1651  NA 132* 133*  K 4.6 5.5*  CL 101 105  CO2 24 19*  GLUCOSE 86 71  BUN 30* 38*  CREATININE 5.25* 7.42*  CALCIUM 8.1* 8.0*   GFR: Estimated Creatinine Clearance: 8 mL/min (by C-G formula based on SCr of 7.42 mg/dL (H)). Liver Function Tests:  Recent Labs Lab 10/26/16 1651  AST 29  ALT 8*  ALKPHOS 218*  BILITOT 0.4  PROT 5.8*  ALBUMIN 3.2*    Recent Labs Lab 10/23/16 2112  LIPASE 45   No results for input(s): AMMONIA in the last 168 hours. Coagulation Profile: No results for input(s): INR, PROTIME in the last 168 hours. Cardiac Enzymes:  Recent Labs Lab 10/23/16 2112  TROPONINI <0.03   BNP (last 3 results) No results for input(s): PROBNP in the last 8760 hours. HbA1C: No results for input(s): HGBA1C in the last 72 hours. CBG: No results for input(s): GLUCAP in the last 168 hours. Lipid Profile: No results for input(s): CHOL, HDL, LDLCALC, TRIG, CHOLHDL, LDLDIRECT in the last 72 hours. Thyroid Function Tests: No results for input(s): TSH, T4TOTAL, FREET4, T3FREE, THYROIDAB in the last 72 hours. Anemia Panel: No results for input(s): VITAMINB12, FOLATE, FERRITIN, TIBC, IRON, RETICCTPCT in the last 72 hours. Urine analysis:    Component Value Date/Time   COLORURINE YELLOW (A) 08/31/2016 1648   APPEARANCEUR CLEAR (A) 08/31/2016 1648   APPEARANCEUR CLEAR 11/15/2013 2000   LABSPEC 1.006 08/31/2016 1648   LABSPEC 1.005 11/15/2013 2000   PHURINE 8.0 08/31/2016 1648   GLUCOSEU NEGATIVE 08/31/2016 1648   GLUCOSEU NEGATIVE 11/15/2013 2000   HGBUR NEGATIVE 08/31/2016 1648    BILIRUBINUR NEGATIVE 08/31/2016 1648   BILIRUBINUR NEGATIVE 11/15/2013 2000   KETONESUR NEGATIVE 08/31/2016 1648   PROTEINUR 100 (A) 08/31/2016 1648   NITRITE NEGATIVE 08/31/2016 1648   LEUKOCYTESUR NEGATIVE 08/31/2016 1648   LEUKOCYTESUR NEGATIVE 11/15/2013 2000   Sepsis Labs: @LABRCNTIP (procalcitonin:4,lacticidven:4) )No results found for this or any previous visit (from the past 240 hour(s)).   Radiological Exams on Admission: Dg Lumbar Spine Complete  Result Date: 10/26/2016 CLINICAL DATA:  Lower back pain after fall last night. EXAM: LUMBAR SPINE - COMPLETE 4+ VIEW COMPARISON:  CT scan of July 04, 2016. FINDINGS: There is no evidence of lumbar spine fracture. Alignment is normal. Intervertebral disc spaces are maintained. IMPRESSION: Normal lumbar spine. Electronically Signed   By: Lupita RaiderJames  Green Jr, M.D.   On: 10/26/2016 18:57   Dg Chest Portable 1 View  Result Date: 10/26/2016 CLINICAL DATA:  Status post fall, with left shoulder pain. Initial encounter. EXAM: PORTABLE CHEST 1 VIEW COMPARISON:  Chest radiograph performed 10/23/2016 FINDINGS: The lungs are well-aerated and clear. There is no evidence of focal opacification, pleural effusion or pneumothorax. The cardiomediastinal silhouette is borderline normal in size. No acute osseous abnormalities are seen. IMPRESSION: No acute cardiopulmonary process seen. Electronically Signed   By: Roanna RaiderJeffery  Chang M.D.   On: 10/26/2016 19:26   Dg Shoulder Left  Result Date: 10/26/2016 CLINICAL DATA:  Patient fell last night and complains of left shoulder pain. EXAM: LEFT SHOULDER - 2+ VIEW COMPARISON:  07/09/2014 FINDINGS: Bones are diffusely demineralized. No evidence for an acute fracture. No evidence for shoulder dislocation or separation. Vascular stents noted proximal left all arm with apparent graft in situ. IMPRESSION: Negative. Electronically Signed   By: Kennith CenterEric  Mansell M.D.   On: 10/26/2016 18:54   Dg Hip Unilat W Or Wo Pelvis 2-3 Views  Right  Result Date: 10/26/2016 CLINICAL DATA:  Status post fall, with right hip pain. Initial encounter. EXAM: DG HIP (WITH OR WITHOUT PELVIS) 2-3V RIGHT COMPARISON:  Right hip radiographs performed 05/09/2016 FINDINGS: There is no evidence of fracture or dislocation. There is mildly increased uncovering of the right femoral head, suggestive of an underlying right hip joint effusion. Slight cortical irregularity is  noted along the right femoral head, with mild chronic flattening. The left hip joint is unremarkable in appearance. The sacroiliac joints are unremarkable in appearance. The visualized bowel gas pattern is grossly unremarkable in appearance. A vascular stent is noted overlying the proximal left thigh. IMPRESSION: 1. No evidence of fracture or dislocation. 2. Mildly increased uncovering of the right femoral head, suggestive of an underlying right hip joint effusion. Slight cortical irregularity along the right femoral head, with mild chronic flattening. Electronically Signed   By: Roanna Raider M.D.   On: 10/26/2016 18:56    Assessment/Plan Active Problems:   Intractable pain    This is a 45 y.o. female with a history of end-stage renal disease on hemodialysis, legally blind, CVA, DVT, hyperlipidemia now being admitted with: 1. Intractable pain status post mechanical fall-admit to observation for pain control, PT evaluation. Patient is due for dialysis in the morning and ideally will go home following her session. 2. History of hyperlipidemia-continue TriCor and Mevacor 3. History of end-stage renal disease on hemodialysis-continue Florinef, hydrocortisone, Renvela, vitamin D 4. History of neuropathy and chronic pain-continue Neurontin, Topamax 5. History of hypothyroid-continue Synthroid 6. History of allergies-continue Claritin 6. History of GERD-continue Protonix 7. History of psychosis, anxiety and depression-continue risperidone, Ritalin, Zoloft, trazodone  Admission status:  Observation IV Fluids: Hep-Lock Diet/Nutrition: Renal Consults called: Nephrology, PT  DVT Px: Lovenox, SCDs and early ambulation Code Status: Full Code  Disposition Plan: To home in less than 24 hours.   All the records are reviewed and case discussed with ED provider. Management plans discussed with the patient and/or family who express understanding and agree with plan of care.  Letrell Attwood D.O. on 10/26/2016 at 7:59 PM Between 7am to 6pm - Pager - [phone] After 6pm go to [email or web address] - Social research officer, government [employer]  Hospitalists Office [phone] CC: Primary care physician; [facility] PRIMARY CARE   10/26/2016, 7:59 PM

## 2016-10-26 NOTE — ED Provider Notes (Signed)
[facility] Emergency Department Provider Note   ____________________________________________   First MD Initiated Contact with Patient 10/26/16 1623     (approximate)  I have reviewed the triage vital signs and the nursing notes.   HISTORY  Chief Complaint Fall   HPI Erin [name] is a 45 y.o. female fell last night. Patient as noted in nurse's notes is legally blind and has home health visits. Patient told the home health nurse this morning she had pain in the left shoulder where there is a bruise in the right groin in the low back all the areas are tender with palpation or movement. Patient denies loss of consciousness denies hitting her head. Pain is moderately severe.   Past Medical History:  Diagnosis Date  . Anxiety   . Asthma   . Bipolar affect, depressed (HCC)   . Blind   . Chronic headaches    Seeing Pain Management  . Constipation   . Depression   . Epilepsy (HCC)   . Fibromyalgia   . History of CVA (cerebrovascular accident)   . History of DVT (deep vein thrombosis)   . Hyperlipidemia   . Hypothyroid   . Kidney failure    Currently on Dialysis  . Narcolepsy   . 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 Psychosis     Patient Active Problem List   Diagnosis Date Noted  . Chest pain 10/02/2016  . Kidney failure   . Palliative care encounter   . Goals of care, counseling/discussion   . Hypoglycemia 06/27/2016  . Seizure disorder (HCC) 06/27/2016  . Blindness 06/27/2016  . Chronic pain 06/27/2016  . History of CVA (cerebrovascular accident) 06/27/2016  . Generalized weakness 03/04/2016  . Hypothyroidism 03/04/2016  . GERD (gastroesophageal reflux disease) 03/04/2016  . End stage renal disease on dialysis (HCC) 03/04/2016  . Bipolar 1 disorder, mixed, moderate (HCC) 02/26/2016  . 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  Anemia 02/25/2016    Past Surgical History:  Procedure Laterality Date  . BRAIN SURGERY  1976  . BREAST LUMPECTOMY  2001  . CARDIAC CATHETERIZATION Right 10/03/2016   Procedure: Coronary Angiography;  Surgeon: Laurier NancyShaukat A Khan, MD;  [facility];  Service: Cardiovascular;  Laterality: Right;  . CHOLECYSTECTOMY  2001  . DG AV DIALYSIS GRAFT DECLOT OR     X 4  . GIVENS CAPSULE STUDY N/A 02/29/2016   Procedure: GIVENS CAPSULE STUDY;  Surgeon: Elnita MaxwellMatthew Gordon Rein, MD;  [facility];  Service: Endoscopy;  Laterality: N/A;  . PERIPHERAL VASCULAR CATHETERIZATION N/A 08/21/2015   Procedure: Dialysis/Perma Catheter Insertion;  Surgeon: [name], MD;  Location: [facility] INVASIVE CV LAB;  Service: Cardiovascular;  Laterality: N/A;  . PERIPHERAL VASCULAR CATHETERIZATION N/A 02/29/2016   Procedure: Dialysis/Perma Catheter Removal;  Surgeon: [name], MD;  Location: [facility] INVASIVE CV LAB;  Service: Cardiovascular;  Laterality: N/A;  . PITUITARY EXCISION  1976    Prior to Admission medications   Medication Sig Start Date End Date Taking? Authorizing Provider  estradiol (ESTRACE) 0.1 MG/GM vaginal cream Place 1 Applicatorful vaginally 2 (two) times a week. Mondays and Thursdays    Historical Provider, MD  fenofibrate (TRICOR) 145 MG tablet Take 145 mg by mouth every other day.    Historical Provider, MD  fludrocortisone (FLORINEF) 0.1 MG tablet Take 0.1 mg by mouth daily.    Historical Provider, MD  furosemide (LASIX) 40 MG tablet  Take 40 mg by mouth 2 (two) times daily.    Historical Provider, MD  gabapentin (NEURONTIN) 100 MG capsule Take 400 mg by mouth at bedtime.    Historical Provider, MD  hydrocortisone (CORTEF) 10 MG tablet Take 5-10 mg by mouth 2 (two) times daily. Pt takes 10 mg in the morning and 5 mg at bedtime.    Historical Provider, MD  HYDROmorphone (DILAUDID) 4 MG tablet Take 1 tablet (4 mg total) by mouth every 4 (four) hours as needed for severe pain.  10/04/16   Wyatt Haste, MD  levothyroxine (SYNTHROID, LEVOTHROID) 112 MCG tablet Take 112 mcg by mouth daily.     Historical Provider, MD  loratadine (CLARITIN) 10 MG tablet Take 10 mg by mouth daily.    Historical Provider, MD  lovastatin (MEVACOR) 40 MG tablet Take 20 mg by mouth every evening.     Historical Provider, MD  Melatonin 5 MG TABS Take 2 tablets by mouth at bedtime.    Historical Provider, MD  methylphenidate (RITALIN LA) 30 MG 24 hr capsule Take 30 mg by mouth every morning.    Historical Provider, MD  nystatin ointment (MYCOSTATIN) Apply 1 application topically 2 (two) times daily. Apply to affected area (groin, abdominal fold). 06/30/16   Standley Brooking, MD  ondansetron (ZOFRAN) 4 MG tablet Take 1 tablet (4 mg total) by mouth every 8 (eight) hours as needed for nausea or vomiting. 10/15/16   Nita Sickle, MD  oxyCODONE-acetaminophen (ROXICET) 5-325 MG tablet Take 1 tablet by mouth every 6 (six) hours as needed. 10/15/16 10/15/17  Nita Sickle, MD  pantoprazole (PROTONIX) 40 MG tablet Take 1 tablet (40 mg total) by mouth daily. 02/27/16   [name], MD  risperidone (RISPERDAL) 4 MG tablet Take 2-4 mg by mouth 2 (two) times daily. Pt takes 4 mg in the morning and 2 mg at bedtime.    Historical Provider, MD  sertraline (ZOLOFT) 50 MG tablet Take 50 mg by mouth daily.    Historical Provider, MD  sevelamer carbonate (RENVELA) 800 MG tablet Take 2,400 mg by mouth 2 (two) times daily.     Historical Provider, MD  topiramate (TOPAMAX) 200 MG tablet Take 200 mg by mouth 2 (two) times daily. Pt takes with a 50 mg tablet.    Historical Provider, MD  topiramate (TOPAMAX) 50 MG tablet Take 50 mg by mouth 2 (two) times daily. Pt takes with a 200 mg tablet.    Historical Provider, MD  traZODone (DESYREL) 100 MG tablet Take 100 mg by mouth at bedtime.    Historical Provider, MD  Vitamin D, Ergocalciferol, (DRISDOL) 50000 UNITS CAPS capsule Take 50,000 Units by mouth every 30 (thirty)  days. Takes on the 8th of every month    Historical Provider, MD    Allergies Dhea [nutritional supplements]; Demerol [meperidine]; Floxin [ofloxacin]; Nsaids; Nubain [nalbuphine hcl]; Phenergan [promethazine hcl]; Stadol [butorphanol]; and Erythromycin  Family History  Problem Relation Age of Onset  . Heart attack Mother   . Migraines Mother   . Aneurysm Mother   . Diabetes Father     Social History Social History  Substance Use Topics  . Smoking status: Current Every Day Smoker    Packs/day: 0.50    Years: 16.00    Types: Cigarettes  . Smokeless tobacco: Never Used  . Alcohol use No     Comment: occasionally    Review of Systems Constitutional: No fever/chills Eyes: Patient legally blind ENT: No sore throat. Cardiovascular: Denies chest  pain. Respiratory: Denies shortness of breath. Gastrointestinal: No abdominal pain.  No nausea, no vomiting.  No diarrhea.  No constipation. Genitourinary: Negative for dysuria. Musculoskeletal: back pain. Skin: Negative for rash.  10-point ROS otherwise negative.  ____________________________________________   PHYSICAL EXAM:  VITAL SIGNS: ED Triage Vitals  Enc Vitals Group     BP 10/26/16 1614 (!) 145/81     Pulse Rate 10/26/16 1614 65     Resp 10/26/16 1614 18     Temp 10/26/16 1614 98.2 F (36.8 C)     Temp Source 10/26/16 1614 Oral     SpO2 10/26/16 1614 96 %     Weight 10/26/16 1615 151 lb (68.5 kg)     Height 10/26/16 1615 4\' 10"  (1.473 m)     Head Circumference --      Peak Flow --      Pain Score 10/26/16 1615 10     Pain Loc --      Pain Edu? --      Excl. in GC? --     Constitutional: Alert and oriented. Well appearing and in no acute distress. Eyes: Conjunctivae are normal. PERRL. EOMI. Head: Atraumatic. Nose: No congestion/rhinnorhea. Mouth/Throat: Mucous membranes are moist.  Oropharynx non-erythematous. Neck: No stridor.  No cervical spine tenderness to palpation. Cardiovascular: Normal rate,  regular rhythm. Grossly normal heart sounds.  Good peripheral circulation. Respiratory: Normal respiratory effort.  No retractions. Lungs CTAB. Gastrointestinal: Soft and nontender. No distention. No abdominal bruits. No CVA tenderness. Musculoskeletal: No lower extremity tenderness nor edema.  No joint effusions. There is pain on palpation of the right hip and groin. The left shoulder is tender and there is a bruise anteriorly and the L-spine about L3-4 is tender to palpation as well. There is no paraspinous tenderness. Neurologic:  Normal speech and language. No gross focal neurologic deficits are appreciated. No gait instability. Skin:  Skin is warm, dry and intact. No rash noted. Psychiatric: Mood and affect are normal. Speech and behavior are normal.  ____________________________________________   LABS (all labs ordered are listed, but only abnormal results are displayed)  Labs Reviewed  CBC WITH DIFFERENTIAL/PLATELET - Abnormal; Notable for the following:       Result Value   RBC 3.52 (*)    Hemoglobin 10.9 (*)    HCT 33.1 (*)    RDW 20.1 (*)    Basophils Absolute 0.3 (*)    All other components within normal limits  COMPREHENSIVE METABOLIC PANEL - Abnormal; Notable for the following:    Sodium 133 (*)    Potassium 5.5 (*)    CO2 19 (*)    BUN 38 (*)    Creatinine, Ser 7.42 (*)    Calcium 8.0 (*)    Total Protein 5.8 (*)    Albumin 3.2 (*)    ALT 8 (*)    Alkaline Phosphatase 218 (*)    GFR calc non Af Amer 6 (*)    GFR calc Af Amer 7 (*)    All other components within normal limits   ____________________________________________  EKG   ____________________________________________  RADIOLOGY Study Result   CLINICAL DATA:  Lower back pain after fall last night.  EXAM: LUMBAR SPINE - COMPLETE 4+ VIEW  COMPARISON:  CT scan of July 04, 2016.  FINDINGS: There is no evidence of lumbar spine fracture. Alignment is normal. Intervertebral disc spaces are  maintained.  IMPRESSION: Normal lumbar spine.   Electronically Signed   By: Lupita Raider, M.D.  On: 10/26/2016 18:57    Study Result   CLINICAL DATA:  Patient fell last night and complains of left shoulder pain.  EXAM: LEFT SHOULDER - 2+ VIEW  COMPARISON:  07/09/2014  FINDINGS: Bones are diffusely demineralized. No evidence for an acute fracture. No evidence for shoulder dislocation or separation. Vascular stents noted proximal left all arm with apparent graft in situ.  IMPRESSION: Negative.   Electronically Signed   By: Kennith Center M.D.   On: 10/26/2016 18:54    Study Result   CLINICAL DATA:  Status post fall, with right hip pain. Initial encounter.  EXAM: DG HIP (WITH OR WITHOUT PELVIS) 2-3V RIGHT  COMPARISON:  Right hip radiographs performed 05/09/2016  FINDINGS: There is no evidence of fracture or dislocation. There is mildly increased uncovering of the right femoral head, suggestive of an underlying right hip joint effusion. Slight cortical irregularity is noted along the right femoral head, with mild chronic flattening.  The left hip joint is unremarkable in appearance. The sacroiliac joints are unremarkable in appearance.  The visualized bowel gas pattern is grossly unremarkable in appearance. A vascular stent is noted overlying the proximal left thigh.  IMPRESSION: 1. No evidence of fracture or dislocation. 2. Mildly increased uncovering of the right femoral head, suggestive of an underlying right hip joint effusion. Slight cortical irregularity along the right femoral head, with mild chronic flattening.   Electronically Signed   By: Roanna Raider M.D.   On: 10/26/2016 18:56     ____________________________________________   PROCEDURES  Procedure(s) performed:   Procedures  Critical Care performed:   ____________________________________________   INITIAL IMPRESSION / ASSESSMENT AND PLAN / ED  COURSE  Pertinent labs & imaging results that were available during my care of the patient were reviewed by me and considered in my medical decision making (see chart for details).    Clinical Course     Patient is still in considerable pain. Does not think she can walk indeed she has a lot of pain just trying to sit up. We will try to keep her overnight and give her more pain medicine helped the pain will be controlled shortly after dialysis tomorrow. ____________________________________________   FINAL CLINICAL IMPRESSION(S) / ED DIAGNOSES  Final diagnoses:  Fall, initial encounter  Multiple contusions  Pain      NEW MEDICATIONS STARTED DURING THIS VISIT:  New Prescriptions   No medications on file     Note:  This document was prepared using Dragon voice recognition software and may include unintentional dictation errors.    Arnaldo Natal, MD 10/26/16 [phone]

## 2016-10-27 DIAGNOSIS — T82868A Thrombosis of vascular prosthetic devices, implants and grafts, initial encounter: Secondary | ICD-10-CM | POA: Diagnosis not present

## 2016-10-27 LAB — BASIC METABOLIC PANEL
Anion gap: 8 (ref 5–15)
BUN: 38 mg/dL — ABNORMAL HIGH (ref 6–20)
CALCIUM: 8.1 mg/dL — AB (ref 8.9–10.3)
CHLORIDE: 102 mmol/L (ref 101–111)
CO2: 20 mmol/L — AB (ref 22–32)
CREATININE: 7.21 mg/dL — AB (ref 0.44–1.00)
GFR calc Af Amer: 7 mL/min — ABNORMAL LOW (ref >60)
GFR calc non Af Amer: 6 mL/min — ABNORMAL LOW (ref >60)
GLUCOSE: 83 mg/dL (ref 65–99)
Potassium: 6.3 mmol/L (ref 3.5–5.1)
Sodium: 130 mmol/L — ABNORMAL LOW (ref 135–145)

## 2016-10-27 LAB — CBC
HCT: 33.1 % — ABNORMAL LOW (ref 35.0–47.0)
Hemoglobin: 11 g/dL — ABNORMAL LOW (ref 12.0–16.0)
MCH: 31.6 pg (ref 26.0–34.0)
MCHC: 33.3 g/dL (ref 32.0–36.0)
MCV: 95.1 fL (ref 80.0–100.0)
Platelets: 177 10*3/uL (ref 150–440)
RBC: 3.49 MIL/uL — ABNORMAL LOW (ref 3.80–5.20)
RDW: 19.5 % — ABNORMAL HIGH (ref 11.5–14.5)
WBC: 6 10*3/uL (ref 3.6–11.0)

## 2016-10-27 MED ORDER — HYDROMORPHONE HCL 2 MG PO TABS
1.0000 mg | ORAL_TABLET | Freq: Four times a day (QID) | ORAL | Status: DC | PRN
  Administered 2016-10-27 (×2): 1 mg via ORAL
  Filled 2016-10-27 (×2): qty 1

## 2016-10-27 MED ORDER — SODIUM POLYSTYRENE SULFONATE 15 GM/60ML PO SUSP
60.0000 g | Freq: Once | ORAL | Status: AC
  Administered 2016-10-27: 60 g via ORAL
  Filled 2016-10-27: qty 240

## 2016-10-27 NOTE — Progress Notes (Signed)
[facility] [place] [facility]   PATIENT NAME: Erin [name]    MR#:  [id number]  DATE OF BIRTH:  06/23/1971  SUBJECTIVE:   c/oShoulder pain status post fall. No fracture. wants Dilaudid for pain. REVIEW OF SYSTEMS:   Review of Systems  Constitutional: Negative for chills, fever and weight loss.  HENT: Negative for ear discharge, ear pain and nosebleeds.   Eyes: Negative for blurred vision, pain and discharge.  Respiratory: Negative for sputum production, shortness of breath, wheezing and stridor.   Cardiovascular: Negative for chest pain, palpitations, orthopnea and PND.  Gastrointestinal: Negative for abdominal pain, diarrhea, nausea and vomiting.  Genitourinary: Negative for frequency and urgency.  Musculoskeletal: Positive for joint pain. Negative for back pain.  Neurological: Positive for weakness. Negative for sensory change, speech change and focal weakness.  Psychiatric/Behavioral: Negative for depression and hallucinations. The patient is not nervous/anxious.    Tolerating Diet:yes Tolerating PT:   DRUG ALLERGIES:   Allergies  Allergen Reactions  . Dhea [Nutritional Supplements] Anaphylaxis    Patient states medication is DHE for migraines, not DHEA  . Demerol [Meperidine] Other (See Comments)    Reaction:  Hallucinations   . Floxin [Ofloxacin] Other (See Comments)    Reaction:  Hallucinations   . Nsaids Nausea And Vomiting and Swelling  . Nubain [Nalbuphine Hcl] Other (See Comments)    Reaction:  Hallucinations   . Phenergan [Promethazine Hcl] Other (See Comments)    Reaction:  Restless legs   . Stadol [Butorphanol] Other (See Comments)    Reaction:  Hallucinations   . Erythromycin Diarrhea and Rash    VITALS:  Blood pressure 125/60, pulse (!) 56, temperature 97.7 F (36.5 C), temperature source Oral, resp. rate 13, height 4\' 10"  (1.473 m), weight 65 kg (143 lb 4.8 oz), last menstrual period 11/21/2007, SpO2 100 %.  PHYSICAL  EXAMINATION:   Physical Exam  GENERAL:  45 y.o.-year-old patient lying in the bed with no acute distress.  EYES: Pupils equal, round, reactive to light and accommodation. No scleral icterus. Extraocular muscles intact.  HEENT: Head atraumatic, normocephalic. Oropharynx and nasopharynx clear. Patient is legally blind NECK:  Supple, no jugular venous distention. No thyroid enlargement, no tenderness.  LUNGS: Normal breath sounds bilaterally, no wheezing, rales, rhonchi. No use of accessory muscles of respiration.  CARDIOVASCULAR: S1, S2 normal. No murmurs, rubs, or gallops.  ABDOMEN: Soft, nontender, nondistended. Bowel sounds present. No organomegaly or mass.  EXTREMITIES: No cyanosis, clubbing or edema b/l.    NEUROLOGIC: Cranial nerves II through XII are intact. No focal Motor or sensory deficits b/l.   PSYCHIATRIC:  patient is alert and oriented x 3.  SKIN: No obvious rash, lesion, or ulcer.   LABORATORY PANEL:  CBC  Recent Labs Lab 10/27/16 0506  WBC 6.0  HGB 11.0*  HCT 33.1*  PLT 177    Chemistries   Recent Labs Lab 10/26/16 1651 10/27/16 0506  NA 133* 130*  K 5.5* 6.3*  CL 105 102  CO2 19* 20*  GLUCOSE 71 83  BUN 38* 38*  CREATININE 7.42* 7.21*  CALCIUM 8.0* 8.1*  AST 29  --   ALT 8*  --   ALKPHOS 218*  --   BILITOT 0.4  --    Cardiac Enzymes  Recent Labs Lab 10/23/16 2112  TROPONINI <0.03   RADIOLOGY:  Dg Lumbar Spine Complete  Result Date: 10/26/2016 CLINICAL DATA:  Lower back pain after fall last night. EXAM: LUMBAR SPINE - COMPLETE 4+ VIEW  COMPARISON:  CT scan of July 04, 2016. FINDINGS: There is no evidence of lumbar spine fracture. Alignment is normal. Intervertebral disc spaces are maintained. IMPRESSION: Normal lumbar spine. Electronically Signed   By: Lupita Raider, M.D.   On: 10/26/2016 18:57   Dg Chest Portable 1 View  Result Date: 10/26/2016 CLINICAL DATA:  Status post fall, with left shoulder pain. Initial encounter. EXAM:  PORTABLE CHEST 1 VIEW COMPARISON:  Chest radiograph performed 10/23/2016 FINDINGS: The lungs are well-aerated and clear. There is no evidence of focal opacification, pleural effusion or pneumothorax. The cardiomediastinal silhouette is borderline normal in size. No acute osseous abnormalities are seen. IMPRESSION: No acute cardiopulmonary process seen. Electronically Signed   By: Roanna Raider M.D.   On: 10/26/2016 19:26   Dg Shoulder Left  Result Date: 10/26/2016 CLINICAL DATA:  Patient fell last night and complains of left shoulder pain. EXAM: LEFT SHOULDER - 2+ VIEW COMPARISON:  07/09/2014 FINDINGS: Bones are diffusely demineralized. No evidence for an acute fracture. No evidence for shoulder dislocation or separation. Vascular stents noted proximal left all arm with apparent graft in situ. IMPRESSION: Negative. Electronically Signed   By: Kennith Center M.D.   On: 10/26/2016 18:54   Dg Hip Unilat W Or Wo Pelvis 2-3 Views Right  Result Date: 10/26/2016 CLINICAL DATA:  Status post fall, with right hip pain. Initial encounter. EXAM: DG HIP (WITH OR WITHOUT PELVIS) 2-3V RIGHT COMPARISON:  Right hip radiographs performed 05/09/2016 FINDINGS: There is no evidence of fracture or dislocation. There is mildly increased uncovering of the right femoral head, suggestive of an underlying right hip joint effusion. Slight cortical irregularity is noted along the right femoral head, with mild chronic flattening. The left hip joint is unremarkable in appearance. The sacroiliac joints are unremarkable in appearance. The visualized bowel gas pattern is grossly unremarkable in appearance. A vascular stent is noted overlying the proximal left thigh. IMPRESSION: 1. No evidence of fracture or dislocation. 2. Mildly increased uncovering of the right femoral head, suggestive of an underlying right hip joint effusion. Slight cortical irregularity along the right femoral head, with mild chronic flattening. Electronically Signed    By: Roanna Raider M.D.   On: 10/26/2016 18:56   ASSESSMENT AND PLAN:  45 y.o. female with a history of end-stage renal disease on hemodialysis, legally blind, CVA, DVT, hyperlipidemia now being admitted with: 1. Intractable pain status post mechanical fall-admit to observation for pain control, PT evaluation.  -When necessary by mouth Dilaudid   2. History of hyperlipidemia-continue TriCor and Mevacor  3. History of end-stage renal disease on hemodialysis-continue Florinef, hydrocortisone, Renvela, vitamin D Patient has had some issues with her fistula. She is scheduled for fistulogram tomorrow. -Hyperkalemia treated with Kayexalate By nephrology  4. History of neuropathy and chronic pain-continue Neurontin, Topamax  5. History of hypothyroid-continue Synthroid  6. History of allergies-continue Claritin  6. History of GERD-continue Protonix  7. History of psychosis, anxiety and depression-continue risperidone, Ritalin, Zoloft, trazodone  Case discussed with Care Management/Social Worker. Management plans discussed with the patient, family and they are in agreement.  CODE STATUS: full  DVT Prophylaxis: heparin TOTAL TIME TAKING CARE OF THIS PATIENT:25 minutes.  >50% time spent on counselling and coordination of care  POSSIBLE D/C IN 1-2 DAYS, DEPENDING ON CLINICAL CONDITION.  Note: This dictation was prepared with Dragon dictation along with smaller phrase technology. Any transcriptional errors that result from this process are unintentional.  Maram Bently M.D on 10/27/2016 at 1:06 PM  Between 7am  to 6pm - Pager - [phone]  After 6pm go to [email or web address] - password [email or web address] [facility]  [place] [place] [facility]  Office  [phone]  CC: Primary care physician; [facility]

## 2016-10-27 NOTE — Progress Notes (Signed)
Pre hd assessment  

## 2016-10-27 NOTE — Care Management (Signed)
Patient admitted for intractable pain.  Patient history of blindness.  Patient states that she lives at home alone.  States that she does not have any family support.  Patient states that she has a talking glucometer in the home.  Patient utilizes medicaid transportation for HD, and [employer] for transportation to doctor appointments and errands.  Pharmacy [facility].  PCP [facility].  PT consult pending.  RNCM following for discharge needs

## 2016-10-27 NOTE — Progress Notes (Signed)
PT Cancellation Note  Patient Details Name: Erin [name] MRN: [id number] DOB: 1971/03/16   Cancelled Treatment:    Reason Eval/Treat Not Completed: Medical issues which prohibited therapy; Pt's Ka level currently 6.3 mmol/L exceeding upper limit per [facility] guidelines.  Will attempt to see pt at a future date as appropriate.     Thomes Dinning Jrake Rodriquez 10/27/2016, 3:12 PM

## 2016-10-27 NOTE — Progress Notes (Signed)
Hd info

## 2016-10-27 NOTE — Progress Notes (Signed)
Patient complaining of pain. Refused morphine. States" it makes my arms itch" Requesting IV dilaudid. MD notified. Acknowledged. No new orders placed. Verbal ok to give po pain medication 1 hour early. Will monitor

## 2016-10-27 NOTE — Progress Notes (Signed)
Patient returned from specials.  They will not be performing fistulagram today.  It will be tomorrow 10/28/16.

## 2016-10-27 NOTE — Progress Notes (Signed)
[facility]  ROUNDING NOTE   Subjective:   Erin [name] was admitted to [facility] on 10/26/2016 for Pain [R52] Multiple contusions [T07.XXXA] Fall, initial encounter [id number][W19.XXXA]  Patient brought down to dialysis for treatment but venous lines brought out clots. Will consult vascular surgery before proceeding with dialysis.   Objective:  Vital signs in last 24 hours:  Temp:  [97.4 F (36.3 C)-98.2 F (36.8 C)] 97.9 F (36.6 C) (12/28 1033) Pulse Rate:  [52-74] 52 (12/28 1033) Resp:  [13-19] 13 (12/28 1033) BP: (104-153)/(59-81) 125/78 (12/28 1033) SpO2:  [96 %-100 %] 100 % (12/28 1033) Weight:  [65 kg (143 lb 4.8 oz)-68.5 kg (151 lb)] 65 kg (143 lb 4.8 oz) (12/28 1033)  Weight change:  Filed Weights   10/26/16 1615 10/27/16 1033  Weight: 68.5 kg (151 lb) 65 kg (143 lb 4.8 oz)    Intake/Output: No intake/output data recorded.   Intake/Output this shift:  Total I/O In: 120 [P.O.:120] Out: 150 [Urine:150]  Physical Exam: General: NAD  Head: Normocephalic, atraumatic. Moist oral mucosal membranes  Eyes: Anicteric, PERRL  Neck: Supple, trachea midline  Lungs:  Clear to auscultation  Heart: Regular rate and rhythm  Abdomen:  Soft, nontender,   Extremities: + peripheral edema.  Neurologic: Nonfocal, moving all four extremities  Skin: No lesions  Access: Right arm AVF    Basic Metabolic Panel:  Recent Labs Lab 10/23/16 2112 10/26/16 1651 10/27/16 0506  NA 132* 133* 130*  K 4.6 5.5* 6.3*  CL 101 105 102  CO2 24 19* 20*  GLUCOSE 86 71 83  BUN 30* 38* 38*  CREATININE 5.25* 7.42* 7.21*  CALCIUM 8.1* 8.0* 8.1*    Liver Function Tests:  Recent Labs Lab 10/26/16 1651  AST 29  ALT 8*  ALKPHOS 218*  BILITOT 0.4  PROT 5.8*  ALBUMIN 3.2*    Recent Labs Lab 10/23/16 2112  LIPASE 45   No results for input(s): AMMONIA in the last 168 hours.  CBC:  Recent Labs Lab 10/23/16 2112 10/26/16 1651 10/27/16 0506  WBC 7.6 7.1 6.0   NEUTROABS  --  4.0  --   HGB 10.7* 10.9* 11.0*  HCT 32.2* 33.1* 33.1*  MCV 93.9 94.1 95.1  PLT 183 197 177    Cardiac Enzymes:  Recent Labs Lab 10/23/16 2112  TROPONINI <0.03    BNP: Invalid input(s): POCBNP  CBG: No results for input(s): GLUCAP in the last 168 hours.  Microbiology: Results for orders placed or performed during the hospital encounter of 10/02/16  MRSA PCR Screening     Status: None   Collection Time: 10/02/16  6:33 AM  Result Value Ref Range Status   MRSA by PCR NEGATIVE NEGATIVE Final    Comment:        The GeneXpert MRSA Assay (FDA approved for NASAL specimens only), is one component of a comprehensive MRSA colonization surveillance program. It is not intended to diagnose MRSA infection nor to guide or monitor treatment for MRSA infections.     Coagulation Studies: No results for input(s): LABPROT, INR in the last 72 hours.  Urinalysis: No results for input(s): COLORURINE, LABSPEC, PHURINE, GLUCOSEU, HGBUR, BILIRUBINUR, KETONESUR, PROTEINUR, UROBILINOGEN, NITRITE, LEUKOCYTESUR in the last 72 hours.  Invalid input(s): APPERANCEUR    Imaging: Dg Lumbar Spine Complete  Result Date: 10/26/2016 CLINICAL DATA:  Lower back pain after fall last night. EXAM: LUMBAR SPINE - COMPLETE 4+ VIEW COMPARISON:  CT scan of July 04, 2016. FINDINGS: There is no evidence  of lumbar spine fracture. Alignment is normal. Intervertebral disc spaces are maintained. IMPRESSION: Normal lumbar spine. Electronically Signed   By: Lupita Raider, M.D.   On: 10/26/2016 18:57   Dg Chest Portable 1 View  Result Date: 10/26/2016 CLINICAL DATA:  Status post fall, with left shoulder pain. Initial encounter. EXAM: PORTABLE CHEST 1 VIEW COMPARISON:  Chest radiograph performed 10/23/2016 FINDINGS: The lungs are well-aerated and clear. There is no evidence of focal opacification, pleural effusion or pneumothorax. The cardiomediastinal silhouette is borderline normal in size.  No acute osseous abnormalities are seen. IMPRESSION: No acute cardiopulmonary process seen. Electronically Signed   By: Roanna Raider M.D.   On: 10/26/2016 19:26   Dg Shoulder Left  Result Date: 10/26/2016 CLINICAL DATA:  Patient fell last night and complains of left shoulder pain. EXAM: LEFT SHOULDER - 2+ VIEW COMPARISON:  07/09/2014 FINDINGS: Bones are diffusely demineralized. No evidence for an acute fracture. No evidence for shoulder dislocation or separation. Vascular stents noted proximal left all arm with apparent graft in situ. IMPRESSION: Negative. Electronically Signed   By: Kennith Center M.D.   On: 10/26/2016 18:54   Dg Hip Unilat W Or Wo Pelvis 2-3 Views Right  Result Date: 10/26/2016 CLINICAL DATA:  Status post fall, with right hip pain. Initial encounter. EXAM: DG HIP (WITH OR WITHOUT PELVIS) 2-3V RIGHT COMPARISON:  Right hip radiographs performed 05/09/2016 FINDINGS: There is no evidence of fracture or dislocation. There is mildly increased uncovering of the right femoral head, suggestive of an underlying right hip joint effusion. Slight cortical irregularity is noted along the right femoral head, with mild chronic flattening. The left hip joint is unremarkable in appearance. The sacroiliac joints are unremarkable in appearance. The visualized bowel gas pattern is grossly unremarkable in appearance. A vascular stent is noted overlying the proximal left thigh. IMPRESSION: 1. No evidence of fracture or dislocation. 2. Mildly increased uncovering of the right femoral head, suggestive of an underlying right hip joint effusion. Slight cortical irregularity along the right femoral head, with mild chronic flattening. Electronically Signed   By: Roanna Raider M.D.   On: 10/26/2016 18:56     Medications:    . estradiol  1 Applicatorful Vaginal Once per day on Mon Thu  . fenofibrate  160 mg Oral Daily  . fludrocortisone  0.1 mg Oral Daily  . furosemide  40 mg Oral BID  . gabapentin  400  mg Oral QHS  . heparin subcutaneous  5,000 Units Subcutaneous Q8H  . hydrocortisone  10 mg Oral q morning - 10a  . hydrocortisone  5 mg Oral QHS  . levothyroxine  112 mcg Oral QAC breakfast  . loratadine  10 mg Oral Daily  . Melatonin  2 tablet Oral QHS  . pantoprazole  40 mg Oral Daily  . pravastatin  20 mg Oral q1800  . risperiDONE  2 mg Oral QHS  . risperiDONE  4 mg Oral q morning - 10a  . sertraline  50 mg Oral Daily  . sevelamer carbonate  2,400 mg Oral BID  . sodium polystyrene  60 g Oral Once  . topiramate  200 mg Oral BID  . topiramate  50 mg Oral BID  . traZODone  100 mg Oral QHS  . [START ON 11/07/2016] Vitamin D (Ergocalciferol)  50,000 Units Oral Q30 days   acetaminophen **OR** acetaminophen, albuterol, bisacodyl, diphenhydrAMINE, magnesium citrate, morphine injection, ondansetron **OR** ondansetron (ZOFRAN) IV, oxyCODONE-acetaminophen, senna-docusate  Assessment/ Plan:  Ms. ADALYNE HEMBREE is a  45 y.o. white female with End stage renal disease on hemodialysis, blindness, hypertension, diabetes mellitus type II, hyperlipidemia  TTS [facility]  1. End Stage Renal Disease with hyperkalemia and complication with dialysis device.  - Kayexalate ordered - Unable to get dialysis currently.  - Consult vascular for fistulogram - dialysis hopefully for later today  2. Hypotension: on hydrocortisone.   3. Anemia of chronic kidney disease: hemoglobin 11 - hold epo due to thrombosis  4. Secondary Hyperparathyroidism: outpatient PTH, phosphorus and calcium at goal - Continue sevelamer with meals.    LOS: 0 Meral Geissinger 12/28/201710:39 AM

## 2016-10-27 NOTE — Progress Notes (Signed)
Unable to successfully cannulate venous side of graft. Pt states outpatient clinic has had several issues with clots. C/o of pain and numbness towards venous end of graft. Clots pulled on both attempts to cannulate venous. No issues with arterial. Kolluru MD present and aware. Pt. To have access declotted today.

## 2016-10-27 NOTE — Progress Notes (Signed)
Patient currently in dialysis.  Transported by orderly in bed.

## 2016-10-27 NOTE — Progress Notes (Signed)
Initial Nutrition Assessment  DOCUMENTATION CODES:   Not applicable  INTERVENTION:   Snacks- pt does not like supplements   NUTRITION DIAGNOSIS:   Inadequate oral intake related to poor appetite, nausea as evidenced by per patient/family report, meal completion < 50%, 12 percent weight loss in 4 months.  GOAL:   Patient will meet greater than or equal to 90% of their needs  MONITOR:   PO intake, Other (Comment) (snacks)  REASON FOR ASSESSMENT:   Malnutrition Screening Tool    ASSESSMENT:   45 y.o.femalewith a history of end-stage renal disease on hemodialysis, legally blind, CVA, DVT, hyperlipidemianow being admitted with mechanical fall and pain management.    Met with pt in room today. Pt reports poor appetite and oral intake for two weeks pta. Pt currently eating <50% meals. RD noted pts lunch tray which was <25% eaten today. Per chart, pt has lost 19lbs(12%) in 4 months. This is severe. Pt scheduled to have fistulagram 12/29. Pt does not like any supplements. Will order snacks.   Medications reviewed and include: lasix, heparin, synthroid, melatonin, renvela, protonix, vit D, morphine, oxycodone   Labs reviewed: Na 130(L), K 6.3(H), Co2 20(L), BUN 38(H), creat 7.21(H), Ca 8.1(L) adj. 8.74(L), Alk Phos 218(H), Alb 3.2(L)  Nutrition-Focused physical exam completed. Findings are no fat depletion, no muscle depletion, and moderate edema.   Diet Order:  Diet renal with fluid restriction Fluid restriction: 1200 mL Fluid; Room service appropriate? Yes; Fluid consistency: Thin  Skin:  Reviewed, no issues  Last BM:  12/27  Height:   Ht Readings from Last 1 Encounters:  10/26/16 _0  (1.473 m)    Weight:   Wt Readings from Last 1 Encounters:  10/27/16 143 lb 4.8 oz (65 kg)    Ideal Body Weight:  43.9 kg  BMI:  Body mass index is 29.95 kg/m.  Estimated Nutritional Needs:   Kcal:  1300-1600kcal/day   Protein:  78-91g/day   Fluid:  >1.3L/day    EDUCATION NEEDS:   No education needs identified at this time  Koleen Distance, RD, LDN Pager #[phone]

## 2016-10-27 NOTE — Care Management (Signed)
HD information faxed to [facility] liaison

## 2016-10-27 NOTE — Progress Notes (Signed)
Pt stated that she does not want to take evening dose of Lasix. Primary nurse to continue to monitor.

## 2016-10-27 NOTE — Care Management Obs Status (Signed)
MEDICARE OBSERVATION STATUS NOTIFICATION   Patient Details  Name: Erin [name] MRN: [id number] Date of Birth: Jul 24, 1971   Medicare Observation Status Notification Given:  Yes    Chapman Fitch, RN 10/27/2016, 3:55 PM

## 2016-10-28 ENCOUNTER — Encounter
Admission: EM | Disposition: A | Discharge status: Home / Self Care | Payer: Self-pay | Source: Home / Self Care | Attending: Emergency Medicine

## 2016-10-28 DIAGNOSIS — T82868A Thrombosis of vascular prosthetic devices, implants and grafts, initial encounter: Secondary | ICD-10-CM | POA: Diagnosis not present

## 2016-10-28 DIAGNOSIS — N186 End stage renal disease: Secondary | ICD-10-CM | POA: Diagnosis not present

## 2016-10-28 HISTORY — PX: PERIPHERAL VASCULAR CATHETERIZATION: SHX172C

## 2016-10-28 LAB — SURGICAL PCR SCREEN
MRSA, PCR: NEGATIVE
Staphylococcus aureus: POSITIVE — AB

## 2016-10-28 LAB — BASIC METABOLIC PANEL
Anion gap: 9 (ref 5–15)
BUN: 42 mg/dL — AB (ref 6–20)
CHLORIDE: 106 mmol/L (ref 101–111)
CO2: 19 mmol/L — AB (ref 22–32)
Calcium: 7.2 mg/dL — ABNORMAL LOW (ref 8.9–10.3)
Creatinine, Ser: 7.84 mg/dL — ABNORMAL HIGH (ref 0.44–1.00)
GFR calc Af Amer: 7 mL/min — ABNORMAL LOW (ref >60)
GFR calc non Af Amer: 6 mL/min — ABNORMAL LOW (ref >60)
GLUCOSE: 82 mg/dL (ref 65–99)
POTASSIUM: 5.8 mmol/L — AB (ref 3.5–5.1)
Sodium: 134 mmol/L — ABNORMAL LOW (ref 135–145)

## 2016-10-28 SURGERY — A/V SHUNT INTERVENTION
Anesthesia: Moderate Sedation

## 2016-10-28 SURGERY — A/V FISTULAGRAM
Anesthesia: Moderate Sedation

## 2016-10-28 MED ORDER — MUPIROCIN 2 % EX OINT
1.0000 "application " | TOPICAL_OINTMENT | Freq: Two times a day (BID) | CUTANEOUS | Status: DC
  Administered 2016-10-28 – 2016-10-29 (×2): 1 via NASAL
  Filled 2016-10-28 (×2): qty 22

## 2016-10-28 MED ORDER — HEPARIN SODIUM (PORCINE) 1000 UNIT/ML IJ SOLN
INTRAMUSCULAR | Status: DC | PRN
  Administered 2016-10-28: 4000 [IU] via INTRAVENOUS

## 2016-10-28 MED ORDER — FENTANYL CITRATE (PF) 100 MCG/2ML IJ SOLN
INTRAMUSCULAR | Status: DC | PRN
  Administered 2016-10-28 (×2): 50 ug via INTRAVENOUS

## 2016-10-28 MED ORDER — SODIUM POLYSTYRENE SULFONATE 15 GM/60ML PO SUSP
60.0000 g | Freq: Once | ORAL | Status: AC
  Administered 2016-10-28: 60 g via ORAL
  Filled 2016-10-28: qty 240

## 2016-10-28 MED ORDER — HEPARIN SODIUM (PORCINE) 1000 UNIT/ML IJ SOLN
INTRAMUSCULAR | Status: AC
  Filled 2016-10-28: qty 1

## 2016-10-28 MED ORDER — HEPARIN (PORCINE) IN NACL 2-0.9 UNIT/ML-% IJ SOLN
INTRAMUSCULAR | Status: AC
  Filled 2016-10-28: qty 500

## 2016-10-28 MED ORDER — CHLORHEXIDINE GLUCONATE CLOTH 2 % EX PADS
6.0000 | MEDICATED_PAD | Freq: Every day | CUTANEOUS | Status: DC
  Administered 2016-10-28 – 2016-10-29 (×2): 6 via TOPICAL

## 2016-10-28 MED ORDER — DEXTROSE 5 % IV SOLN
1000.0000 mg | Freq: Once | INTRAVENOUS | Status: AC
  Administered 2016-10-28: 1000 mg via INTRAVENOUS

## 2016-10-28 MED ORDER — MIDAZOLAM HCL 2 MG/2ML IJ SOLN
INTRAMUSCULAR | Status: AC
  Filled 2016-10-28: qty 4

## 2016-10-28 MED ORDER — FENTANYL CITRATE (PF) 100 MCG/2ML IJ SOLN
INTRAMUSCULAR | Status: AC
  Filled 2016-10-28: qty 2

## 2016-10-28 MED ORDER — TRAMADOL HCL 50 MG PO TABS: 50.0000 mg | ORAL_TABLET | Freq: Four times a day (QID) | ORAL | Status: DC | PRN

## 2016-10-28 MED ORDER — MIDAZOLAM HCL 2 MG/2ML IJ SOLN
INTRAMUSCULAR | Status: DC | PRN
  Administered 2016-10-28 (×2): 1 mg via INTRAVENOUS

## 2016-10-28 MED ORDER — LIDOCAINE HCL (PF) 1 % IJ SOLN
INTRAMUSCULAR | Status: AC
  Filled 2016-10-28: qty 30

## 2016-10-28 MED ORDER — IOPAMIDOL (ISOVUE-300) INJECTION 61%
INTRAVENOUS | Status: DC | PRN
  Administered 2016-10-28: 40 mL via INTRA_ARTERIAL

## 2016-10-28 SURGICAL SUPPLY — 18 items
BALLN ARMADA 9X40X80 (BALLOONS) ×3
BALLN DORADO 6X40X80 (BALLOONS) ×3
BALLN DORADO 8X20X80 (BALLOONS) ×3
BALLOON ARMADA 9X40X80 (BALLOONS) ×1 IMPLANT
BALLOON DORADO 6X40X80 (BALLOONS) ×1 IMPLANT
BALLOON DORADO 8X20X80 (BALLOONS) ×1 IMPLANT
CATH TORCON 5FR 0.38 (CATHETERS) ×3 IMPLANT
DEVICE PRESTO INFLATION (MISCELLANEOUS) ×3 IMPLANT
DRAPE BRACHIAL (DRAPES) ×3 IMPLANT
GRAFT STENT FLAIR ENDOVAS 8X50 (Permanent Stent) ×3 IMPLANT
NEEDLE ENTRY 21GA 7CM ECHOTIP (NEEDLE) ×3 IMPLANT
PACK ANGIOGRAPHY (CUSTOM PROCEDURE TRAY) ×3 IMPLANT
SET INTRO CAPELLA COAXIAL (SET/KITS/TRAYS/PACK) ×3 IMPLANT
SHEATH BRITE TIP 6FRX5.5 (SHEATH) ×3 IMPLANT
SHEATH BRITE TIP 7FRX5.5 (SHEATH) ×3 IMPLANT
SHIELD RADPAD DADD DRAPE 4X9 (MISCELLANEOUS) ×3 IMPLANT
TOWEL OR 17X26 4PK STRL BLUE (TOWEL DISPOSABLE) ×3 IMPLANT
WIRE MAGIC TORQUE 260C (WIRE) ×3 IMPLANT

## 2016-10-28 NOTE — Progress Notes (Signed)
Report given to Clydie Braun at specials. Potassium was discussed. VS discussed. Receiving staff aware about  low heart rate as well.

## 2016-10-28 NOTE — Op Note (Signed)
OPERATIVE NOTE   PROCEDURE: 1. Contrast injection right arm brachial axillary  AV access 2. Percutaneous transluminal angioplasty and stent placement venous outflow right arm brachial axillary AV graft  PRE-OPERATIVE DIAGNOSIS: Complication of dialysis access                                                       End Stage Renal Disease  POST-OPERATIVE DIAGNOSIS: same as above   SURGEON: Renford Dills, M.D.  ANESTHESIA: Conscious sedation was administered under my direct supervision. IV Versed plus fentanyl were utilized. Continuous ECG, pulse oximetry and blood pressure was monitored throughout the entire procedure.  Conscious sedation was for a total of 30.  ESTIMATED BLOOD LOSS: minimal  FINDING(S): 1. String sign noted at the venous anastomosis  SPECIMEN(S):  None  CONTRAST: 40 cc  FLUOROSCOPY TIME: 4.1 minutes  INDICATIONS: Erin [name] is a 46 y.o. female who  presents with malfunctioning right arm brachial axillary AV access.  The patient is scheduled for angiography with possible intervention of the AV access.  The patient is aware the risks include but are not limited to: bleeding, infection, thrombosis of the cannulated access, and possible anaphylactic reaction to the contrast.  The patient acknowledges if the access can not be salvaged a tunneled catheter will be needed and will be placed during this procedure.  The patient is aware of the risks of the procedure and elects to proceed with the angiogram and intervention.  DESCRIPTION: After full informed written consent was obtained, the patient was brought back to the Special Procedure suite and placed supine position.  Appropriate cardiopulmonary monitors were placed.  The right arm was prepped and draped in the standard fashion.  Appropriate timeout is called. The right brachial axillary AV graft  was cannulated with a micropuncture needle.  Cannulation was performed with ultrasound guidance. Ultrasound was  placed in a sterile sleeve, the AV access was interrogated and noted to be echolucent and compressible indicating patency. Image was recorded for the permanent record. The puncture is performed under continuous ultrasound visualization.   The microwire was advanced and the needle was exchanged for  a microsheath.  The J-wire was then advanced and a 6 Fr sheath inserted.  Hand injections were completed to image the access from the arterial anastomosis through the entire access.  The central venous structures were also imaged by hand injections.  Based on the images,  3000 units of heparin was given and a wire was negotiated through the strictures within the venous portion of the graft.  An 8 mm x 50 mm flared Flair stent was deployed across the stenoses and postdilated with an 8 mm Dorado balloon and a 9 x 40 Armada balloon.  Follow-up imaging demonstrates complete resolution of the stricture with rapid flow of contrast through the graft, the central venous anatomy is preserved.  A 4-0 Monocryl purse-string suture was sewn around the sheath.  The sheath was removed and light pressure was applied.  A sterile bandage was applied to the puncture site.    COMPLICATIONS: None  CONDITION: Erin [name], M.D [facility] Office: [phone]  10/28/2016 2:29 PM

## 2016-10-28 NOTE — Discharge Summary (Deleted)
[facility] - [place] [facility]   PATIENT NAME: Erin CharSarah Charrette    MR#:  [id number]  DATE OF BIRTH:  12/19/1970  DATE OF ADMISSION:  10/26/2016 ADMITTING PHYSICIAN: Tonye RoyaltyAlexis Hugelmeyer, DO  DATE OF DISCHARGE: 10/28/16  PRIMARY CARE PHYSICIAN: [facility]    ADMISSION DIAGNOSIS:  Pain [R52] Multiple contusions [T07.XXXA] Fall, initial encounter [id number][W19.XXXA]  DISCHARGE DIAGNOSIS:  Mechanical fall without injury mild shoulder contusion on the right End-stage renal disease on hemodialysis HD access malfunction  Legally blind Chronic pain syndrome SECONDARY DIAGNOSIS:   Past Medical History:  Diagnosis Date  . Anxiety   . Asthma   . Bipolar affect, depressed (HCC)   . Blind   . Chronic headaches    Seeing Pain Management  . Constipation   . Depression   . Epilepsy (HCC)   . Fibromyalgia   . History of CVA (cerebrovascular accident)   . History of DVT (deep vein thrombosis)   . Hyperlipidemia   . Hypothyroid   . Kidney failure    Currently on Dialysis  . Narcolepsy   . 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 Psychosis     HOSPITAL COURSE:   45 y.o.femalewith a history of end-stage renal disease on hemodialysis, legally blind, CVA, DVT, hyperlipidemianow being admitted with: 1. Intractable pain status post mechanical fall-admit to observation for pain control -When necessary by mouth pain meds 2. History of hyperlipidemia-continue TriCor and Mevacor  3. History of end-stage renal disease on hemodialysis-continue Florinef, hydrocortisone, Renvela, vitamin D Patient has had some issues with her fistula. She is scheduled for fistulogram today. Patient will be dialyzed after that -Hyperkalemia treated with Kayexalate By nephrology  4. History of neuropathy and chronic pain-continue Neurontin, Topamax  5. History of hypothyroid-continue Synthroid  6. History of allergies-continue  Claritin  6. History of GERD-continue Protonix  7. History of psychosis, anxiety and depression-continue risperidone, Ritalin, Zoloft, trazodone  Patient will be dialyzed after her fistulogram. Once remains stable she'll be discharged to home later on in the evening. Patient is a ware of the plan and agreeable CONSULTS OBTAINED:  Treatment Team:  Lamont DowdySarath Kolluru, MD  DRUG ALLERGIES:   Allergies  Allergen Reactions  . Dhea [Nutritional Supplements] Anaphylaxis    Patient states medication is DHE for migraines, not DHEA  . Demerol [Meperidine] Other (See Comments)    Reaction:  Hallucinations   . Floxin [Ofloxacin] Other (See Comments)    Reaction:  Hallucinations   . Nsaids Nausea And Vomiting and Swelling  . Nubain [Nalbuphine Hcl] Other (See Comments)    Reaction:  Hallucinations   . Phenergan [Promethazine Hcl] Other (See Comments)    Reaction:  Restless legs   . Stadol [Butorphanol] Other (See Comments)    Reaction:  Hallucinations   . Erythromycin Diarrhea and Rash    DISCHARGE MEDICATIONS:   Current Discharge Medication List    CONTINUE these medications which have NOT CHANGED   Details  estradiol (ESTRACE) 0.1 MG/GM vaginal cream Place 1 Applicatorful vaginally 2 (two) times a week. Mondays and Thursdays    fenofibrate (TRICOR) 145 MG tablet Take 145 mg by mouth every other day.    fludrocortisone (FLORINEF) 0.1 MG tablet Take 0.1 mg by mouth daily.    furosemide (LASIX) 40 MG tablet Take 40 mg by mouth 2 (two) times daily.    gabapentin (NEURONTIN) 100 MG capsule Take 400 mg by mouth at bedtime.    hydrocortisone (CORTEF) 10 MG tablet Take 5-10  mg by mouth 2 (two) times daily. Pt takes 10 mg in the morning and 5 mg at bedtime.    levothyroxine (SYNTHROID, LEVOTHROID) 112 MCG tablet Take 112 mcg by mouth daily.     loratadine (CLARITIN) 10 MG tablet Take 10 mg by mouth daily.    lovastatin (MEVACOR) 40 MG tablet Take 20 mg by mouth every evening.      Melatonin 5 MG TABS Take 2 tablets by mouth at bedtime.    methylphenidate (RITALIN LA) 30 MG 24 hr capsule Take 30 mg by mouth every morning.    nystatin ointment (MYCOSTATIN) Apply 1 application topically 2 (two) times daily. Apply to affected area (groin, abdominal fold). Qty: 30 g, Refills: 0    pantoprazole (PROTONIX) 40 MG tablet Take 1 tablet (40 mg total) by mouth daily. Qty: 30 tablet, Refills: 0    risperidone (RISPERDAL) 4 MG tablet Take 2-4 mg by mouth 2 (two) times daily. Pt takes 4 mg in the morning and 2 mg at bedtime.    sertraline (ZOLOFT) 50 MG tablet Take 50 mg by mouth daily.    sevelamer carbonate (RENVELA) 800 MG tablet Take 2,400 mg by mouth 2 (two) times daily.     !! topiramate (TOPAMAX) 200 MG tablet Take 200 mg by mouth 2 (two) times daily. Pt takes with a 50 mg tablet.    !! topiramate (TOPAMAX) 50 MG tablet Take 50 mg by mouth 2 (two) times daily. Pt takes with a 200 mg tablet.    traZODone (DESYREL) 100 MG tablet Take 100 mg by mouth at bedtime.    Vitamin D, Ergocalciferol, (DRISDOL) 50000 UNITS CAPS capsule Take 50,000 Units by mouth every 30 (thirty) days. Takes on the 8th of every month    ondansetron (ZOFRAN) 4 MG tablet Take 1 tablet (4 mg total) by mouth every 8 (eight) hours as needed for nausea or vomiting. Qty: 20 tablet, Refills: 0    oxyCODONE-acetaminophen (ROXICET) 5-325 MG tablet Take 1 tablet by mouth every 6 (six) hours as needed. Qty: 20 tablet, Refills: 0     !! - Potential duplicate medications found. Please discuss with provider.    STOP taking these medications     HYDROmorphone (DILAUDID) 4 MG tablet         If you experience worsening of your admission symptoms, develop shortness of breath, life threatening emergency, suicidal or homicidal thoughts you must seek medical attention immediately by calling 911 or calling your MD immediately  if symptoms less severe.  You Must read complete instructions/literature along with  all the possible adverse reactions/side effects for all the Medicines you take and that have been prescribed to you. Take any new Medicines after you have completely understood and accept all the possible adverse reactions/side effects.   Please note  You were cared for by a hospitalist during your hospital stay. If you have any questions about your discharge medications or the care you received while you were in the hospital after you are discharged, you can call the unit and asked to speak with the hospitalist on call if the hospitalist that took care of you is not available. Once you are discharged, your primary care physician will handle any further medical issues. Please note that NO REFILLS for any discharge medications will be authorized once you are discharged, as it is imperative that you return to your primary care physician (or establish a relationship with a primary care physician if you do not have one) for your  aftercare needs so that they can reassess your need for medications and monitor your lab values. Today   SUBJECTIVE   C/o chronic pain Doing ok VITAL SIGNS:  Blood pressure 125/65, pulse (!) 48, temperature 98.1 F (36.7 C), temperature source Oral, resp. rate 17, height 4\' 10"  (1.473 m), weight 65 kg (143 lb 4.8 oz), last menstrual period 11/21/2007, SpO2 97 %.  I/O:   Intake/Output Summary (Last 24 hours) at 10/28/16 1325 Last data filed at 10/28/16 0855  Gross per 24 hour  Intake              360 ml  Output                0 ml  Net              360 ml    PHYSICAL EXAMINATION:  GENERAL:  45 y.o.-year-old patient lying in the bed with no acute distress.  EYES: Pupils equal, round, reactive to light and accommodation. No scleral icterus. Extraocular muscles intact. Legally blind HEENT: Head atraumatic, normocephalic. Oropharynx and nasopharynx clear.  NECK:  Supple, no jugular venous distention. No thyroid enlargement, no tenderness.  LUNGS: Normal breath sounds  bilaterally, no wheezing, rales,rhonchi or crepitation. No use of accessory muscles of respiration.  CARDIOVASCULAR: S1, S2 normal. No murmurs, rubs, or gallops.  ABDOMEN: Soft, non-tender, non-distended. Bowel sounds present. No organomegaly or mass.  EXTREMITIES: No pedal edema, cyanosis, or clubbing.  NEUROLOGIC: Cranial nerves II through XII are intact. Muscle strength 5/5 in all extremities. Sensation intact. Gait not checked.  PSYCHIATRIC: The patient is alert and oriented x 3.  SKIN: No obvious rash, lesion, or ulcer.   DATA REVIEW:   CBC   Recent Labs Lab 10/27/16 0506  WBC 6.0  HGB 11.0*  HCT 33.1*  PLT 177    Chemistries   Recent Labs Lab 10/26/16 1651  10/28/16 0504  NA 133*  < > 134*  K 5.5*  < > 5.8*  CL 105  < > 106  CO2 19*  < > 19*  GLUCOSE 71  < > 82  BUN 38*  < > 42*  CREATININE 7.42*  < > 7.84*  CALCIUM 8.0*  < > 7.2*  AST 29  --   --   ALT 8*  --   --   ALKPHOS 218*  --   --   BILITOT 0.4  --   --   < > = values in this interval not displayed.  Microbiology Results   Recent Results (from the past 240 hour(s))  Surgical PCR screen     Status: Abnormal   Collection Time: 10/27/16 10:17 PM  Result Value Ref Range Status   MRSA, PCR NEGATIVE NEGATIVE Final   Staphylococcus aureus POSITIVE (A) NEGATIVE Final    Comment:        The Xpert SA Assay (FDA approved for NASAL specimens in patients over 31 years of age), is one component of a comprehensive surveillance program.  Test performance has been validated by [facility] for patients greater than or equal to 19 year old. It is not intended to diagnose infection nor to guide or monitor treatment.     RADIOLOGY:  Dg Lumbar Spine Complete  Result Date: 10/26/2016 CLINICAL DATA:  Lower back pain after fall last night. EXAM: LUMBAR SPINE - COMPLETE 4+ VIEW COMPARISON:  CT scan of July 04, 2016. FINDINGS: There is no evidence of lumbar spine fracture. Alignment is normal. Intervertebral  disc  spaces are maintained. IMPRESSION: Normal lumbar spine. Electronically Signed   By: Lupita Raider, M.D.   On: 10/26/2016 18:57   Dg Chest Portable 1 View  Result Date: 10/26/2016 CLINICAL DATA:  Status post fall, with left shoulder pain. Initial encounter. EXAM: PORTABLE CHEST 1 VIEW COMPARISON:  Chest radiograph performed 10/23/2016 FINDINGS: The lungs are well-aerated and clear. There is no evidence of focal opacification, pleural effusion or pneumothorax. The cardiomediastinal silhouette is borderline normal in size. No acute osseous abnormalities are seen. IMPRESSION: No acute cardiopulmonary process seen. Electronically Signed   By: Roanna Raider M.D.   On: 10/26/2016 19:26   Dg Shoulder Left  Result Date: 10/26/2016 CLINICAL DATA:  Patient fell last night and complains of left shoulder pain. EXAM: LEFT SHOULDER - 2+ VIEW COMPARISON:  07/09/2014 FINDINGS: Bones are diffusely demineralized. No evidence for an acute fracture. No evidence for shoulder dislocation or separation. Vascular stents noted proximal left all arm with apparent graft in situ. IMPRESSION: Negative. Electronically Signed   By: Kennith Center M.D.   On: 10/26/2016 18:54   Dg Hip Unilat W Or Wo Pelvis 2-3 Views Right  Result Date: 10/26/2016 CLINICAL DATA:  Status post fall, with right hip pain. Initial encounter. EXAM: DG HIP (WITH OR WITHOUT PELVIS) 2-3V RIGHT COMPARISON:  Right hip radiographs performed 05/09/2016 FINDINGS: There is no evidence of fracture or dislocation. There is mildly increased uncovering of the right femoral head, suggestive of an underlying right hip joint effusion. Slight cortical irregularity is noted along the right femoral head, with mild chronic flattening. The left hip joint is unremarkable in appearance. The sacroiliac joints are unremarkable in appearance. The visualized bowel gas pattern is grossly unremarkable in appearance. A vascular stent is noted overlying the proximal left thigh.  IMPRESSION: 1. No evidence of fracture or dislocation. 2. Mildly increased uncovering of the right femoral head, suggestive of an underlying right hip joint effusion. Slight cortical irregularity along the right femoral head, with mild chronic flattening. Electronically Signed   By: Roanna Raider M.D.   On: 10/26/2016 18:56     Management plans discussed with the patient, family and they are in agreement.  CODE STATUS:     Code Status Orders        Start     Ordered   10/26/16 2145  Full code  Continuous     10/26/16 2144    Code Status History    Date Active Date Inactive Code Status Order ID Comments User Context   10/02/2016  5:09 AM 10/04/2016  7:05 PM Full Code [id number]  Arnaldo Natal, MD Inpatient   06/27/2016  8:27 PM 06/30/2016  5:15 PM Full Code [id number]  Delano Metz, MD Inpatient   03/04/2016  6:04 AM 03/07/2016  6:24 PM Full Code [id number]  Oralia Manis, MD Inpatient   02/25/2016  3:36 PM 03/01/2016  3:06 PM Full Code [id number]  Enedina Finner, MD Inpatient      TOTAL TIME TAKING CARE OF THIS PATIENT: .    Loyce Klasen M.D on 10/28/2016 at 1:25 PM  Between 7am to 6pm - Pager - [phone] After 6pm go to [email or web address] - password [email or web address] [facility]  [facility]  Office  [phone]  CC: Primary care physician; [facility] PRIMARY CARE

## 2016-10-28 NOTE — Progress Notes (Signed)
Again pt asked if she would reconsider going to dialysis now. Pt states " I am tired and I do not want dialysis". " I will do it tomorrow. ". Pt made statement to myself and Vicky Rn. Info relayed to floor nurse Lucrezia Starch. Pt resting quietly resp regular and unlabored otherwise pt remains alert and oriented and at her baseline. Pt incontinent of stool again and cleaned up. No co pain. resp regular and unlabored.

## 2016-10-28 NOTE — Progress Notes (Signed)
Pt states she refuses to do dialysis today. Discussed with pt need for dialysis. Pt states she still refuses.

## 2016-10-28 NOTE — Discharge Instructions (Signed)
Resume usual hemodialysis as before

## 2016-10-28 NOTE — Progress Notes (Signed)
Potassium 5.8. MD paged. Awaiting response.

## 2016-10-28 NOTE — Progress Notes (Signed)
[facility]  ROUNDING NOTE   Subjective:   Patient did not get dialysis yesterday due to thrombosis in AVF. Still with bruit. Scheduled for fistologram today.  Given kayexalate yesterday and today Potassium still elevated.   Objective:  Vital signs in last 24 hours:  Temp:  [97.5 F (36.4 C)-98 F (36.7 C)] 97.5 F (36.4 C) (12/29 0811) Pulse Rate:  [48-56] 52 (12/29 0814) Resp:  [18-20] 20 (12/29 0811) BP: (125-141)/(58-60) 137/58 (12/29 0811) SpO2:  [99 %-100 %] 100 % (12/29 0811)  Weight change: -3.493 kg (-7 lb 11.2 oz) Filed Weights   10/26/16 1615 10/27/16 1033  Weight: 68.5 kg (151 lb) 65 kg (143 lb 4.8 oz)    Intake/Output: I/O last 3 completed shifts: In: 120 [P.O.:120] Out: 150 [Urine:150]   Intake/Output this shift:  Total I/O In: 360 [P.O.:360] Out: -   Physical Exam: General: NAD  Head: Normocephalic, atraumatic. Moist oral mucosal membranes  Eyes: Anicteric, PERRL  Neck: Supple, trachea midline  Lungs:  Clear to auscultation  Heart: Regular rate and rhythm  Abdomen:  Soft, nontender,   Extremities: + peripheral edema.  Neurologic: Nonfocal, moving all four extremities  Skin: No lesions  Access: Right arm AVF - bruit    Basic Metabolic Panel:  Recent Labs Lab 10/23/16 2112 10/26/16 1651 10/27/16 0506 10/28/16 0504  NA 132* 133* 130* 134*  K 4.6 5.5* 6.3* 5.8*  CL 101 105 102 106  CO2 24 19* 20* 19*  GLUCOSE 86 71 83 82  BUN 30* 38* 38* 42*  CREATININE 5.25* 7.42* 7.21* 7.84*  CALCIUM 8.1* 8.0* 8.1* 7.2*    Liver Function Tests:  Recent Labs Lab 10/26/16 1651  AST 29  ALT 8*  ALKPHOS 218*  BILITOT 0.4  PROT 5.8*  ALBUMIN 3.2*    Recent Labs Lab 10/23/16 2112  LIPASE 45   No results for input(s): AMMONIA in the last 168 hours.  CBC:  Recent Labs Lab 10/23/16 2112 10/26/16 1651 10/27/16 0506  WBC 7.6 7.1 6.0  NEUTROABS  --  4.0  --   HGB 10.7* 10.9* 11.0*  HCT 32.2* 33.1* 33.1*  MCV 93.9 94.1  95.1  PLT 183 197 177    Cardiac Enzymes:  Recent Labs Lab 10/23/16 2112  TROPONINI <0.03    BNP: Invalid input(s): POCBNP  CBG: No results for input(s): GLUCAP in the last 168 hours.  Microbiology: Results for orders placed or performed during the hospital encounter of 10/26/16  Surgical PCR screen     Status: Abnormal   Collection Time: 10/27/16 10:17 PM  Result Value Ref Range Status   MRSA, PCR NEGATIVE NEGATIVE Final   Staphylococcus aureus POSITIVE (A) NEGATIVE Final    Comment:        The Xpert SA Assay (FDA approved for NASAL specimens in patients over 80 years of age), is one component of a comprehensive surveillance program.  Test performance has been validated by [facility] for patients greater than or equal to 93 year old. It is not intended to diagnose infection nor to guide or monitor treatment.     Coagulation Studies: No results for input(s): LABPROT, INR in the last 72 hours.  Urinalysis: No results for input(s): COLORURINE, LABSPEC, PHURINE, GLUCOSEU, HGBUR, BILIRUBINUR, KETONESUR, PROTEINUR, UROBILINOGEN, NITRITE, LEUKOCYTESUR in the last 72 hours.  Invalid input(s): APPERANCEUR    Imaging: Dg Lumbar Spine Complete  Result Date: 10/26/2016 CLINICAL DATA:  Lower back pain after fall last night. EXAM: LUMBAR SPINE - COMPLETE  4+ VIEW COMPARISON:  CT scan of July 04, 2016. FINDINGS: There is no evidence of lumbar spine fracture. Alignment is normal. Intervertebral disc spaces are maintained. IMPRESSION: Normal lumbar spine. Electronically Signed   By: Lupita Raider, M.D.   On: 10/26/2016 18:57   Dg Chest Portable 1 View  Result Date: 10/26/2016 CLINICAL DATA:  Status post fall, with left shoulder pain. Initial encounter. EXAM: PORTABLE CHEST 1 VIEW COMPARISON:  Chest radiograph performed 10/23/2016 FINDINGS: The lungs are well-aerated and clear. There is no evidence of focal opacification, pleural effusion or pneumothorax. The  cardiomediastinal silhouette is borderline normal in size. No acute osseous abnormalities are seen. IMPRESSION: No acute cardiopulmonary process seen. Electronically Signed   By: Roanna Raider M.D.   On: 10/26/2016 19:26   Dg Shoulder Left  Result Date: 10/26/2016 CLINICAL DATA:  Patient fell last night and complains of left shoulder pain. EXAM: LEFT SHOULDER - 2+ VIEW COMPARISON:  07/09/2014 FINDINGS: Bones are diffusely demineralized. No evidence for an acute fracture. No evidence for shoulder dislocation or separation. Vascular stents noted proximal left all arm with apparent graft in situ. IMPRESSION: Negative. Electronically Signed   By: Kennith Center M.D.   On: 10/26/2016 18:54   Dg Hip Unilat W Or Wo Pelvis 2-3 Views Right  Result Date: 10/26/2016 CLINICAL DATA:  Status post fall, with right hip pain. Initial encounter. EXAM: DG HIP (WITH OR WITHOUT PELVIS) 2-3V RIGHT COMPARISON:  Right hip radiographs performed 05/09/2016 FINDINGS: There is no evidence of fracture or dislocation. There is mildly increased uncovering of the right femoral head, suggestive of an underlying right hip joint effusion. Slight cortical irregularity is noted along the right femoral head, with mild chronic flattening. The left hip joint is unremarkable in appearance. The sacroiliac joints are unremarkable in appearance. The visualized bowel gas pattern is grossly unremarkable in appearance. A vascular stent is noted overlying the proximal left thigh. IMPRESSION: 1. No evidence of fracture or dislocation. 2. Mildly increased uncovering of the right femoral head, suggestive of an underlying right hip joint effusion. Slight cortical irregularity along the right femoral head, with mild chronic flattening. Electronically Signed   By: Roanna Raider M.D.   On: 10/26/2016 18:56     Medications:    . Chlorhexidine Gluconate Cloth  6 each Topical Daily  . estradiol  1 Applicatorful Vaginal Once per day on Mon Thu  .  fenofibrate  160 mg Oral Daily  . fludrocortisone  0.1 mg Oral Daily  . furosemide  40 mg Oral BID  . gabapentin  400 mg Oral QHS  . heparin subcutaneous  5,000 Units Subcutaneous Q8H  . hydrocortisone  10 mg Oral q morning - 10a  . hydrocortisone  5 mg Oral QHS  . levothyroxine  112 mcg Oral QAC breakfast  . loratadine  10 mg Oral Daily  . Melatonin  2 tablet Oral QHS  . mupirocin ointment  1 application Nasal BID  . pantoprazole  40 mg Oral Daily  . pravastatin  20 mg Oral q1800  . risperiDONE  2 mg Oral QHS  . risperiDONE  4 mg Oral q morning - 10a  . sertraline  50 mg Oral Daily  . sevelamer carbonate  2,400 mg Oral BID  . topiramate  200 mg Oral BID  . topiramate  50 mg Oral BID  . traZODone  100 mg Oral QHS  . [START ON 11/07/2016] Vitamin D (Ergocalciferol)  50,000 Units Oral Q30 days   acetaminophen **OR**  acetaminophen, albuterol, bisacodyl, diphenhydrAMINE, HYDROmorphone, magnesium citrate, ondansetron **OR** ondansetron (ZOFRAN) IV, oxyCODONE-acetaminophen, senna-docusate  Assessment/ Plan:  Erin [name] is a 45 y.o. white female with End stage renal disease on hemodialysis, blindness, hypertension, diabetes mellitus type II, hyperlipidemia  TTS [facility]  1. End Stage Renal Disease with hyperkalemia and complication with dialysis device.  - Kayexalate ordered. Also given yesterday 12/28 - Unable to get dialysis currently. Fistulogram later today. Appreciate vascular input.  - dialysis hopefully for later today  2. Hypotension: on hydrocortisone.   3. Anemia of chronic kidney disease: hemoglobin 11 - hold epo due to thrombosis  4. Secondary Hyperparathyroidism: outpatient PTH, phosphorus and calcium at goal - Continue sevelamer with meals.    LOS: 0 Georgiann Neider 12/29/201711:34 AM

## 2016-10-28 NOTE — Progress Notes (Signed)
MD notified of potassium 5.8. Ok per MD, no new orders received.

## 2016-10-28 NOTE — Progress Notes (Signed)
Dr. Enedina Finner was notified of patient refusal to dialysis even in the room. So patient will not  be discharge tonight.

## 2016-10-28 NOTE — Progress Notes (Signed)
PT Cancellation Note  Patient Details Name: Erin [name] MRN: [id number] DOB: August 25, 1971   Cancelled Treatment:    Reason Eval/Treat Not Completed: Medical issues which prohibited therapy. Pt currently NPO for fistulagram this date. Pt also with increased K+ levels at 5.8. Will hold this AM and re-assess if able.   Kamalani Mastro 10/28/2016, 9:00 AM Elizabeth Palau, PT, DPT [phone]

## 2016-10-28 NOTE — Care Management (Signed)
Plan for discharge after dialysis today.  Notified [facility] HD liaison of anticipated discharge.  EMS transport sheet completed and placed on patient chart.

## 2016-10-29 DIAGNOSIS — T82868A Thrombosis of vascular prosthetic devices, implants and grafts, initial encounter: Secondary | ICD-10-CM | POA: Diagnosis not present

## 2016-10-29 LAB — CBC
HCT: 27.8 % — ABNORMAL LOW (ref 35.0–47.0)
HEMOGLOBIN: 9.2 g/dL — AB (ref 12.0–16.0)
MCH: 31.2 pg (ref 26.0–34.0)
MCHC: 32.9 g/dL (ref 32.0–36.0)
MCV: 94.7 fL (ref 80.0–100.0)
Platelets: 157 10*3/uL (ref 150–440)
RBC: 2.94 MIL/uL — AB (ref 3.80–5.20)
RDW: 19 % — ABNORMAL HIGH (ref 11.5–14.5)
WBC: 4.3 10*3/uL (ref 3.6–11.0)

## 2016-10-29 LAB — RENAL FUNCTION PANEL
ANION GAP: 11 (ref 5–15)
Albumin: 3.3 g/dL — ABNORMAL LOW (ref 3.5–5.0)
BUN: 47 mg/dL — ABNORMAL HIGH (ref 6–20)
CALCIUM: 6.4 mg/dL — AB (ref 8.9–10.3)
CO2: 20 mmol/L — AB (ref 22–32)
Chloride: 104 mmol/L (ref 101–111)
Creatinine, Ser: 8.53 mg/dL — ABNORMAL HIGH (ref 0.44–1.00)
GFR, EST AFRICAN AMERICAN: 6 mL/min — AB (ref >60)
GFR, EST NON AFRICAN AMERICAN: 5 mL/min — AB (ref >60)
Glucose, Bld: 74 mg/dL (ref 65–99)
Phosphorus: 8.9 mg/dL — ABNORMAL HIGH (ref 2.5–4.6)
Potassium: 3.4 mmol/L — ABNORMAL LOW (ref 3.5–5.1)
Sodium: 135 mmol/L (ref 135–145)

## 2016-10-29 NOTE — Progress Notes (Signed)
Post hd vitals 

## 2016-10-29 NOTE — Progress Notes (Signed)
Pre hd info 

## 2016-10-29 NOTE — Progress Notes (Signed)
  End of hd 

## 2016-10-29 NOTE — Progress Notes (Signed)
Pre hd assessment  

## 2016-10-29 NOTE — Progress Notes (Signed)
[facility]  ROUNDING NOTE   Subjective:   Balloon angioplasty yesterday. Refused dialysis yesterday evening. Dialysis for later today.    Objective:  Vital signs in last 24 hours:  Temp:  [97.4 F (36.3 C)-98.1 F (36.7 C)] 97.7 F (36.5 C) (12/30 0755) Pulse Rate:  [48-54] 52 (12/30 0755) Resp:  [10-20] 18 (12/30 0755) BP: (112-148)/(65-73) 138/66 (12/30 0755) SpO2:  [94 %-100 %] 97 % (12/30 0505)  Weight change:  Filed Weights   10/26/16 1615 10/27/16 1033  Weight: 68.5 kg (151 lb) 65 kg (143 lb 4.8 oz)    Intake/Output: I/O last 3 completed shifts: In: 600 [P.O.:600] Out: -    Intake/Output this shift:  Total I/O In: 120 [P.O.:120] Out: -   Physical Exam: General: NAD  Head: Normocephalic, atraumatic. Moist oral mucosal membranes  Eyes: Not examined  Neck: Supple, trachea midline  Lungs:  Clear to auscultation  Heart: Regular rate and rhythm  Abdomen:  Soft, nontender,   Extremities: + peripheral edema.  Neurologic: Nonfocal, moving all four extremities  Skin: No lesions  Access: Right arm AVF - bruit    Basic Metabolic Panel:  Recent Labs Lab 10/23/16 2112 10/26/16 1651 10/27/16 0506 10/28/16 0504  NA 132* 133* 130* 134*  K 4.6 5.5* 6.3* 5.8*  CL 101 105 102 106  CO2 24 19* 20* 19*  GLUCOSE 86 71 83 82  BUN 30* 38* 38* 42*  CREATININE 5.25* 7.42* 7.21* 7.84*  CALCIUM 8.1* 8.0* 8.1* 7.2*    Liver Function Tests:  Recent Labs Lab 10/26/16 1651  AST 29  ALT 8*  ALKPHOS 218*  BILITOT 0.4  PROT 5.8*  ALBUMIN 3.2*    Recent Labs Lab 10/23/16 2112  LIPASE 45   No results for input(s): AMMONIA in the last 168 hours.  CBC:  Recent Labs Lab 10/23/16 2112 10/26/16 1651 10/27/16 0506  WBC 7.6 7.1 6.0  NEUTROABS  --  4.0  --   HGB 10.7* 10.9* 11.0*  HCT 32.2* 33.1* 33.1*  MCV 93.9 94.1 95.1  PLT 183 197 177    Cardiac Enzymes:  Recent Labs Lab 10/23/16 2112  TROPONINI <0.03    BNP: Invalid input(s):  POCBNP  CBG: No results for input(s): GLUCAP in the last 168 hours.  Microbiology: Results for orders placed or performed during the hospital encounter of 10/26/16  Surgical PCR screen     Status: Abnormal   Collection Time: 10/27/16 10:17 PM  Result Value Ref Range Status   MRSA, PCR NEGATIVE NEGATIVE Final   Staphylococcus aureus POSITIVE (A) NEGATIVE Final    Comment:        The Xpert SA Assay (FDA approved for NASAL specimens in patients over 40 years of age), is one component of a comprehensive surveillance program.  Test performance has been validated by [facility] for patients greater than or equal to 40 year old. It is not intended to diagnose infection nor to guide or monitor treatment.     Coagulation Studies: No results for input(s): LABPROT, INR in the last 72 hours.  Urinalysis: No results for input(s): COLORURINE, LABSPEC, PHURINE, GLUCOSEU, HGBUR, BILIRUBINUR, KETONESUR, PROTEINUR, UROBILINOGEN, NITRITE, LEUKOCYTESUR in the last 72 hours.  Invalid input(s): APPERANCEUR    Imaging: No results found.   Medications:    . Chlorhexidine Gluconate Cloth  6 each Topical Daily  . estradiol  1 Applicatorful Vaginal Once per day on Mon Thu  . fenofibrate  160 mg Oral Daily  . fludrocortisone  0.1 mg Oral Daily  . furosemide  40 mg Oral BID  . gabapentin  400 mg Oral QHS  . heparin subcutaneous  5,000 Units Subcutaneous Q8H  . hydrocortisone  10 mg Oral q morning - 10a  . hydrocortisone  5 mg Oral QHS  . levothyroxine  112 mcg Oral QAC breakfast  . loratadine  10 mg Oral Daily  . Melatonin  2 tablet Oral QHS  . mupirocin ointment  1 application Nasal BID  . pantoprazole  40 mg Oral Daily  . pravastatin  20 mg Oral q1800  . risperiDONE  2 mg Oral QHS  . risperiDONE  4 mg Oral q morning - 10a  . sertraline  50 mg Oral Daily  . sevelamer carbonate  2,400 mg Oral BID  . topiramate  200 mg Oral BID  . topiramate  50 mg Oral BID  . traZODone  100 mg Oral  QHS  . [START ON 11/07/2016] Vitamin D (Ergocalciferol)  50,000 Units Oral Q30 days   acetaminophen **OR** acetaminophen, albuterol, bisacodyl, diphenhydrAMINE, magnesium citrate, ondansetron **OR** ondansetron (ZOFRAN) IV, oxyCODONE-acetaminophen, senna-docusate, traMADol  Assessment/ Plan:  Erin [name] is a 45 y.o. white female with End stage renal disease on hemodialysis, blindness, hypertension, diabetes mellitus type II, hyperlipidemia  TTS [facility]  1. End Stage Renal Disease with hyperkalemia and complication with dialysis device. Fistulogram and balloon angioplasty Dr. Gilda CreaseSchnier on 12/29 - Kayexalate 12/28 and 12/29 - Last full dialysis treatment was 12/26 - Appreciate vascular input.  - dialysis for today. Orders prepared.   2. Hypotension: on hydrocortisone.   3. Anemia of chronic kidney disease: hemoglobin 11 - hold epo due to thrombosis  4. Secondary Hyperparathyroidism: outpatient PTH, phosphorus and calcium at goal - Continue sevelamer with meals.    LOS: 0 [name] 12/30/201711:23 AM

## 2016-10-29 NOTE — Discharge Summary (Addendum)
[facility] - [place] [facility]   PATIENT NAME: Erin [name]    MR#:  [id number]  DATE OF BIRTH:  1971/09/05  DATE OF ADMISSION:  10/26/2016 ADMITTING PHYSICIAN: Tonye Royalty, DO  DATE OF DISCHARGE: 10/28/16  PRIMARY CARE PHYSICIAN: [facility]    ADMISSION DIAGNOSIS:  Pain [R52] Multiple contusions [T07.XXXA] Fall, initial encounter [id number]]  DISCHARGE DIAGNOSIS:  Mechanical fall without injury mild shoulder contusion on the right End-stage renal disease on hemodialysis HD access malfunction s/p angioplasty and stent placement in the right Av graft on 10/28/16 Legally blind Chronic pain syndrome SECONDARY DIAGNOSIS:   Past Medical History:  Diagnosis Date  . Anxiety   . Asthma   . Bipolar affect, depressed (HCC)   . Blind   . Chronic headaches    Seeing Pain Management  . Constipation   . Depression   . Epilepsy (HCC)   . Fibromyalgia   . History of CVA (cerebrovascular accident)   . History of DVT (deep vein thrombosis)   . Hyperlipidemia   . Hypothyroid   . Kidney failure    Currently on Dialysis  . Narcolepsy   . 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 Psychosis     HOSPITAL COURSE:   45 y.o.femalewith a history of end-stage renal disease on hemodialysis, legally blind, CVA, DVT, hyperlipidemianow being admitted with: 1. Intractable pain status post mechanical fall -When necessary by mouth pain meds  2. History of hyperlipidemia-continue TriCor and Mevacor  3. History of end-stage renal disease on hemodialysis-continue Florinef, hydrocortisone, Renvela, vitamin D Patient has had some issues with her fistula. She is scheduled for fistulogram today. Patient will be dialyzed after that -Hyperkalemia treated with Kayexalate By nephrology -pt declined HD yday and so had to hold her discharge -HD today and then d/c home  4. History of neuropathy and chronic  pain-continue Neurontin, Topamax  5. History of hypothyroid-continue Synthroid  6. History of allergies-continue Claritin  6. History of GERD-continue Protonix  7. History of psychosis, anxiety and depression-continue risperidone, Ritalin, Zoloft, trazodone  D/c home after HD CONSULTS OBTAINED:  Treatment Team:  Lamont Dowdy, MD  DRUG ALLERGIES:   Allergies  Allergen Reactions  . Dhea [Nutritional Supplements] Anaphylaxis    Patient states medication is DHE for migraines, not DHEA  . Demerol [Meperidine] Other (See Comments)    Reaction:  Hallucinations   . Floxin [Ofloxacin] Other (See Comments)    Reaction:  Hallucinations   . Nsaids Nausea And Vomiting and Swelling  . Nubain [Nalbuphine Hcl] Other (See Comments)    Reaction:  Hallucinations   . Phenergan [Promethazine Hcl] Other (See Comments)    Reaction:  Restless legs   . Stadol [Butorphanol] Other (See Comments)    Reaction:  Hallucinations   . Erythromycin Diarrhea and Rash    DISCHARGE MEDICATIONS:   Current Discharge Medication List    CONTINUE these medications which have NOT CHANGED   Details  estradiol (ESTRACE) 0.1 MG/GM vaginal cream Place 1 Applicatorful vaginally 2 (two) times a week. Mondays and Thursdays    fenofibrate (TRICOR) 145 MG tablet Take 145 mg by mouth every other day.    fludrocortisone (FLORINEF) 0.1 MG tablet Take 0.1 mg by mouth daily.    furosemide (LASIX) 40 MG tablet Take 40 mg by mouth 2 (two) times daily.    gabapentin (NEURONTIN) 100 MG capsule Take 400 mg by mouth at bedtime.    hydrocortisone (CORTEF) 10 MG tablet Take 5-10 mg  by mouth 2 (two) times daily. Pt takes 10 mg in the morning and 5 mg at bedtime.    levothyroxine (SYNTHROID, LEVOTHROID) 112 MCG tablet Take 112 mcg by mouth daily.     loratadine (CLARITIN) 10 MG tablet Take 10 mg by mouth daily.    lovastatin (MEVACOR) 40 MG tablet Take 20 mg by mouth every evening.     Melatonin 5 MG TABS Take 2  tablets by mouth at bedtime.    methylphenidate (RITALIN LA) 30 MG 24 hr capsule Take 30 mg by mouth every morning.    nystatin ointment (MYCOSTATIN) Apply 1 application topically 2 (two) times daily. Apply to affected area (groin, abdominal fold). Qty: 30 g, Refills: 0    pantoprazole (PROTONIX) 40 MG tablet Take 1 tablet (40 mg total) by mouth daily. Qty: 30 tablet, Refills: 0    risperidone (RISPERDAL) 4 MG tablet Take 2-4 mg by mouth 2 (two) times daily. Pt takes 4 mg in the morning and 2 mg at bedtime.    sertraline (ZOLOFT) 50 MG tablet Take 50 mg by mouth daily.    sevelamer carbonate (RENVELA) 800 MG tablet Take 2,400 mg by mouth 2 (two) times daily.     !! topiramate (TOPAMAX) 200 MG tablet Take 200 mg by mouth 2 (two) times daily. Pt takes with a 50 mg tablet.    !! topiramate (TOPAMAX) 50 MG tablet Take 50 mg by mouth 2 (two) times daily. Pt takes with a 200 mg tablet.    traZODone (DESYREL) 100 MG tablet Take 100 mg by mouth at bedtime.    Vitamin D, Ergocalciferol, (DRISDOL) 50000 UNITS CAPS capsule Take 50,000 Units by mouth every 30 (thirty) days. Takes on the 8th of every month    ondansetron (ZOFRAN) 4 MG tablet Take 1 tablet (4 mg total) by mouth every 8 (eight) hours as needed for nausea or vomiting. Qty: 20 tablet, Refills: 0    oxyCODONE-acetaminophen (ROXICET) 5-325 MG tablet Take 1 tablet by mouth every 6 (six) hours as needed. Qty: 20 tablet, Refills: 0     !! - Potential duplicate medications found. Please discuss with provider.    STOP taking these medications     HYDROmorphone (DILAUDID) 4 MG tablet         If you experience worsening of your admission symptoms, develop shortness of breath, life threatening emergency, suicidal or homicidal thoughts you must seek medical attention immediately by calling 911 or calling your MD immediately  if symptoms less severe.  You Must read complete instructions/literature along with all the possible adverse  reactions/side effects for all the Medicines you take and that have been prescribed to you. Take any new Medicines after you have completely understood and accept all the possible adverse reactions/side effects.   Please note  You were cared for by a hospitalist during your hospital stay. If you have any questions about your discharge medications or the care you received while you were in the hospital after you are discharged, you can call the unit and asked to speak with the hospitalist on call if the hospitalist that took care of you is not available. Once you are discharged, your primary care physician will handle any further medical issues. Please note that NO REFILLS for any discharge medications will be authorized once you are discharged, as it is imperative that you return to your primary care physician (or establish a relationship with a primary care physician if you do not have one) for your aftercare  needs so that they can reassess your need for medications and monitor your lab values. Today   SUBJECTIVE   C/o chronic pain Doing ok VITAL SIGNS:  Blood pressure 138/66, pulse (!) 52, temperature 97.7 F (36.5 C), temperature source Oral, resp. rate 18, height 4\' 10"  (1.473 m), weight 65 kg (143 lb 4.8 oz), last menstrual period 11/21/2007, SpO2 97 %.  I/O:    Intake/Output Summary (Last 24 hours) at 10/29/16 0821 Last data filed at 10/29/16 0600  Gross per 24 hour  Intake              600 ml  Output                0 ml  Net              600 ml    PHYSICAL EXAMINATION:  GENERAL:  45 y.o.-year-old patient lying in the bed with no acute distress.  EYES: Pupils equal, round, reactive to light and accommodation. No scleral icterus. Extraocular muscles intact. Legally blind HEENT: Head atraumatic, normocephalic. Oropharynx and nasopharynx clear.  NECK:  Supple, no jugular venous distention. No thyroid enlargement, no tenderness.  LUNGS: Normal breath sounds bilaterally, no wheezing,  rales,rhonchi or crepitation. No use of accessory muscles of respiration.  CARDIOVASCULAR: S1, S2 normal. No murmurs, rubs, or gallops.  ABDOMEN: Soft, non-tender, non-distended. Bowel sounds present. No organomegaly or mass.  EXTREMITIES: No pedal edema, cyanosis, or clubbing. HD access right arm NEUROLOGIC: Cranial nerves II through XII are intact. Muscle strength 5/5 in all extremities. Sensation intact. Gait not checked.  PSYCHIATRIC: The patient is alert and oriented x 3.  SKIN: No obvious rash, lesion, or ulcer.   DATA REVIEW:   CBC   Recent Labs Lab 10/27/16 0506  WBC 6.0  HGB 11.0*  HCT 33.1*  PLT 177    Chemistries   Recent Labs Lab 10/26/16 1651  10/28/16 0504  NA 133*  < > 134*  K 5.5*  < > 5.8*  CL 105  < > 106  CO2 19*  < > 19*  GLUCOSE 71  < > 82  BUN 38*  < > 42*  CREATININE 7.42*  < > 7.84*  CALCIUM 8.0*  < > 7.2*  AST 29  --   --   ALT 8*  --   --   ALKPHOS 218*  --   --   BILITOT 0.4  --   --   < > = values in this interval not displayed.  Microbiology Results   Recent Results (from the past 240 hour(s))  Surgical PCR screen     Status: Abnormal   Collection Time: 10/27/16 10:17 PM  Result Value Ref Range Status   MRSA, PCR NEGATIVE NEGATIVE Final   Staphylococcus aureus POSITIVE (A) NEGATIVE Final    Comment:        The Xpert SA Assay (FDA approved for NASAL specimens in patients over 43 years of age), is one component of a comprehensive surveillance program.  Test performance has been validated by [facility] for patients greater than or equal to 43 year old. It is not intended to diagnose infection nor to guide or monitor treatment.     RADIOLOGY:  No results found.   Management plans discussed with the patient, family and they are in agreement.  CODE STATUS:     Code Status Orders        Start     Ordered   10/26/16 2145  Full code  Continuous     10/26/16 2144    Code Status History    Date Active Date Inactive  Code Status Order ID Comments User Context   10/02/2016  5:09 AM 10/04/2016  7:05 PM Full Code [id number]  Arnaldo Natal, MD Inpatient   06/27/2016  8:27 PM 06/30/2016  5:15 PM Full Code [id number]  Delano Metz, MD Inpatient   03/04/2016  6:04 AM 03/07/2016  6:24 PM Full Code [id number]  Oralia Manis, MD Inpatient   02/25/2016  3:36 PM 03/01/2016  3:06 PM Full Code [id number]  Enedina Finner, MD Inpatient      TOTAL TIME TAKING CARE OF THIS PATIENT: .    Paulene Tayag M.D on 10/29/2016 at 8:21 AM  Between 7am to 6pm - Pager - [phone] After 6pm go to [email or web address] - password [email or web address] [facility]  [facility]  Office  [phone]  CC: Primary care physician; [facility] PRIMARY CARE

## 2016-10-29 NOTE — Progress Notes (Signed)
Pt ended HD tx 2 hours and 15 minutes early. States she has left shoulder ache and cannot dialyze anymore. Report to Sigmund Hazel RN.

## 2016-10-29 NOTE — Evaluation (Signed)
Physical Therapy Evaluation Patient Details Name: Erin LargeSarah B [name] MRN: [id number] DOB: 11/02/1970 Today's Date: 10/29/2016   History of Present Illness  presented to ER and admitted under observation status post mechanical fall in home environment.  L shoulder, L-spine and pelvic x-rays negative for acute injury.  Hospital course significant for R UE fistulogram (secondary to clotted access) 12/29 and persistent hyperkalemia (managed with kayexelate by nephrology); cleared for participation with session this date by Dr. Enedina FinnerSona Patel.  Clinical Impression  Upon evaluation, patient alert and oriented; follows simple commands.  Generally disinterested in therapy evaluation/ recommendations, but cooperative as needed.  Able to complete bed mobility indep; sit/stand, basic transfers and gait (220') with L HHA/cga.  Mild lateral sway, but no overt buckling or LOB.  Gait slightly guarded, likely due to baseline visual deficits; anticipate more confident gait pattern in familiar, home environment. Would benefit from skilled PT to address above deficits and promote optimal return to PLOF; Recommend transition to HHPT upon discharge from acute hospitalization for home safety assessment.     Follow Up Recommendations Home health PT    Equipment Recommendations       Recommendations for Other Services       Precautions / Restrictions Precautions Precautions: Fall Precaution Comments: Blind, R AVF (no BP R UE) Restrictions Weight Bearing Restrictions: No      Mobility  Bed Mobility Overal bed mobility: Modified Independent                Transfers Overall transfer level: Needs assistance   Transfers: Sit to/from Stand Sit to Stand: Min guard;Supervision            Ambulation/Gait Ambulation/Gait assistance: Min guard Ambulation Distance (Feet): 220 Feet Assistive device: 1 person hand held assist (due to visual deficits)       General Gait Details: reciprocal stepping  pattern with fair cadence and gait speed; mild lateral sway bilat, but no overt buckling or LOB.  anticipate more confident gait pattern in familiar, home environment where patient is aware of surroundings  Stairs            Wheelchair Mobility    Modified Rankin (Stroke Patients Only)       Balance Overall balance assessment: Needs assistance Sitting-balance support: No upper extremity supported;Feet supported Sitting balance-Leahy Scale: Good     Standing balance support: Single extremity supported Standing balance-Leahy Scale: Fair                               Pertinent Vitals/Pain Pain Assessment: Faces Faces Pain Scale: Hurts little more Pain Location: L shoulder, R hip Pain Descriptors / Indicators: Aching Pain Intervention(s): Limited activity within patient's tolerance;Monitored during session;Repositioned    Home Living Family/patient expects to be discharged to:: Private residence Living Arrangements: Alone Available Help at Discharge: Family Type of Home: House Home Access: Level entry     Home Layout: One level        Prior Function Level of Independence: Independent         Comments: Indep with ADLs, household activities; utilizes medicaid transportation services to/from dialysis.  Does endorse 1-2 falls within previous six months.     Hand Dominance        Extremity/Trunk Assessment   Upper Extremity Assessment Upper Extremity Assessment: Overall WFL for tasks assessed    Lower Extremity Assessment Lower Extremity Assessment: Overall WFL for tasks assessed (grossly at least 4/5 throughout)  Communication   Communication: No difficulties  Cognition Arousal/Alertness: Awake/alert Behavior During Therapy: Flat affect Overall Cognitive Status: Within Functional Limits for tasks assessed                 General Comments: generally disinterested in therapy assessment and recommendations    General Comments       Exercises     Assessment/Plan    PT Assessment Patient needs continued PT services  PT Problem List Decreased strength;Decreased activity tolerance;Decreased balance;Decreased mobility;Pain          PT Treatment Interventions DME instruction;Functional mobility training;Therapeutic activities;Gait training;Therapeutic exercise;Balance training;Patient/family education    PT Goals (Current goals can be found in the Care Plan section)  Acute Rehab PT Goals Patient Stated Goal: to return home PT Goal Formulation: With patient Time For Goal Achievement: 11/12/16 Potential to Achieve Goals: Fair    Frequency Min 2X/week   Barriers to discharge Decreased caregiver support      Co-evaluation               End of Session Equipment Utilized During Treatment: Gait belt Activity Tolerance: Patient tolerated treatment well Patient left: in bed;with call bell/phone within reach;with bed alarm set Nurse Communication: Mobility status         Time: 1030-1040 PT Time Calculation (min) (ACUTE ONLY): 10 min   Charges:   PT Evaluation $PT Eval Low Complexity: 1 Procedure     PT G Codes:        Devontae Casasola H. Manson Passey, PT, DPT, NCS 10/29/16, 11:23 AM [phone]

## 2016-10-29 NOTE — Progress Notes (Signed)
Start of hd 

## 2016-10-29 NOTE — Progress Notes (Signed)
[facility] - [place] at [facility]   PATIENT NAME: Delema Cohan    MR#:  [id number]  DATE OF BIRTH:  Oct 21, 1971  SUBJECTIVE:   Chronic pain  REVIEW OF SYSTEMS:   Review of Systems  Constitutional: Negative for chills, fever and weight loss.  HENT: Negative for ear discharge, ear pain and nosebleeds.   Eyes: Negative for blurred vision, pain and discharge.  Respiratory: Negative for sputum production, shortness of breath, wheezing and stridor.   Cardiovascular: Negative for chest pain, palpitations, orthopnea and PND.  Gastrointestinal: Negative for abdominal pain, diarrhea, nausea and vomiting.  Genitourinary: Negative for frequency and urgency.  Musculoskeletal: Negative for back pain and joint pain.  Neurological: Positive for weakness. Negative for sensory change, speech change and focal weakness.  Psychiatric/Behavioral: Negative for depression and hallucinations. The patient is not nervous/anxious.    Tolerating Diet:npo for procedure  DRUG ALLERGIES:   Allergies  Allergen Reactions  . Dhea [Nutritional Supplements] Anaphylaxis    Patient states medication is DHE for migraines, not DHEA  . Demerol [Meperidine] Other (See Comments)    Reaction:  Hallucinations   . Floxin [Ofloxacin] Other (See Comments)    Reaction:  Hallucinations   . Nsaids Nausea And Vomiting and Swelling  . Nubain [Nalbuphine Hcl] Other (See Comments)    Reaction:  Hallucinations   . Phenergan [Promethazine Hcl] Other (See Comments)    Reaction:  Restless legs   . Stadol [Butorphanol] Other (See Comments)    Reaction:  Hallucinations   . Erythromycin Diarrhea and Rash    VITALS:  Blood pressure 138/66, pulse (!) 52, temperature 97.7 F (36.5 C), temperature source Oral, resp. rate 18, height 4\' 10"  (1.473 m), weight 65 kg (143 lb 4.8 oz), last menstrual period 11/21/2007, SpO2 97 %.  PHYSICAL EXAMINATION:   Physical Exam  GENERAL:  45 y.o.-year-old patient  lying in the bed with no acute distress.  EYES: Pupils equal, round, reactive to light and accommodation. No scleral icterus. Extraocular muscles intact.  HEENT: Head atraumatic, normocephalic. Oropharynx and nasopharynx clear.  NECK:  Supple, no jugular venous distention. No thyroid enlargement, no tenderness.  LUNGS: Normal breath sounds bilaterally, no wheezing, rales, rhonchi. No use of accessory muscles of respiration.  CARDIOVASCULAR: S1, S2 normal. No murmurs, rubs, or gallops.  ABDOMEN: Soft, nontender, nondistended. Bowel sounds present. No organomegaly or mass.  EXTREMITIES: No cyanosis, clubbing or edema b/l.    NEUROLOGIC: Cranial nerves II through XII are intact. No focal Motor or sensory deficits b/l.   PSYCHIATRIC:  patient is alert and oriented x 3.  SKIN: No obvious rash, lesion, or ulcer.   LABORATORY PANEL:  CBC  Recent Labs Lab 10/27/16 0506  WBC 6.0  HGB 11.0*  HCT 33.1*  PLT 177    Chemistries   Recent Labs Lab 10/26/16 1651  10/28/16 0504  NA 133*  < > 134*  K 5.5*  < > 5.8*  CL 105  < > 106  CO2 19*  < > 19*  GLUCOSE 71  < > 82  BUN 38*  < > 42*  CREATININE 7.42*  < > 7.84*  CALCIUM 8.0*  < > 7.2*  AST 29  --   --   ALT 8*  --   --   ALKPHOS 218*  --   --   BILITOT 0.4  --   --   < > = values in this interval not displayed. Cardiac Enzymes  Recent Labs Lab 10/23/16 2112  TROPONINI <0.03   RADIOLOGY:  No results found. ASSESSMENT AND PLAN:   45 y.o.femalewith a history of end-stage renal disease on hemodialysis, legally blind, CVA, DVT, hyperlipidemianow being admitted with: 1. Intractable pain status post mechanical fall-admit to observation for pain control -When necessary by mouth pain meds 2. History of hyperlipidemia-continue TriCor and Mevacor  3. History of end-stage renal disease on hemodialysis-continue Florinef, hydrocortisone, Renvela, vitamin D Patient has had some issues with her fistula. She is scheduled for  fistulogram today. Patient will be dialyzed after that -Hyperkalemia treated with Kayexalate By nephrology  4. History of neuropathy and chronic pain-continue Neurontin, Topamax  5. History of hypothyroid-continue Synthroid  6. History of allergies-continue Claritin  6. History of GERD-continue Protonix  7. History of psychosis, anxiety and depression-continue risperidone, Ritalin, Zoloft, trazodone  Patient will be dialyzed after her fistulogram. Once remains stable she'll be discharged to home later on in the evening.  CONSULTS OBTAINED:    Case discussed with Care Management/Social Worker. Management plans discussed with the patient, family and they are in agreement.  CODE STATUS: full DVT Prophylaxis:heparin TOTAL TIME TAKING CARE OF THIS PATIENT: 30 minutes.  >50% time spent on counselling and coordination of care  POSSIBLE D/C today DEPENDING ON CLINICAL CONDITION.  Note: This dictation was prepared with Dragon dictation along with smaller phrase technology. Any transcriptional errors that result from this process are unintentional.  Shoichi Mielke M.D on 10/28/2016 at 8:18 AM  Between 7am to 6pm - Pager - [phone]  After 6pm go to [email or web address] - password [email or web address] [facility]  [facility]  Office  [phone]  CC: Primary care physician; [facility]

## 2016-10-29 NOTE — Progress Notes (Signed)
Patient had Dialysis this shift; meds as ordered; denies pain; discharge instructions as ordered; patient voiced understanding; waiting on Taxi for ride home; Will continue to monitor.

## 2016-10-30 LAB — HEPATITIS B SURFACE ANTIGEN: HEP B S AG: NEGATIVE

## 2016-10-31 ENCOUNTER — Emergency Department: Payer: Medicare Other

## 2016-10-31 ENCOUNTER — Emergency Department
Admission: EM | Admit: 2016-10-31 | Discharge: 2016-10-31 | Disposition: A | Discharge status: Home / Self Care | Payer: Medicare Other | Attending: Emergency Medicine | Admitting: Emergency Medicine

## 2016-10-31 ENCOUNTER — Encounter: Payer: Self-pay | Admitting: Intensive Care

## 2016-10-31 DIAGNOSIS — N186 End stage renal disease: Secondary | ICD-10-CM | POA: Insufficient documentation

## 2016-10-31 DIAGNOSIS — J45909 Unspecified asthma, uncomplicated: Secondary | ICD-10-CM | POA: Insufficient documentation

## 2016-10-31 DIAGNOSIS — W19XXXA Unspecified fall, initial encounter: Secondary | ICD-10-CM

## 2016-10-31 DIAGNOSIS — F17210 Nicotine dependence, cigarettes, uncomplicated: Secondary | ICD-10-CM | POA: Diagnosis not present

## 2016-10-31 DIAGNOSIS — Z992 Dependence on renal dialysis: Secondary | ICD-10-CM | POA: Insufficient documentation

## 2016-10-31 DIAGNOSIS — S299XXA Unspecified injury of thorax, initial encounter: Secondary | ICD-10-CM | POA: Diagnosis present

## 2016-10-31 DIAGNOSIS — Y9301 Activity, walking, marching and hiking: Secondary | ICD-10-CM | POA: Insufficient documentation

## 2016-10-31 DIAGNOSIS — Z79899 Other long term (current) drug therapy: Secondary | ICD-10-CM | POA: Diagnosis not present

## 2016-10-31 DIAGNOSIS — W010XXA Fall on same level from slipping, tripping and stumbling without subsequent striking against object, initial encounter: Secondary | ICD-10-CM | POA: Diagnosis not present

## 2016-10-31 DIAGNOSIS — M25562 Pain in left knee: Secondary | ICD-10-CM | POA: Diagnosis not present

## 2016-10-31 DIAGNOSIS — E039 Hypothyroidism, unspecified: Secondary | ICD-10-CM | POA: Diagnosis not present

## 2016-10-31 DIAGNOSIS — Y929 Unspecified place or not applicable: Secondary | ICD-10-CM | POA: Diagnosis not present

## 2016-10-31 DIAGNOSIS — R0781 Pleurodynia: Secondary | ICD-10-CM | POA: Insufficient documentation

## 2016-10-31 DIAGNOSIS — Y999 Unspecified external cause status: Secondary | ICD-10-CM | POA: Insufficient documentation

## 2016-10-31 LAB — BASIC METABOLIC PANEL
Anion gap: 12 (ref 5–15)
BUN: 39 mg/dL — ABNORMAL HIGH (ref 6–20)
CALCIUM: 6.7 mg/dL — AB (ref 8.9–10.3)
CO2: 23 mmol/L (ref 22–32)
Chloride: 101 mmol/L (ref 101–111)
Creatinine, Ser: 6.9 mg/dL — ABNORMAL HIGH (ref 0.44–1.00)
GFR, EST AFRICAN AMERICAN: 8 mL/min — AB (ref >60)
GFR, EST NON AFRICAN AMERICAN: 7 mL/min — AB (ref >60)
GLUCOSE: 69 mg/dL (ref 65–99)
POTASSIUM: 3 mmol/L — AB (ref 3.5–5.1)
SODIUM: 136 mmol/L (ref 135–145)

## 2016-10-31 LAB — GLUCOSE, CAPILLARY
GLUCOSE-CAPILLARY: 73 mg/dL (ref 65–99)
Glucose-Capillary: 61 mg/dL — ABNORMAL LOW (ref 65–99)

## 2016-10-31 MED ORDER — OXYCODONE HCL 5 MG PO TABS
5.0000 mg | ORAL_TABLET | Freq: Once | ORAL | Status: AC
  Administered 2016-10-31: 5 mg via ORAL
  Filled 2016-10-31: qty 1

## 2016-10-31 MED ORDER — HYDROMORPHONE HCL 1 MG/ML IJ SOLN
0.5000 mg | Freq: Once | INTRAMUSCULAR | Status: AC
  Administered 2016-10-31: 0.5 mg via INTRAMUSCULAR

## 2016-10-31 MED ORDER — HYDROMORPHONE HCL 1 MG/ML IJ SOLN
INTRAMUSCULAR | Status: AC
Start: 2016-10-31 — End: 2016-10-31
  Administered 2016-10-31: 0.5 mg via INTRAMUSCULAR
  Filled 2016-10-31: qty 1

## 2016-10-31 MED ORDER — OXYCODONE HCL 5 MG PO TABS: 5.0000 mg | ORAL_TABLET | Freq: Four times a day (QID) | ORAL | 0 refills | Status: DC | PRN

## 2016-10-31 NOTE — ED Notes (Signed)
Patient transported to X-ray 

## 2016-10-31 NOTE — ED Triage Notes (Addendum)
Patient arrived by EMS for fall today. Pt states "there was some packaging tape in the floor that got stuck to my foot and I tripped which caused my fall. My R side of my back hurts and my L knee" Pt is blind and lives at home alone. Home health RN visits 1-2 Xper week. Patient was seen here last week for a fall also. Pt receives dialysis Tuesday, Thursday, and Saturday.

## 2016-10-31 NOTE — ED Provider Notes (Signed)
[facility] Emergency Department Provider Note   ____________________________________________   First MD Initiated Contact with Patient 10/31/16 1526     (approximate)  I have reviewed the triage vital signs and the nursing notes.   HISTORY  Chief Complaint Fall    HPI Erin [name] is a 46 y.o. female history of blindness, end-stage renal disease and recent fall and admission to the hospital.  Patient reports today she was walking, she got caught up on a piece of packaging tape and felt backwards striking her right mid back, pointing at her right lateral posterior ribs with pain and discomfort in this area. She also reports that she fell forward the process and struck her left knee, having mild tenderness in the knee.  Denies any other symptoms. She did not strike her head. No neck pain. Did not lose consciousness. No preceding seeding symptoms. She reports her dialysis has been carried out as under its normal schedule and she is due tomorrow for another dialysis session.  No chest pain or shortness of breath except for pain along the ribs on the back right when she moves.  Patient reports that she was using oxycodone for pain, but has run out   Past Medical History:  Diagnosis Date  . Anxiety   . Asthma   . Bipolar affect, depressed (HCC)   . Blind   . Chronic headaches    Seeing Pain Management  . Constipation   . Depression   . Epilepsy (HCC)   . Fibromyalgia   . History of CVA (cerebrovascular accident)   . History of DVT (deep vein thrombosis)   . Hyperlipidemia   . Hypothyroid   . Kidney failure    Currently on Dialysis  . Narcolepsy   . 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 Psychosis     Patient Active Problem List   Diagnosis Date Noted  . Intractable pain 10/26/2016  . Chest pain 10/02/2016  . Kidney failure   . Palliative care encounter   . Goals of care,  counseling/discussion   . Hypoglycemia 06/27/2016  . Seizure disorder (HCC) 06/27/2016  . Blindness 06/27/2016  . Chronic pain 06/27/2016  . History of CVA (cerebrovascular accident) 06/27/2016  . Generalized weakness 03/04/2016  . Hypothyroidism 03/04/2016  . GERD (gastroesophageal reflux disease) 03/04/2016  . End stage renal disease on dialysis (HCC) 03/04/2016  . Bipolar 1 disorder, mixed, moderate (HCC) 02/26/2016  . 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 Anemia 02/25/2016    Past Surgical History:  Procedure Laterality Date  . BRAIN SURGERY  1976  . BREAST LUMPECTOMY  2001  . CARDIAC CATHETERIZATION Right 10/03/2016   Procedure: Coronary Angiography;  Surgeon: Laurier Nancy, MD;  Location: [facility] INVASIVE CV LAB;  Service: Cardiovascular;  Laterality: Right;  . CHOLECYSTECTOMY  2001  . DG AV DIALYSIS GRAFT DECLOT OR     X 4  . GIVENS CAPSULE STUDY N/A 02/29/2016   Procedure: GIVENS CAPSULE STUDY;  Surgeon: Elnita Maxwell, MD;  Location: [facility] ENDOSCOPY;  Service: Endoscopy;  Laterality: N/A;  . PERIPHERAL VASCULAR CATHETERIZATION N/A 08/21/2015   Procedure: Dialysis/Perma Catheter Insertion;  Surgeon: Renford Dills, MD;  Location: [facility] INVASIVE CV LAB;  Service: Cardiovascular;  Laterality: N/A;  . PERIPHERAL VASCULAR CATHETERIZATION N/A 02/29/2016   Procedure: Dialysis/Perma Catheter Removal;  Surgeon: Annice Needy, MD;  Location: [facility] INVASIVE CV LAB;  Service: Cardiovascular;  Laterality: N/A;  .  PITUITARY EXCISION  1976    Prior to Admission medications   Medication Sig Start Date End Date Taking? Authorizing Provider  estradiol (ESTRACE) 0.1 MG/GM vaginal cream Place 1 Applicatorful vaginally 2 (two) times a week. Mondays and Thursdays    Historical Provider, MD  fenofibrate (TRICOR) 145 MG tablet Take 145 mg by mouth every other day.    Historical Provider, MD  fludrocortisone (FLORINEF) 0.1 MG tablet  Take 0.1 mg by mouth daily.    Historical Provider, MD  furosemide (LASIX) 40 MG tablet Take 40 mg by mouth 2 (two) times daily.    Historical Provider, MD  gabapentin (NEURONTIN) 100 MG capsule Take 400 mg by mouth at bedtime.    Historical Provider, MD  hydrocortisone (CORTEF) 10 MG tablet Take 5-10 mg by mouth 2 (two) times daily. Pt takes 10 mg in the morning and 5 mg at bedtime.    Historical Provider, MD  levothyroxine (SYNTHROID, LEVOTHROID) 112 MCG tablet Take 112 mcg by mouth daily.     Historical Provider, MD  loratadine (CLARITIN) 10 MG tablet Take 10 mg by mouth daily.    Historical Provider, MD  lovastatin (MEVACOR) 40 MG tablet Take 20 mg by mouth every evening.     Historical Provider, MD  Melatonin 5 MG TABS Take 2 tablets by mouth at bedtime.    Historical Provider, MD  methylphenidate (RITALIN LA) 30 MG 24 hr capsule Take 30 mg by mouth every morning.    Historical Provider, MD  nystatin ointment (MYCOSTATIN) Apply 1 application topically 2 (two) times daily. Apply to affected area (groin, abdominal fold). 06/30/16   Standley Brooking, MD  ondansetron (ZOFRAN) 4 MG tablet Take 1 tablet (4 mg total) by mouth every 8 (eight) hours as needed for nausea or vomiting. Patient not taking: Reported on 10/26/2016 10/15/16   Nita Sickle, MD  oxyCODONE (OXY IR/ROXICODONE) 5 MG immediate release tablet Take 1 tablet (5 mg total) by mouth every 6 (six) hours as needed for severe pain. 10/31/16   Sharyn Creamer, MD  pantoprazole (PROTONIX) 40 MG tablet Take 1 tablet (40 mg total) by mouth daily. 02/27/16   [name], MD  risperidone (RISPERDAL) 4 MG tablet Take 2-4 mg by mouth 2 (two) times daily. Pt takes 4 mg in the morning and 2 mg at bedtime.    Historical Provider, MD  sertraline (ZOLOFT) 50 MG tablet Take 50 mg by mouth daily.    Historical Provider, MD  sevelamer carbonate (RENVELA) 800 MG tablet Take 2,400 mg by mouth 2 (two) times daily.     Historical Provider, MD  topiramate (TOPAMAX)  200 MG tablet Take 200 mg by mouth 2 (two) times daily. Pt takes with a 50 mg tablet.    Historical Provider, MD  topiramate (TOPAMAX) 50 MG tablet Take 50 mg by mouth 2 (two) times daily. Pt takes with a 200 mg tablet.    Historical Provider, MD  traZODone (DESYREL) 100 MG tablet Take 100 mg by mouth at bedtime.    Historical Provider, MD  Vitamin D, Ergocalciferol, (DRISDOL) 50000 UNITS CAPS capsule Take 50,000 Units by mouth every 30 (thirty) days. Takes on the 8th of every month    Historical Provider, MD    Allergies Dhea [nutritional supplements]; Demerol [meperidine]; Floxin [ofloxacin]; Nsaids; Nubain [nalbuphine hcl]; Phenergan [promethazine hcl]; Stadol [butorphanol]; and Erythromycin  Family History  Problem Relation Age of Onset  . Heart attack Mother   . Migraines Mother   . Aneurysm Mother   .  Diabetes Father     Social History Social History  Substance Use Topics  . Smoking status: Current Every Day Smoker    Packs/day: 0.50    Years: 16.00    Types: Cigarettes  . Smokeless tobacco: Never Used  . Alcohol use 6.0 oz/week    10 Shots of liquor per week     Comment: per patient drinks liquor twice a week    Review of Systems Constitutional: No fever/chills Eyes: Chronic blindness ENT: No sore throat. Cardiovascular: Denies chest pain. See history of present illness Respiratory: Denies shortness of breath. Gastrointestinal: No abdominal pain.  No nausea, no vomiting.  No diarrhea.  No constipation. Genitourinary: Negative for dysuria. Musculoskeletal: Negative for back pain except for tenderness over her left knee, she is able to move the knee well though reports just feels sore. No numbness tingling or weakness in the arm or leg.. Skin: Negative for rash. Neurological: Negative for headaches, focal weakness or numbness.  10-point ROS otherwise negative.  ____________________________________________   PHYSICAL EXAM:  VITAL SIGNS: ED Triage Vitals  Enc  Vitals Group     BP 10/31/16 1525 (!) 152/76     Pulse Rate 10/31/16 1525 68     Resp 10/31/16 1525 18     Temp 10/31/16 1525 97.5 F (36.4 C)     Temp Source 10/31/16 1525 Oral     SpO2 10/31/16 1525 100 %     Weight 10/31/16 1515 145 lb (65.8 kg)     Height 10/31/16 1515 4\' 10"  (1.473 m)     Head Circumference --      Peak Flow --      Pain Score 10/31/16 1516 10     Pain Loc --      Pain Edu? --      Excl. in GC? --     Constitutional: Alert and oriented. Well appearing and in no acute distress. Head: Atraumatic. Nose: No congestion/rhinnorhea. Mouth/Throat: Mucous membranes are moist.  Oropharynx non-erythematous. Neck: No stridor.  No cervical tenderness Cardiovascular: Normal rate, regular rhythm. Grossly normal heart sounds.  Good peripheral circulation. Respiratory: Normal respiratory effort.  No retractions. Lungs CTAB. Gastrointestinal: Soft and nontender. No distention.  Musculoskeletal: No lower extremity tenderness nor edema.  No joint effusions. Patient able to raise her extremity as well, does report the anterior left knee is tender, though no bruising deformity or other concern is noted. She does have modest weakness in the lower extremities bilaterally, but she reports this is chronic. No evidence of injury to the arms, greater use and range of motion. Patient is able to sit up without pain, but does report tenderness over the right posterior ribs to palpation without any deformity noted. Neurologic:  Normal speech and language. No gross focal neurologic deficits are appreciated.  Skin:  Skin is warm, dry and intact. No rash noted. Psychiatric: Mood and affect are somewhat flat, calm and appropriate.  ____________________________________________   LABS (all labs ordered are listed, but only abnormal results are displayed)  Labs Reviewed  BASIC METABOLIC PANEL - Abnormal; Notable for the following:       Result Value   Potassium 3.0 (*)    BUN 39 (*)     Creatinine, Ser 6.90 (*)    Calcium 6.7 (*)    GFR calc non Af Amer 7 (*)    GFR calc Af Amer 8 (*)    All other components within normal limits  GLUCOSE, CAPILLARY - Abnormal; Notable for the following:  Glucose-Capillary 61 (*)    All other components within normal limits  GLUCOSE, CAPILLARY  CBG MONITORING, ED   ____________________________________________  EKG   ____________________________________________  RADIOLOGY  Dg Ribs Unilateral W/chest Right  Result Date: 10/31/2016 CLINICAL DATA:  Larey Seat and injured right chest. EXAM: RIGHT RIBS AND CHEST - 3+ VIEW COMPARISON:  Chest x-ray 10/26/2016 FINDINGS: The heart is mildly enlarged but stable. There is tortuosity of the thoracic aorta. The lungs are clear. No pleural effusion or pneumothorax. Dedicated views of the right ribs demonstrate a nondisplaced fracture of the right anterior eighth rib. Possible nondisplaced 9 rib fracture also. IMPRESSION: Nondisplaced right eighth rib fracture and possible ninth rib fracture also. Stable cardiac enlargement but no acute pulmonary findings. Electronically Signed   By: Rudie Meyer M.D.   On: 10/31/2016 17:29   Dg Knee 2 Views Left  Result Date: 10/31/2016 CLINICAL DATA:  Pt states, "there was some packaging tape in the floor that got stuck to my foot and I tripped which caused my fall." Pt complains of right posterior rib pain and anterior left knee pain. Pt states she has previous rib fractures EXAM: LEFT KNEE - 1-2 VIEW COMPARISON:  05/09/2016 FINDINGS: No fracture of the proximal tibia or distal femur. Patella is normal. No joint effusion. Vascular graft noted in the medial thigh IMPRESSION: No fracture or dislocation. Electronically Signed   By: Genevive Bi M.D.   On: 10/31/2016 17:29   Ct Chest Wo Contrast  Result Date: 10/31/2016 CLINICAL DATA:  Tripped and fell today with trauma to the right side of the back. EXAM: CT CHEST WITHOUT CONTRAST TECHNIQUE: Multidetector CT imaging of  the chest was performed following the standard protocol without IV contrast. COMPARISON:  Chest radiography same day. CT 10/15/2016. CT 06/04/2016 FINDINGS: Cardiovascular: Pericardial effusion is slightly larger than was seen on the previous study. Some coronary artery calcification is noted. Some aortic atherosclerotic calcification is noted. Mediastinum/Nodes: No sign of adenopathy or mass without contrast. Previously, inferior right hilar and subcarinal nodes were noted. I do not suspect that there has been enlargement. Lungs/Pleura: No new pneumothorax or hemothorax. No pleural effusion. No active pulmonary disease. Subpleural nodule in the right middle lobe image 54 is unchanged from previous studies. Upper Abdomen: Negative Musculoskeletal: Old healed rib fractures on the right. No acute rib fracture seen. Old compression fracture at T6 with vertebra plana. No change from previous studies. IMPRESSION: No acute or traumatic finding. Old healed or healing rib fractures on the right. Old T6 compression fracture with vertebra plana. Small pericardial effusion, slightly larger than on the previous study. Electronically Signed   By: Paulina Fusi M.D.   On: 10/31/2016 18:39    ____________________________________________   PROCEDURES  Procedure(s) performed: None  Procedures  Critical Care performed: No  ____________________________________________   INITIAL IMPRESSION / ASSESSMENT AND PLAN / ED COURSE  Pertinent labs & imaging results that were available during my care of the patient were reviewed by me and considered in my medical decision making (see chart for details).  Patient reports tripping, mechanical fall. No evidence of an obvious acute injury but she does report pain over the right posterior ribs, as well as left knee. She is a dialysis patient reports recent compliance with the plan for dialysis tomorrow, I will check basic labs to assure no obvious or acute metabolic abnormality.  No cardiac or pulmonary symptoms.  The patient's is on mechanical fall. Patient has a history of frequent falls. Glucose initially slightly low,  patient able to take juice by mouth without issue and is not complaining of any symptomatology associated with hypoglycemia and reports she has chronic low blood sugars.  After receiving pain medication patient feels much better. Discussed with her and she will be discharged home, patient comfortable this plan. We'll provide a very brief prescription of oxycodone for pain relief, and she will follow-up with her normal dialysis and follow-up care tomorrow.  Clinical Course      ____________________________________________   FINAL CLINICAL IMPRESSION(S) / ED DIAGNOSES  Final diagnoses:  Fall      NEW MEDICATIONS STARTED DURING THIS VISIT:  Discharge Medication List as of 10/31/2016  7:00 PM    START taking these medications   Details  oxyCODONE (OXY IR/ROXICODONE) 5 MG immediate release tablet Take 1 tablet (5 mg total) by mouth every 6 (six) hours as needed for severe pain., Starting Mon 10/31/2016, Print         Note:  This document was prepared using Dragon voice recognition software and may include unintentional dictation errors.     Sharyn Creamer, MD 10/31/16 2113

## 2016-10-31 NOTE — Discharge Instructions (Signed)

## 2016-10-31 NOTE — ED Notes (Signed)
Patient transported to CT 

## 2016-11-01 ENCOUNTER — Encounter: Payer: Self-pay | Admitting: Vascular Surgery

## 2016-11-05 ENCOUNTER — Encounter: Payer: Self-pay | Admitting: Emergency Medicine

## 2016-11-05 DIAGNOSIS — E039 Hypothyroidism, unspecified: Secondary | ICD-10-CM | POA: Insufficient documentation

## 2016-11-05 DIAGNOSIS — F17210 Nicotine dependence, cigarettes, uncomplicated: Secondary | ICD-10-CM | POA: Diagnosis not present

## 2016-11-05 DIAGNOSIS — J45909 Unspecified asthma, uncomplicated: Secondary | ICD-10-CM | POA: Diagnosis not present

## 2016-11-05 DIAGNOSIS — Z992 Dependence on renal dialysis: Secondary | ICD-10-CM | POA: Insufficient documentation

## 2016-11-05 DIAGNOSIS — R0789 Other chest pain: Secondary | ICD-10-CM | POA: Insufficient documentation

## 2016-11-05 DIAGNOSIS — Z79899 Other long term (current) drug therapy: Secondary | ICD-10-CM | POA: Diagnosis not present

## 2016-11-05 NOTE — ED Triage Notes (Signed)
Pt comes to triage via [facility] with c/o left sided chest pain. Pt denies N/V or diaphoresis. Pt is in NAD at this time.

## 2016-11-05 NOTE — ED Notes (Signed)
Per MD Huel Cote, hold on DG chest and labs until pt has been verified by physician.

## 2016-11-06 ENCOUNTER — Emergency Department
Admission: EM | Admit: 2016-11-06 | Discharge: 2016-11-06 | Disposition: A | Discharge status: Home / Self Care | Payer: Medicare Other | Attending: Emergency Medicine | Admitting: Emergency Medicine

## 2016-11-06 DIAGNOSIS — R079 Chest pain, unspecified: Secondary | ICD-10-CM

## 2016-11-06 LAB — CBC WITH DIFFERENTIAL/PLATELET
BASOS PCT: 2 %
Basophils Absolute: 0.1 10*3/uL (ref 0–0.1)
Eosinophils Absolute: 0.5 10*3/uL (ref 0–0.7)
Eosinophils Relative: 8 %
HCT: 37.8 % (ref 35.0–47.0)
HEMOGLOBIN: 12.5 g/dL (ref 12.0–16.0)
LYMPHS ABS: 1.9 10*3/uL (ref 1.0–3.6)
Lymphocytes Relative: 32 %
MCH: 30.9 pg (ref 26.0–34.0)
MCHC: 33 g/dL (ref 32.0–36.0)
MCV: 93.5 fL (ref 80.0–100.0)
Monocytes Absolute: 0.5 10*3/uL (ref 0.2–0.9)
Monocytes Relative: 8 %
NEUTROS PCT: 50 %
Neutro Abs: 3 10*3/uL (ref 1.4–6.5)
Platelets: 166 10*3/uL (ref 150–440)
RBC: 4.04 MIL/uL (ref 3.80–5.20)
RDW: 18.8 % — ABNORMAL HIGH (ref 11.5–14.5)
WBC: 6 10*3/uL (ref 3.6–11.0)

## 2016-11-06 LAB — COMPREHENSIVE METABOLIC PANEL
ALBUMIN: 3.9 g/dL (ref 3.5–5.0)
ALK PHOS: 137 U/L — AB (ref 38–126)
AST: 32 U/L (ref 15–41)
Anion gap: 12 (ref 5–15)
BUN: 46 mg/dL — ABNORMAL HIGH (ref 6–20)
CALCIUM: 8.7 mg/dL — AB (ref 8.9–10.3)
CO2: 22 mmol/L (ref 22–32)
Chloride: 100 mmol/L — ABNORMAL LOW (ref 101–111)
Creatinine, Ser: 6.5 mg/dL — ABNORMAL HIGH (ref 0.44–1.00)
GFR calc Af Amer: 8 mL/min — ABNORMAL LOW (ref >60)
GFR calc non Af Amer: 7 mL/min — ABNORMAL LOW (ref >60)
GLUCOSE: 53 mg/dL — AB (ref 65–99)
Potassium: 5.6 mmol/L — ABNORMAL HIGH (ref 3.5–5.1)
SODIUM: 134 mmol/L — AB (ref 135–145)
Total Bilirubin: 0.6 mg/dL (ref 0.3–1.2)
Total Protein: 6.9 g/dL (ref 6.5–8.1)

## 2016-11-06 LAB — TROPONIN I: Troponin I: <0.03 ng/mL (ref <0.03)

## 2016-11-06 MED ORDER — ACETAMINOPHEN 325 MG PO TABS
650.0000 mg | ORAL_TABLET | Freq: Once | ORAL | Status: AC
  Administered 2016-11-06: 650 mg via ORAL
  Filled 2016-11-06: qty 2

## 2016-11-06 NOTE — ED Notes (Signed)
Pt escorted to lobby via wheelchair to wait for ride.

## 2016-11-06 NOTE — Discharge Instructions (Signed)
Continue with your dialysis treatment and discuss the continued pain with your nephrologist. Laboratory work, recent chest CT, and heart catheterization all been within normal limits and there is no other further emergency issues to be investigated at this time.  Please return immediately if condition worsens. Please contact her primary physician or the physician you were given for referral. If you have any specialist physicians involved in her treatment and plan please also contact them. Thank you for using [facility].

## 2016-11-06 NOTE — ED Notes (Signed)
Pt given phone to call for ride.

## 2016-11-06 NOTE — ED Notes (Signed)
Pt bundled up in covers asleep. Nurse wakes pt again asking her to call for ride.

## 2016-11-06 NOTE — ED Notes (Signed)
Pt states left sided chest pain that is intermittent for "months". Pt states pain feels "worse" today. Pt denies nausea, states she has a headache. Pt denies nausea, shob, diaphoresis. Pt appears in no acute distress.

## 2016-11-06 NOTE — ED Notes (Signed)
Spoke with dr. Huel Cote regarding placing pt on cardiac monitor. Pt is reluctant to have monitor placed. md states no need to place pt on cardiac monitor at this time.

## 2016-11-06 NOTE — ED Notes (Signed)
Lab in to attempt venipuncture. 

## 2016-11-06 NOTE — ED Provider Notes (Signed)
Time Seen: Approximately 0514*  I have reviewed the triage notes  Chief Complaint: Chest Pain   History of Present Illness: AEMILIA [name] is a 46 y.o. female who has a history of chronic pain and is here frequently for evaluations of chest discomfort and shortness of breath. Had a recent heart catheterization beginning of December with no obvious acute coronary artery disease and also had a chest CT done recently on January 1. The patient is describing some left upper chest discomfort without any associated symptoms. She arrives by EMS uneventfully. His history of renal failure and normally gets her dialysis on Saturday Tuesdays and Thursdays. She states she had a full dialysis treatment yesterday and denies any current shortness of breath though she states it will hurt when she takes a deep breath. She points to a localized area in the left upper chest region. She denies any arm job back or flank pain. She denies any positional component   Past Medical History:  Diagnosis Date  . Anxiety   . Asthma   . Bipolar affect, depressed (HCC)   . Blind   . Chronic headaches    Seeing Pain Management  . Constipation   . Depression   . Epilepsy (HCC)   . Fibromyalgia   . History of CVA (cerebrovascular accident)   . History of DVT (deep vein thrombosis)   . Hyperlipidemia   . Hypothyroid   . Kidney failure    Currently on Dialysis  . Narcolepsy   . 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 Psychosis     Patient Active Problem List   Diagnosis Date Noted  . Intractable pain 10/26/2016  . Chest pain 10/02/2016  . Kidney failure   . Palliative care encounter   . Goals of care, counseling/discussion   . Hypoglycemia 06/27/2016  . Seizure disorder (HCC) 06/27/2016  . Blindness 06/27/2016  . Chronic pain 06/27/2016  . History of CVA (cerebrovascular accident) 06/27/2016  . Generalized weakness 03/04/2016  . Hypothyroidism 03/04/2016  .  GERD (gastroesophageal reflux disease) 03/04/2016  . End stage renal disease on dialysis (HCC) 03/04/2016  . Bipolar 1 disorder, mixed, moderate (HCC) 02/26/2016  . 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 Anemia 02/25/2016    Past Surgical History:  Procedure Laterality Date  . BRAIN SURGERY  1976  . BREAST LUMPECTOMY  2001  . CARDIAC CATHETERIZATION Right 10/03/2016   Procedure: Coronary Angiography;  Surgeon: Laurier Nancy, MD;  Location: [facility] INVASIVE CV LAB;  Service: Cardiovascular;  Laterality: Right;  . CHOLECYSTECTOMY  2001  . DG AV DIALYSIS GRAFT DECLOT OR     X 4  . GIVENS CAPSULE STUDY N/A 02/29/2016   Procedure: GIVENS CAPSULE STUDY;  Surgeon: Elnita Maxwell, MD;  Location: [facility] ENDOSCOPY;  Service: Endoscopy;  Laterality: N/A;  . PERIPHERAL VASCULAR CATHETERIZATION N/A 08/21/2015   Procedure: Dialysis/Perma Catheter Insertion;  Surgeon: Renford Dills, MD;  Location: [facility] INVASIVE CV LAB;  Service: Cardiovascular;  Laterality: N/A;  . PERIPHERAL VASCULAR CATHETERIZATION N/A 02/29/2016   Procedure: Dialysis/Perma Catheter Removal;  Surgeon: Annice Needy, MD;  Location: [facility] INVASIVE CV LAB;  Service: Cardiovascular;  Laterality: N/A;  . PERIPHERAL VASCULAR CATHETERIZATION N/A 10/28/2016   Procedure: A/V Fistulagram;  Surgeon: Renford Dills, MD;  Location: [facility] INVASIVE CV LAB;  Service: Cardiovascular;  Laterality: N/A;  . PERIPHERAL VASCULAR CATHETERIZATION N/A 10/28/2016   Procedure: A/V Shunt Intervention;  Surgeon: Renford Dills,  MD;  Location: [facility] INVASIVE CV LAB;  Service: Cardiovascular;  Laterality: N/A;  . PITUITARY EXCISION  1976    Past Surgical History:  Procedure Laterality Date  . BRAIN SURGERY  1976  . BREAST LUMPECTOMY  2001  . CARDIAC CATHETERIZATION Right 10/03/2016   Procedure: Coronary Angiography;  Surgeon: Laurier Nancy, MD;  Location: [facility] INVASIVE CV LAB;  Service:  Cardiovascular;  Laterality: Right;  . CHOLECYSTECTOMY  2001  . DG AV DIALYSIS GRAFT DECLOT OR     X 4  . GIVENS CAPSULE STUDY N/A 02/29/2016   Procedure: GIVENS CAPSULE STUDY;  Surgeon: Elnita Maxwell, MD;  Location: [facility] ENDOSCOPY;  Service: Endoscopy;  Laterality: N/A;  . PERIPHERAL VASCULAR CATHETERIZATION N/A 08/21/2015   Procedure: Dialysis/Perma Catheter Insertion;  Surgeon: Renford Dills, MD;  Location: [facility] INVASIVE CV LAB;  Service: Cardiovascular;  Laterality: N/A;  . PERIPHERAL VASCULAR CATHETERIZATION N/A 02/29/2016   Procedure: Dialysis/Perma Catheter Removal;  Surgeon: Annice Needy, MD;  Location: [facility] INVASIVE CV LAB;  Service: Cardiovascular;  Laterality: N/A;  . PERIPHERAL VASCULAR CATHETERIZATION N/A 10/28/2016   Procedure: A/V Fistulagram;  Surgeon: Renford Dills, MD;  Location: [facility] INVASIVE CV LAB;  Service: Cardiovascular;  Laterality: N/A;  . PERIPHERAL VASCULAR CATHETERIZATION N/A 10/28/2016   Procedure: A/V Shunt Intervention;  Surgeon: Renford Dills, MD;  Location: [facility] INVASIVE CV LAB;  Service: Cardiovascular;  Laterality: N/A;  . PITUITARY EXCISION  1976    Current Outpatient Rx  . Order #: [id number] Class: Historical Med  . Order #: [id number] Class: Historical Med  . Order #: [id number] Class: Historical Med  . Order #: [id number] Class: Historical Med  . Order #: [id number] Class: Historical Med  . Order #: [id number] Class: Historical Med  . Order #: [id number] Class: Historical Med  . Order #: [id number] Class: Historical Med  . Order #: [id number] Class: Historical Med  . Order #: [id number] Class: Historical Med  . Order #: [id number] Class: Historical Med  . Order #: [id number] Class: Print  . Order #: [id number] Class: Print  . Order #: [id number] Class: Print  . Order #: [id number] Class: Normal  . Order #: [id number] Class: Historical Med  . Order #: [id number] Class: Historical Med  . Order #: [id number] Class: Historical Med  . Order #: [id number] Class:  Historical Med  . Order #: [id number] Class: Historical Med  . Order #: [id number] Class: Historical Med  . Order #: [id number] Class: Historical Med    Allergies:  Dhea [nutritional supplements]; Demerol [meperidine]; Floxin [ofloxacin]; Nsaids; Nubain [nalbuphine hcl]; Phenergan [promethazine hcl]; Stadol [butorphanol]; and Erythromycin  Family History: Family History  Problem Relation Age of Onset  . Heart attack Mother   . Migraines Mother   . Aneurysm Mother   . Diabetes Father     Social History: Social History  Substance Use Topics  . Smoking status: Current Every Day Smoker    Packs/day: 0.50    Years: 16.00    Types: Cigarettes  . Smokeless tobacco: Never Used  . Alcohol use 6.0 oz/week    10 Shots of liquor per week     Comment: per patient drinks liquor twice a week     Review of Systems:   10 point review of systems was performed and was otherwise negative:  Constitutional: No fever Eyes: Patient is a rebound and describes no new eye discomfort ENT: No sore throat, ear pain Cardiac: Chest pain described above without radiation Respiratory: No shortness of breath, wheezing, or stridor Abdomen: No abdominal pain, no vomiting, No diarrhea Endocrine: No weight loss, No night sweats Extremities: No  peripheral edema, cyanosis Skin: No rashes, easy bruising Neurologic: No focal weakness, trouble with speech or swollowing Urologic: Patient receives dialysis   Physical Exam:  ED Triage Vitals  Enc Vitals Group     BP 11/05/16 2323 139/73     Pulse Rate 11/05/16 2323 75     Resp 11/05/16 2323 16     Temp 11/05/16 2323 98.1 F (36.7 C)     Temp Source 11/05/16 2323 Oral     SpO2 11/05/16 2323 100 %     Weight 11/05/16 2324 147 lb (66.7 kg)     Height 11/05/16 2324 4\' 10"  (1.473 m)     Head Circumference --      Peak Flow --      Pain Score 11/05/16 2325 10     Pain Loc --      Pain Edu? --      Excl. in GC? --     General: Awake , Alert , and Oriented  times 3; GCS 15 Head: Normal cephalic , atraumatic Eyes: Nystagmus  Nose/Throat: No nasal drainage, patent upper airway without erythema or exudate.  Neck: Supple, Full range of motion, No anterior adenopathy or palpable thyroid masses Lungs: Clear to ascultation without wheezes , rhonchi, or rales Heart: Regular rate, regular rhythm without murmurs , gallops , or rubs Abdomen: Soft, non tender without rebound, guarding , or rigidity; bowel sounds positive and symmetric in all 4 quadrants. No organomegaly .        Extremities: 2 plus symmetric pulses. No edema, clubbing or cyanosis Neurologic: normal ambulation, Motor symmetric without deficits, sensory intact Skin: warm, dry, no rashes   Labs:   All laboratory work was reviewed including any pertinent negatives or positives listed below:  Labs Reviewed  CBC WITH DIFFERENTIAL/PLATELET - Abnormal; Notable for the following:       Result Value   RDW 18.8 (*)    All other components within normal limits  COMPREHENSIVE METABOLIC PANEL - Abnormal; Notable for the following:    Sodium 134 (*)    Potassium 5.6 (*)    Chloride 100 (*)    Glucose, Bld 53 (*)    BUN 46 (*)    Creatinine, Ser 6.50 (*)    Calcium 8.7 (*)    ALT <5 (*)    Alkaline Phosphatase 137 (*)    GFR calc non Af Amer 7 (*)    GFR calc Af Amer 8 (*)    All other components within normal limits  TROPONIN I  Normal laboratory work findings for the patient  EKG: * ED ECG REPORT I, Jennye Moccasin, the attending physician, personally viewed and interpreted this ECG.  Date: 11/06/2016 EKG Time: 2332 Rate:75 Rhythm: normal sinus rhythm QRS Axis: normal Intervals: normal ST/T Wave abnormalities: Nonspecific T wave abnormalities Conduction Disturbances: none Narrative Interpretation: unremarkable No acute ischemic changes   ED Course: * Patient was sleeping through most of her stay here in emergency department does not appear to be in any apparent  distress Patient's stay here was uneventful and the patient is otherwise hemodynamically stable. She continues to request Percocet for pain. I advised her we did not have any clinical evidence that would require Percocet for pain control. Did have any life-threatening cause for her chest discomfort which based on her record is been occurring now for a significant period of time Clinical Course      Assessment: Acute unspecified chest pain   Final Clinical Impression:  Final diagnoses:  Chest pain, unspecified type     Plan: * Outpatient Patient was advised to return immediately if condition worsens. Patient was advised to follow up with their primary care physician or other specialized physicians involved in their outpatient care. The patient and/or family member/power of attorney had laboratory results reviewed at the bedside. All questions and concerns were addressed and appropriate discharge instructions were distributed by the nursing staff.            Jennye Moccasin, MD 11/06/16 [phone]

## 2016-11-06 NOTE — ED Notes (Signed)
Report to denia, rn.  

## 2016-11-06 NOTE — ED Notes (Signed)
This rn attempt venipuncture without success. Erin [name] in to attempt venipuncture. Pt refusing to allow rns to attempt anymore at this time. Lab called at pt request for assistance.

## 2016-11-08 ENCOUNTER — Encounter (HOSPITAL_COMMUNITY)
Admission: AD | Disposition: A | Discharge status: Home / Self Care | Payer: Self-pay | Source: Other Acute Inpatient Hospital | Attending: Cardiology

## 2016-11-08 ENCOUNTER — Inpatient Hospital Stay (HOSPITAL_COMMUNITY): Payer: Medicare Other | Admitting: Certified Registered Nurse Anesthetist

## 2016-11-08 ENCOUNTER — Emergency Department: Payer: Medicare Other

## 2016-11-08 ENCOUNTER — Inpatient Hospital Stay (HOSPITAL_COMMUNITY)
Admission: AD | Admit: 2016-11-08 | Discharge: 2016-11-14 | DRG: 314 | Disposition: A | Discharge status: Home / Self Care | Payer: Medicare Other | Source: Other Acute Inpatient Hospital | Attending: Cardiology | Admitting: Cardiology

## 2016-11-08 ENCOUNTER — Inpatient Hospital Stay (HOSPITAL_COMMUNITY): Payer: Medicare Other

## 2016-11-08 ENCOUNTER — Encounter: Payer: Self-pay | Admitting: Medical Oncology

## 2016-11-08 ENCOUNTER — Emergency Department
Admission: EM | Admit: 2016-11-08 | Discharge: 2016-11-08 | Disposition: A | Discharge status: Other Acute Inpatient Hospital | Payer: Medicare Other | Attending: Emergency Medicine | Admitting: Emergency Medicine

## 2016-11-08 ENCOUNTER — Emergency Department
Admit: 2016-11-08 | Discharge: 2016-11-08 | Disposition: A | Discharge status: Home / Self Care | Payer: Medicare Other | Attending: Emergency Medicine | Admitting: Emergency Medicine

## 2016-11-08 DIAGNOSIS — Z8639 Personal history of other endocrine, nutritional and metabolic disease: Secondary | ICD-10-CM

## 2016-11-08 DIAGNOSIS — E8889 Other specified metabolic disorders: Secondary | ICD-10-CM | POA: Diagnosis present

## 2016-11-08 DIAGNOSIS — R0602 Shortness of breath: Secondary | ICD-10-CM | POA: Diagnosis not present

## 2016-11-08 DIAGNOSIS — R570 Cardiogenic shock: Secondary | ICD-10-CM | POA: Diagnosis not present

## 2016-11-08 DIAGNOSIS — Z79899 Other long term (current) drug therapy: Secondary | ICD-10-CM

## 2016-11-08 DIAGNOSIS — F419 Anxiety disorder, unspecified: Secondary | ICD-10-CM | POA: Diagnosis not present

## 2016-11-08 DIAGNOSIS — E785 Hyperlipidemia, unspecified: Secondary | ICD-10-CM | POA: Diagnosis present

## 2016-11-08 DIAGNOSIS — E669 Obesity, unspecified: Secondary | ICD-10-CM | POA: Diagnosis not present

## 2016-11-08 DIAGNOSIS — R079 Chest pain, unspecified: Secondary | ICD-10-CM

## 2016-11-08 DIAGNOSIS — R579 Shock, unspecified: Secondary | ICD-10-CM | POA: Diagnosis present

## 2016-11-08 DIAGNOSIS — J45909 Unspecified asthma, uncomplicated: Secondary | ICD-10-CM | POA: Diagnosis not present

## 2016-11-08 DIAGNOSIS — I959 Hypotension, unspecified: Secondary | ICD-10-CM | POA: Diagnosis present

## 2016-11-08 DIAGNOSIS — J90 Pleural effusion, not elsewhere classified: Secondary | ICD-10-CM | POA: Diagnosis present

## 2016-11-08 DIAGNOSIS — F3160 Bipolar disorder, current episode mixed, unspecified: Secondary | ICD-10-CM | POA: Diagnosis present

## 2016-11-08 DIAGNOSIS — R8299 Other abnormal findings in urine: Secondary | ICD-10-CM | POA: Insufficient documentation

## 2016-11-08 DIAGNOSIS — D631 Anemia in chronic kidney disease: Secondary | ICD-10-CM | POA: Diagnosis present

## 2016-11-08 DIAGNOSIS — H548 Legal blindness, as defined in USA: Secondary | ICD-10-CM | POA: Diagnosis not present

## 2016-11-08 DIAGNOSIS — R000 Tachycardia, unspecified: Secondary | ICD-10-CM | POA: Diagnosis not present

## 2016-11-08 DIAGNOSIS — Z8673 Personal history of transient ischemic attack (TIA), and cerebral infarction without residual deficits: Secondary | ICD-10-CM

## 2016-11-08 DIAGNOSIS — Z79890 Hormone replacement therapy (postmenopausal): Secondary | ICD-10-CM

## 2016-11-08 DIAGNOSIS — E871 Hypo-osmolality and hyponatremia: Secondary | ICD-10-CM | POA: Diagnosis present

## 2016-11-08 DIAGNOSIS — I313 Pericardial effusion (noninflammatory): Secondary | ICD-10-CM | POA: Diagnosis not present

## 2016-11-08 DIAGNOSIS — Z7952 Long term (current) use of systemic steroids: Secondary | ICD-10-CM

## 2016-11-08 DIAGNOSIS — Z6831 Body mass index (BMI) 31.0-31.9, adult: Secondary | ICD-10-CM

## 2016-11-08 DIAGNOSIS — E209 Hypoparathyroidism, unspecified: Secondary | ICD-10-CM | POA: Diagnosis not present

## 2016-11-08 DIAGNOSIS — I9589 Other hypotension: Secondary | ICD-10-CM | POA: Diagnosis not present

## 2016-11-08 DIAGNOSIS — F17210 Nicotine dependence, cigarettes, uncomplicated: Secondary | ICD-10-CM | POA: Diagnosis present

## 2016-11-08 DIAGNOSIS — E861 Hypovolemia: Secondary | ICD-10-CM | POA: Diagnosis present

## 2016-11-08 DIAGNOSIS — Z79891 Long term (current) use of opiate analgesic: Secondary | ICD-10-CM

## 2016-11-08 DIAGNOSIS — Z8249 Family history of ischemic heart disease and other diseases of the circulatory system: Secondary | ICD-10-CM

## 2016-11-08 DIAGNOSIS — G40909 Epilepsy, unspecified, not intractable, without status epilepticus: Secondary | ICD-10-CM | POA: Diagnosis present

## 2016-11-08 DIAGNOSIS — Z886 Allergy status to analgesic agent status: Secondary | ICD-10-CM

## 2016-11-08 DIAGNOSIS — M797 Fibromyalgia: Secondary | ICD-10-CM | POA: Diagnosis not present

## 2016-11-08 DIAGNOSIS — E039 Hypothyroidism, unspecified: Secondary | ICD-10-CM | POA: Diagnosis not present

## 2016-11-08 DIAGNOSIS — Z86718 Personal history of other venous thrombosis and embolism: Secondary | ICD-10-CM

## 2016-11-08 DIAGNOSIS — Z888 Allergy status to other drugs, medicaments and biological substances status: Secondary | ICD-10-CM

## 2016-11-08 DIAGNOSIS — G47419 Narcolepsy without cataplexy: Secondary | ICD-10-CM | POA: Diagnosis present

## 2016-11-08 DIAGNOSIS — Z881 Allergy status to other antibiotic agents status: Secondary | ICD-10-CM

## 2016-11-08 DIAGNOSIS — G8929 Other chronic pain: Secondary | ICD-10-CM | POA: Diagnosis not present

## 2016-11-08 DIAGNOSIS — Z885 Allergy status to narcotic agent status: Secondary | ICD-10-CM

## 2016-11-08 DIAGNOSIS — R51 Headache: Secondary | ICD-10-CM | POA: Diagnosis present

## 2016-11-08 DIAGNOSIS — I309 Acute pericarditis, unspecified: Secondary | ICD-10-CM | POA: Diagnosis not present

## 2016-11-08 DIAGNOSIS — D649 Anemia, unspecified: Secondary | ICD-10-CM | POA: Diagnosis present

## 2016-11-08 DIAGNOSIS — I314 Cardiac tamponade: Secondary | ICD-10-CM | POA: Diagnosis not present

## 2016-11-08 DIAGNOSIS — N186 End stage renal disease: Secondary | ICD-10-CM | POA: Diagnosis not present

## 2016-11-08 DIAGNOSIS — Z992 Dependence on renal dialysis: Secondary | ICD-10-CM

## 2016-11-08 DIAGNOSIS — E230 Hypopituitarism: Secondary | ICD-10-CM | POA: Diagnosis present

## 2016-11-08 DIAGNOSIS — H547 Unspecified visual loss: Secondary | ICD-10-CM

## 2016-11-08 DIAGNOSIS — F3162 Bipolar disorder, current episode mixed, moderate: Secondary | ICD-10-CM | POA: Diagnosis present

## 2016-11-08 DIAGNOSIS — R0781 Pleurodynia: Secondary | ICD-10-CM | POA: Diagnosis present

## 2016-11-08 HISTORY — PX: CARDIAC CATHETERIZATION: SHX172

## 2016-11-08 HISTORY — DX: Disorder of kidney and ureter, unspecified: N28.9

## 2016-11-08 HISTORY — DX: Unspecified chronic bronchitis: J42

## 2016-11-08 HISTORY — DX: Other chronic pain: G89.29

## 2016-11-08 HISTORY — DX: Pneumonia, unspecified organism: J18.9

## 2016-11-08 HISTORY — DX: Cerebral infarction, unspecified: I63.9

## 2016-11-08 HISTORY — DX: Anemia, unspecified: D64.9

## 2016-11-08 HISTORY — DX: Other pericardial effusion (noninflammatory): I31.39

## 2016-11-08 HISTORY — DX: Personal history of other diseases of the digestive system: Z87.19

## 2016-11-08 HISTORY — DX: Personal history of other medical treatment: Z92.89

## 2016-11-08 HISTORY — DX: Cardiac tamponade: I31.4

## 2016-11-08 HISTORY — DX: Gastro-esophageal reflux disease without esophagitis: K21.9

## 2016-11-08 HISTORY — DX: End stage renal disease: N18.6

## 2016-11-08 HISTORY — DX: Acute myocardial infarction, unspecified: I21.9

## 2016-11-08 HISTORY — DX: Unspecified osteoarthritis, unspecified site: M19.90

## 2016-11-08 HISTORY — DX: Personal history of urinary calculi: Z87.442

## 2016-11-08 HISTORY — DX: Personal history of other benign neoplasm: Z86.018

## 2016-11-08 HISTORY — DX: Pericardial effusion (noninflammatory): I31.3

## 2016-11-08 HISTORY — PX: PERICARDIOCENTESIS: SHX2215

## 2016-11-08 HISTORY — DX: Unspecified adrenocortical insufficiency: E27.40

## 2016-11-08 HISTORY — DX: Dorsalgia, unspecified: M54.9

## 2016-11-08 HISTORY — DX: Dependence on renal dialysis: Z99.2

## 2016-11-08 HISTORY — DX: Cardiac murmur, unspecified: R01.1

## 2016-11-08 LAB — COMPREHENSIVE METABOLIC PANEL
ALT: <5 U/L — ABNORMAL LOW (ref 14–54)
ANION GAP: 13 (ref 5–15)
AST: 27 U/L (ref 15–41)
Albumin: 2.9 g/dL — ABNORMAL LOW (ref 3.5–5.0)
Alkaline Phosphatase: 125 U/L (ref 38–126)
BUN: 54 mg/dL — ABNORMAL HIGH (ref 6–20)
CHLORIDE: 102 mmol/L (ref 101–111)
CO2: 18 mmol/L — ABNORMAL LOW (ref 22–32)
CREATININE: 8.67 mg/dL — AB (ref 0.44–1.00)
Calcium: 7.5 mg/dL — ABNORMAL LOW (ref 8.9–10.3)
GFR, EST AFRICAN AMERICAN: 6 mL/min — AB (ref >60)
GFR, EST NON AFRICAN AMERICAN: 5 mL/min — AB (ref >60)
Glucose, Bld: 107 mg/dL — ABNORMAL HIGH (ref 65–99)
POTASSIUM: 4.2 mmol/L (ref 3.5–5.1)
Sodium: 133 mmol/L — ABNORMAL LOW (ref 135–145)
Total Bilirubin: 0.5 mg/dL (ref 0.3–1.2)
Total Protein: 5.6 g/dL — ABNORMAL LOW (ref 6.5–8.1)

## 2016-11-08 LAB — CBC WITH DIFFERENTIAL/PLATELET
BASOS ABS: 0 10*3/uL (ref 0.0–0.1)
BASOS PCT: 0 %
Basophils Absolute: 0.1 10*3/uL (ref 0–0.1)
Basophils Relative: 1 %
EOS ABS: 0.3 10*3/uL (ref 0–0.7)
Eosinophils Absolute: 0 10*3/uL (ref 0.0–0.7)
Eosinophils Relative: 2 %
Eosinophils Relative: 0 %
HCT: 35.5 % (ref 35.0–47.0)
HEMATOCRIT: 32.5 % — AB (ref 36.0–46.0)
HEMOGLOBIN: 11.5 g/dL — AB (ref 12.0–16.0)
Hemoglobin: 10.6 g/dL — ABNORMAL LOW (ref 12.0–15.0)
LYMPHS ABS: 1.4 10*3/uL (ref 1.0–3.6)
LYMPHS PCT: 12 %
LYMPHS PCT: 2 %
Lymphs Abs: 0.4 10*3/uL — ABNORMAL LOW (ref 0.7–4.0)
MCH: 30.5 pg (ref 26.0–34.0)
MCH: 30.5 pg (ref 26.0–34.0)
MCHC: 32.2 g/dL (ref 32.0–36.0)
MCHC: 32.6 g/dL (ref 30.0–36.0)
MCV: 94.6 fL (ref 80.0–100.0)
MCV: 93.4 fL (ref 78.0–100.0)
MONOS PCT: 1 %
Monocytes Absolute: 0.4 10*3/uL (ref 0.2–0.9)
Monocytes Absolute: 0.2 10*3/uL (ref 0.1–1.0)
Monocytes Relative: 3 %
NEUTROS ABS: 9.5 10*3/uL — AB (ref 1.4–6.5)
NEUTROS ABS: 21.6 10*3/uL — AB (ref 1.7–7.7)
NEUTROS PCT: 82 %
Neutrophils Relative %: 97 %
PLATELETS: 152 10*3/uL (ref 150–400)
Platelets: 174 10*3/uL (ref 150–440)
RBC: 3.76 MIL/uL — ABNORMAL LOW (ref 3.80–5.20)
RBC: 3.48 MIL/uL — ABNORMAL LOW (ref 3.87–5.11)
RDW: 18.4 % — ABNORMAL HIGH (ref 11.5–14.5)
RDW: 18.3 % — ABNORMAL HIGH (ref 11.5–15.5)
WBC: 11.6 10*3/uL — AB (ref 3.6–11.0)
WBC: 22.2 10*3/uL — ABNORMAL HIGH (ref 4.0–10.5)

## 2016-11-08 LAB — URINALYSIS, ROUTINE W REFLEX MICROSCOPIC
Bilirubin Urine: NEGATIVE
Glucose, UA: NEGATIVE mg/dL
HGB URINE DIPSTICK: NEGATIVE
Ketones, ur: NEGATIVE mg/dL
Leukocytes, UA: NEGATIVE
Nitrite: NEGATIVE
PROTEIN: 100 mg/dL — AB
Specific Gravity, Urine: 1.006 (ref 1.005–1.030)
pH: 8 (ref 5.0–8.0)

## 2016-11-08 LAB — BODY FLUID CELL COUNT WITH DIFFERENTIAL
EOS FL: 0 %
Lymphs, Fluid: 2 %
Monocyte-Macrophage-Serous Fluid: 1 % — ABNORMAL LOW (ref 50–90)
NEUTROPHIL FLUID: 97 % — AB (ref 0–25)
Total Nucleated Cell Count, Fluid: 9200 cu mm — ABNORMAL HIGH (ref 0–1000)

## 2016-11-08 LAB — LACTATE DEHYDROGENASE, PLEURAL OR PERITONEAL FLUID: LD, Fluid: 415 U/L — ABNORMAL HIGH (ref 3–23)

## 2016-11-08 LAB — BRAIN NATRIURETIC PEPTIDE: B Natriuretic Peptide: 393 pg/mL — ABNORMAL HIGH (ref 0.0–100.0)

## 2016-11-08 LAB — GRAM STAIN

## 2016-11-08 LAB — PHOSPHORUS: PHOSPHORUS: 5.1 mg/dL — AB (ref 2.5–4.6)

## 2016-11-08 LAB — CREATININE, FLUID (PLEURAL, PERITONEAL, JP DRAINAGE): Creat, Fluid: 7.8 mg/dL

## 2016-11-08 LAB — PROCALCITONIN: Procalcitonin: 0.67 ng/mL

## 2016-11-08 LAB — GLUCOSE, CAPILLARY: Glucose-Capillary: 94 mg/dL (ref 65–99)

## 2016-11-08 LAB — APTT: APTT: 48 s — AB (ref 24–36)

## 2016-11-08 LAB — ECHOCARDIOGRAM LIMITED
Height: 58 in
Height: 58 in
Weight: 2352 oz
Weight: 2352 oz

## 2016-11-08 LAB — LACTIC ACID, PLASMA: LACTIC ACID, VENOUS: 2.4 mmol/L — AB (ref 0.5–1.9)

## 2016-11-08 LAB — TYPE AND SCREEN
ABO/RH(D): A POS
Antibody Screen: NEGATIVE

## 2016-11-08 LAB — PROTIME-INR
INR: 1.33
PROTHROMBIN TIME: 16.6 s — AB (ref 11.4–15.2)

## 2016-11-08 LAB — SURGICAL PCR SCREEN
MRSA, PCR: NEGATIVE
STAPHYLOCOCCUS AUREUS: POSITIVE — AB

## 2016-11-08 LAB — LIPASE, BLOOD: LIPASE: 27 U/L (ref 11–51)

## 2016-11-08 LAB — TROPONIN I: Troponin I: <0.03 ng/mL (ref <0.03)

## 2016-11-08 LAB — GLUCOSE, RANDOM: GLUCOSE: 123 mg/dL — AB (ref 65–99)

## 2016-11-08 SURGERY — CREATION, PERICARDIAL WINDOW, SUBXIPHOID APPROACH
Anesthesia: General | Site: Chest

## 2016-11-08 SURGERY — PERICARDIOCENTESIS

## 2016-11-08 MED ORDER — DEXTROSE 5 % IV SOLN
4.0000 ug/min | Freq: Once | INTRAVENOUS | Status: AC
  Administered 2016-11-08: 4 ug/min via INTRAVENOUS
  Filled 2016-11-08: qty 4

## 2016-11-08 MED ORDER — VANCOMYCIN HCL IN DEXTROSE 1-5 GM/200ML-% IV SOLN
1000.0000 mg | Freq: Once | INTRAVENOUS | Status: AC
Start: 2016-11-08 — End: 2016-11-08
  Administered 2016-11-08: 1000 mg via INTRAVENOUS
  Filled 2016-11-08: qty 200

## 2016-11-08 MED ORDER — LEVOTHYROXINE SODIUM 100 MCG IV SOLR: 66.0000 ug | Freq: Every day | INTRAVENOUS | Status: DC

## 2016-11-08 MED ORDER — LIDOCAINE HCL (PF) 1 % IJ SOLN
INTRAMUSCULAR | Status: AC
  Filled 2016-11-08: qty 30

## 2016-11-08 MED ORDER — DOBUTAMINE IN D5W 4-5 MG/ML-% IV SOLN
2.5000 ug/kg/min | INTRAVENOUS | Status: DC
  Administered 2016-11-08: 5 ug/kg/min via INTRAVENOUS
  Filled 2016-11-08: qty 250

## 2016-11-08 MED ORDER — MIDAZOLAM HCL 2 MG/2ML IJ SOLN
INTRAMUSCULAR | Status: AC
  Filled 2016-11-08: qty 2

## 2016-11-08 MED ORDER — FENTANYL CITRATE (PF) 100 MCG/2ML IJ SOLN
INTRAMUSCULAR | Status: AC
  Filled 2016-11-08: qty 2

## 2016-11-08 MED ORDER — PIPERACILLIN-TAZOBACTAM 3.375 G IVPB 30 MIN
3.3750 g | Freq: Once | INTRAVENOUS | Status: AC
  Administered 2016-11-08: 3.375 g via INTRAVENOUS
  Filled 2016-11-08: qty 50

## 2016-11-08 MED ORDER — ALBUTEROL SULFATE (2.5 MG/3ML) 0.083% IN NEBU: 2.5000 mg | INHALATION_SOLUTION | RESPIRATORY_TRACT | Status: DC | PRN

## 2016-11-08 MED ORDER — IOPAMIDOL (ISOVUE-370) INJECTION 76%
75.0000 mL | Freq: Once | INTRAVENOUS | Status: AC | PRN
  Administered 2016-11-08: 75 mL via INTRAVENOUS

## 2016-11-08 MED ORDER — HYDROMORPHONE HCL 1 MG/ML IJ SOLN
INTRAMUSCULAR | Status: AC
  Filled 2016-11-08: qty 1

## 2016-11-08 MED ORDER — HEPARIN SODIUM (PORCINE) 1000 UNIT/ML DIALYSIS: 1000.0000 [IU] | INTRAMUSCULAR | Status: DC | PRN

## 2016-11-08 MED ORDER — CALCIUM GLUCONATE 10 % IV SOLN
1.0000 g | Freq: Once | INTRAVENOUS | Status: AC
  Administered 2016-11-08: 1 g via INTRAVENOUS
  Filled 2016-11-08: qty 10

## 2016-11-08 MED ORDER — LEVOTHYROXINE SODIUM 112 MCG PO TABS: 112.0000 ug | ORAL_TABLET | Freq: Every day | ORAL | Status: DC

## 2016-11-08 MED ORDER — FENTANYL CITRATE (PF) 250 MCG/5ML IJ SOLN
INTRAMUSCULAR | Status: AC
  Filled 2016-11-08: qty 5

## 2016-11-08 MED ORDER — ONDANSETRON HCL 4 MG/2ML IJ SOLN
4.0000 mg | Freq: Once | INTRAMUSCULAR | Status: AC
  Administered 2016-11-08: 4 mg via INTRAVENOUS

## 2016-11-08 MED ORDER — PENTAFLUOROPROP-TETRAFLUOROETH EX AERO
1.0000 "application " | INHALATION_SPRAY | CUTANEOUS | Status: DC | PRN
  Filled 2016-11-08: qty 30

## 2016-11-08 MED ORDER — FENOFIBRATE 145 MG PO TABS
145.0000 mg | ORAL_TABLET | Freq: Every day | ORAL | Status: DC
  Filled 2016-11-08: qty 1

## 2016-11-08 MED ORDER — HYDROMORPHONE HCL 1 MG/ML IJ SOLN
INTRAMUSCULAR | Status: DC | PRN
  Administered 2016-11-08: 0.5 mg via INTRAVENOUS

## 2016-11-08 MED ORDER — ORAL CARE MOUTH RINSE
15.0000 mL | Freq: Two times a day (BID) | OROMUCOSAL | Status: DC
  Administered 2016-11-10 – 2016-11-13 (×7): 15 mL via OROMUCOSAL

## 2016-11-08 MED ORDER — SERTRALINE HCL 50 MG PO TABS: 50.0000 mg | ORAL_TABLET | Freq: Every day | ORAL | Status: DC

## 2016-11-08 MED ORDER — ONDANSETRON HCL 4 MG/2ML IJ SOLN
INTRAMUSCULAR | Status: AC
  Filled 2016-11-08: qty 2

## 2016-11-08 MED ORDER — ALTEPLASE 2 MG IJ SOLR: 2.0000 mg | Freq: Once | INTRAMUSCULAR | Status: DC | PRN

## 2016-11-08 MED ORDER — HYDROCORTISONE NA SUCCINATE PF 100 MG IJ SOLR
50.0000 mg | Freq: Four times a day (QID) | INTRAMUSCULAR | Status: DC
Start: 2016-11-08 — End: 2016-11-09
  Administered 2016-11-08 – 2016-11-09 (×4): 50 mg via INTRAVENOUS
  Filled 2016-11-08 (×4): qty 2

## 2016-11-08 MED ORDER — SODIUM CHLORIDE 0.9 % IV BOLUS (SEPSIS)
1000.0000 mL | INTRAVENOUS | Status: AC
  Administered 2016-11-08: 1000 mL via INTRAVENOUS

## 2016-11-08 MED ORDER — NOREPINEPHRINE BITARTRATE 1 MG/ML IV SOLN
2.0000 ug/min | INTRAVENOUS | Status: DC
  Administered 2016-11-08: 15 ug/min via INTRAVENOUS
  Administered 2016-11-09: 5 ug/min via INTRAVENOUS
  Administered 2016-11-10 (×2): 10 ug/min via INTRAVENOUS
  Filled 2016-11-08 (×5): qty 4

## 2016-11-08 MED ORDER — HYDROCORTISONE NA SUCCINATE PF 100 MG IJ SOLR
100.0000 mg | INTRAMUSCULAR | Status: AC
  Administered 2016-11-08: 100 mg via INTRAVENOUS
  Filled 2016-11-08: qty 2

## 2016-11-08 MED ORDER — PROPOFOL 10 MG/ML IV BOLUS
INTRAVENOUS | Status: AC
  Filled 2016-11-08: qty 20

## 2016-11-08 MED ORDER — ACETAMINOPHEN 325 MG PO TABS: 650.0000 mg | ORAL_TABLET | Freq: Four times a day (QID) | ORAL | Status: DC | PRN

## 2016-11-08 MED ORDER — SODIUM CHLORIDE 0.9 % IV SOLN: 100.0000 mL | INTRAVENOUS | Status: DC | PRN

## 2016-11-08 MED ORDER — EPHEDRINE 5 MG/ML INJ
INTRAVENOUS | Status: AC
  Filled 2016-11-08: qty 10

## 2016-11-08 MED ORDER — HEPARIN (PORCINE) IN NACL 2-0.9 UNIT/ML-% IJ SOLN
INTRAMUSCULAR | Status: DC | PRN
  Administered 2016-11-08: 500 mL

## 2016-11-08 MED ORDER — LIDOCAINE HCL (PF) 1 % IJ SOLN
INTRAMUSCULAR | Status: AC
  Filled 2016-11-08: qty 10

## 2016-11-08 MED ORDER — PRAVASTATIN SODIUM 40 MG PO TABS
40.0000 mg | ORAL_TABLET | Freq: Every day | ORAL | Status: DC
  Administered 2016-11-08 – 2016-11-13 (×6): 40 mg via ORAL
  Filled 2016-11-08 (×6): qty 1

## 2016-11-08 MED ORDER — LIDOCAINE HCL (PF) 1 % IJ SOLN
INTRAMUSCULAR | Status: DC | PRN
  Administered 2016-11-08: 30 mL via INTRADERMAL

## 2016-11-08 MED ORDER — SUCCINYLCHOLINE CHLORIDE 200 MG/10ML IV SOSY
PREFILLED_SYRINGE | INTRAVENOUS | Status: AC
  Filled 2016-11-08: qty 10

## 2016-11-08 MED ORDER — ACETAMINOPHEN 160 MG/5ML PO SOLN: 650.0000 mg | Freq: Four times a day (QID) | ORAL | Status: DC | PRN

## 2016-11-08 MED ORDER — GABAPENTIN 400 MG PO CAPS
400.0000 mg | ORAL_CAPSULE | Freq: Every day | ORAL | Status: DC
  Administered 2016-11-08 – 2016-11-13 (×6): 400 mg via ORAL
  Filled 2016-11-08 (×6): qty 1

## 2016-11-08 MED ORDER — SODIUM CHLORIDE 0.9 % IV BOLUS (SEPSIS)
1000.0000 mL | Freq: Once | INTRAVENOUS | Status: AC
  Administered 2016-11-08: 1000 mL via INTRAVENOUS

## 2016-11-08 MED ORDER — HEPARIN (PORCINE) IN NACL 2-0.9 UNIT/ML-% IJ SOLN
INTRAMUSCULAR | Status: AC
  Filled 2016-11-08: qty 500

## 2016-11-08 MED ORDER — LIDOCAINE HCL (PF) 1 % IJ SOLN: 5.0000 mL | INTRAMUSCULAR | Status: DC | PRN

## 2016-11-08 MED ORDER — FENTANYL CITRATE (PF) 100 MCG/2ML IJ SOLN
INTRAMUSCULAR | Status: DC | PRN
  Administered 2016-11-08: 50 ug via INTRAVENOUS
  Administered 2016-11-08 (×2): 25 ug via INTRAVENOUS

## 2016-11-08 MED ORDER — SODIUM CHLORIDE 0.9 % IV SOLN
INTRAVENOUS | Status: DC
  Administered 2016-11-08: 14:00:00 via INTRAVENOUS

## 2016-11-08 MED ORDER — LIDOCAINE-PRILOCAINE 2.5-2.5 % EX CREA
1.0000 "application " | TOPICAL_CREAM | CUTANEOUS | Status: DC | PRN
  Filled 2016-11-08: qty 5

## 2016-11-08 MED ORDER — SODIUM CHLORIDE 0.9 % IV SOLN: 250.0000 mL | INTRAVENOUS | Status: DC | PRN

## 2016-11-08 MED ORDER — DOBUTAMINE IN D5W 4-5 MG/ML-% IV SOLN
15.0000 ug/kg/min | INTRAVENOUS | Status: DC
  Administered 2016-11-08: 15 ug/kg/min via INTRAVENOUS

## 2016-11-08 MED ORDER — SODIUM BICARBONATE 8.4 % IV SOLN
50.0000 meq | Freq: Once | INTRAVENOUS | Status: AC
  Administered 2016-11-08: 50 meq via INTRAVENOUS
  Filled 2016-11-08: qty 50

## 2016-11-08 MED ORDER — SEVELAMER CARBONATE 800 MG PO TABS
2400.0000 mg | ORAL_TABLET | Freq: Two times a day (BID) | ORAL | Status: DC
  Administered 2016-11-11 – 2016-11-13 (×5): 2400 mg via ORAL
  Filled 2016-11-08 (×8): qty 3

## 2016-11-08 MED ORDER — ONDANSETRON HCL 4 MG/2ML IJ SOLN
INTRAMUSCULAR | Status: DC | PRN
  Administered 2016-11-08: 4 mg via INTRAVENOUS

## 2016-11-08 SURGICAL SUPPLY — 7 items
BAG SNAP BAND KOVER 36X36 (MISCELLANEOUS) ×3 IMPLANT
COVER DOME SNAP 22 D (MISCELLANEOUS) ×3 IMPLANT
EVACUATOR 1/8 PVC DRAIN (DRAIN) ×3 IMPLANT
PERIVAC PERICARDIOCENTESIS 8.3 (TRAY / TRAY PROCEDURE) ×3 IMPLANT
PROTECTION STATION PRESSURIZED (MISCELLANEOUS) ×3
STATION PROTECTION PRESSURIZED (MISCELLANEOUS) ×1 IMPLANT
WIRE EMERALD 3MM-J .035X150CM (WIRE) ×3 IMPLANT

## 2016-11-08 NOTE — ED Notes (Signed)
Pt placed on bedpan

## 2016-11-08 NOTE — Consult Note (Signed)
CARDIOLOGY CONSULT NOTE  Patient ID: Erin [name], MRN: [id number], DOB/AGE: Aug 04, 1971 46 y.o. Admit date: 11/08/2016 Date of Consult: 11/08/2016  Primary Physician: [facility] Primary Cardiologist: Dr Welton Flakes Referring Physician: Dr Tyrone Sage  Chief Complaint: shortness of breath/chest pain Reason for Consultation: pericardial tamponade  HPI: This is a 46 year old woman with multiple chronic medical problems who is transferred for further treatment of a large pericardial effusion.  The patient has been admitted to [facility] with complaints of chest pain and shortness of breath.  She was noted to have a large pericardial effusion. A bedside pericardiocentesis was reportedly performed with 80 cc of straw-colored fluid drained.   The patient is here alone. She is a fairly poor historian and currently has a blood pressure of 70 mmHg. She asks that we call her brother who lives in [place]. She denies any recent respiratory or GI illness. States that she has been in her normal state of health until a few days ago when she began to feel weak and short of breath. She's been on hemodialysis for a proximally 5 years. She dialyzes Saturday, Tuesday, and Thursday. She did go to dialysis Saturday and states that she tolerated this okay. She has no other complaints at present.  Medical History:  Past Medical History:  Diagnosis Date  . Anxiety   . Asthma   . Bipolar affect, depressed (HCC)   . Blind   . Chronic headaches    Seeing Pain Management  . Constipation   . Depression   . Epilepsy (HCC)   . Fibromyalgia   . History of CVA (cerebrovascular accident)   . History of DVT (deep vein thrombosis)   . Hyperlipidemia   . Hypothyroid   . Kidney failure    Currently on Dialysis  . Narcolepsy   . 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 Psychosis   . Renal insufficiency       Surgical History:  Past Surgical History:    Procedure Laterality Date  . BRAIN SURGERY  1976  . BREAST LUMPECTOMY  2001  . CARDIAC CATHETERIZATION Right 10/03/2016   Procedure: Coronary Angiography;  Surgeon: Laurier Nancy, MD;  Location: [facility] INVASIVE CV LAB;  Service: Cardiovascular;  Laterality: Right;  . CHOLECYSTECTOMY  2001  . DG AV DIALYSIS GRAFT DECLOT OR     X 4  . GIVENS CAPSULE STUDY N/A 02/29/2016   Procedure: GIVENS CAPSULE STUDY;  Surgeon: Elnita Maxwell, MD;  Location: [facility] ENDOSCOPY;  Service: Endoscopy;  Laterality: N/A;  . PERIPHERAL VASCULAR CATHETERIZATION N/A 08/21/2015   Procedure: Dialysis/Perma Catheter Insertion;  Surgeon: Renford Dills, MD;  Location: [facility] INVASIVE CV LAB;  Service: Cardiovascular;  Laterality: N/A;  . PERIPHERAL VASCULAR CATHETERIZATION N/A 02/29/2016   Procedure: Dialysis/Perma Catheter Removal;  Surgeon: Annice Needy, MD;  Location: [facility] INVASIVE CV LAB;  Service: Cardiovascular;  Laterality: N/A;  . PERIPHERAL VASCULAR CATHETERIZATION N/A 10/28/2016   Procedure: A/V Fistulagram;  Surgeon: Renford Dills, MD;  Location: [facility] INVASIVE CV LAB;  Service: Cardiovascular;  Laterality: N/A;  . PERIPHERAL VASCULAR CATHETERIZATION N/A 10/28/2016   Procedure: A/V Shunt Intervention;  Surgeon: Renford Dills, MD;  Location: [facility] INVASIVE CV LAB;  Service: Cardiovascular;  Laterality: N/A;  . PITUITARY EXCISION  1976     Home Meds: Prior to Admission medications   Medication Sig Start Date End Date Taking? Authorizing Provider  estradiol (ESTRACE) 0.1 MG/GM vaginal cream Place 1 Applicatorful vaginally 2 (two)  times a week. Mondays and Thursdays    Historical Provider, MD  fenofibrate (TRICOR) 145 MG tablet Take 145 mg by mouth every other day.    Historical Provider, MD  fludrocortisone (FLORINEF) 0.1 MG tablet Take 0.1 mg by mouth daily.    Historical Provider, MD  furosemide (LASIX) 40 MG tablet Take 40 mg by mouth 2 (two) times daily.    Historical Provider, MD  gabapentin  (NEURONTIN) 100 MG capsule Take 400 mg by mouth at bedtime.    Historical Provider, MD  hydrocortisone (CORTEF) 10 MG tablet Take 5-10 mg by mouth 2 (two) times daily. Pt takes 10 mg in the morning and 5 mg at bedtime.    Historical Provider, MD  levothyroxine (SYNTHROID, LEVOTHROID) 112 MCG tablet Take 112 mcg by mouth daily.     Historical Provider, MD  loratadine (CLARITIN) 10 MG tablet Take 10 mg by mouth daily.    Historical Provider, MD  lovastatin (MEVACOR) 40 MG tablet Take 20 mg by mouth every evening.     Historical Provider, MD  Melatonin 5 MG TABS Take 2 tablets by mouth at bedtime.    Historical Provider, MD  methylphenidate (RITALIN LA) 30 MG 24 hr capsule Take 30 mg by mouth every morning.    Historical Provider, MD  nystatin ointment (MYCOSTATIN) Apply 1 application topically 2 (two) times daily. Apply to affected area (groin, abdominal fold). 06/30/16   Standley Brooking, MD  ondansetron (ZOFRAN) 4 MG tablet Take 1 tablet (4 mg total) by mouth every 8 (eight) hours as needed for nausea or vomiting. Patient not taking: Reported on 10/26/2016 10/15/16   Nita Sickle, MD  oxyCODONE (OXY IR/ROXICODONE) 5 MG immediate release tablet Take 1 tablet (5 mg total) by mouth every 6 (six) hours as needed for severe pain. 10/31/16   Sharyn Creamer, MD  pantoprazole (PROTONIX) 40 MG tablet Take 1 tablet (40 mg total) by mouth daily. 02/27/16   [name], MD  risperidone (RISPERDAL) 4 MG tablet Take 2-4 mg by mouth 2 (two) times daily. Pt takes 4 mg in the morning and 2 mg at bedtime.    Historical Provider, MD  sertraline (ZOLOFT) 50 MG tablet Take 50 mg by mouth daily.    Historical Provider, MD  sevelamer carbonate (RENVELA) 800 MG tablet Take 2,400 mg by mouth 2 (two) times daily.     Historical Provider, MD  topiramate (TOPAMAX) 200 MG tablet Take 200 mg by mouth 2 (two) times daily. Pt takes with a 50 mg tablet.    Historical Provider, MD  topiramate (TOPAMAX) 50 MG tablet Take 50 mg by mouth 2  (two) times daily. Pt takes with a 200 mg tablet.    Historical Provider, MD  traZODone (DESYREL) 100 MG tablet Take 100 mg by mouth at bedtime.    Historical Provider, MD  Vitamin D, Ergocalciferol, (DRISDOL) 50000 UNITS CAPS capsule Take 50,000 Units by mouth every 30 (thirty) days. Takes on the 8th of every month    Historical Provider, MD    Inpatient Medications:  . [MAR Hold] fenofibrate  145 mg Oral Daily  . [MAR Hold] hydrocortisone sod succinate (SOLU-CORTEF) inj  50 mg Intravenous Q6H  . [MAR Hold] levothyroxine  66 mcg Intravenous Daily  . [MAR Hold] mouth rinse  15 mL Mouth Rinse BID  . [MAR Hold] pravastatin  40 mg Oral q1800  . [MAR Hold] sevelamer carbonate  2,400 mg Oral BID WC   . sodium chloride 10 mL/hr at 11/08/16  1350  . [MAR Hold] DOBUTamine 15 mcg/kg/min (11/08/16 1351)  . norepinephrine (LEVOPHED) Adult infusion 15 mcg/min (11/08/16 1351)    Allergies:  Allergies  Allergen Reactions  . Dhea [Nutritional Supplements] Anaphylaxis    Patient states medication is DHE for migraines, not DHEA  . Demerol [Meperidine] Other (See Comments)    Reaction:  Hallucinations   . Floxin [Ofloxacin] Other (See Comments)    Reaction:  Hallucinations   . Nsaids Nausea And Vomiting and Swelling  . Nubain [Nalbuphine Hcl] Other (See Comments)    Reaction:  Hallucinations   . Phenergan [Promethazine Hcl] Other (See Comments)    Reaction:  Restless legs   . Stadol [Butorphanol] Other (See Comments)    Reaction:  Hallucinations   . Erythromycin Diarrhea and Rash    Social History   Social History  . Marital status: Divorced    Spouse name: N/A  . Number of children: N/A  . Years of education: N/A   Occupational History  . Not on file.   Social History Main Topics  . Smoking status: Current Every Day Smoker    Packs/day: 0.50    Years: 16.00    Types: Cigarettes  . Smokeless tobacco: Never Used  . Alcohol use 6.0 oz/week    10 Shots of liquor per week      Comment: per patient drinks liquor twice a week  . Drug use: No  . Sexual activity: Not on file   Other Topics Concern  . Not on file   Social History Narrative  . No narrative on file     Family History  Problem Relation Age of Onset  . Heart attack Mother   . Migraines Mother   . Aneurysm Mother   . Diabetes Father      Review of Systems: General: negative for chills, fever, night sweats or weight changes.  ENT: negative for rhinorrhea or epistaxis Cardiovascular: see HPI Dermatological: negative for rash Respiratory: negative for cough or wheezing GI: negative for nausea, vomiting, diarrhea, bright red blood per rectum, melena, or hematemesis GU: no hematuria, urgency, or frequency Neurologic: negative for visual changes, syncope, headache, or dizziness Heme: no easy bruising or bleeding Endo: negative for excessive thirst, thyroid disorder, or flushing Musculoskeletal: negative for joint pain or swelling, negative for myalgias  All other systems reviewed and are otherwise negative except as noted above.  Physical Exam: Blood pressure (!) 62/36, pulse (!) 102, temperature 97.1 F (36.2 C), temperature source Oral, resp. rate (!) 34, height 4\' 10"  (1.473 m), last menstrual period 11/21/2007, SpO2 97 %. Pt is chronically ill-appearing. She is awake and able to answer questions appropriately. HEENT: normal Neck: JVP elevated with positive HJR. Carotid upstrokes normal. No thyromegaly. Lungs: equal expansion, clear bilaterally CV: RRR without murmur or gallop Abd: soft, NT, +BS, no bruit, no hepatosplenomegaly Back: no CVA tenderness Ext: no C/C/E Skin: warm and dry without rash Neuro: CNII-XII intact             Strength intact = bilaterally    Labs:  Recent Labs  11/06/16 0635 11/08/16 0733  TROPONINI <0.03 <0.03   Lab Results  Component Value Date   WBC 11.6 (H) 11/08/2016   HGB 11.5 (L) 11/08/2016   HCT 35.5 11/08/2016   MCV 94.6 11/08/2016   PLT 174  11/08/2016    Recent Labs Lab 11/08/16 0733  NA 133*  K 4.2  CL 102  CO2 18*  BUN 54*  CREATININE 8.67*  CALCIUM 7.5*  PROT 5.6*  BILITOT 0.5  ALKPHOS 125  ALT <5*  AST 27  GLUCOSE 107*   No results found for: CHOL, HDL, LDLCALC, TRIG Lab Results  Component Value Date   DDIMER 2.05 (H) 06/04/2016    Radiology/Studies:  Dg Chest 2 View  Result Date: 10/23/2016 CLINICAL DATA:  Left-sided chest pain for 15 minutes prior to arrival, no known injury, initial encounter EXAM: CHEST  2 VIEW COMPARISON:  10/15/2016 FINDINGS: The heart size and mediastinal contours are within normal limits. Both lungs are clear. The visualized skeletal structures are unremarkable. IMPRESSION: No active cardiopulmonary disease. Electronically Signed   By: Alcide Clever M.D.   On: 10/23/2016 21:48   Dg Ribs Unilateral W/chest Right  Result Date: 10/31/2016 CLINICAL DATA:  Larey Seat and injured right chest. EXAM: RIGHT RIBS AND CHEST - 3+ VIEW COMPARISON:  Chest x-ray 10/26/2016 FINDINGS: The heart is mildly enlarged but stable. There is tortuosity of the thoracic aorta. The lungs are clear. No pleural effusion or pneumothorax. Dedicated views of the right ribs demonstrate a nondisplaced fracture of the right anterior eighth rib. Possible nondisplaced 9 rib fracture also. IMPRESSION: Nondisplaced right eighth rib fracture and possible ninth rib fracture also. Stable cardiac enlargement but no acute pulmonary findings. Electronically Signed   By: Rudie Meyer M.D.   On: 10/31/2016 17:29   Dg Lumbar Spine Complete  Result Date: 10/26/2016 CLINICAL DATA:  Lower back pain after fall last night. EXAM: LUMBAR SPINE - COMPLETE 4+ VIEW COMPARISON:  CT scan of July 04, 2016. FINDINGS: There is no evidence of lumbar spine fracture. Alignment is normal. Intervertebral disc spaces are maintained. IMPRESSION: Normal lumbar spine. Electronically Signed   By: Lupita Raider, M.D.   On: 10/26/2016 18:57   Dg Knee 2 Views  Left  Result Date: 10/31/2016 CLINICAL DATA:  Pt states, "there was some packaging tape in the floor that got stuck to my foot and I tripped which caused my fall." Pt complains of right posterior rib pain and anterior left knee pain. Pt states she has previous rib fractures EXAM: LEFT KNEE - 1-2 VIEW COMPARISON:  05/09/2016 FINDINGS: No fracture of the proximal tibia or distal femur. Patella is normal. No joint effusion. Vascular graft noted in the medial thigh IMPRESSION: No fracture or dislocation. Electronically Signed   By: Genevive Bi M.D.   On: 10/31/2016 17:29   Dg Abdomen 1 View  Result Date: 11/08/2016 CLINICAL DATA:  Femoral line placement, hypotension, chest pain, dialysis EXAM: ABDOMEN - 1 VIEW COMPARISON:  Lumbar spine films of 10/26/2016 and CT abdomen pelvis of 07/04/2016 FINDINGS: Supine views of the abdomen show large and small bowel gas to be present with no significant distension. Linear scarring, atelectasis, or pneumonia is noted at lung bases left-greater-than-right. Surgical clips are present in the right upper quadrant from prior cholecystectomy. The right femoral venous line tip extends to the L4 level. IMPRESSION: 1. Right femoral venous catheter tip extends to L4. 2. No bowel obstruction. Electronically Signed   By: Dwyane Dee M.D.   On: 11/08/2016 08:31   Ct Chest Wo Contrast  Result Date: 10/31/2016 CLINICAL DATA:  Tripped and fell today with trauma to the right side of the back. EXAM: CT CHEST WITHOUT CONTRAST TECHNIQUE: Multidetector CT imaging of the chest was performed following the standard protocol without IV contrast. COMPARISON:  Chest radiography same day. CT 10/15/2016. CT 06/04/2016 FINDINGS: Cardiovascular: Pericardial effusion is slightly larger than was seen on the previous study. Some coronary  artery calcification is noted. Some aortic atherosclerotic calcification is noted. Mediastinum/Nodes: No sign of adenopathy or mass without contrast. Previously,  inferior right hilar and subcarinal nodes were noted. I do not suspect that there has been enlargement. Lungs/Pleura: No new pneumothorax or hemothorax. No pleural effusion. No active pulmonary disease. Subpleural nodule in the right middle lobe image 54 is unchanged from previous studies. Upper Abdomen: Negative Musculoskeletal: Old healed rib fractures on the right. No acute rib fracture seen. Old compression fracture at T6 with vertebra plana. No change from previous studies. IMPRESSION: No acute or traumatic finding. Old healed or healing rib fractures on the right. Old T6 compression fracture with vertebra plana. Small pericardial effusion, slightly larger than on the previous study. Electronically Signed   By: Paulina Fusi M.D.   On: 10/31/2016 18:39   Ct Angio Chest Pe W And/or Wo Contrast  Result Date: 11/08/2016 CLINICAL DATA:  Chest pain. Pain for 2 days following dialysis. Hypotensive. EXAM: CT ANGIOGRAPHY CHEST WITH CONTRAST TECHNIQUE: Multidetector CT imaging of the chest was performed using the standard protocol during bolus administration of intravenous contrast. Multiplanar CT image reconstructions and MIPs were obtained to evaluate the vascular anatomy. CONTRAST:  75 mL Isovue COMPARISON:  CT 10/31/2016 FINDINGS: Cardiovascular: No filling defects within pulmonary artery to suggest acute pulmonary embolism. No acute findings aorta great vessels. Moderate size pleural effusion is present measuring 2 cm in thickness along the LEFT ventricle. Mediastinum/Nodes: No axillary adenopathy. LEFT supraclavicular node measures 11 mm (image 11, series 6). RIGHT lower paratracheal lymph node measures 9 mm. Prevascular lymph node measures 12 mm. Esophagus normal. Lungs/Pleura: There is para bronchovascular thickening in the inferior LEFT lower lobe and mild consolidation. Small bilateral effusions. Pneumothorax. No pulmonary mass identified. Upper Abdomen: Limited view of the liver, kidneys, pancreas are  unremarkable. Normal adrenal glands. Musculoskeletal: No aggressive osseous lesion. Severe compression fracture of the T6 vertebral body unchanged. Review of the MIP images confirms the above findings. IMPRESSION: 1. Moderate to large pericardial effusion is new from 10/31/2016 2. No evidence of pulmonary embolism. 3. New bilateral small effusions and LEFT basilar atelectasis versus infiltrate or pneumonitis. 4. Mild mediastinal and RIGHT supraclavicular adenopathy. Favor reactive adenopathy. Electronically Signed   By: Genevive Bi M.D.   On: 11/08/2016 09:34   Ct Angio Chest Pe W And/or Wo Contrast  Result Date: 10/15/2016 CLINICAL DATA:  Chest pressure radiating to the jaw this morning. EXAM: CT ANGIOGRAPHY CHEST WITH CONTRAST TECHNIQUE: Multidetector CT imaging of the chest was performed using the standard protocol during bolus administration of intravenous contrast. Multiplanar CT image reconstructions and MIPs were obtained to evaluate the vascular anatomy. CONTRAST:  75 cc Isovue 370 COMPARISON:  Portable chest obtained earlier today. Chest CTA dated 06/04/2016. FINDINGS: Cardiovascular: Small pericardial effusion with a maximum thickness of 12.5 mm. Normally opacified pulmonary arteries with no pulmonary arterial filling defects seen. Mediastinum/Nodes: Small amount of fluid in the superior pericardial recess. Enlarged subcarinal and right hilar lymph nodes without significant change. A subcarinal node on the right measures 12.6 mm in short axis diameter on image number 34 of series 5, previously 11.0 mm. A right hilar node has a short axis diameter of 11.8 mm on image number 38 of series 5, previously 11.3 mm. Lungs/Pleura: Minimal bilateral pleural fluid. Minimal right lower lobe atelectasis. Upper Abdomen: Cholecystectomy clips. Musculoskeletal: Stable vertebral plana appearance of the T6 vertebral body with associated sclerosis and mild bony retropulsion. No acute fracture lines. Previously noted  old right  rib fractures. Review of the MIP images confirms the above findings. IMPRESSION: 1. No pulmonary emboli. 2. Interval small pericardial effusion and minimal bilateral pleural fluid. 3. No significant change in mild subcarinal and right hilar adenopathy. Electronically Signed   By: Beckie Salts M.D.   On: 10/15/2016 10:27   Dg Chest Portable 1 View  Result Date: 11/08/2016 CLINICAL DATA:  Pericardiocentesis. EXAM: PORTABLE CHEST 1 VIEW COMPARISON:  CT 11/08/2016.  Chest x-ray 11/08/2016. FINDINGS: Cardiomegaly with bilateral pulmonary interstitial prominence consistent with congestive heart failure. Small left pleural effusion. No pneumothorax. IMPRESSION: Congestive heart failure with bilateral pulmonary interstitial edema. Degree of cardiomegaly is unchanged from prior exam . Electronically Signed   By: Maisie Fus  Register   On: 11/08/2016 12:13   Dg Chest Portable 1 View  Result Date: 11/08/2016 CLINICAL DATA:  Chest pain. EXAM: PORTABLE CHEST 1 VIEW COMPARISON:  Radiographs of October 31, 2016. FINDINGS: Stable cardiomediastinal silhouette. Both lungs are clear. No pneumothorax or pleural effusion is noted. The visualized skeletal structures are unremarkable. IMPRESSION: No acute cardiopulmonary abnormality seen. Electronically Signed   By: Lupita Raider, M.D.   On: 11/08/2016 08:32   Dg Chest Portable 1 View  Result Date: 10/26/2016 CLINICAL DATA:  Status post fall, with left shoulder pain. Initial encounter. EXAM: PORTABLE CHEST 1 VIEW COMPARISON:  Chest radiograph performed 10/23/2016 FINDINGS: The lungs are well-aerated and clear. There is no evidence of focal opacification, pleural effusion or pneumothorax. The cardiomediastinal silhouette is borderline normal in size. No acute osseous abnormalities are seen. IMPRESSION: No acute cardiopulmonary process seen. Electronically Signed   By: Roanna Raider M.D.   On: 10/26/2016 19:26   Dg Chest Portable 1 View  Result Date:  10/15/2016 CLINICAL DATA:  Chest pain this morning EXAM: PORTABLE CHEST 1 VIEW COMPARISON:  10/02/2016 FINDINGS: Cardiomegaly. No focal airspace opacity, effusion or edema. No acute bony abnormality. IMPRESSION: Cardiomegaly.  No active disease. Electronically Signed   By: Charlett Nose M.D.   On: 10/15/2016 09:29   Dg Shoulder Left  Result Date: 10/26/2016 CLINICAL DATA:  Patient fell last night and complains of left shoulder pain. EXAM: LEFT SHOULDER - 2+ VIEW COMPARISON:  07/09/2014 FINDINGS: Bones are diffusely demineralized. No evidence for an acute fracture. No evidence for shoulder dislocation or separation. Vascular stents noted proximal left all arm with apparent graft in situ. IMPRESSION: Negative. Electronically Signed   By: Kennith Center M.D.   On: 10/26/2016 18:54   Dg Hip Unilat W Or Wo Pelvis 2-3 Views Right  Result Date: 10/26/2016 CLINICAL DATA:  Status post fall, with right hip pain. Initial encounter. EXAM: DG HIP (WITH OR WITHOUT PELVIS) 2-3V RIGHT COMPARISON:  Right hip radiographs performed 05/09/2016 FINDINGS: There is no evidence of fracture or dislocation. There is mildly increased uncovering of the right femoral head, suggestive of an underlying right hip joint effusion. Slight cortical irregularity is noted along the right femoral head, with mild chronic flattening. The left hip joint is unremarkable in appearance. The sacroiliac joints are unremarkable in appearance. The visualized bowel gas pattern is grossly unremarkable in appearance. A vascular stent is noted overlying the proximal left thigh. IMPRESSION: 1. No evidence of fracture or dislocation. 2. Mildly increased uncovering of the right femoral head, suggestive of an underlying right hip joint effusion. Slight cortical irregularity along the right femoral head, with mild chronic flattening. Electronically Signed   By: Roanna Raider M.D.   On: 10/26/2016 18:56    EKG: Normal sinus rhythm with  nonspecific  IVCD  Cardiac Studies: Echocardiogram done at the bedside here on arrival was personally reviewed. There is a large pericardial effusion circumflex and pharyngeal to the heart. There is evidence of RV collapse and respiratory variation in mitral inflow with findings suggestive of tamponade physiology  ASSESSMENT AND PLAN:  Acute cardiac tamponade in patient with large pericardial effusion. Plan for emergency paracardial centesis under fluoroscopic guidance. I reviewed risks, indications, and alternatives with both the patient and her brother who I talked to over the telephone. They understand risks of serious complications such as cardiac perforation or death occur at a low frequency approximately 1%. They also understand this procedure needs to be done emergently in the context of hemodynamic instability. Case discussed with Dr Tyrone Sage and Dr Kendrick Fries, both of whom are at the bedside of the patient.   SignedTonny Bollman MD, [facility] 11/08/2016, 2:28 PM

## 2016-11-08 NOTE — Consult Note (Signed)
Reason for Consult: Continuity of ESRD care Referring Physician: [place] M.D. 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 She usually gets dialysis on a Tuesday/Thursday/Saturday schedule at the [facility] unit in [place]. (Under the care of Dr. Mosetta Pigeon). She also has chronic headaches and hip pain, history of CVA, history of DVT and bipolar disorder.  She presented to the emergency room with chest pain and shortness of breath for the second time in 2 days after coronary angiogram that was unremarkable back on 10/28/16. She was found to have profound hypotension and CT scan of the chest showed moderate sized pericardial effusion with 80 mL tapped at bedside pericardiocentesis and then transferred here for further management. This afternoon, she underwent pericardiocentesis with placement of pericardial drain.  I saw her after the procedure when she was somewhat somnolent from recent sedation. She reports that she last went to her dialysis treatment on Saturday but has not been completing her entire prescribed time because of pain. She reports that her estimated dry weight is 64 kg.  Past Medical History:  Diagnosis Date  . Anxiety   . Asthma   . Bipolar affect, depressed (HCC)   . Blind   . Chronic headaches    Seeing Pain Management  . Constipation   . Depression   . Epilepsy (HCC)   . Fibromyalgia   . History of CVA (cerebrovascular accident)   . History of DVT (deep vein thrombosis)   . Hyperlipidemia   . Hypothyroid   . Kidney failure    Currently on Dialysis  . Narcolepsy   . 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 Psychosis   . Renal insufficiency     Past Surgical History:   Procedure Laterality Date  . BRAIN SURGERY  1976  . BREAST LUMPECTOMY  2001  . CARDIAC CATHETERIZATION Right 10/03/2016   Procedure: Coronary Angiography;  Surgeon: Laurier Nancy, MD;  Location: [facility] INVASIVE CV LAB;  Service: Cardiovascular;  Laterality: Right;  . CHOLECYSTECTOMY  2001  . DG AV DIALYSIS GRAFT DECLOT OR     X 4  . GIVENS CAPSULE STUDY N/A 02/29/2016   Procedure: GIVENS CAPSULE STUDY;  Surgeon: Elnita Maxwell, MD;  Location: [facility] ENDOSCOPY;  Service: Endoscopy;  Laterality: N/A;  . PERIPHERAL VASCULAR CATHETERIZATION N/A 08/21/2015   Procedure: Dialysis/Perma Catheter Insertion;  Surgeon: Renford Dills, MD;  Location: [facility] INVASIVE CV LAB;  Service: Cardiovascular;  Laterality: N/A;  . PERIPHERAL VASCULAR CATHETERIZATION N/A 02/29/2016   Procedure: Dialysis/Perma Catheter Removal;  Surgeon: Annice Needy, MD;  Location: [facility] INVASIVE CV LAB;  Service: Cardiovascular;  Laterality: N/A;  . PERIPHERAL VASCULAR CATHETERIZATION N/A 10/28/2016   Procedure: A/V Fistulagram;  Surgeon: Renford Dills, MD;  Location: [facility] INVASIVE CV LAB;  Service: Cardiovascular;  Laterality: N/A;  . PERIPHERAL VASCULAR CATHETERIZATION N/A 10/28/2016   Procedure: A/V Shunt Intervention;  Surgeon: Renford Dills, MD;  Location: [facility] INVASIVE CV LAB;  Service: Cardiovascular;  Laterality: N/A;  . PITUITARY EXCISION  1976    Family History  Problem Relation Age of Onset  . Heart attack Mother   . Migraines Mother   . Aneurysm Mother   . Diabetes Father     Social History:  reports that she has been smoking Cigarettes.  She  has a 8.00 pack-year smoking history. She has never used smokeless tobacco. She reports that she drinks about 6.0 oz of alcohol per week . She reports that she does not use drugs.  Allergies:  Allergies  Allergen Reactions  . Dhea [Nutritional Supplements] Anaphylaxis    Patient states medication is DHE for migraines, not DHEA  . Demerol [Meperidine] Other (See  Comments)    Reaction:  Hallucinations   . Floxin [Ofloxacin] Other (See Comments)    Reaction:  Hallucinations   . Nsaids Nausea And Vomiting and Swelling  . Nubain [Nalbuphine Hcl] Other (See Comments)    Reaction:  Hallucinations   . Phenergan [Promethazine Hcl] Other (See Comments)    Reaction:  Restless legs   . Stadol [Butorphanol] Other (See Comments)    Reaction:  Hallucinations   . Erythromycin Diarrhea and Rash    Medications:  Scheduled: . fenofibrate  145 mg Oral Daily  . hydrocortisone sod succinate (SOLU-CORTEF) inj  50 mg Intravenous Q6H  . levothyroxine  66 mcg Intravenous Daily  . mouth rinse  15 mL Mouth Rinse BID  . pravastatin  40 mg Oral q1800  . sevelamer carbonate  2,400 mg Oral BID WC    BMP Latest Ref Rng & Units 11/08/2016 11/06/2016 10/31/2016  Glucose 65 - 99 mg/dL 774(J) 28(N) 69  BUN 6 - 20 mg/dL 86(V) 67(M) 09(O)  Creatinine 0.44 - 1.00 mg/dL 7.09(G) 2.83(M) 6.29(U)  Sodium 135 - 145 mmol/L 133(L) 134(L) 136  Potassium 3.5 - 5.1 mmol/L 4.2 5.6(H) 3.0(L)  Chloride 101 - 111 mmol/L 102 100(L) 101  CO2 22 - 32 mmol/L 18(L) 22 23  Calcium 8.9 - 10.3 mg/dL 7.5(L) 8.7(L) 6.7(L)   CBC Latest Ref Rng & Units 11/08/2016 11/06/2016 10/29/2016  WBC 3.6 - 11.0 K/uL 11.6(H) 6.0 4.3  Hemoglobin 12.0 - 16.0 g/dL 11.5(L) 12.5 9.2(L)  Hematocrit 35.0 - 47.0 % 35.5 37.8 27.8(L)  Platelets 150 - 440 K/uL 174 166 157     Dg Abdomen 1 View  Result Date: 11/08/2016 CLINICAL DATA:  Femoral line placement, hypotension, chest pain, dialysis EXAM: ABDOMEN - 1 VIEW COMPARISON:  Lumbar spine films of 10/26/2016 and CT abdomen pelvis of 07/04/2016 FINDINGS: Supine views of the abdomen show large and small bowel gas to be present with no significant distension. Linear scarring, atelectasis, or pneumonia is noted at lung bases left-greater-than-right. Surgical clips are present in the right upper quadrant from prior cholecystectomy. The right femoral venous line tip extends to the L4  level. IMPRESSION: 1. Right femoral venous catheter tip extends to L4. 2. No bowel obstruction. Electronically Signed   By: Dwyane Dee M.D.   On: 11/08/2016 08:31   Ct Angio Chest Pe W And/or Wo Contrast  Result Date: 11/08/2016 CLINICAL DATA:  Chest pain. Pain for 2 days following dialysis. Hypotensive. EXAM: CT ANGIOGRAPHY CHEST WITH CONTRAST TECHNIQUE: Multidetector CT imaging of the chest was performed using the standard protocol during bolus administration of intravenous contrast. Multiplanar CT image reconstructions and MIPs were obtained to evaluate the vascular anatomy. CONTRAST:  75 mL Isovue COMPARISON:  CT 10/31/2016 FINDINGS: Cardiovascular: No filling defects within pulmonary artery to suggest acute pulmonary embolism. No acute findings aorta great vessels. Moderate size pleural effusion is present measuring 2 cm in thickness along the LEFT ventricle. Mediastinum/Nodes: No axillary adenopathy. LEFT supraclavicular node measures 11 mm (image 11, series 6). RIGHT lower paratracheal lymph node measures 9 mm. Prevascular lymph node measures 12 mm. Esophagus normal. Lungs/Pleura: There is  para bronchovascular thickening in the inferior LEFT lower lobe and mild consolidation. Small bilateral effusions. Pneumothorax. No pulmonary mass identified. Upper Abdomen: Limited view of the liver, kidneys, pancreas are unremarkable. Normal adrenal glands. Musculoskeletal: No aggressive osseous lesion. Severe compression fracture of the T6 vertebral body unchanged. Review of the MIP images confirms the above findings. IMPRESSION: 1. Moderate to large pericardial effusion is new from 10/31/2016 2. No evidence of pulmonary embolism. 3. New bilateral small effusions and LEFT basilar atelectasis versus infiltrate or pneumonitis. 4. Mild mediastinal and RIGHT supraclavicular adenopathy. Favor reactive adenopathy. Electronically Signed   By: Genevive Bi M.D.   On: 11/08/2016 09:34   Dg Chest Portable 1 View  Result  Date: 11/08/2016 CLINICAL DATA:  Pericardiocentesis. EXAM: PORTABLE CHEST 1 VIEW COMPARISON:  CT 11/08/2016.  Chest x-ray 11/08/2016. FINDINGS: Cardiomegaly with bilateral pulmonary interstitial prominence consistent with congestive heart failure. Small left pleural effusion. No pneumothorax. IMPRESSION: Congestive heart failure with bilateral pulmonary interstitial edema. Degree of cardiomegaly is unchanged from prior exam . Electronically Signed   By: Maisie Fus  Register   On: 11/08/2016 12:13   Dg Chest Portable 1 View  Result Date: 11/08/2016 CLINICAL DATA:  Chest pain. EXAM: PORTABLE CHEST 1 VIEW COMPARISON:  Radiographs of October 31, 2016. FINDINGS: Stable cardiomediastinal silhouette. Both lungs are clear. No pneumothorax or pleural effusion is noted. The visualized skeletal structures are unremarkable. IMPRESSION: No acute cardiopulmonary abnormality seen. Electronically Signed   By: Lupita Raider, M.D.   On: 11/08/2016 08:32    Review of Systems  Constitutional: Positive for malaise/fatigue. Negative for chills and fever.  HENT: Negative for nosebleeds and sore throat.        Vision impairment since childhood  Eyes:       Vision impairment since childhood  Respiratory: Positive for shortness of breath. Negative for cough and hemoptysis.   Cardiovascular: Positive for chest pain and palpitations.  Gastrointestinal: Negative.   Musculoskeletal: Positive for joint pain.       Chronic hip pain  Skin: Negative.   Neurological: Positive for headaches. Negative for dizziness and focal weakness.       Chronic headaches   Blood pressure 99/62, pulse 90, temperature 97.2 F (36.2 C), temperature source Oral, resp. rate 13, height 4\' 10"  (1.473 m), last menstrual period 11/21/2007, SpO2 96 %. Physical Exam  Nursing note and vitals reviewed. Constitutional: She appears well-nourished. No distress.  Short statured and obese  HENT:  Head: Normocephalic and atraumatic.  Mouth/Throat: Oropharynx  is clear and moist.  Eyes:  Bilateral upward gaze  Neck: Normal range of motion. Neck supple. No JVD present.  Cardiovascular: Normal rate, regular rhythm and normal heart sounds.   No murmur heard. Respiratory: Effort normal and breath sounds normal. She has no wheezes. She has no rales.  GI: Soft. Bowel sounds are normal. There is no tenderness. There is no rebound.  Musculoskeletal: She exhibits edema.  Right upper arm AVG with good thrill and bruit  Neurological:  Somnolent after receiving sedation for pericardiocentesis  Skin: Skin is warm and dry. No erythema.    Assessment/Plan: 1. Pericardial effusion/tamponade: Improved hemodynamic parameters status post pericardiocentesis- ICU nurse attempting to wean off dobutamine and Levophed at this time. Discussed with the patient the importance of completing her entire dialysis treatment in order to help with clearance/fluid management which is essential to avoid recurrence of effusions/ascites associated with suboptimal clearance/uremia. 2. End-stage renal disease: Usually on a Tuesday/Thursday/Saturday schedule-will order for hemodialysis later today hopefully after  she is successfully weaned off of pressors. No acute electrolyte abnormality and she does not have any critical volume overload in spite of getting 5 L of intravenous fluid to manage her hypotension. 3. Hyponatremia: Likely secondary to current volume status/free water intake between dialysis treatments. Monitor with hemodialysis. 4. Hypoalbuminemia: Discussed optimal compliance with hemodialysis and continued nutritional supplementation. 5. Anemia of chronic kidney disease: Hemoglobin appears to be within acceptable limits-recently 11.5. We'll monitor closely status post pericardiocentesis. Not on ESA at this time. 6. Metabolic bone disease: Will add phosphorus levels to labs drawn earlier and reconcile medications for phosphorus binders.  Usman Millett K. 11/08/2016, 4:42 PM

## 2016-11-08 NOTE — ED Notes (Signed)
Cardiology at bedside with Echo tech to perform Pericardiocentesis. 38ml of yellow colored fluid was drained, sent to lab.

## 2016-11-08 NOTE — Progress Notes (Signed)
Pt refused dialysis stating it was too late and " I'm fine", Dr Eliott Nine notified. Pt's o2 sat. 93% and pt has no apparent signs of respiratory distress.

## 2016-11-08 NOTE — Consult Note (Signed)
[address]       [place] [address]             [phone]        Erin [name] [facility] Medical Record #[id number] Date of Birth: 02-06-1971  Referring: Adrian Blackwater, MD Primary Care: [facility] PRIMARY CARE  Chief Complaint:  Moderate to [name] pericardial effusion   History of Present Illness:     This is a 46 y.o. female with a known history of ESRD on HD, legally blind who presented to [facility] with complaints of chest pain and shortness of breath. Chest x ray showed no acute cardiopulmonary abnormality seen. CT done showed moderate to [name] pericardial effusion, NO pulmonary embolus, small bilateral pleural effusion, and mild mediastinal and right supraclavicular adenopathy (likely reactive).Echo done and according to Dr. Welton Flakes, it showed right ventricular collapse and [name] pericardial effusion. Patient had a pericardiocentesis at bedside today. Approximately 80 cc of straw colored fluid was removed and apparently, there was no more right ventricular collapse.Request was obtained to transfer the patient to [facility] for further evaluation and treatment.  Current Activity/ Functional Status: Patient is independent with mobility/ambulation, transfers, ADL's, IADL's.   Zubrod Score: At the time of surgery this patient's most appropriate activity status/level should be described as: []     0    Normal activity, no symptoms []     1    Restricted in physical strenuous activity but ambulatory, able to do out light work []     2    Ambulatory and capable of self care, unable to do work activities, up and about                 more than 50%  Of the time                            []     3    Only limited self care, in bed greater than 50% of waking hours []     4    Completely disabled, no self care, confined to bed or chair []     5    Moribund  Past Medical History:  Diagnosis Date  . Anxiety   . Asthma   . Bipolar affect,  depressed (HCC)   . Blind   . Chronic headaches    Seeing Pain Management  . Constipation   . Depression   . Epilepsy (HCC)   . Fibromyalgia   . History of CVA (cerebrovascular accident)   . History of DVT (deep vein thrombosis)   . Hyperlipidemia   . Hypothyroid   . Kidney failure    Currently on Dialysis  . Narcolepsy   . 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 Psychosis   . Renal insufficiency     Past Surgical History:  Procedure Laterality Date  . BRAIN SURGERY  1976  . BREAST LUMPECTOMY  2001  . CARDIAC CATHETERIZATION Right 10/03/2016   Procedure: Coronary Angiography;  Surgeon: Laurier Nancy, MD;  Location: [facility] INVASIVE CV LAB;  Service: Cardiovascular;  Laterality: Right;  . CHOLECYSTECTOMY  2001  . DG AV DIALYSIS GRAFT DECLOT OR     X 4  . GIVENS CAPSULE STUDY N/A 02/29/2016   Procedure: GIVENS CAPSULE STUDY;  Surgeon: Elnita Maxwell, MD;  Location: [facility] ENDOSCOPY;  Service: Endoscopy;  Laterality: N/A;  . PERIPHERAL VASCULAR CATHETERIZATION N/A 08/21/2015  Procedure: Dialysis/Perma Catheter Insertion;  Surgeon: Renford Dills, MD;  Location: [facility] INVASIVE CV LAB;  Service: Cardiovascular;  Laterality: N/A;  . PERIPHERAL VASCULAR CATHETERIZATION N/A 02/29/2016   Procedure: Dialysis/Perma Catheter Removal;  Surgeon: Annice Needy, MD;  Location: [facility] INVASIVE CV LAB;  Service: Cardiovascular;  Laterality: N/A;  . PERIPHERAL VASCULAR CATHETERIZATION N/A 10/28/2016   Procedure: A/V Fistulagram;  Surgeon: Renford Dills, MD;  Location: [facility] INVASIVE CV LAB;  Service: Cardiovascular;  Laterality: N/A;  . PERIPHERAL VASCULAR CATHETERIZATION N/A 10/28/2016   Procedure: A/V Shunt Intervention;  Surgeon: Renford Dills, MD;  Location: [facility] INVASIVE CV LAB;  Service: Cardiovascular;  Laterality: N/A;  . PITUITARY EXCISION  1976    Social History   Social History  . Marital status: Divorced    Spouse name: N/A  . Number  of children: N/A  . Years of education: N/A   Occupational History  . Not on file.   Social History Main Topics  . Smoking status: Current Every Day Smoker    Packs/day: 0.50    Years: 16.00    Types: Cigarettes  . Smokeless tobacco: Never Used  . Alcohol use 6.0 oz/week    10 Shots of liquor per week     Comment: per patient drinks liquor twice a week  . Drug use: No  . Sexual activity: Not on file    Allergies  Allergen Reactions  . Dhea [Nutritional Supplements] Anaphylaxis    Patient states medication is DHE for migraines, not DHEA  . Demerol [Meperidine] Other (See Comments)    Reaction:  Hallucinations   . Floxin [Ofloxacin] Other (See Comments)    Reaction:  Hallucinations   . Nsaids Nausea And Vomiting and Swelling  . Nubain [Nalbuphine Hcl] Other (See Comments)    Reaction:  Hallucinations   . Phenergan [Promethazine Hcl] Other (See Comments)    Reaction:  Restless legs   . Stadol [Butorphanol] Other (See Comments)    Reaction:  Hallucinations   . Erythromycin Diarrhea and Rash     Current Outpatient Prescriptions  Medication Sig Dispense Refill  . estradiol (ESTRACE) 0.1 MG/GM vaginal cream Place 1 Applicatorful vaginally 2 (two) times a week. Mondays and Thursdays    . fenofibrate (TRICOR) 145 MG tablet Take 145 mg by mouth every other day.    . fludrocortisone (FLORINEF) 0.1 MG tablet Take 0.1 mg by mouth daily.    . furosemide (LASIX) 40 MG tablet Take 40 mg by mouth 2 (two) times daily.    Marland Kitchen gabapentin (NEURONTIN) 100 MG capsule Take 400 mg by mouth at bedtime.    . hydrocortisone (CORTEF) 10 MG tablet Take 5-10 mg by mouth 2 (two) times daily. Pt takes 10 mg in the morning and 5 mg at bedtime.    Marland Kitchen levothyroxine (SYNTHROID, LEVOTHROID) 112 MCG tablet Take 112 mcg by mouth daily.     Marland Kitchen loratadine (CLARITIN) 10 MG tablet Take 10 mg by mouth daily.    Marland Kitchen lovastatin (MEVACOR) 40 MG tablet Take 20 mg by mouth every evening.     . Melatonin 5 MG TABS Take 2  tablets by mouth at bedtime.    . methylphenidate (RITALIN LA) 30 MG 24 hr capsule Take 30 mg by mouth every morning.    . nystatin ointment (MYCOSTATIN) Apply 1 application topically 2 (two) times daily. Apply to affected area (groin, abdominal fold). 30 g 0  . ondansetron (ZOFRAN) 4 MG tablet Take 1 tablet (4 mg total) by mouth  every 8 (eight) hours as needed for nausea or vomiting. (Patient not taking: Reported on 10/26/2016) 20 tablet 0  . oxyCODONE (OXY IR/ROXICODONE) 5 MG immediate release tablet Take 1 tablet (5 mg total) by mouth every 6 (six) hours as needed for severe pain. 6 tablet 0  . pantoprazole (PROTONIX) 40 MG tablet Take 1 tablet (40 mg total) by mouth daily. 30 tablet 0  . risperidone (RISPERDAL) 4 MG tablet Take 2-4 mg by mouth 2 (two) times daily. Pt takes 4 mg in the morning and 2 mg at bedtime.    . sertraline (ZOLOFT) 50 MG tablet Take 50 mg by mouth daily.    . sevelamer carbonate (RENVELA) 800 MG tablet Take 2,400 mg by mouth 2 (two) times daily.     Marland Kitchen topiramate (TOPAMAX) 200 MG tablet Take 200 mg by mouth 2 (two) times daily. Pt takes with a 50 mg tablet.    . topiramate (TOPAMAX) 50 MG tablet Take 50 mg by mouth 2 (two) times daily. Pt takes with a 200 mg tablet.    . traZODone (DESYREL) 100 MG tablet Take 100 mg by mouth at bedtime.    . Vitamin D, Ergocalciferol, (DRISDOL) 50000 UNITS CAPS capsule Take 50,000 Units by mouth every 30 (thirty) days. Takes on the 8th of every month     Facility-Administered Medications Ordered in Other Encounters  Medication Dose Route Frequency Provider Last Rate Last Dose  . DOBUTamine (DOBUTREX) infusion 4000 mcg/mL  2.5-20 mcg/kg/min Intravenous Titrated Sharman Cheek, MD 15 mL/hr at 11/08/16 0948 15 mcg/kg/min at 11/08/16 0948  . lidocaine (PF) (XYLOCAINE) 1 % injection             Family History  Problem Relation Age of Onset  . Heart attack Mother   . Migraines Mother   . Aneurysm Mother   . Diabetes Father    Review  of Systems:      Cardiac Review of Systems: Y or N  Chest Pain [ Y-left sided   ]  Resting SOB [ Y  ] Exertional SOB  [Y  ]  Syncope  Klaus.Mock  ]   Presyncope [  N ]  General Review of Systems: [Y] = yes [ N ]=no Resp: cough [ N ];  wheezing[ N ];  hemoptysis[ N ]; GI: vomiting[ N ];  dysphagia[N  ]; melena[  N];  hematochezia Klaus.Mock  ]; heartburn[ Y ];    Musculosketetal: chronic pain and neuropathy [Y]  Neuro: CVA [Y]  Psych:depression[ Y ];   Endocrine: diabetes[  N];  thyroid dysfunction[ Y ];    Physical Exam: BP (!) 62/36   Pulse (!) 102   Temp 97.1 F (36.2 C) (Oral)   Resp (!) 34   Ht 4\' 10"  (1.473 m)   LMP 11/21/2007 (Approximate) Comment: No period since 2009,due to"DVT" per pt since they stopped BCP...no periods  SpO2 97%   BMI 30.72 kg/m    General appearance: alert, cooperative and appears older than stated age Head: Normocephalic, without obvious abnormality, atraumatic Resp: Slightly decreased left base and right lung is clear Cardio: Slightly tachycardic, distant heart sounds Extremities: Mild LE edema  Diagnostic Studies & Laboratory data:     Recent Radiology Findings:   Dg Abdomen 1 View  Result Date: 11/08/2016 CLINICAL DATA:  Femoral line placement, hypotension, chest pain, dialysis EXAM: ABDOMEN - 1 VIEW COMPARISON:  Lumbar spine films of 10/26/2016 and CT abdomen pelvis of 07/04/2016 FINDINGS: Supine views of the abdomen show [name]  and small bowel gas to be present with no significant distension. Linear scarring, atelectasis, or pneumonia is noted at lung bases left-greater-than-right. Surgical clips are present in the right upper quadrant from prior cholecystectomy. The right femoral venous line tip extends to the L4 level. IMPRESSION: 1. Right femoral venous catheter tip extends to L4. 2. No bowel obstruction. Electronically Signed   By: Dwyane Dee M.D.   On: 11/08/2016 08:31   Ct Angio Chest Pe W And/or Wo Contrast  Result Date: 11/08/2016 CLINICAL DATA:   Chest pain. Pain for 2 days following dialysis. Hypotensive. EXAM: CT ANGIOGRAPHY CHEST WITH CONTRAST TECHNIQUE: Multidetector CT imaging of the chest was performed using the standard protocol during bolus administration of intravenous contrast. Multiplanar CT image reconstructions and MIPs were obtained to evaluate the vascular anatomy. CONTRAST:  75 mL Isovue COMPARISON:  CT 10/31/2016 FINDINGS: Cardiovascular: No filling defects within pulmonary artery to suggest acute pulmonary embolism. No acute findings aorta great vessels. Moderate size pleural effusion is present measuring 2 cm in thickness along the LEFT ventricle. Mediastinum/Nodes: No axillary adenopathy. LEFT supraclavicular node measures 11 mm (image 11, series 6). RIGHT lower paratracheal lymph node measures 9 mm. Prevascular lymph node measures 12 mm. Esophagus normal. Lungs/Pleura: There is para bronchovascular thickening in the inferior LEFT lower lobe and mild consolidation. Small bilateral effusions. Pneumothorax. No pulmonary mass identified. Upper Abdomen: Limited view of the liver, kidneys, pancreas are unremarkable. Normal adrenal glands. Musculoskeletal: No aggressive osseous lesion. Severe compression fracture of the T6 vertebral body unchanged. Review of the MIP images confirms the above findings. IMPRESSION: 1. Moderate to [name] pericardial effusion is new from 10/31/2016 2. No evidence of pulmonary embolism. 3. New bilateral small effusions and LEFT basilar atelectasis versus infiltrate or pneumonitis. 4. Mild mediastinal and RIGHT supraclavicular adenopathy. Favor reactive adenopathy. Electronically Signed   By: Genevive Bi M.D.   On: 11/08/2016 09:34   Dg Chest Portable 1 View  Result Date: 11/08/2016 CLINICAL DATA:  Pericardiocentesis. EXAM: PORTABLE CHEST 1 VIEW COMPARISON:  CT 11/08/2016.  Chest x-ray 11/08/2016. FINDINGS: Cardiomegaly with bilateral pulmonary interstitial prominence consistent with congestive heart failure.  Small left pleural effusion. No pneumothorax. IMPRESSION: Congestive heart failure with bilateral pulmonary interstitial edema. Degree of cardiomegaly is unchanged from prior exam . Electronically Signed   By: Maisie Fus  Register   On: 11/08/2016 12:13   Dg Chest Portable 1 View  Result Date: 11/08/2016 CLINICAL DATA:  Chest pain. EXAM: PORTABLE CHEST 1 VIEW COMPARISON:  Radiographs of October 31, 2016. FINDINGS: Stable cardiomediastinal silhouette. Both lungs are clear. No pneumothorax or pleural effusion is noted. The visualized skeletal structures are unremarkable. IMPRESSION: No acute cardiopulmonary abnormality seen. Electronically Signed   By: Lupita Raider, M.D.   On: 11/08/2016 08:32     I have independently reviewed the above radiologic studies.  Recent Lab Findings: Lab Results  Component Value Date   WBC 11.6 (H) 11/08/2016   HGB 11.5 (L) 11/08/2016   HCT 35.5 11/08/2016   PLT 174 11/08/2016   GLUCOSE 107 (H) 11/08/2016   ALT <5 (L) 11/08/2016   AST 27 11/08/2016   NA 133 (L) 11/08/2016   K 4.2 11/08/2016   CL 102 11/08/2016   CREATININE 8.67 (H) 11/08/2016   BUN 54 (H) 11/08/2016   CO2 18 (L) 11/08/2016   TSH 0.111 (L) 10/02/2016   INR 1.33 11/08/2016   HGBA1C <4.2 (L) 10/02/2016   Assessment / Plan:      1.Pericardial effusion-she will require a  pericardiocentesis in cath lab. Etiology of pericardial effusion likely related to ESRD. 2. ESRD-on HD for last 5 years. Her HD days are Tuesday, Thursday, Saturday. 3. 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 Hypothyroidism-On Levothyroxine 112 mcg daily 5. Bipolar affect and depression-On Risperidone 4 mg in an am and 2 mg in pm, Zoloft 50 mg daily, Trazodone 100 mg at hs    Doree Fudge PA-C 11/08/2016 2:19 PM  I was waiting for patient on arrival and after reviewing the echo done at [facility] prior to tap  And seeing patient  contacted cardiology to do pericardicentesis and "LEAVE DRAIN IN PLACE" , reviewed with Dr Excell Seltzer and both agree tap and leaving drain in place is the course to take at this time.  I have seen and examined Erin [name] and agree with the above assessment  and plan.  Delight Ovens MD Beeper [phone] Office [phone] 11/08/2016 6:13 PM

## 2016-11-08 NOTE — Progress Notes (Signed)
Erin [name] is a 46 y.o. female  [id number]  Primary Cardiologist: Jackson Latino Reason for Consultation: Cardiac Tamponade  HPI: [id number] presented with chest pain and shortness of breath and found to have BPS 60. Bedside echo revealed RV collapse and large anterior and posterior PE.  Review of Systems:PND/Orhopnea and dizzinessss   Past Medical History:  Diagnosis Date  . Anxiety   . Asthma   . Bipolar affect, depressed (HCC)   . Blind   . Chronic headaches    Seeing Pain Management  . Constipation   . Depression   . Epilepsy (HCC)   . Fibromyalgia   . History of CVA (cerebrovascular accident)   . History of DVT (deep vein thrombosis)   . Hyperlipidemia   . Hypothyroid   . Kidney failure    Currently on Dialysis  . Narcolepsy   . 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 Psychosis   . Renal insufficiency      (Not in a hospital admission)   . lidocaine (PF)        Infusions: . DOBUTamine 15 mcg/kg/min (11/08/16 0948)  . piperacillin-tazobactam 3.375 g (11/08/16 1135)  . vancomycin      Allergies  Allergen Reactions  . Dhea [Nutritional Supplements] Anaphylaxis    Patient states medication is DHE for migraines, not DHEA  . Demerol [Meperidine] Other (See Comments)    Reaction:  Hallucinations   . Floxin [Ofloxacin] Other (See Comments)    Reaction:  Hallucinations   . Nsaids Nausea And Vomiting and Swelling  . Nubain [Nalbuphine Hcl] Other (See Comments)    Reaction:  Hallucinations   . Phenergan [Promethazine Hcl] Other (See Comments)    Reaction:  Restless legs   . Stadol [Butorphanol] Other (See Comments)    Reaction:  Hallucinations   . Erythromycin Diarrhea and Rash    Social History   Social History  . Marital status: Divorced    Spouse name: N/A  . Number of children: N/A  . Years of education: N/A   Occupational History  . Not on file.   Social History Main Topics  . Smoking status:  Current Every Day Smoker    Packs/day: 0.50    Years: 16.00    Types: Cigarettes  . Smokeless tobacco: Never Used  . Alcohol use 6.0 oz/week    10 Shots of liquor per week     Comment: per patient drinks liquor twice a week  . Drug use: No  . Sexual activity: Not on file   Other Topics Concern  . Not on file   Social History Narrative  . No narrative on file    Family History  Problem Relation Age of Onset  . Heart attack Mother   . Migraines Mother   . Aneurysm Mother   . Diabetes Father     PHYSICAL EXAM: Vitals:   11/08/16 1115 11/08/16 1130  BP: (!) 59/35 (!) 61/35  Pulse: 94 94  Resp: (!) 30 (!) 31  Temp:       Intake/Output Summary (Last 24 hours) at 11/08/16 1152 Last data filed at 11/08/16 0838  Gross per 24 hour  Intake             2000 ml  Output               85 ml  Net             1915 ml    General:  Well appearing.  No respiratory difficulty HEENT: normal Neck: supple. no JVD. Carotids 2+ bilat; no bruits. No lymphadenopathy or thryomegaly appreciated. Cor: PMI nondisplaced. Regular rate & rhythm. No rubs, gallops or murmurs. Lungs: clear Abdomen: soft, nontender, nondistended. No hepatosplenomegaly. No bruits or masses. Good bowel sounds. Extremities: no cyanosis, clubbing, rash, edema Neuro: alert & oriented x 3, cranial nerves grossly intact. moves all 4 extremities w/o difficulty. Affect pleasant.  ECG: NSR RBBB, low voltage.  Results for orders placed or performed during the hospital encounter of 11/08/16 (from the past 24 hour(s))  Comprehensive metabolic panel     Status: Abnormal   Collection Time: 11/08/16  7:33 AM  Result Value Ref Range   Sodium 133 (L) 135 - 145 mmol/L   Potassium 4.2 3.5 - 5.1 mmol/L   Chloride 102 101 - 111 mmol/L   CO2 18 (L) 22 - 32 mmol/L   Glucose, Bld 107 (H) 65 - 99 mg/dL   BUN 54 (H) 6 - 20 mg/dL   Creatinine, Ser 1.61 (H) 0.44 - 1.00 mg/dL   Calcium 7.5 (L) 8.9 - 10.3 mg/dL   Total Protein 5.6 (L)  6.5 - 8.1 g/dL   Albumin 2.9 (L) 3.5 - 5.0 g/dL   AST 27 15 - 41 U/L   ALT <5 (L) 14 - 54 U/L   Alkaline Phosphatase 125 38 - 126 U/L   Total Bilirubin 0.5 0.3 - 1.2 mg/dL   GFR calc non Af Amer 5 (L) >60 mL/min   GFR calc Af Amer 6 (L) >60 mL/min   Anion gap 13 5 - 15  CBC WITH DIFFERENTIAL     Status: Abnormal   Collection Time: 11/08/16  7:33 AM  Result Value Ref Range   WBC 11.6 (H) 3.6 - 11.0 K/uL   RBC 3.76 (L) 3.80 - 5.20 MIL/uL   Hemoglobin 11.5 (L) 12.0 - 16.0 g/dL   HCT 09.6 04.5 - 40.9 %   MCV 94.6 80.0 - 100.0 fL   MCH 30.5 26.0 - 34.0 pg   MCHC 32.2 32.0 - 36.0 g/dL   RDW 81.1 (H) 91.4 - 78.2 %   Platelets 174 150 - 440 K/uL   Neutrophils Relative % 82 %   Neutro Abs 9.5 (H) 1.4 - 6.5 K/uL   Lymphocytes Relative 12 %   Lymphs Abs 1.4 1.0 - 3.6 K/uL   Monocytes Relative 3 %   Monocytes Absolute 0.4 0.2 - 0.9 K/uL   Eosinophils Relative 2 %   Eosinophils Absolute 0.3 0 - 0.7 K/uL   Basophils Relative 1 %   Basophils Absolute 0.1 0 - 0.1 K/uL  Lactic acid, plasma     Status: Abnormal   Collection Time: 11/08/16  7:33 AM  Result Value Ref Range   Lactic Acid, Venous 2.4 (HH) 0.5 - 1.9 mmol/L  Troponin I     Status: None   Collection Time: 11/08/16  7:33 AM  Result Value Ref Range   Troponin I <0.03 <0.03 ng/mL  Brain natriuretic peptide     Status: Abnormal   Collection Time: 11/08/16  7:33 AM  Result Value Ref Range   B Natriuretic Peptide 393.0 (H) 0.0 - 100.0 pg/mL  Lipase, blood     Status: None   Collection Time: 11/08/16  7:33 AM  Result Value Ref Range   Lipase 27 11 - 51 U/L  Procalcitonin     Status: None   Collection Time: 11/08/16  7:33 AM  Result Value Ref Range   Procalcitonin  0.67 ng/mL  Type and screen [facility]     Status: None   Collection Time: 11/08/16  7:33 AM  Result Value Ref Range   ABO/RH(D) A POS    Antibody Screen NEG    Sample Expiration 11/11/2016   APTT     Status: Abnormal   Collection Time:  11/08/16  7:33 AM  Result Value Ref Range   aPTT 48 (H) 24 - 36 seconds  Protime-INR     Status: Abnormal   Collection Time: 11/08/16  7:33 AM  Result Value Ref Range   Prothrombin Time 16.6 (H) 11.4 - 15.2 seconds   INR 1.33   Urinalysis, Routine w reflex microscopic     Status: Abnormal   Collection Time: 11/08/16  7:36 AM  Result Value Ref Range   Color, Urine YELLOW (A) YELLOW   APPearance CLEAR (A) CLEAR   Specific Gravity, Urine 1.006 1.005 - 1.030   pH 8.0 5.0 - 8.0   Glucose, UA NEGATIVE NEGATIVE mg/dL   Hgb urine dipstick NEGATIVE NEGATIVE   Bilirubin Urine NEGATIVE NEGATIVE   Ketones, ur NEGATIVE NEGATIVE mg/dL   Protein, ur 086 (A) NEGATIVE mg/dL   Nitrite NEGATIVE NEGATIVE   Leukocytes, UA NEGATIVE NEGATIVE   RBC / HPF 0-5 0 - 5 RBC/hpf   WBC, UA 0-5 0 - 5 WBC/hpf   Bacteria, UA RARE (A) NONE SEEN   Squamous Epithelial / LPF 6-30 (A) NONE SEEN  Glucose, capillary     Status: None   Collection Time: 11/08/16  7:38 AM  Result Value Ref Range   Glucose-Capillary 94 65 - 99 mg/dL   Comment 1 Notify RN    Dg Abdomen 1 View  Result Date: 11/08/2016 CLINICAL DATA:  Femoral line placement, hypotension, chest pain, dialysis EXAM: ABDOMEN - 1 VIEW COMPARISON:  Lumbar spine films of 10/26/2016 and CT abdomen pelvis of 07/04/2016 FINDINGS: Supine views of the abdomen show large and small bowel gas to be present with no significant distension. Linear scarring, atelectasis, or pneumonia is noted at lung bases left-greater-than-right. Surgical clips are present in the right upper quadrant from prior cholecystectomy. The right femoral venous line tip extends to the L4 level. IMPRESSION: 1. Right femoral venous catheter tip extends to L4. 2. No bowel obstruction. Electronically Signed   By: Dwyane Dee M.D.   On: 11/08/2016 08:31   Ct Angio Chest Pe W And/or Wo Contrast  Result Date: 11/08/2016 CLINICAL DATA:  Chest pain. Pain for 2 days following dialysis. Hypotensive. EXAM: CT  ANGIOGRAPHY CHEST WITH CONTRAST TECHNIQUE: Multidetector CT imaging of the chest was performed using the standard protocol during bolus administration of intravenous contrast. Multiplanar CT image reconstructions and MIPs were obtained to evaluate the vascular anatomy. CONTRAST:  75 mL Isovue COMPARISON:  CT 10/31/2016 FINDINGS: Cardiovascular: No filling defects within pulmonary artery to suggest acute pulmonary embolism. No acute findings aorta great vessels. Moderate size pleural effusion is present measuring 2 cm in thickness along the LEFT ventricle. Mediastinum/Nodes: No axillary adenopathy. LEFT supraclavicular node measures 11 mm (image 11, series 6). RIGHT lower paratracheal lymph node measures 9 mm. Prevascular lymph node measures 12 mm. Esophagus normal. Lungs/Pleura: There is para bronchovascular thickening in the inferior LEFT lower lobe and mild consolidation. Small bilateral effusions. Pneumothorax. No pulmonary mass identified. Upper Abdomen: Limited view of the liver, kidneys, pancreas are unremarkable. Normal adrenal glands. Musculoskeletal: No aggressive osseous lesion. Severe compression fracture of the T6 vertebral body unchanged. Review of the MIP  images confirms the above findings. IMPRESSION: 1. Moderate to large pericardial effusion is new from 10/31/2016 2. No evidence of pulmonary embolism. 3. New bilateral small effusions and LEFT basilar atelectasis versus infiltrate or pneumonitis. 4. Mild mediastinal and RIGHT supraclavicular adenopathy. Favor reactive adenopathy. Electronically Signed   By: Genevive Bi M.D.   On: 11/08/2016 09:34   Dg Chest Portable 1 View  Result Date: 11/08/2016 CLINICAL DATA:  Chest pain. EXAM: PORTABLE CHEST 1 VIEW COMPARISON:  Radiographs of October 31, 2016. FINDINGS: Stable cardiomediastinal silhouette. Both lungs are clear. No pneumothorax or pleural effusion is noted. The visualized skeletal structures are unremarkable. IMPRESSION: No acute  cardiopulmonary abnormality seen. Electronically Signed   By: Lupita Raider, M.D.   On: 11/08/2016 08:32     ASSESSMENT AND PLAN: Pericardial tamponade. Had normal coronaries in 12/17 and LVEF was 40%. Had mild PE and now large. Had Pericardiocentesis at bedside and took out 80 cc staraw colored fluidbut not much Blood pressure but no more Rv collapse. Dr. Juliann Pares did pericardiocentesis. Patient needs to transfer for pericardial window.  Perri Aragones A

## 2016-11-08 NOTE — ED Notes (Signed)
Pts bed linen changed, one large liquid stool was cleaned from patient.

## 2016-11-08 NOTE — Anesthesia Preprocedure Evaluation (Deleted)
Anesthesia Evaluation    Airway        Dental   Pulmonary Current Smoker,           Cardiovascular + DVT  (-) CAD   11/07/16 ECHO at :  Pericardial tamponade. Had normal coronaries in 12/17 and LVEF was 40%. Had mild PE and now large. Had Pericardiocentesis at bedside and took out 80 cc staraw colored    Neuro/Psych Anxiety Depression Bipolar Disorder    GI/Hepatic GERD  Medicated,  Endo/Other  Hypothyroidism Morbid obesityPanhypopit: diabetes insipidus  Renal/GU ESRF and DialysisRenal disease     Musculoskeletal  (+) Fibromyalgia -  Abdominal   Peds  Hematology   Anesthesia Other Findings   Reproductive/Obstetrics                             Anesthesia Physical Anesthesia Plan Anesthesia Quick Evaluation

## 2016-11-08 NOTE — H&P (Signed)
PULMONARY / CRITICAL CARE MEDICINE   Name: Erin [name] MRN: [id number] DOB: Aug 01, 1971    ADMISSION DATE:  11/08/2016 CONSULTATION DATE:  11/08/16  REFERRING MD:  [facility] / Dr. Welton Flakes  CHIEF COMPLAINT:  Chest Pain  HISTORY OF PRESENT ILLNESS:  46 y/o F, smoker, with a h/o CVA, DVT (not on anticoagulation), kidney failure (on dialysis T,Th,S), anxiety, asthma, epilepsy, blindness (since childhood), CP with a recent cardiac catheterization (10/28/16) which was unremarkable for coronary disease and recent ER visit at [facility] on 1/7 for chest pain.  No acute findings were noted and she was discharged home.  She continued to have chest pain at home.    She returned to the ER again on 1/9 for evaluation. The patient reported ongoing chest pain that she describes as pressure and shortness of breath.  She was found to be hypotensive in the ER with pressures in the 60's.  Chest CT was evaluated and was negative for PE but showed a moderate size pericardial effusion, and left pleural effusion.  CXR on 1/9 showed bilateral pulmonary interstitial edema and cardiomegaly.  ECHO was performed which confirmed pericardial effusion with concern for RV collapse.  Per chart review, Dr. Welton Flakes performed a bedside pericardiocentesis which removed 80 cc straw colored fluid without a change in BP but with decreased RV collapse. She was subsequently transferred to [facility] for further evaluation.  PCCM accepting for admission.   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  PAST SURGICAL HISTORY: She  has a past surgical history that  includes Brain surgery (1976); Cholecystectomy (2001); Breast lumpectomy (2001); Pituitary excision (1976); DG AV DIALYSIS GRAFT DECLOT OR; Cardiac catheterization (N/A, 08/21/2015); Cardiac catheterization (N/A, 02/29/2016); Givens capsule study (N/A, 02/29/2016); Cardiac catheterization (Right, 10/03/2016); Cardiac catheterization (N/A, 10/28/2016); and Cardiac catheterization (N/A, 10/28/2016).  Allergies  Allergen Reactions  . Dhea [Nutritional Supplements] Anaphylaxis    Patient states medication is DHE for migraines, not DHEA  . Demerol [Meperidine] Other (See Comments)    Reaction:  Hallucinations   . Floxin [Ofloxacin] Other (See Comments)    Reaction:  Hallucinations   . Nsaids Nausea And Vomiting and Swelling  . Nubain [Nalbuphine Hcl] Other (See Comments)    Reaction:  Hallucinations   . Phenergan [Promethazine Hcl] Other (See Comments)    Reaction:  Restless legs   . Stadol [Butorphanol] Other (See Comments)    Reaction:  Hallucinations   . Erythromycin Diarrhea and Rash    No current facility-administered medications on file prior to encounter.    Current Outpatient Prescriptions on File Prior to Encounter  Medication Sig  . estradiol (ESTRACE) 0.1 MG/GM vaginal cream Place 1 Applicatorful vaginally 2 (two) times a week. Mondays and Thursdays  . fenofibrate (TRICOR) 145 MG tablet Take 145 mg by mouth every other day.  . fludrocortisone (FLORINEF) 0.1 MG tablet Take 0.1 mg by mouth daily.  . furosemide (LASIX) 40 MG tablet Take 40 mg by mouth 2 (two) times daily.  Marland Kitchen gabapentin (NEURONTIN) 100 MG capsule Take 400 mg by mouth at bedtime.  . hydrocortisone (CORTEF) 10 MG tablet Take 5-10 mg by mouth 2 (two) times daily. Pt takes 10 mg in the morning and 5 mg at bedtime.  Marland Kitchen levothyroxine (SYNTHROID,  LEVOTHROID) 112 MCG tablet Take 112 mcg by mouth daily.   Marland Kitchen loratadine (CLARITIN) 10 MG tablet Take 10 mg by mouth daily.  Marland Kitchen lovastatin (MEVACOR) 40 MG tablet Take 20 mg by mouth every  evening.   . Melatonin 5 MG TABS Take 2 tablets by mouth at bedtime.  . methylphenidate (RITALIN LA) 30 MG 24 hr capsule Take 30 mg by mouth every morning.  . nystatin ointment (MYCOSTATIN) Apply 1 application topically 2 (two) times daily. Apply to affected area (groin, abdominal fold).  . ondansetron (ZOFRAN) 4 MG tablet Take 1 tablet (4 mg total) by mouth every 8 (eight) hours as needed for nausea or vomiting. (Patient not taking: Reported on 10/26/2016)  . oxyCODONE (OXY IR/ROXICODONE) 5 MG immediate release tablet Take 1 tablet (5 mg total) by mouth every 6 (six) hours as needed for severe pain.  . pantoprazole (PROTONIX) 40 MG tablet Take 1 tablet (40 mg total) by mouth daily.  . risperidone (RISPERDAL) 4 MG tablet Take 2-4 mg by mouth 2 (two) times daily. Pt takes 4 mg in the morning and 2 mg at bedtime.  . sertraline (ZOLOFT) 50 MG tablet Take 50 mg by mouth daily.  . sevelamer carbonate (RENVELA) 800 MG tablet Take 2,400 mg by mouth 2 (two) times daily.   Marland Kitchen topiramate (TOPAMAX) 200 MG tablet Take 200 mg by mouth 2 (two) times daily. Pt takes with a 50 mg tablet.  . topiramate (TOPAMAX) 50 MG tablet Take 50 mg by mouth 2 (two) times daily. Pt takes with a 200 mg tablet.  . traZODone (DESYREL) 100 MG tablet Take 100 mg by mouth at bedtime.  . Vitamin D, Ergocalciferol, (DRISDOL) 50000 UNITS CAPS capsule Take 50,000 Units by mouth every 30 (thirty) days. Takes on the 8th of every month    FAMILY HISTORY:  Her indicated that her mother is alive. She indicated that her father is alive.    SOCIAL HISTORY: She  reports that she has been smoking Cigarettes.  She has a 8.00 pack-year smoking history. She has never used smokeless tobacco. She reports that she drinks about 6.0 oz of alcohol per week . She reports that she does not use drugs.  REVIEW OF SYSTEMS:  POSITIVES IN BOLD Gen: Denies fever, chills, weight change, fatigue, night sweats HEENT: Denies blurred vision, double vision,  hearing loss, tinnitus, sinus congestion, rhinorrhea, sore throat, neck stiffness, dysphagia PULM: Denies shortness of breath, cough, sputum production, hemoptysis, wheezing CV: Denies chest pain, edema, orthopnea, paroxysmal nocturnal dyspnea, palpitations GI: Denies abdominal pain, nausea, vomiting, diarrhea, hematochezia, melena, constipation, change in bowel habits GU: Denies dysuria, hematuria, polyuria, oliguria, urethral discharge Endocrine: Denies hot or cold intolerance, polyuria, polyphagia or appetite change Derm: Denies rash, dry skin, scaling or peeling skin change Heme: Denies easy bruising, bleeding, bleeding gums Neuro: Denies headache, numbness, weakness, slurred speech, loss of memory or consciousness   SUBJECTIVE: Pt continues to report chest pain - describes as a pressure  VITAL SIGNS: LMP 11/21/2007 (Approximate) Comment: No period since 2009,due to"DVT" per pt since they stopped BCP...no periods  HEMODYNAMICS:    VENTILATOR SETTINGS:    INTAKE / OUTPUT: No intake/output data recorded.  PHYSICAL EXAMINATION: General:  Chronically ill appearing female in NAD  Neuro:  Awake, alert, oriented, speech clear, MAE HEENT:  MM pink/moist, positive jvd with hepatojugular reflex Cardiovascular:  s1s2 regular, muffled tones Lungs:  Even/non-labored, lungs bilaterally clear Abdomen:  Obese/soft, bsx4 active  Musculoskeletal:  No acute deformities  Skin:  Warm/dry, no edema, multiple old graft sites, RUE AVF with positive bruit, unable to palpate thrill  LABS:  BMET  Recent Labs Lab 11/06/16 0635 11/08/16 0733  NA 134* 133*  K 5.6* 4.2  CL 100* 102  CO2 22 18*  BUN 46* 54*  CREATININE 6.50* 8.67*  GLUCOSE 53* 107*    Electrolytes  Recent Labs Lab 11/06/16 0635 11/08/16 0733  CALCIUM 8.7* 7.5*    CBC  Recent Labs Lab 11/06/16 0635 11/08/16 0733  WBC 6.0 11.6*  HGB 12.5 11.5*  HCT 37.8 35.5  PLT 166 174    Coag's  Recent Labs Lab  11/08/16 0733  APTT 48*  INR 1.33    Sepsis Markers  Recent Labs Lab 11/08/16 0733  LATICACIDVEN 2.4*  PROCALCITON 0.67    ABG No results for input(s): PHART, PCO2ART, PO2ART in the last 168 hours.  Liver Enzymes  Recent Labs Lab 11/06/16 0635 11/08/16 0733  AST 32 27  ALT <5* <5*  ALKPHOS 137* 125  BILITOT 0.6 0.5  ALBUMIN 3.9 2.9*    Cardiac Enzymes  Recent Labs Lab 11/06/16 0635 11/08/16 0733  TROPONINI <0.03 <0.03    Glucose  Recent Labs Lab 11/08/16 0738  GLUCAP 94    Imaging Dg Abdomen 1 View  Result Date: 11/08/2016 CLINICAL DATA:  Femoral line placement, hypotension, chest pain, dialysis EXAM: ABDOMEN - 1 VIEW COMPARISON:  Lumbar spine films of 10/26/2016 and CT abdomen pelvis of 07/04/2016 FINDINGS: Supine views of the abdomen show large and small bowel gas to be present with no significant distension. Linear scarring, atelectasis, or pneumonia is noted at lung bases left-greater-than-right. Surgical clips are present in the right upper quadrant from prior cholecystectomy. The right femoral venous line tip extends to the L4 level. IMPRESSION: 1. Right femoral venous catheter tip extends to L4. 2. No bowel obstruction. Electronically Signed   By: Dwyane Dee M.D.   On: 11/08/2016 08:31   Ct Angio Chest Pe W And/or Wo Contrast  Result Date: 11/08/2016 CLINICAL DATA:  Chest pain. Pain for 2 days following dialysis. Hypotensive. EXAM: CT ANGIOGRAPHY CHEST WITH CONTRAST TECHNIQUE: Multidetector CT imaging of the chest was performed using the standard protocol during bolus administration of intravenous contrast. Multiplanar CT image reconstructions and MIPs were obtained to evaluate the vascular anatomy. CONTRAST:  75 mL Isovue COMPARISON:  CT 10/31/2016 FINDINGS: Cardiovascular: No filling defects within pulmonary artery to suggest acute pulmonary embolism. No acute findings aorta great vessels. Moderate size pleural effusion is present measuring 2 cm in  thickness along the LEFT ventricle. Mediastinum/Nodes: No axillary adenopathy. LEFT supraclavicular node measures 11 mm (image 11, series 6). RIGHT lower paratracheal lymph node measures 9 mm. Prevascular lymph node measures 12 mm. Esophagus normal. Lungs/Pleura: There is para bronchovascular thickening in the inferior LEFT lower lobe and mild consolidation. Small bilateral effusions. Pneumothorax. No pulmonary mass identified. Upper Abdomen: Limited view of the liver, kidneys, pancreas are unremarkable. Normal adrenal glands. Musculoskeletal: No aggressive osseous lesion. Severe compression fracture of the T6 vertebral body unchanged. Review of the MIP images confirms the above findings. IMPRESSION: 1. Moderate to large pericardial effusion is new from 10/31/2016 2. No evidence of pulmonary embolism. 3. New bilateral small effusions and LEFT basilar atelectasis versus infiltrate or pneumonitis. 4. Mild mediastinal and RIGHT supraclavicular adenopathy. Favor reactive adenopathy. Electronically Signed   By: Genevive Bi M.D.   On: 11/08/2016 09:34   Dg Chest Portable 1 View  Result Date: 11/08/2016 CLINICAL DATA:  Pericardiocentesis. EXAM:  PORTABLE CHEST 1 VIEW COMPARISON:  CT 11/08/2016.  Chest x-ray 11/08/2016. FINDINGS: Cardiomegaly with bilateral pulmonary interstitial prominence consistent with congestive heart failure. Small left pleural effusion. No pneumothorax. IMPRESSION: Congestive heart failure with bilateral pulmonary interstitial edema. Degree of cardiomegaly is unchanged from prior exam . Electronically Signed   By: Maisie Fus  Register   On: 11/08/2016 12:13   Dg Chest Portable 1 View  Result Date: 11/08/2016 CLINICAL DATA:  Chest pain. EXAM: PORTABLE CHEST 1 VIEW COMPARISON:  Radiographs of October 31, 2016. FINDINGS: Stable cardiomediastinal silhouette. Both lungs are clear. No pneumothorax or pleural effusion is noted. The visualized skeletal structures are unremarkable. IMPRESSION: No acute  cardiopulmonary abnormality seen. Electronically Signed   By: Lupita Raider, M.D.   On: 11/08/2016 08:32    STUDIES:  CTA chest 01/09 > mod to large pericardial effusion, no PE, small b/l pleural effusions, mild right supraclavicular adenopathy. Echo 01/09 >   CULTURES: None.  ANTIBIOTICS: None.  SIGNIFICANT EVENTS: 01/09 > admit.  LINES/TUBES: R fem CVL 01/09 > Pericardial drain pending >  DISCUSSION: 46 y.o. female admitted with large pericardial effusion with concern for tamponade.  Was seen at [facility], had 80cc drained via pericardiocentesis then transferred to [facility] for further management.  ASSESSMENT / PLAN:  CARDIOVASCULAR A:  Shock - due to tamponade. Pericardial effusion with tamponade - s/p pericardiocentesis 01/09 with 80cc straw colored fluid drained. Effusion presumed due to ESRD. Hx HLD. P:  Continue levophed, dobutamine. Cardiology to take to cath lab emergently for pericardiocentesis. Continue preadmission fenofibrate, lovastatin.  RENAL A:   ESRD (T/Th/Sat - last dialyzed 01/06). Hyponatremia - presumed hypervolemic. P:   Fluids at [facility]. Nephrology consult. BMP in AM.  PULMONARY A: Hx Asthma. P:   BD's.  GASTROINTESTINAL A:   Nutrition. P:   NPO.  HEMATOLOGIC A:   VTE Prophylaxis. Hx DVT. P:  SCD's only for now. CBC in AM.  INFECTIOUS A:   No indication of infection. P:   No interventions required. Follow fluid analysis.  ENDOCRINE A:   Hx panhypopituitarism, hypothyroidism.  P:   Stress steroids. Continue preadmission synthroid, change to IV formulation. Hold preadmission florinef.  NEUROLOGIC A:   Hx psychosis / bipolar affect, narcolepsy, CVA, fibromyalgia, epilepsy, depression, chronic headaches, legally blind, anxiety. P:   Hold preadmission gabapentin, methylphenidate, oxy IR, risperidone, sertraline, topiramate, trazodone, melatonin.  Family updated: Pt's brother updated by Dr. Excell Seltzer regarding plan  of care.  Interdisciplinary Family Meeting v Palliative Care Meeting:  Due by: 11/14/16.  CC time: 45 minutes.   Rutherford Guys, PA - [facility] Pager: [phone]  or [phone] 11/08/2016, 2:27 PM  Attending:  I have seen and examined the patient with nurse practitioner/resident and agree with the note above.  We formulated the plan together and I elicited the following history.    Ms. Moskovitz presented with chest pain x 3 days, noted to be hypotensive, found to have tamponade.  Received pericardiocentesis, remained hypotensive so she was sent to [facility] for further management.   On exam Diminished heart tones Hypotensive Lungs clear to auscultation  Tamponade: for stat pericardial drain ESRD: consult renal Shock: due to tamponade, improved s/p pericardiocentesis Pan hypopit: will give stress dose steroids in addition to home meds  CC time 35 minutes  Heber La Conner, MD [place] [facility] Pager: [phone] Cell: [phone] After 3pm or if no response, call [phone]

## 2016-11-08 NOTE — Progress Notes (Signed)
eLink Physician-Brief Progress Note Patient Name: Erin [name] DOB: Jun 17, 1971 MRN: [id number]   Date of Service  11/08/2016  HPI/Events of Note  Patient with PMH of ESRD, Pericardial effusion - s/p window and chronic pain on Neurontin. Patient has allergies to several narcotics and NSAID.  eICU Interventions  Will order: 1. Restart home Neurontin 400 mg PO Q HS.  2. Tylenol 650 mg PO Q 6 hours PRN pain.      Intervention Category Intermediate Interventions: Pain - evaluation and management  Sommer,Steven Eugene 11/08/2016, 8:01 PM

## 2016-11-08 NOTE — ED Notes (Signed)
Pt transported to CT ?

## 2016-11-08 NOTE — CV Procedure (Signed)
Called to see pt in ER with Hypotension and pericardial effusion on CT There is concern for tamponade Dr Welton Flakes and Scotty Court ask for emergent percicardiacentesis  In the ER room 3 I found the pt to be hypotensive 75/45 HR70 Bedside ECHO by ECHO tech show large effusion Evidence of RV collapse Emergent  Pericardial tap was performed in ER After area was cleaned with betadine and prep A sterile field was established over her chest Sub xiphoid area was anesth with Lido 1% 10cc A long needle was inserted into the pericardial sac and 80 cc of tellow clear fluid was removed.without difficulty Post echo at bedside showed reduced effusion We recommended Fluid hydration and transfer to a tertiary care hospital.  Post CXR was ordered to r/o pneumo. Harvy Riera

## 2016-11-08 NOTE — ED Triage Notes (Signed)
Pt to ED from home via ems with reports of Chest pain that has been occurring since 2 days ago when she had dialysis. When ems arrived pts Bp was 36/15. EJ was placed and of fluid had been given. Pts CBG was 60 and oral glucose was given with recheck of 116.

## 2016-11-08 NOTE — ED Notes (Signed)
Pt back from CT

## 2016-11-08 NOTE — Progress Notes (Signed)
*  PRELIMINARY RESULTS* Echocardiogram 2D Echocardiogram has been performed.  Erin [name] 11/08/2016, 11:28 AM

## 2016-11-08 NOTE — ED Provider Notes (Signed)
[facility] Emergency Department Provider Note  ____________________________________________  Time seen: Approximately 8:06 AM  I have reviewed the triage vital signs and the nursing notes.   HISTORY  Chief Complaint Chest Pain and Hypotension Level 5 caveat:  Portions of the history and physical were unable to be obtained due to the patient's acute illness    HPI Erin [name] is a 46 y.o. female brought to the ED with chest pain. She reports she's had this for a couple of days. It is sharp and pleuritic and severe in the left side of the chest. Nonradiating. States that she feels short of breath and that it hurts to breathe. No trauma recently. Was seen in the ED 2 days ago for same. Recently had a cardiac catheterization and a CT scan of her chest which were unremarkable.     Past Medical History:  Diagnosis Date  . Anxiety   . Asthma   . Bipolar affect, depressed (HCC)   . Blind   . Chronic headaches    Seeing Pain Management  . Constipation   . Depression   . Epilepsy (HCC)   . Fibromyalgia   . History of CVA (cerebrovascular accident)   . History of DVT (deep vein thrombosis)   . Hyperlipidemia   . Hypothyroid   . Kidney failure    Currently on Dialysis  . Narcolepsy   . 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 Psychosis   . Renal insufficiency      Patient Active Problem List   Diagnosis Date Noted  . Intractable pain 10/26/2016  . Chest pain 10/02/2016  . Kidney failure   . Palliative care encounter   . Goals of care, counseling/discussion   . Hypoglycemia 06/27/2016  . Seizure disorder (HCC) 06/27/2016  . Blindness 06/27/2016  . Chronic pain 06/27/2016  . History of CVA (cerebrovascular accident) 06/27/2016  . Generalized weakness 03/04/2016  . Hypothyroidism 03/04/2016  . GERD (gastroesophageal reflux disease) 03/04/2016  . End stage renal disease on dialysis (HCC) 03/04/2016   . Bipolar 1 disorder, mixed, moderate (HCC) 02/26/2016  . 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 Anemia 02/25/2016     Past Surgical History:  Procedure Laterality Date  . BRAIN SURGERY  1976  . BREAST LUMPECTOMY  2001  . CARDIAC CATHETERIZATION Right 10/03/2016   Procedure: Coronary Angiography;  Surgeon: Laurier Nancy, MD;  Location: [facility] INVASIVE CV LAB;  Service: Cardiovascular;  Laterality: Right;  . CHOLECYSTECTOMY  2001  . DG AV DIALYSIS GRAFT DECLOT OR     X 4  . GIVENS CAPSULE STUDY N/A 02/29/2016   Procedure: GIVENS CAPSULE STUDY;  Surgeon: Elnita Maxwell, MD;  Location: [facility] ENDOSCOPY;  Service: Endoscopy;  Laterality: N/A;  . PERIPHERAL VASCULAR CATHETERIZATION N/A 08/21/2015   Procedure: Dialysis/Perma Catheter Insertion;  Surgeon: Renford Dills, MD;  Location: [facility] INVASIVE CV LAB;  Service: Cardiovascular;  Laterality: N/A;  . PERIPHERAL VASCULAR CATHETERIZATION N/A 02/29/2016   Procedure: Dialysis/Perma Catheter Removal;  Surgeon: Annice Needy, MD;  Location: [facility] INVASIVE CV LAB;  Service: Cardiovascular;  Laterality: N/A;  . PERIPHERAL VASCULAR CATHETERIZATION N/A 10/28/2016   Procedure: A/V Fistulagram;  Surgeon: Renford Dills, MD;  Location: [facility] INVASIVE CV LAB;  Service: Cardiovascular;  Laterality: N/A;  . PERIPHERAL VASCULAR CATHETERIZATION N/A 10/28/2016   Procedure: A/V Shunt Intervention;  Surgeon: Renford Dills, MD;  Location: [facility] INVASIVE CV LAB;  Service: Cardiovascular;  Laterality: N/A;  . PITUITARY EXCISION  1976     Prior to Admission medications   Medication Sig Start Date End Date Taking? Authorizing Provider  estradiol (ESTRACE) 0.1 MG/GM vaginal cream Place 1 Applicatorful vaginally 2 (two) times a week. Mondays and Thursdays    Historical Provider, MD  fenofibrate (TRICOR) 145 MG tablet Take 145 mg by mouth every other day.    Historical Provider, MD   fludrocortisone (FLORINEF) 0.1 MG tablet Take 0.1 mg by mouth daily.    Historical Provider, MD  furosemide (LASIX) 40 MG tablet Take 40 mg by mouth 2 (two) times daily.    Historical Provider, MD  gabapentin (NEURONTIN) 100 MG capsule Take 400 mg by mouth at bedtime.    Historical Provider, MD  hydrocortisone (CORTEF) 10 MG tablet Take 5-10 mg by mouth 2 (two) times daily. Pt takes 10 mg in the morning and 5 mg at bedtime.    Historical Provider, MD  levothyroxine (SYNTHROID, LEVOTHROID) 112 MCG tablet Take 112 mcg by mouth daily.     Historical Provider, MD  loratadine (CLARITIN) 10 MG tablet Take 10 mg by mouth daily.    Historical Provider, MD  lovastatin (MEVACOR) 40 MG tablet Take 20 mg by mouth every evening.     Historical Provider, MD  Melatonin 5 MG TABS Take 2 tablets by mouth at bedtime.    Historical Provider, MD  methylphenidate (RITALIN LA) 30 MG 24 hr capsule Take 30 mg by mouth every morning.    Historical Provider, MD  nystatin ointment (MYCOSTATIN) Apply 1 application topically 2 (two) times daily. Apply to affected area (groin, abdominal fold). 06/30/16   Standley Brooking, MD  ondansetron (ZOFRAN) 4 MG tablet Take 1 tablet (4 mg total) by mouth every 8 (eight) hours as needed for nausea or vomiting. Patient not taking: Reported on 10/26/2016 10/15/16   Nita Sickle, MD  oxyCODONE (OXY IR/ROXICODONE) 5 MG immediate release tablet Take 1 tablet (5 mg total) by mouth every 6 (six) hours as needed for severe pain. 10/31/16   Sharyn Creamer, MD  pantoprazole (PROTONIX) 40 MG tablet Take 1 tablet (40 mg total) by mouth daily. 02/27/16   [name], MD  risperidone (RISPERDAL) 4 MG tablet Take 2-4 mg by mouth 2 (two) times daily. Pt takes 4 mg in the morning and 2 mg at bedtime.    Historical Provider, MD  sertraline (ZOLOFT) 50 MG tablet Take 50 mg by mouth daily.    Historical Provider, MD  sevelamer carbonate (RENVELA) 800 MG tablet Take 2,400 mg by mouth 2 (two) times daily.      Historical Provider, MD  topiramate (TOPAMAX) 200 MG tablet Take 200 mg by mouth 2 (two) times daily. Pt takes with a 50 mg tablet.    Historical Provider, MD  topiramate (TOPAMAX) 50 MG tablet Take 50 mg by mouth 2 (two) times daily. Pt takes with a 200 mg tablet.    Historical Provider, MD  traZODone (DESYREL) 100 MG tablet Take 100 mg by mouth at bedtime.    Historical Provider, MD  Vitamin D, Ergocalciferol, (DRISDOL) 50000 UNITS CAPS capsule Take 50,000 Units by mouth every 30 (thirty) days. Takes on the 8th of every month    Historical Provider, MD     Allergies Dhea [nutritional supplements]; Demerol [meperidine]; Floxin [ofloxacin]; Nsaids; Nubain [nalbuphine hcl]; Phenergan [promethazine hcl]; Stadol [butorphanol]; and Erythromycin   Family History  Problem Relation Age of Onset  . Heart attack Mother   . Migraines  Mother   . Aneurysm Mother   . Diabetes Father     Social History Social History  Substance Use Topics  . Smoking status: Current Every Day Smoker    Packs/day: 0.50    Years: 16.00    Types: Cigarettes  . Smokeless tobacco: Never Used  . Alcohol use 6.0 oz/week    10 Shots of liquor per week     Comment: per patient drinks liquor twice a week    Review of Systems  Constitutional:   No fever or chills.  ENT:   No sore throat. No rhinorrhea. Cardiovascular:   Positive as above chest pain. Respiratory:   Positive shortness of breath. Gastrointestinal:   Negative for abdominal pain, vomiting and diarrhea.  Genitourinary:  Patient still makes urine but has oliguria due to end-stage renal disease. Musculoskeletal:   Negative for focal pain or swelling Neurological:   Negative for headaches 10-point ROS otherwise negative.  ____________________________________________   PHYSICAL EXAM:  VITAL SIGNS: ED Triage Vitals  Enc Vitals Group     BP 11/08/16 0730 (!) 67/43     Pulse Rate 11/08/16 0730 72     Resp 11/08/16 0730 19     Temp 11/08/16 0730 97.8  F (36.6 C)     Temp Source 11/08/16 0730 Oral     SpO2 11/08/16 0730 95 %     Weight 11/08/16 0733 147 lb (66.7 kg)     Height 11/08/16 0733 4\' 10"  (1.473 m)     Head Circumference --      Peak Flow --      Pain Score 11/08/16 0734 10     Pain Loc --      Pain Edu? --      Excl. in GC? --     Vital signs reviewed, nursing assessments reviewed.   Constitutional:   Alert and oriented. Ill-appearing but mentating well. Eyes:   No scleral icterus. No conjunctival pallor. PERRL. EOMI.  No nystagmus. ENT   Head:   Normocephalic and atraumatic.   Nose:   No congestion/rhinnorhea. No septal hematoma   Mouth/Throat:   Dry mucous membranes, no pharyngeal erythema. No peritonsillar mass.    Neck:   No stridor. No SubQ emphysema. No meningismus. Peripheral IV inserted and right external jugular vein by EMS. This is positional, difficult to keep infusing continuously and shows some signs of infiltration Hematological/Lymphatic/Immunilogical:   No cervical lymphadenopathy. Cardiovascular:   RRR. Symmetric bilateral radial and DP pulses.  No murmurs.   Bedside ultrasonography performed as part of physical exam shows no free fluid in the abdomen, flat IVC, small pericardial effusion, enlarged right ventricular with bowing of the interventricular septum and hyperdynamic left ventricule. No pneumothorax or pleural effusion. No AAA.  Respiratory:   Normal respiratory effort without tachypnea nor retractions. Breath sounds are clear and equal bilaterally. No wheezes/rales/rhonchi. Gastrointestinal:   Soft with suprapubic tenderness. Non distended. There is no CVA tenderness.  No rebound, rigidity, or guarding. Genitourinary:   Unremarkable external exam. Incontinent of stool Musculoskeletal:   Nontender with normal range of motion in all extremities. No joint effusions.  No lower extremity tenderness.  No edema. Neurologic:   Normal speech and language.  CN 2-10 normal. Motor grossly  intact. No gross focal neurologic deficits are appreciated.  Skin:    Skin is warm, dry and intact. No rash noted.  No petechiae, purpura, or bullae.  ____________________________________________    LABS (pertinent positives/negatives) (all labs ordered are listed, but only  abnormal results are displayed) Labs Reviewed  COMPREHENSIVE METABOLIC PANEL - Abnormal; Notable for the following:       Result Value   Sodium 133 (*)    CO2 18 (*)    Glucose, Bld 107 (*)    BUN 54 (*)    Creatinine, Ser 8.67 (*)    Calcium 7.5 (*)    Total Protein 5.6 (*)    Albumin 2.9 (*)    ALT <5 (*)    GFR calc non Af Amer 5 (*)    GFR calc Af Amer 6 (*)    All other components within normal limits  CBC WITH DIFFERENTIAL/PLATELET - Abnormal; Notable for the following:    WBC 11.6 (*)    RBC 3.76 (*)    Hemoglobin 11.5 (*)    RDW 18.4 (*)    Neutro Abs 9.5 (*)    All other components within normal limits  URINALYSIS, ROUTINE W REFLEX MICROSCOPIC - Abnormal; Notable for the following:    Color, Urine YELLOW (*)    APPearance CLEAR (*)    Protein, ur 100 (*)    Bacteria, UA RARE (*)    Squamous Epithelial / LPF 6-30 (*)    All other components within normal limits  LACTIC ACID, PLASMA - Abnormal; Notable for the following:    Lactic Acid, Venous 2.4 (*)    All other components within normal limits  BRAIN NATRIURETIC PEPTIDE - Abnormal; Notable for the following:    B Natriuretic Peptide 393.0 (*)    All other components within normal limits  APTT - Abnormal; Notable for the following:    aPTT 48 (*)    All other components within normal limits  PROTIME-INR - Abnormal; Notable for the following:    Prothrombin Time 16.6 (*)    All other components within normal limits  CULTURE, BLOOD (ROUTINE X 2)  CULTURE, BLOOD (ROUTINE X 2)  URINE CULTURE  BODY FLUID CULTURE  GLUCOSE, CAPILLARY  TROPONIN I  LIPASE, BLOOD  PROCALCITONIN  LACTIC ACID, PLASMA  TYPE AND SCREEN    ____________________________________________   EKG  Interpreted by me Sinus rhythm rate of 74, right axis, normal intervals. Normal QRS ST segments and T waves. Repeat EKG shows no interval change within a 2 or 3 minutes span. EKG appears unchanged from previous.  ____________________________________________    RADIOLOGY  Chest x-ray unremarkable X-ray 1 view abdomen unremarkable. Right femoral catheter follows expected path  ____________________________________________   PROCEDURES Procedures CRITICAL CARE Performed by: Scotty Court, Conor Filsaime   Total critical care time: 35 minutes  Critical care time was exclusive of separately billable procedures and treating other patients.  Critical care was necessary to treat or prevent imminent or life-threatening deterioration.  Critical care was time spent personally by me on the following activities: development of treatment plan with patient and/or surrogate as well as nursing, discussions with consultants, evaluation of patient's response to treatment, examination of patient, obtaining history from patient or surrogate, ordering and performing treatments and interventions, ordering and review of laboratory studies, ordering and review of radiographic studies, pulse oximetry and re-evaluation of patient's condition.   CENTRAL LINE Performed by: Scotty Court, Sahasra Belue Consent: The procedure was performed in an emergent situation. Verbal consent obtained from patient who is awake and alert. Required items: required blood products, implants, devices, and special equipment available Patient identity confirmed: arm band and provided demographic data Time out: Immediately prior to procedure a "time out" was called to verify the correct patient, procedure, equipment,  support staff and site/side marked as required. Indications: vascular access Anesthesia: local infiltration Local anesthetic: lidocaine 1% without epinephrine Anesthetic total: 3  ml Patient sedated: no Preparation: skin prepped with 2% chlorhexidine Skin prep agent dried: skin prep agent completely dried prior to procedure Sterile barriers: all five maximum sterile barriers used - cap, mask, sterile gown, sterile gloves, and large sterile sheet Hand hygiene: hand hygiene performed prior to central venous catheter insertion  Location details: R femoral vein  Catheter type: triple lumen Catheter size: 8 Fr Pre-procedure: landmarks identified Ultrasound guidance: yes, continuous realtime Successful placement: yes Post-procedure: line sutured and dressing applied Assessment: blood return through all parts, free fluid flow, placement verified by x-ray and no pneumothorax on x-ray Patient tolerance: Patient tolerated the procedure well with no immediate complications.  ____________________________________________   INITIAL IMPRESSION / ASSESSMENT AND PLAN / ED COURSE  Pertinent labs & imaging results that were available during my care of the patient were reviewed by me and considered in my medical decision making (see chart for details).  Patient arrives to ED with pleuritic chest pain and severe hypotension. Had one peripheral IV line which was not reliable, and difficulty with obtaining further peripheral IV access due to end-stage renal disease and dialysis and multiple vascular surgeries in the past. In addition this is worsened by her intravascular depletion. Due to this, a central venous catheter was placed emergently for access. Vasopressor support was begun with dobutamine due to persistent hypotension after 1 L bolus. She had an immediate response to the dobutamine improving her blood pressure to inadequate perfusing level.   Clinical Course as of Nov 08 1150  Tue Nov 08, 2016  0811 Bp 88/49 (61) on dobutamine 5 mcg/kg/min. Increase to 7.5. Labs unremarkable. F/u imaging.   [PS]  1008 BP decreasing , CTA reveals large pericardial effusion c/w tamponade.   [PS]  1146 After bedside pericardiocentesis by Dr. Juliann Pares pt still hypotensive by automatic cuff pressure. [facility] surg eval and ongoing crit care. D/w Dr. Tyson Alias, accepts.   [PS]    Clinical Course User Index [PS] Sharman Cheek, MD    ----------------------------------------- 11:52 AM on 11/08/2016 -----------------------------------------  Started second pressor with levo fed given the ongoing hypotension. Although her initial presentation is not consistent with sepsis due to isolated hypotension, there is now enough of clinical uncertainty that it warrants empiric antibiotics  With vancomycin and Zosyn. We'll continue IV fluid boluses for pressure support until arrival at receiving Hospital. Dr. Tyrone Sage with CT surgery at [facility] is aware of the patient as well although I have not been able to speak with him directly due to his being unavailable in the OR.    Prior to Pericardiocentesis being performed at bedside in our ED, we did page Dr. Inez Catalina who potentially could perform a pericardial window at this hospital, I'm not sure if that would be within his comfort level for this patient. However, he does not appear to be available within a reasonable time frame as he did not respond to page. We'll proceed with transfer to expedite definitive care ____________________________________________   FINAL CLINICAL IMPRESSION(S) / ED DIAGNOSES  Final diagnoses:  Other specified hypotension  Pericardial effusion with cardiac tamponade      New Prescriptions   No medications on file     Portions of this note were generated with dragon dictation software. Dictation errors may occur despite best attempts at proofreading.    Sharman Cheek, MD 11/08/16  1155  

## 2016-11-08 NOTE — ED Notes (Signed)
Pt removed from bed pan. No BM.

## 2016-11-09 ENCOUNTER — Inpatient Hospital Stay (HOSPITAL_COMMUNITY): Payer: Medicare Other

## 2016-11-09 ENCOUNTER — Encounter (HOSPITAL_COMMUNITY): Payer: Self-pay | Admitting: Cardiovascular Disease

## 2016-11-09 DIAGNOSIS — I313 Pericardial effusion (noninflammatory): Secondary | ICD-10-CM

## 2016-11-09 DIAGNOSIS — R579 Shock, unspecified: Secondary | ICD-10-CM

## 2016-11-09 LAB — PH, BODY FLUID: pH, Body Fluid: 7.3

## 2016-11-09 LAB — RENAL FUNCTION PANEL
Albumin: 2.5 g/dL — ABNORMAL LOW (ref 3.5–5.0)
Anion gap: 9 (ref 5–15)
BUN: 44 mg/dL — AB (ref 6–20)
CO2: 17 mmol/L — ABNORMAL LOW (ref 22–32)
Calcium: 7.7 mg/dL — ABNORMAL LOW (ref 8.9–10.3)
Chloride: 106 mmol/L (ref 101–111)
Creatinine, Ser: 7.84 mg/dL — ABNORMAL HIGH (ref 0.44–1.00)
GFR calc Af Amer: 7 mL/min — ABNORMAL LOW (ref >60)
GFR calc non Af Amer: 6 mL/min — ABNORMAL LOW (ref >60)
GLUCOSE: 145 mg/dL — AB (ref 65–99)
POTASSIUM: 5.6 mmol/L — AB (ref 3.5–5.1)
Phosphorus: 4.5 mg/dL (ref 2.5–4.6)
Sodium: 132 mmol/L — ABNORMAL LOW (ref 135–145)

## 2016-11-09 LAB — MAGNESIUM: MAGNESIUM: 1.5 mg/dL — AB (ref 1.7–2.4)

## 2016-11-09 LAB — CBC
HCT: 32.2 % — ABNORMAL LOW (ref 36.0–46.0)
HEMOGLOBIN: 10.7 g/dL — AB (ref 12.0–15.0)
MCH: 30.8 pg (ref 26.0–34.0)
MCHC: 33.2 g/dL (ref 30.0–36.0)
MCV: 92.8 fL (ref 78.0–100.0)
PLATELETS: 162 10*3/uL (ref 150–400)
RBC: 3.47 MIL/uL — AB (ref 3.87–5.11)
RDW: 18.2 % — ABNORMAL HIGH (ref 11.5–15.5)
WBC: 25.7 10*3/uL — AB (ref 4.0–10.5)

## 2016-11-09 LAB — MISC LABCORP TEST (SEND OUT): Labcorp test code: 19588

## 2016-11-09 LAB — URINE CULTURE

## 2016-11-09 LAB — TYPE AND SCREEN
ABO/RH(D): A POS
Antibody Screen: NEGATIVE

## 2016-11-09 LAB — ECHOCARDIOGRAM LIMITED
HEIGHTINCHES: 58 in
Weight: 2366.86 oz

## 2016-11-09 LAB — ABO/RH: ABO/RH(D): A POS

## 2016-11-09 MED ORDER — PANTOPRAZOLE SODIUM 40 MG PO TBEC: 40.0000 mg | DELAYED_RELEASE_TABLET | Freq: Every day | ORAL | Status: DC

## 2016-11-09 MED ORDER — SERTRALINE HCL 50 MG PO TABS
50.0000 mg | ORAL_TABLET | Freq: Every day | ORAL | Status: DC
  Administered 2016-11-09 – 2016-11-14 (×6): 50 mg via ORAL
  Filled 2016-11-09 (×6): qty 1

## 2016-11-09 MED ORDER — SERTRALINE HCL 50 MG PO TABS: 50.0000 mg | ORAL_TABLET | Freq: Every day | ORAL | Status: DC

## 2016-11-09 MED ORDER — MELATONIN 5 MG PO TABS: 2.0000 | ORAL_TABLET | Freq: Every day | ORAL | Status: DC

## 2016-11-09 MED ORDER — LEVOTHYROXINE SODIUM 112 MCG PO TABS
112.0000 ug | ORAL_TABLET | Freq: Every day | ORAL | Status: DC
  Administered 2016-11-09 – 2016-11-14 (×6): 112 ug via ORAL
  Filled 2016-11-09 (×6): qty 1

## 2016-11-09 MED ORDER — SODIUM CHLORIDE 0.9% FLUSH
10.0000 mL | Freq: Two times a day (BID) | INTRAVENOUS | Status: DC
  Administered 2016-11-09: 20 mL
  Administered 2016-11-09 – 2016-11-11 (×4): 10 mL

## 2016-11-09 MED ORDER — HYDROCORTISONE 5 MG PO TABS
5.0000 mg | ORAL_TABLET | Freq: Every day | ORAL | Status: DC
  Administered 2016-11-09 – 2016-11-13 (×5): 5 mg via ORAL
  Filled 2016-11-09 (×5): qty 1

## 2016-11-09 MED ORDER — RISPERIDONE 2 MG PO TABS
2.0000 mg | ORAL_TABLET | Freq: Every day | ORAL | Status: DC
  Administered 2016-11-09 – 2016-11-13 (×5): 2 mg via ORAL
  Filled 2016-11-09 (×6): qty 1

## 2016-11-09 MED ORDER — TRAZODONE HCL 100 MG PO TABS
100.0000 mg | ORAL_TABLET | Freq: Every day | ORAL | Status: DC
  Administered 2016-11-09 – 2016-11-13 (×5): 100 mg via ORAL
  Filled 2016-11-09 (×2): qty 1
  Filled 2016-11-09: qty 2
  Filled 2016-11-09: qty 1
  Filled 2016-11-09: qty 2

## 2016-11-09 MED ORDER — GABAPENTIN 100 MG PO CAPS
350.0000 mg | ORAL_CAPSULE | Freq: Every day | ORAL | Status: DC
Start: 2016-11-09 — End: 2016-11-09

## 2016-11-09 MED ORDER — LORATADINE 10 MG PO TABS
10.0000 mg | ORAL_TABLET | Freq: Every day | ORAL | Status: DC
  Administered 2016-11-09 – 2016-11-14 (×6): 10 mg via ORAL
  Filled 2016-11-09 (×6): qty 1

## 2016-11-09 MED ORDER — FENOFIBRATE 160 MG PO TABS
160.0000 mg | ORAL_TABLET | Freq: Every day | ORAL | Status: DC
  Administered 2016-11-09 – 2016-11-14 (×6): 160 mg via ORAL
  Filled 2016-11-09 (×6): qty 1

## 2016-11-09 MED ORDER — PRAVASTATIN SODIUM 40 MG PO TABS: 20.0000 mg | ORAL_TABLET | Freq: Every day | ORAL | Status: DC

## 2016-11-09 MED ORDER — OXYCODONE HCL 5 MG PO TABS
5.0000 mg | ORAL_TABLET | Freq: Four times a day (QID) | ORAL | Status: DC | PRN
  Administered 2016-11-09 – 2016-11-14 (×15): 5 mg via ORAL
  Filled 2016-11-09 (×14): qty 1

## 2016-11-09 MED ORDER — PANTOPRAZOLE SODIUM 40 MG PO TBEC
40.0000 mg | DELAYED_RELEASE_TABLET | Freq: Every day | ORAL | Status: DC
  Administered 2016-11-09 – 2016-11-14 (×6): 40 mg via ORAL
  Filled 2016-11-09 (×6): qty 1

## 2016-11-09 MED ORDER — TOPIRAMATE 25 MG PO TABS
250.0000 mg | ORAL_TABLET | Freq: Two times a day (BID) | ORAL | Status: DC
  Administered 2016-11-09 – 2016-11-14 (×11): 250 mg via ORAL
  Filled 2016-11-09 (×2): qty 2
  Filled 2016-11-09 (×6): qty 10
  Filled 2016-11-09 (×4): qty 2

## 2016-11-09 MED ORDER — RISPERIDONE 2 MG PO TABS: 2.0000 mg | ORAL_TABLET | Freq: Two times a day (BID) | ORAL | Status: DC

## 2016-11-09 MED ORDER — SODIUM CHLORIDE 0.9% FLUSH
10.0000 mL | INTRAVENOUS | Status: DC | PRN
  Administered 2016-11-12: 10 mL
  Administered 2016-11-13 – 2016-11-14 (×2): 30 mL
  Filled 2016-11-09 (×3): qty 40

## 2016-11-09 MED ORDER — HYDROCORTISONE 10 MG PO TABS
10.0000 mg | ORAL_TABLET | Freq: Every day | ORAL | Status: DC
  Administered 2016-11-10 – 2016-11-14 (×5): 10 mg via ORAL
  Filled 2016-11-09 (×6): qty 1

## 2016-11-09 MED ORDER — RISPERIDONE 2 MG PO TABS
4.0000 mg | ORAL_TABLET | Freq: Every day | ORAL | Status: DC
  Administered 2016-11-10 – 2016-11-14 (×5): 4 mg via ORAL
  Filled 2016-11-09 (×5): qty 2

## 2016-11-09 NOTE — Progress Notes (Signed)
PULMONARY / CRITICAL CARE MEDICINE   Name: Erin [name] MRN: [id number] DOB: 07/30/1971    ADMISSION DATE:  11/08/2016 CONSULTATION DATE:  11/08/16  REFERRING MD:  [facility] / Dr. Welton Flakes  CHIEF COMPLAINT:  Chest Pain  HISTORY OF PRESENT ILLNESS:  46 y/o F, smoker, with a h/o CVA, DVT (not on anticoagulation), kidney failure (on dialysis T,Th,S), anxiety, asthma, epilepsy, blindness (since childhood), CP with a recent cardiac catheterization (10/28/16) which was unremarkable for coronary disease and recent ER visit at [facility] on 1/7 for chest pain.  No acute findings were noted and she was discharged home.  She continued to have chest pain at home.    She returned to the ER again on 1/9 for evaluation. The patient reported ongoing chest pain that she describes as pressure and shortness of breath.  She was found to be hypotensive in the ER with pressures in the 60's.  Chest CT was evaluated and was negative for PE but showed a moderate size pericardial effusion, and left pleural effusion.  CXR on 1/9 showed bilateral pulmonary interstitial edema and cardiomegaly.  ECHO was performed which confirmed pericardial effusion with concern for RV collapse.  Per chart review, Dr. Welton Flakes performed a bedside pericardiocentesis which removed 80 cc straw colored fluid without a change in BP but with decreased RV collapse. She was subsequently transferred to [facility] for further evaluation.  SUBJECTIVE: Pt continues to report chest pain. Reports that it is improved. Denies dyspnea.   VITAL SIGNS: BP 101/76   Pulse 71   Temp 98.1 F (36.7 C) (Oral)   Resp 14   Ht 4\' 10"  (1.473 m)   Wt 147 lb 14.9 oz (67.1 kg)   LMP 11/21/2007 (Approximate) Comment: No period since 2009,due to"DVT" per pt since they stopped BCP...no periods  SpO2 93%   BMI 30.92 kg/m   INTAKE / OUTPUT: I/O last 3 completed shifts: In: 716.1 [P.O.:240; I.V.:476.1] Out: 781 [Urine:150; Drains:630; Stool:1]  PHYSICAL EXAMINATION: General:   Chronically ill appearing female in NAD  Neuro:  Awake, alert, oriented, speech clear, MAE HEENT:  MM pink/moist, positive jvd with hepatojugular reflex Cardiovascular:  s1s2 regular, muffled tones Lungs:  Even/non-labored, lungs bilaterally clear Abdomen:  soft, nontender, nondistended  Musculoskeletal:  No acute deformities  Skin:  Warm/dry, no edema, multiple old graft sites, RUE AVF   LABS:  BMET  Recent Labs Lab 11/06/16 0635 11/08/16 0733 11/08/16 1823 11/09/16 0410  NA 134* 133*  --  132*  K 5.6* 4.2  --  5.6*  CL 100* 102  --  106  CO2 22 18*  --  17*  BUN 46* 54*  --  44*  CREATININE 6.50* 8.67*  --  7.84*  GLUCOSE 53* 107* 123* 145*    Electrolytes  Recent Labs Lab 11/06/16 0635 11/08/16 0733 11/08/16 1823 11/09/16 0410  CALCIUM 8.7* 7.5*  --  7.7*  MG  --   --   --  1.5*  PHOS  --   --  5.1* 4.5    CBC  Recent Labs Lab 11/08/16 0733 11/08/16 1823 11/09/16 0410  WBC 11.6* 22.2* 25.7*  HGB 11.5* 10.6* 10.7*  HCT 35.5 32.5* 32.2*  PLT 174 152 162    Coag's  Recent Labs Lab 11/08/16 0733  APTT 48*  INR 1.33    Sepsis Markers  Recent Labs Lab 11/08/16 0733  LATICACIDVEN 2.4*  PROCALCITON 0.67   Liver Enzymes  Recent Labs Lab 11/06/16 0635 11/08/16 0733 11/09/16 0410  AST  32 27  --   ALT <5* <5*  --   ALKPHOS 137* 125  --   BILITOT 0.6 0.5  --   ALBUMIN 3.9 2.9* 2.5*    Cardiac Enzymes  Recent Labs Lab 11/06/16 0635 11/08/16 0733  TROPONINI <0.03 <0.03    Glucose  Recent Labs Lab 11/08/16 0738  GLUCAP 94    Imaging Dg Chest Portable 1 View  Result Date: 11/08/2016 CLINICAL DATA:  Pericardiocentesis. EXAM: PORTABLE CHEST 1 VIEW COMPARISON:  CT 11/08/2016.  Chest x-ray 11/08/2016. FINDINGS: Cardiomegaly with bilateral pulmonary interstitial prominence consistent with congestive heart failure. Small left pleural effusion. No pneumothorax. IMPRESSION: Congestive heart failure with bilateral pulmonary interstitial  edema. Degree of cardiomegaly is unchanged from prior exam . Electronically Signed   By: Maisie Fus  Register   On: 11/08/2016 12:13    STUDIES:  CTA chest 01/09 > mod to large pericardial effusion, no PE, small b/l pleural effusions, mild right supraclavicular adenopathy. Echo 01/09 > LV EF 50% to 55%, moderate tamponade physiology. A moderate to large residual pericardial effusion Echo 1/10 >   CULTURES: Pericardial fluid 1/9 >> gram stain neg  ANTIBIOTICS: None.  SIGNIFICANT EVENTS: 01/09 > admit. 1/9 > pericardiocentesis with Dr. Excell Seltzer. fluid removed  LINES/TUBES: R fem CVL 01/09 > Pericardial drain 1/9>  DISCUSSION: 46 y.o. female admitted with large pericardial effusion with concern for tamponade.  Was seen at [facility], had 80cc drained via pericardiocentesis then transferred to [facility] for further management.  ASSESSMENT / PLAN:  CARDIOVASCULAR A:  Obstructive shock secondary cardiac tamponade. Pericardial effusion with tamponade - s/p bedside pericardiocentesis 01/09 with 80cc straw colored fluid drained. Effusion presumed due to ESRD. Hx HLD. P:  Continue levophed and wean as tolerated for MAP goal of 65. She is off of dobutamine now Repeat echo pending. Continue preadmission fenofibrate, lovastatin.  RENAL A:   ESRD (T/Th/Sat - last dialyzed 01/06). Hyponatremia - presumed hypervolemic. P:   Fluids at [facility]. Nephrology following. Intermittent HD BMP in AM.  PULMONARY A: Hx Asthma. P:   BD's.  GASTROINTESTINAL A:   Nutrition. P:   NPO.  HEMATOLOGIC A:   VTE Prophylaxis. Hx DVT. P:  SCD's only for now. CBC in AM.  INFECTIOUS A:   No indication of infection. P:   No interventions required. Follow fluid analysis.  ENDOCRINE A:   Hx panhypopituitarism, hypothyroidism.  P:   Stress steroids. Continue preadmission synthroid, change to IV formulation. Holding home florinef.  NEUROLOGIC A:   Hx psychosis / bipolar affect,  narcolepsy, CVA, fibromyalgia, epilepsy, depression, chronic headaches, legally blind, anxiety. P:   Restart home gabapentin, methylphenidate, oxy IR, risperidone, sertraline, topiramate, trazodone, melatonin.  Family updated: Pt's brother updated by Dr. Excell Seltzer regarding plan of care 1/9  Interdisciplinary Family Meeting v Palliative Care Meeting:  Due by: 11/14/16.  Griffin Basil, MD  Internal Medicine PGY-3 Pager: [phone]  or [phone] 11/09/2016, 11:25 AM

## 2016-11-09 NOTE — Progress Notes (Signed)
[employer] who work thru [employer] [phone] assists w dc needs. Pt is indep and makes own decisions. Will cont to follow.

## 2016-11-09 NOTE — Progress Notes (Signed)
  Echocardiogram 2D Echocardiogram has been performed.  Arvil Chaco 11/09/2016, 12:25 PM

## 2016-11-09 NOTE — Progress Notes (Signed)
Patient ID: Erin [name], female   DOB: 1971/10/17, 46 y.o.   MRN: [id number]  [facility] Progress Note   Assessment/ Plan:   1. Pericardial effusion/tamponade: Improved hemodynamic parameters status post pericardiocentesis- Dobutamine weaned off but remains on levophed at this time. She refused dialysis yesterday evening because it was "too late"-getting hemodialysis at this time with attempts at ultrafiltration. 2. End-stage renal disease: Usually on a Tuesday/Thursday/Saturday schedule- getting dialysis this morning after she refused it last night because of the delay in dialysis delivery while we were awaiting weaning off pressors. Next hemodialysis due tomorrow, we'll be judicious with ultrafiltration. 3. Hyponatremia: Likely secondary to current volume status/free water intake between dialysis treatments. Monitor with hemodialysis. 4. Hypoalbuminemia: Discussed optimal compliance with hemodialysis and continued nutritional supplementation. 5. Anemia of chronic kidney disease: Hemoglobin appears to be within acceptable limits-recently 11.5. We'll monitor closely status post pericardiocentesis. Not on ESA at this time. 6. Metabolic bone disease:  phosphorus levels are acceptable, continue to monitor  Subjective:   Some breakthrough pain overnight-addressed by critical care. Denies any shortness of breath this morning   Objective:   BP 91/60   Pulse 79   Temp 98.1 F (36.7 C) (Oral)   Resp (!) 26   Ht 4\' 10"  (1.473 m)   Wt 67.1 kg (147 lb 14.9 oz)   LMP 11/21/2007 (Approximate) Comment: No period since 2009,due to"DVT" per pt since they stopped BCP...no periods  SpO2 96%   BMI 30.92 kg/m   Physical Exam: HAL:[id number] resting in bed, getting hemodialysis  ZHG:DJMEQ regular rhythm, normal rate, S1 and S2 normal  Resp:Clear to auscultation, no rales  AST:MHDQ, obese, nontender  QIW:LNLGX ankle edema  Labs: BMET  Recent Labs Lab 11/06/16 0635 11/08/16 0733  11/08/16 1823 11/09/16 0410  NA 134* 133*  --  132*  K 5.6* 4.2  --  5.6*  CL 100* 102  --  106  CO2 22 18*  --  17*  GLUCOSE 53* 107* 123* 145*  BUN 46* 54*  --  44*  CREATININE 6.50* 8.67*  --  7.84*  CALCIUM 8.7* 7.5*  --  7.7*  PHOS  --   --  5.1* 4.5   CBC  Recent Labs Lab 11/06/16 0635 11/08/16 0733 11/08/16 1823 11/09/16 0410  WBC 6.0 11.6* 22.2* 25.7*  NEUTROABS 3.0 9.5* 21.6*  --   HGB 12.5 11.5* 10.6* 10.7*  HCT 37.8 35.5 32.5* 32.2*  MCV 93.5 94.6 93.4 92.8  PLT 166 174 152 162   Medications:    . fenofibrate  160 mg Oral Daily  . gabapentin  400 mg Oral QHS  . hydrocortisone sod succinate (SOLU-CORTEF) inj  50 mg Intravenous Q6H  . levothyroxine  66 mcg Intravenous Daily  . mouth rinse  15 mL Mouth Rinse BID  . pravastatin  40 mg Oral q1800  . sevelamer carbonate  2,400 mg Oral BID WC  . sodium chloride flush  10-40 mL Intracatheter Q12H   Zetta Bills, MD 11/09/2016, 8:58 AM

## 2016-11-09 NOTE — Progress Notes (Signed)
Patient Name: Erin [name] Date of Encounter: 11/09/2016  Primary Cardiologist: Dr Welton Flakes (Dr Excell Seltzer saw here)  Hospital Problem List     Active Problems:   Cardiogenic shock [facility])   Cardiac/pericardial tamponade     Subjective   C/o upper L chest pain, worse with deep inspiration. Requests Dilaudid for this. Says pain is severe. Had HD this am  Inpatient Medications    Scheduled Meds: . fenofibrate  160 mg Oral Daily  . gabapentin  400 mg Oral QHS  . hydrocortisone sod succinate (SOLU-CORTEF) inj  50 mg Intravenous Q6H  . levothyroxine  112 mcg Oral QAC breakfast  . mouth rinse  15 mL Mouth Rinse BID  . pravastatin  40 mg Oral q1800  . sevelamer carbonate  2,400 mg Oral BID WC  . sodium chloride flush  10-40 mL Intracatheter Q12H   Continuous Infusions: . sodium chloride 10 mL/hr at 11/08/16 1350  . DOBUTamine Stopped (11/08/16 1630)  . norepinephrine (LEVOPHED) Adult infusion 8 mcg/min (11/09/16 0915)   PRN Meds: sodium chloride, sodium chloride, sodium chloride, acetaminophen, albuterol, alteplase, heparin, lidocaine (PF), lidocaine-prilocaine, oxyCODONE, pentafluoroprop-tetrafluoroeth, sodium chloride flush   Vital Signs    Vitals:   11/09/16 1015 11/09/16 1030 11/09/16 1045 11/09/16 1100  BP: 99/69 90/65 102/72 101/76  Pulse: 76 73 76 71  Resp: 15 15 17 14   Temp:      TempSrc:      SpO2:      Weight:      Height:        Intake/Output Summary (Last 24 hours) at 11/09/16 1120 Last data filed at 11/09/16 0800  Gross per 24 hour  Intake           723.59 ml  Output              781 ml  Net           -57.41 ml   Filed Weights   11/09/16 0300 11/09/16 0745  Weight: 151 lb 7.3 oz (68.7 kg) 147 lb 14.9 oz (67.1 kg)    Physical Exam    GEN: Well nourished, well developed, in no acute distress.  HEENT: Grossly normal.  Neck: Supple, no JVD seen but difficult to assess, carotid bruits, or masses. Cardiac: RRR, 2/6 murmurs, no rubs, no gallops. No  clubbing, cyanosis, edema.  Radials/DP/PT 1-2+ and equal bilaterally. HD graft in RUE Respiratory:  Respirations regular and unlabored, clear to auscultation anteriorly. GI: Soft, nontender, nondistended, BS + x 4. MS: no deformity or atrophy. Skin: warm and dry, no rash. Neuro:  Strength and sensation are intact. Psych: AAOx3.  Normal affect.  Labs    CBC  Recent Labs  11/08/16 0733 11/08/16 1823 11/09/16 0410  WBC 11.6* 22.2* 25.7*  NEUTROABS 9.5* 21.6*  --   HGB 11.5* 10.6* 10.7*  HCT 35.5 32.5* 32.2*  MCV 94.6 93.4 92.8  PLT 174 152 162   Basic Metabolic Panel  Recent Labs  11/08/16 0733 11/08/16 1823 11/09/16 0410  NA 133*  --  132*  K 4.2  --  5.6*  CL 102  --  106  CO2 18*  --  17*  GLUCOSE 107* 123* 145*  BUN 54*  --  44*  CREATININE 8.67*  --  7.84*  CALCIUM 7.5*  --  7.7*  MG  --   --  1.5*  PHOS  --  5.1* 4.5   Liver Function Tests  Recent Labs  11/08/16 0733 11/09/16 0410  AST 27  --   ALT <5*  --   ALKPHOS 125  --   BILITOT 0.5  --   PROT 5.6*  --   ALBUMIN 2.9* 2.5*    Recent Labs  11/08/16 0733  LIPASE 27   Cardiac Enzymes  Recent Labs  11/08/16 0733  TROPONINI <0.03    Telemetry    SR, ST - Personally Reviewed  ECG    N/a - Personally Reviewed  Radiology    Dg Abdomen 1 View Result Date: 11/08/2016 CLINICAL DATA:  Femoral line placement, hypotension, chest pain, dialysis EXAM: ABDOMEN - 1 VIEW COMPARISON:  Lumbar spine films of 10/26/2016 and CT abdomen pelvis of 07/04/2016 FINDINGS: Supine views of the abdomen show large and small bowel gas to be present with no significant distension. Linear scarring, atelectasis, or pneumonia is noted at lung bases left-greater-than-right. Surgical clips are present in the right upper quadrant from prior cholecystectomy. The right femoral venous line tip extends to the L4 level. IMPRESSION: 1. Right femoral venous catheter tip extends to L4. 2. No bowel obstruction. Electronically  Signed   By: Dwyane Dee M.D.   On: 11/08/2016 08:31   Ct Angio Chest Pe W And/or Wo Contrast Result Date: 11/08/2016 CLINICAL DATA:  Chest pain. Pain for 2 days following dialysis. Hypotensive. EXAM: CT ANGIOGRAPHY CHEST WITH CONTRAST TECHNIQUE: Multidetector CT imaging of the chest was performed using the standard protocol during bolus administration of intravenous contrast. Multiplanar CT image reconstructions and MIPs were obtained to evaluate the vascular anatomy. CONTRAST:  75 mL Isovue COMPARISON:  CT 10/31/2016 FINDINGS: Cardiovascular: No filling defects within pulmonary artery to suggest acute pulmonary embolism. No acute findings aorta great vessels. Moderate size pleural effusion is present measuring 2 cm in thickness along the LEFT ventricle. Mediastinum/Nodes: No axillary adenopathy. LEFT supraclavicular node measures 11 mm (image 11, series 6). RIGHT lower paratracheal lymph node measures 9 mm. Prevascular lymph node measures 12 mm. Esophagus normal. Lungs/Pleura: There is para bronchovascular thickening in the inferior LEFT lower lobe and mild consolidation. Small bilateral effusions. Pneumothorax. No pulmonary mass identified. Upper Abdomen: Limited view of the liver, kidneys, pancreas are unremarkable. Normal adrenal glands. Musculoskeletal: No aggressive osseous lesion. Severe compression fracture of the T6 vertebral body unchanged. Review of the MIP images confirms the above findings. IMPRESSION: 1. Moderate to large pericardial effusion is new from 10/31/2016 2. No evidence of pulmonary embolism. 3. New bilateral small effusions and LEFT basilar atelectasis versus infiltrate or pneumonitis. 4. Mild mediastinal and RIGHT supraclavicular adenopathy. Favor reactive adenopathy. Electronically Signed   By: Genevive Bi M.D.   On: 11/08/2016 09:34   Dg Chest Portable 1 View Result Date: 11/08/2016 CLINICAL DATA:  Pericardiocentesis. EXAM: PORTABLE CHEST 1 VIEW COMPARISON:  CT 11/08/2016.  Chest  x-ray 11/08/2016. FINDINGS: Cardiomegaly with bilateral pulmonary interstitial prominence consistent with congestive heart failure. Small left pleural effusion. No pneumothorax. IMPRESSION: Congestive heart failure with bilateral pulmonary interstitial edema. Degree of cardiomegaly is unchanged from prior exam . Electronically Signed   By: Maisie Fus  Register   On: 11/08/2016 12:13   Dg Chest Portable 1 View Result Date: 11/08/2016 CLINICAL DATA:  Chest pain. EXAM: PORTABLE CHEST 1 VIEW COMPARISON:  Radiographs of October 31, 2016. FINDINGS: Stable cardiomediastinal silhouette. Both lungs are clear. No pneumothorax or pleural effusion is noted. The visualized skeletal structures are unremarkable. IMPRESSION: No acute cardiopulmonary abnormality seen. Electronically Signed   By: Lupita Raider, M.D.   On: 11/08/2016 08:32    Cardiac  Studies   01/09 ECHO:  Study Conclusions - Left ventricle: The cavity size was normal. Systolic function was   normal. The estimated ejection fraction was 55%. Wall motion was   normal; there were no regional wall motion abnormalities. - Aortic valve: Trileaflet; normal thickness, mildly calcified leaflets. - Right ventricle: The cavity size was below normal. Wall thickness   was normal. - Pericardium, extracardiac: There is a moderate to large   pericardial effusion measuring 2.57cm at largest diameter. The RV   is poorly visualized and appears underfilled. Difficult to assess   for RV collapse. There is minimal RA inversion. The mitral valve   inflow velocity varies by 20 cm/sec during respirations. The IVC   was not visualized.  Pericardiocentesis  Pericardium Pericardiocentesis performed by the subxiphoid approach. 550 mL of bloody fluid was removed from the pericardial cavity.     Limited f/u echo: Study Conclusions - Pericardium, extracardiac: There was a large circumferential   pericardial drainage. Patient underwent drainage of effusion and   now  appears to be a mild residual circumferential effusion.  Patient Profile     46 yo female w/ hx Bipolar, ESRD on HD, Sz, CVA, Hypothyroid, blindness, chronic pain issues, was admitted 01/09 from [facility] for pericardial effusion w/ tamponade. S/p pericardial drain.  Assessment & Plan    Active Problems: 1.  Cardiogenic shock (HCC) - BP improved, per CCM  2.  Cardiac/pericardial tamponade - drainage has slowed down overnight - MD to review and advise timing of removing the drain. Only 30 cc out overnight.  3. Chest pain:  - atypical and with deep inspiration - no rub on exam - MD advise on rx  Otherwise, per CCM  Signed, Barrett, Rhonda, PA-C  11/09/2016, 11:20 AM   History and all data above reviewed.  Patient examined.  I agree with the findings as above. Patient with chest pain to palpation right upper chest.  Otherwise no acute complaints.  No SOB.  Tolerated all but the last 13 minutes of dialysis.  No SOB.   The patient exam reveals COR:RRR, no rub  ,  Lungs: Clear  ,  Abd: Positive bowel sounds, no rebound no guarding, Ext No edema  .  All available labs, radiology testing, previous records reviewed. Agree with documented assessment and plan. Pericardial effusion/tamponade.  Drainage from catheter is down but still with some drainage this shift.  I will leave the drain in until tomorrow.   Panhypopit:  Change to PO steroids.     Fayrene Fearing Willian Donson  1:38 PM  11/09/2016

## 2016-11-09 NOTE — Progress Notes (Signed)
While pt was drying herself off after her bed bath, pt rugged a skin tear into her lower right abdomen.  Pt refused foam dressing.  Charted under skin assessment.  Will continue to monitor closely and update as needed.

## 2016-11-09 NOTE — Progress Notes (Signed)
Pt has not had any output since foley catheter was removed this AM.  Bladder scan done showing 150cc urine but pt denies having to urinate.  Will continue to monitor closely and update as needed.  Pt on HD.  Had HD session today.

## 2016-11-09 NOTE — Progress Notes (Signed)
eLink Physician-Brief Progress Note Patient Name: Erin [name] DOB: [birth year] MRN: [id number]   Date of Service  11/09/2016  HPI/Events of Note  Contacted by bedside nurse regarding ongoing pain. Patient admitted with pericardial effusion and shock due to tamponade. Now status post pericardial drain placement. Currently on vasopressors. Uses immediate release oxycodone at home. Refusing Tylenol. Nurse reports patient is taking oral medications.   eICU Interventions  1. Reordering home oxycodone immediate release every 6 hours when necessary for severe pain 2. Continue titration of vasopressor infusion      Intervention Category Intermediate Interventions: Pain - evaluation and management  Lawanda Cousins 11/09/2016, 2:31 AM

## 2016-11-09 NOTE — Procedures (Signed)
Patient seen on Hemodialysis. QB 400, UF goal 3.5L Treatment adjusted as needed.  Sadhana Frater MD [facility]. Office # [phone] Pager # [phone] 9:03 AM  

## 2016-11-10 DIAGNOSIS — Z881 Allergy status to other antibiotic agents status: Secondary | ICD-10-CM

## 2016-11-10 DIAGNOSIS — Z888 Allergy status to other drugs, medicaments and biological substances status: Secondary | ICD-10-CM

## 2016-11-10 DIAGNOSIS — Z820 Family history of epilepsy and other diseases of the nervous system: Secondary | ICD-10-CM

## 2016-11-10 DIAGNOSIS — Z886 Allergy status to analgesic agent status: Secondary | ICD-10-CM

## 2016-11-10 DIAGNOSIS — E039 Hypothyroidism, unspecified: Secondary | ICD-10-CM

## 2016-11-10 DIAGNOSIS — Z8249 Family history of ischemic heart disease and other diseases of the circulatory system: Secondary | ICD-10-CM

## 2016-11-10 DIAGNOSIS — F17210 Nicotine dependence, cigarettes, uncomplicated: Secondary | ICD-10-CM

## 2016-11-10 DIAGNOSIS — H548 Legal blindness, as defined in USA: Secondary | ICD-10-CM

## 2016-11-10 DIAGNOSIS — I309 Acute pericarditis, unspecified: Secondary | ICD-10-CM

## 2016-11-10 DIAGNOSIS — Z833 Family history of diabetes mellitus: Secondary | ICD-10-CM

## 2016-11-10 DIAGNOSIS — Z885 Allergy status to narcotic agent status: Secondary | ICD-10-CM

## 2016-11-10 DIAGNOSIS — Z9889 Other specified postprocedural states: Secondary | ICD-10-CM

## 2016-11-10 LAB — CBC
HCT: 27.7 % — ABNORMAL LOW (ref 36.0–46.0)
HEMOGLOBIN: 9.3 g/dL — AB (ref 12.0–15.0)
MCH: 30.5 pg (ref 26.0–34.0)
MCHC: 33.6 g/dL (ref 30.0–36.0)
MCV: 90.8 fL (ref 78.0–100.0)
PLATELETS: 169 10*3/uL (ref 150–400)
RBC: 3.05 MIL/uL — AB (ref 3.87–5.11)
RDW: 18.2 % — ABNORMAL HIGH (ref 11.5–15.5)
WBC: 25.1 10*3/uL — ABNORMAL HIGH (ref 4.0–10.5)

## 2016-11-10 LAB — RENAL FUNCTION PANEL
Albumin: 2.4 g/dL — ABNORMAL LOW (ref 3.5–5.0)
Anion gap: 11 (ref 5–15)
BUN: 23 mg/dL — AB (ref 6–20)
CALCIUM: 7.6 mg/dL — AB (ref 8.9–10.3)
CO2: 23 mmol/L (ref 22–32)
CREATININE: 5 mg/dL — AB (ref 0.44–1.00)
Chloride: 97 mmol/L — ABNORMAL LOW (ref 101–111)
GFR calc Af Amer: 11 mL/min — ABNORMAL LOW (ref >60)
GFR calc non Af Amer: 10 mL/min — ABNORMAL LOW (ref >60)
GLUCOSE: 104 mg/dL — AB (ref 65–99)
Phosphorus: 5.3 mg/dL — ABNORMAL HIGH (ref 2.5–4.6)
Potassium: 4.1 mmol/L (ref 3.5–5.1)
SODIUM: 131 mmol/L — AB (ref 135–145)

## 2016-11-10 LAB — TSH: TSH: 0.364 u[IU]/mL (ref 0.350–4.500)

## 2016-11-10 MED ORDER — MUPIROCIN 2 % EX OINT
1.0000 "application " | TOPICAL_OINTMENT | Freq: Two times a day (BID) | CUTANEOUS | Status: DC
  Administered 2016-11-10 – 2016-11-13 (×6): 1 via NASAL
  Filled 2016-11-10 (×2): qty 22

## 2016-11-10 MED ORDER — ONDANSETRON HCL 4 MG/2ML IJ SOLN
4.0000 mg | Freq: Four times a day (QID) | INTRAMUSCULAR | Status: DC | PRN
  Administered 2016-11-10 – 2016-11-12 (×2): 4 mg via INTRAVENOUS
  Filled 2016-11-10 (×2): qty 2

## 2016-11-10 MED ORDER — CHLORHEXIDINE GLUCONATE CLOTH 2 % EX PADS
6.0000 | MEDICATED_PAD | Freq: Every day | CUTANEOUS | Status: DC
  Administered 2016-11-10 – 2016-11-13 (×4): 6 via TOPICAL

## 2016-11-10 MED ORDER — NICOTINE 21 MG/24HR TD PT24
21.0000 mg | MEDICATED_PATCH | Freq: Every day | TRANSDERMAL | Status: DC
  Administered 2016-11-10 – 2016-11-14 (×5): 21 mg via TRANSDERMAL
  Filled 2016-11-10 (×5): qty 1

## 2016-11-10 NOTE — Progress Notes (Signed)
Dialysis treatment completed.  2500 mL ultrafiltrated and net fluid removal 2000 mL.    Patient status unchanged. Lung sounds diminished to ausculation in all fields. Generalized edema. Cardiac: NSR.  Disconnected lines and removed needles.  Pressure held for 10 minutes and band aid/gauze dressing applied.  Report given to bedside RN, Dahlia Client.

## 2016-11-10 NOTE — Progress Notes (Signed)
Patient Name: Erin [name] Date of Encounter: 11/10/2016  Primary Cardiologist: Dr. Fausto Skillern Problem List     Principal Problem:   Shock circulatory [facility]) Active Problems:   Cardiac/pericardial tamponade     Subjective   Chest pain and anxious.  Breathing OK.    Inpatient Medications    Scheduled Meds: . fenofibrate  160 mg Oral Daily  . gabapentin  400 mg Oral QHS  . hydrocortisone  10 mg Oral Daily  . hydrocortisone  5 mg Oral QHS  . levothyroxine  112 mcg Oral QAC breakfast  . loratadine  10 mg Oral Daily  . mouth rinse  15 mL Mouth Rinse BID  . pantoprazole  40 mg Oral Daily  . pravastatin  40 mg Oral q1800  . risperiDONE  2 mg Oral QHS  . risperiDONE  4 mg Oral Daily  . sertraline  50 mg Oral Daily  . sevelamer carbonate  2,400 mg Oral BID WC  . sodium chloride flush  10-40 mL Intracatheter Q12H  . topiramate  250 mg Oral BID  . traZODone  100 mg Oral QHS   Continuous Infusions: . sodium chloride 10 mL/hr at 11/08/16 1350  . DOBUTamine Stopped (11/08/16 1630)  . norepinephrine (LEVOPHED) Adult infusion 10 mcg/min (11/10/16 0830)   PRN Meds: sodium chloride, sodium chloride, sodium chloride, acetaminophen, albuterol, alteplase, heparin, lidocaine (PF), lidocaine-prilocaine, oxyCODONE, pentafluoroprop-tetrafluoroeth, sodium chloride flush   Vital Signs    Vitals:   11/10/16 0600 11/10/16 0630 11/10/16 0700 11/10/16 0800  BP: (!) 82/54 (!) 84/56 (!) 84/52 95/62  Pulse: (!) 59 (!) 56 61 (!) 55  Resp: (!) 9 (!) 8 (!) 7 (!) 8  Temp:    97.7 F (36.5 C)  TempSrc:    Oral  SpO2: 95% 94% 93% 94%  Weight:      Height:        Intake/Output Summary (Last 24 hours) at 11/10/16 0856 Last data filed at 11/10/16 0830  Gross per 24 hour  Intake           885.12 ml  Output             2975 ml  Net         -2089.88 ml   Filed Weights   11/09/16 0745 11/09/16 1117 11/10/16 0300  Weight: 147 lb 14.9 oz (67.1 kg) 142 lb 3.2 oz (64.5 kg) 148 lb 2.4  oz (67.2 kg)    Physical Exam    GEN: NAD.  Neck:  No JVD Cardiac:  Regular Rate and Rhythm, no murmurs, rubs, or gallops.  No edema.  Radials/DP/PT 2+  and equal bilaterally.  Respiratory:  Respirations  regular and unlabored, clear to auscultation bilaterally. GI: Soft, nontender, nondistended, BS + x 4. Skin: warm and dry, no rash. Neuro:   Strength and sensation are intact, blind Psych:  AAOx3.  Normal affect.  Labs    CBC  Recent Labs  11/08/16 0733 11/08/16 1823 11/09/16 0410 11/10/16 0458  WBC 11.6* 22.2* 25.7* 25.1*  NEUTROABS 9.5* 21.6*  --   --   HGB 11.5* 10.6* 10.7* 9.3*  HCT 35.5 32.5* 32.2* 27.7*  MCV 94.6 93.4 92.8 90.8  PLT 174 152 162 169   Basic Metabolic Panel  Recent Labs  11/09/16 0410 11/10/16 0458  NA 132* 131*  K 5.6* 4.1  CL 106 97*  CO2 17* 23  GLUCOSE 145* 104*  BUN 44* 23*  CREATININE 7.84* 5.00*  CALCIUM 7.7* 7.6*  MG 1.5*  --   PHOS 4.5 5.3*   Liver Function Tests  Recent Labs  11/08/16 0733 11/09/16 0410 11/10/16 0458  AST 27  --   --   ALT <5*  --   --   ALKPHOS 125  --   --   BILITOT 0.5  --   --   PROT 5.6*  --   --   ALBUMIN 2.9* 2.5* 2.4*    Recent Labs  11/08/16 0733  LIPASE 27   Cardiac Enzymes  Recent Labs  11/08/16 0733  TROPONINI <0.03   BNP Invalid input(s): POCBNP D-Dimer No results for input(s): DDIMER in the last 72 hours. Hemoglobin A1C No results for input(s): HGBA1C in the last 72 hours. Fasting Lipid Panel No results for input(s): CHOL, HDL, LDLCALC, TRIG, CHOLHDL, LDLDIRECT in the last 72 hours. Thyroid Function Tests No results for input(s): TSH, T4TOTAL, T3FREE, THYROIDAB in the last 72 hours.  Invalid input(s): FREET3  Telemetry    NSR - Personally Reviewed  ECG    NA - Personally Reviewed  Radiology    Ct Angio Chest Pe W And/or Wo Contrast  Result Date: 11/08/2016 CLINICAL DATA:  Chest pain. Pain for 2 days following dialysis. Hypotensive. EXAM: CT ANGIOGRAPHY  CHEST WITH CONTRAST TECHNIQUE: Multidetector CT imaging of the chest was performed using the standard protocol during bolus administration of intravenous contrast. Multiplanar CT image reconstructions and MIPs were obtained to evaluate the vascular anatomy. CONTRAST:  75 mL Isovue COMPARISON:  CT 10/31/2016 FINDINGS: Cardiovascular: No filling defects within pulmonary artery to suggest acute pulmonary embolism. No acute findings aorta great vessels. Moderate size pleural effusion is present measuring 2 cm in thickness along the LEFT ventricle. Mediastinum/Nodes: No axillary adenopathy. LEFT supraclavicular node measures 11 mm (image 11, series 6). RIGHT lower paratracheal lymph node measures 9 mm. Prevascular lymph node measures 12 mm. Esophagus normal. Lungs/Pleura: There is para bronchovascular thickening in the inferior LEFT lower lobe and mild consolidation. Small bilateral effusions. Pneumothorax. No pulmonary mass identified. Upper Abdomen: Limited view of the liver, kidneys, pancreas are unremarkable. Normal adrenal glands. Musculoskeletal: No aggressive osseous lesion. Severe compression fracture of the T6 vertebral body unchanged. Review of the MIP images confirms the above findings. IMPRESSION: 1. Moderate to large pericardial effusion is new from 10/31/2016 2. No evidence of pulmonary embolism. 3. New bilateral small effusions and LEFT basilar atelectasis versus infiltrate or pneumonitis. 4. Mild mediastinal and RIGHT supraclavicular adenopathy. Favor reactive adenopathy. Electronically Signed   By: Genevive Bi M.D.   On: 11/08/2016 09:34   Dg Chest Portable 1 View  Result Date: 11/08/2016 CLINICAL DATA:  Pericardiocentesis. EXAM: PORTABLE CHEST 1 VIEW COMPARISON:  CT 11/08/2016.  Chest x-ray 11/08/2016. FINDINGS: Cardiomegaly with bilateral pulmonary interstitial prominence consistent with congestive heart failure. Small left pleural effusion. No pneumothorax. IMPRESSION: Congestive heart failure  with bilateral pulmonary interstitial edema. Degree of cardiomegaly is unchanged from prior exam . Electronically Signed   By: Maisie Fus  Register   On: 11/08/2016 12:13    Cardiac Studies   Echo - Left ventricle: The cavity size was normal. Systolic function was   normal. The estimated ejection fraction was in the range of 50%   to 55%. Wall motion was normal; there were no regional wall   motion abnormalities. - Aortic valve: There was mild regurgitation. - Mitral valve: There was mild regurgitation. - Tricuspid valve: There was mild regurgitation. - Pericardium, extracardiac: A trivial pericardial effusion was   identified anterior to the  heart. There was a moderate-sized left   pleural effusion.   Patient Profile     46 yo female w/ hx Bipolar, ESRD on HD, Sz, CVA, Hypothyroid, blindness, chronic pain issues, was admitted 01/09 from [facility] for pericardial effusion w/ tamponade. S/p pericardial drain.  Assessment & Plan    Pericardial Tamponade:   Drain out today.  No drainage overnight.  No recurrent effusion on echo.  Will follow up in the days to come.    Note no bacteria in pericardial fluid.  There were WBCs in a turbid, bloody fluid.  PMNs.   White cells are up on CBC.  Some values on pericardial labs support an exudative process.  Cytology pending.   I will ask ID to see.    Cardiogenic Shock:  On low dose Levophed.  Wean as tolerated.    Chest pain:  Non anginal.  Chronic pain on pain management.   Renal failure:   Bedside dialysis today.    Panhypopit:  I switched to PO meds yesterday.    Signed, Rollene Rotunda, MD  11/10/2016, 8:56 AM

## 2016-11-10 NOTE — Procedures (Signed)
Patient seen on Hemodialysis. QB 400, UF goal 2.5L Treatment adjusted as needed.  Zetta Bills MD [facility]. Office # [phone] Pager # [phone] 9:41 AM

## 2016-11-10 NOTE — Progress Notes (Signed)
Arrived to patient room 4N-29 at [phone].  Reviewed treatment plan and this RN agrees.  Report received from bedside RN, Dahlia Client.  Consent verified.  Patient A & O X 4, tearful. Lung sounds clear and diminished to ausculation in all fields. Generalized edema. Cardiac: NSR.  Prepped RUAVG with alcohol and cannulated with two 15 gauge needles.  Pulsation of blood noted.  Flushed access well with saline per protocol.  Connected and secured lines and initiated tx at 0915.  UF goal of 2500 mL and net fluid removal of 2000 mL.  Will continue to monitor.  Upon arrival patient stated that she would not run prescribed treatment of 3 hours.  Nephrology notified.

## 2016-11-10 NOTE — Progress Notes (Signed)
Patient ID: Erin [name], female   DOB: 1971-07-31, 46 y.o.   MRN: [id number]  [facility] Progress Note   Assessment/ Plan:   1. Pericardial effusion/tamponade: Improved hemodynamic parameters status post pericardiocentesis- continues to require low dose Levophed with efforts at weaning off based on her blood pressures. 2. End-stage renal disease: Getting hemodialysis today per her usual outpatient Tuesday/Thursday/Saturday schedule-she has already voiced her discontent about requiring dialysis 2 days in a row and informed the dialysis nurse earlier that she wouldn't run her full time (we will try and continue to encourage her on compliance). 3. Hyponatremia: Likely secondary to current volume status/free water intake between dialysis treatments. Monitor with hemodialysis. 4. Hypoalbuminemia: Discussed optimal compliance with hemodialysis and continued nutritional supplementation. 5. Anemia of chronic kidney disease: Hemoglobin appears to be within acceptable limits-recently 11.5. We'll monitor closely status post pericardiocentesis. Not on ESA at this time. 6. Metabolic bone disease:  phosphorus levels are acceptable, continue to monitor  Subjective:   No acute events overnight    Objective:   BP 138/75   Pulse 64   Temp 97.8 F (36.6 C)   Resp 10   Ht 4\' 10"  (1.473 m)   Wt 67.2 kg (148 lb 2.4 oz)   LMP 11/21/2007 (Approximate) Comment: No period since 2009,due to"DVT" per pt since they stopped BCP...no periods  SpO2 94%   BMI 30.96 kg/m   Physical Exam: HQR:FXJOITGPQDI resting in bed, getting hemodialysis  YME:BRAXE regular rhythm, normal rate, S1 and S2 normal  Resp:Clear to auscultation, no rales  NMM:HWKG, obese, nontender  SUP:JSRPR ankle edema  Labs: BMET  Recent Labs Lab 11/06/16 0635 11/08/16 0733 11/08/16 1823 11/09/16 0410 11/10/16 0458  NA 134* 133*  --  132* 131*  K 5.6* 4.2  --  5.6* 4.1  CL 100* 102  --  106 97*  CO2 22 18*  --  17* 23   GLUCOSE 53* 107* 123* 145* 104*  BUN 46* 54*  --  44* 23*  CREATININE 6.50* 8.67*  --  7.84* 5.00*  CALCIUM 8.7* 7.5*  --  7.7* 7.6*  PHOS  --   --  5.1* 4.5 5.3*   CBC  Recent Labs Lab 11/06/16 0635 11/08/16 0733 11/08/16 1823 11/09/16 0410 11/10/16 0458  WBC 6.0 11.6* 22.2* 25.7* 25.1*  NEUTROABS 3.0 9.5* 21.6*  --   --   HGB 12.5 11.5* 10.6* 10.7* 9.3*  HCT 37.8 35.5 32.5* 32.2* 27.7*  MCV 93.5 94.6 93.4 92.8 90.8  PLT 166 174 152 162 169   Medications:    . fenofibrate  160 mg Oral Daily  . gabapentin  400 mg Oral QHS  . hydrocortisone  10 mg Oral Daily  . hydrocortisone  5 mg Oral QHS  . levothyroxine  112 mcg Oral QAC breakfast  . loratadine  10 mg Oral Daily  . mouth rinse  15 mL Mouth Rinse BID  . pantoprazole  40 mg Oral Daily  . pravastatin  40 mg Oral q1800  . risperiDONE  2 mg Oral QHS  . risperiDONE  4 mg Oral Daily  . sertraline  50 mg Oral Daily  . sevelamer carbonate  2,400 mg Oral BID WC  . sodium chloride flush  10-40 mL Intracatheter Q12H  . topiramate  250 mg Oral BID  . traZODone  100 mg Oral QHS   Zetta Bills, MD 11/10/2016, 9:42 AM

## 2016-11-10 NOTE — Progress Notes (Signed)
eLink Physician-Brief Progress Note Patient Name: Erin [name] DOB: 1971/02/22 MRN: [id number]   Date of Service  11/10/2016  HPI/Events of Note  Multiple issues: 1. Request for nicotine patch and 2. N/V.  eICU Interventions  Will order: 1. Nicotine patch 21 mg to skin now and Q day. 2. Zofran 4 mg IV Q 6 hours PRN N/V.     Intervention Category Intermediate Interventions: Other:  Lenell Antu 11/10/2016, 9:22 PM

## 2016-11-10 NOTE — Consult Note (Addendum)
[facility]    Date of Admission:  11/08/2016          Reason for Consult: Pericardial effusion with tamponade     Referring Physician: Dr. Angelina Sheriff  Principal Problem:   Cardiac/pericardial tamponade Active Problems:   Shock circulatory (HCC)   Anemia   Bipolar 1 disorder, mixed, moderate (HCC)   Hypothyroidism   End stage renal disease on dialysis [facility])   Seizure disorder (HCC)   Blindness   History of CVA (cerebrovascular accident)   . fenofibrate  160 mg Oral Daily  . gabapentin  400 mg Oral QHS  . hydrocortisone  10 mg Oral Daily  . hydrocortisone  5 mg Oral QHS  . levothyroxine  112 mcg Oral QAC breakfast  . loratadine  10 mg Oral Daily  . mouth rinse  15 mL Mouth Rinse BID  . pantoprazole  40 mg Oral Daily  . pravastatin  40 mg Oral q1800  . risperiDONE  2 mg Oral QHS  . risperiDONE  4 mg Oral Daily  . sertraline  50 mg Oral Daily  . sevelamer carbonate  2,400 mg Oral BID WC  . sodium chloride flush  10-40 mL Intracatheter Q12H  . topiramate  250 mg Oral BID  . traZODone  100 mg Oral QHS    Recommendations: 1. Observe off of antibiotics 2. Left over pericardial fluid to be sent for AFB stain and culture, adenosine deaminase And PCR's for M tb, enterovirus, CMV, and EBV 3. Blood for ANA, rheumatoid factor, TSH and HIV antibody and QuantiFERON gold TB assay  Assessment: It is not clear what has caused her large pericardial effusion. The fact that it appears to be exudative and there is a predominance of neutrophils suggest infection. I seriously doubt that she has bacterial pericarditis given the appearance of the fluid, negative initial culture and her relatively benign exam. Tuberculosis pericarditis is less likely given the lack of systemic illness and the predominance of neutrophils. Nonetheless I will send remaining pericardial fluid for further AFB testing. There are many different infectious diseases, especially  viral, that can cause pericarditis. HIV infection is now recognized as a relatively common cause of large effusions. Also screen her for any evidence of autoimmune disease and symptomatic hypothyroidism.    HPI: Erin [name] is a 46 y.o. female with end-stage renal disease and hypothyroidism. She developed chest pain about 1 week ago. She was seen in the emergency department and released on 11/06/2016. She came back 2 days later with worsening chest pain and shortness of breath and was found to have a large pericardial effusion, hypotension and tamponade. She underwent emergent pericardiocentesis. 80 mL of straw-colored fluid was withdrawn. Random stain and culture of that fluid are negative. She was given one dose of vancomycin and piperacillin tazobactam and transferred here where she underwent a second pericardiocentesis. Red turbid fluid was obtained. Gram stain and culture are negative. Pericardial fluid protein and LDH are elevated suggesting an exudative process. The white blood cell count was 9200 with 97% segmented neutrophils. Cytologic exam was negative for malignant cells. Her pericardial drain has been removed. She is feeling better.   Review of Systems: Review of Systems  Constitutional: Negative for chills, diaphoresis, fever, malaise/fatigue and weight loss.  HENT: Negative for sore throat.   Respiratory: Positive for shortness of breath. Negative for cough and sputum production.   Cardiovascular: Positive for chest pain.  Gastrointestinal: Negative for  abdominal pain, diarrhea, nausea and vomiting.  Musculoskeletal: Negative for joint pain and myalgias.  Skin: Negative for rash.  Neurological: Negative for headaches.    Past Medical History:  Diagnosis Date  . Anxiety   . Asthma   . Bipolar affect, depressed (HCC)   . Blind   . Chronic headaches    Seeing Pain Management  . Constipation   . Depression   . Epilepsy (HCC)   . Fibromyalgia   . History of CVA  (cerebrovascular accident)   . History of DVT (deep vein thrombosis)   . Hyperlipidemia   . Hypothyroid   . Kidney failure    Currently on Dialysis  . Narcolepsy   . 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 Psychosis   . Renal insufficiency     Social History  Substance Use Topics  . Smoking status: Current Every Day Smoker    Packs/day: 0.50    Years: 16.00    Types: Cigarettes  . Smokeless tobacco: Never Used  . Alcohol use 6.0 oz/week    10 Shots of liquor per week     Comment: per patient drinks liquor twice a week    Family History  Problem Relation Age of Onset  . Heart attack Mother   . Migraines Mother   . Aneurysm Mother   . Diabetes Father    Allergies  Allergen Reactions  . Dhea [Nutritional Supplements] Anaphylaxis    Patient states medication is DHE for migraines, not DHEA  . Demerol [Meperidine] Other (See Comments)    Reaction:  Hallucinations   . Floxin [Ofloxacin] Other (See Comments)    Reaction:  Hallucinations   . Nsaids Nausea And Vomiting and Swelling  . Nubain [Nalbuphine Hcl] Other (See Comments)    Reaction:  Hallucinations   . Phenergan [Promethazine Hcl] Other (See Comments)    Reaction:  Restless legs   . Stadol [Butorphanol] Other (See Comments)    Reaction:  Hallucinations   . Erythromycin Diarrhea and Rash    OBJECTIVE: Blood pressure 121/70, pulse 84, temperature 98.2 F (36.8 C), temperature source Oral, resp. rate 14, height 4\' 10"  (1.473 m), weight 143 lb 11.8 oz (65.2 kg), last menstrual period 11/21/2007, SpO2 91 %.  Physical Exam  Constitutional: She is oriented to person, place, and time.  She is resting quietly in bed. She is currently on hemodialysis via her right upper arm fistula.  HENT:  Mouth/Throat: No oropharyngeal exudate.  Eyes: Conjunctivae are normal.  She is blind.  Cardiovascular: Normal rate and regular rhythm.   Murmur heard. 1 to 2/6 systolic murmur.    Pulmonary/Chest: Effort normal and breath sounds normal. She has no wheezes. She has no rales.  Abdominal: Soft. There is no tenderness.  Musculoskeletal: Normal range of motion. She exhibits no edema or tenderness.  Neurological: She is alert and oriented to person, place, and time.  Skin: No rash noted.  Psychiatric: Mood and affect normal.    Lab Results Lab Results  Component Value Date   WBC 25.1 (H) 11/10/2016   HGB 9.3 (L) 11/10/2016   HCT 27.7 (L) 11/10/2016   MCV 90.8 11/10/2016   PLT 169 11/10/2016    Lab Results  Component Value Date   CREATININE 5.00 (H) 11/10/2016   BUN 23 (H) 11/10/2016   NA 131 (L) 11/10/2016   K 4.1 11/10/2016   CL 97 (L) 11/10/2016   CO2 23 11/10/2016    Lab Results  Component Value Date  ALT <5 (L) 11/08/2016   AST 27 11/08/2016   ALKPHOS 125 11/08/2016   BILITOT 0.5 11/08/2016     Microbiology: Recent Results (from the past 240 hour(s))  Blood Culture (routine x 2)     Status: None (Preliminary result)   Collection Time: 11/08/16  7:33 AM  Result Value Ref Range Status   Specimen Description BLOOD R EJ  Final   Special Requests   Final    BOTTLES DRAWN AEROBIC AND ANAEROBIC AER ANA   Culture NO GROWTH 2 DAYS  Final   Report Status PENDING  Incomplete  Urine culture     Status: Abnormal   Collection Time: 11/08/16  7:36 AM  Result Value Ref Range Status   Specimen Description URINE, RANDOM  Final   Special Requests NONE  Final   Culture MULTIPLE SPECIES PRESENT, SUGGEST RECOLLECTION (A)  Final   Report Status 11/09/2016 FINAL  Final  Blood Culture (routine x 2)     Status: None (Preliminary result)   Collection Time: 11/08/16  7:38 AM  Result Value Ref Range Status   Specimen Description BLOOD R EJ  Final   Special Requests   Final    BOTTLES DRAWN AEROBIC AND ANAEROBIC AER ANA   Culture NO GROWTH 2 DAYS  Final   Report Status PENDING  Incomplete  Body fluid culture     Status: None (Preliminary result)    Collection Time: 11/08/16 11:06 AM  Result Value Ref Range Status   Specimen Description PERICARDIAL [facility]  Final   Special Requests NONE  Final   Gram Stain   Final    MANY WBC PRESENT, PREDOMINANTLY PMN NO ORGANISMS SEEN    Culture   Final    NO GROWTH 2 DAYS Performed at [facility]    Report Status PENDING  Incomplete  Surgical pcr screen     Status: Abnormal   Collection Time: 11/08/16  1:43 PM  Result Value Ref Range Status   MRSA, PCR NEGATIVE NEGATIVE Final   Staphylococcus aureus POSITIVE (A) NEGATIVE Final    Comment:        The Xpert SA Assay (FDA approved for NASAL specimens in patients over 66 years of age), is one component of a comprehensive surveillance program.  Test performance has been validated by [facility] for patients greater than or equal to 29 year old. It is not intended to diagnose infection nor to guide or monitor treatment.   Culture, body fluid-bottle     Status: None (Preliminary result)   Collection Time: 11/08/16  3:21 PM  Result Value Ref Range Status   Specimen Description PERICARDIAL  Final   Special Requests BOTTLES DRAWN AEROBIC AND ANAEROBIC 10CC  Final   Culture NO GROWTH < 24 HOURS  Final   Report Status PENDING  Incomplete  Gram stain     Status: None   Collection Time: 11/08/16  3:21 PM  Result Value Ref Range Status   Specimen Description PERICARDIAL  Final   Special Requests NONE  Final   Gram Stain   Final    ABUNDANT WBC PRESENT, PREDOMINANTLY PMN NO ORGANISMS SEEN    Report Status 11/08/2016 FINAL  Final    Cliffton Asters, MD [facility] [phone] pager   [phone] cell 11/10/2016, 1:09 PM

## 2016-11-10 NOTE — Progress Notes (Signed)
PULMONARY / CRITICAL CARE MEDICINE   Name: Erin [name] MRN: [id number] DOB: 12/23/1970    ADMISSION DATE:  11/08/2016 CONSULTATION DATE:  11/08/16  REFERRING MD:  [facility] / Dr. Khan  CHIEF COMPLAINT:  Chest Pain  HISTORY OF PRESENT ILLNESS:  45 y/o F, smoker, with a h/o CVA, DVT (not on anticoagulation), kidney failure (on dialysis T,Th,S), anxiety, asthma, epilepsy, blindness (since childhood), CP with a recent cardiac catheterization (10/28/16) which was unremarkable for coronary disease and recent ER visit at [facility] on 1/7 for chest pain.  No acute findings were noted and she was discharged home.  She continued to have chest pain at home.    She returned to the ER again on 1/9 for evaluation. The patient reported ongoing chest pain that she describes as pressure and shortness of breath.  She was found to be hypotensive in the ER with pressures in the 60's.  Chest CT was evaluated and was negative for PE but showed a moderate size pericardial effusion, and left pleural effusion.  CXR on 1/9 showed bilateral pulmonary interstitial edema and cardiomegaly.  ECHO was performed which confirmed pericardial effusion with concern for RV collapse.  Per chart review, Dr. Khan performed a bedside pericardiocentesis which removed 80 cc straw colored fluid without a change in BP but with decreased RV collapse. She was subsequently transferred to [facility] for further evaluation.  SUBJECTIVE: She has some chest pain remaining. Improved compared to prior to admission. Denies dyspnea. About 71mMarland KiNew Jersey Surgery Center [phone] KGlencoe Regional Health [phone] Arh [phone] Center Of [facility] [phone] [phone] [phone] KMarin Ophthalmic Surgery [phone] [facility] [phone] [phone] Eye [facility] Skin Centers [phone]. Luke'S Lakeside [phone] [phone] [phone] Laurel Surgery [facility] [phone] Specialty Surgery [phone] [facility]oscopy [facility] [phone] KOverton Brooks Va Medical [phone] [phone] Springs County Memorial [phone] [phone] First Healthcare [facility] Dba Medical [phone] Endosurgery [phone] [facility]667 [phone] [phone] Sports Surgical LLC Dba [facility] Surgery [phone] [facility] [phone] [phone] Canyon Medical [phone] KEncompass Health Rehabilitation Hospital The [phone] [phone] Medical [phone] [phone] [phone] [phone] drain this morning.  VITAL SIGNS: BP (!) 84/52   Pulse 61   Temp 97.7 F (36.5 C) (Oral)   Resp (!) 7   Ht 4\' 10"  (1.473 m)   Wt 148 lb 2.4 oz (67.2 kg)   LMP 11/21/2007 (Approximate) Comment: No period since 2009,due to"DVT" per pt since they stopped BCP...no periods  SpO2 93%   BMI 30.96 kg/m   INTAKE / OUTPUT: I/O last 3 completed shifts: In: 725.9 [P.O.:240; I.V.:485.9] Out: 3010  [Urine:150; Drains:116; Other:2744]  PHYSICAL EXAMINATION: General:  Chronically ill appearing female in NAD  Neuro:  Awake, alert, oriented, speech clear, MAE HEENT:  MM pink/moist Cardiovascular:  RRR Lungs:  Even/non-labored, lungs bilaterally clear Abdomen:  soft, nontender, nondistended  Musculoskeletal:  No acute deformities  Skin:  Warm/dry, no edema, multiple old graft sites, RUE AVF   LABS:  BMET  Recent Labs Lab 11/08/16 0733 11/08/16 1823 11/09/16 0410 11/10/16 0458  NA 133*  --  132* 131*  K 4.2  --  5.6* 4.1  CL 102  --  106 97*  CO2 18*  --  17* 23  BUN 54*  --  44* 23*  CREATININE 8.67*  --  7.84* 5.00*  GLUCOSE 107* 123* 145* 104*    Electrolytes  Recent Labs Lab 11/08/16 0733 11/08/16 1823 11/09/16 0410 11/10/16 0458  CALCIUM 7.5*  --  7.7* 7.6*  MG  --   --  1.5*  --   PHOS  --  5.1* 4.5 5.3*    CBC  Recent Labs Lab 11/08/16 1823 11/09/16 0410 11/10/16 0458  WBC 22.2* 25.7* 25.1*  HGB 10.6* 10.7* 9.3*  HCT 32.5* 32.2* 27.7*  PLT 152 162 169    Coag's  Recent Labs Lab 11/08/16 0733  APTT 48*  INR 1.33    Sepsis Markers  Recent Labs Lab 11/08/16 0733  LATICACIDVEN 2.4*  PROCALCITON 0.67   Liver Enzymes  Recent Labs Lab 11/06/16 0635 11/08/16 0733 11/09/16 0410 11/10/16  0458  AST 32 27  --   --   ALT <5* <5*  --   --   ALKPHOS 137* 125  --   --   BILITOT 0.6 0.5  --   --   ALBUMIN 3.9 2.9* 2.5* 2.4*    Cardiac Enzymes  Recent Labs Lab 11/06/16 0635 11/08/16 0733  TROPONINI <0.03 <0.03    Glucose  Recent Labs Lab 11/08/16 0738  GLUCAP 94    Imaging No results found.  STUDIES:  CTA chest 01/09 > mod to large pericardial effusion, no PE, small b/l pleural effusions, mild right supraclavicular adenopathy. Echo 01/09 > LV EF 50% to 55%, moderate tamponade physiology. A moderate to large residual pericardial effusion Echo 1/10 > LV EF 50-55%, trivial pericardial effusion, moderate sized left  pleural effusion.  CULTURES: Pericardial fluid 1/9 >> gram stain neg  ANTIBIOTICS: None.  SIGNIFICANT EVENTS: 01/09 > admit. 1/9 > pericardiocentesis with Dr. Excell Seltzer. fluid removed  LINES/TUBES: R fem CVL 01/09 > Pericardial drain 1/9>  DISCUSSION: 46 y.o. female admitted with large pericardial effusion with concern for tamponade.  Was seen at [facility], had 80cc drained via pericardiocentesis then transferred to [facility] for further management.  ASSESSMENT / PLAN:  CARDIOVASCULAR A:  Obstructive shock secondary cardiac tamponade. Pericardial effusion with tamponade - s/p bedside pericardiocentesis 01/09 with 80cc straw colored fluid drained. Effusion presumed due to ESRD. Hx HLD. P:  Continue levophed and wean as tolerated for MAP goal of 65.  Pericardial drain per cardiology Continue preadmission fenofibrate, lovastatin.  RENAL A:   ESRD (T/Th/Sat - last dialyzed 01/06). Hyponatremia - presumed hypervolemic. P:   Fluids at [facility]. Nephrology following. Intermittent HD BMP in AM.  PULMONARY A: Hx Asthma. P:   BD's.  HEMATOLOGIC A:   VTE Prophylaxis. Hx DVT. P:  SCD's only for now. CBC in AM.  INFECTIOUS A:   No indication of infection. P:   No interventions required. Follow fluid analysis.  ENDOCRINE A:   Hx panhypopituitarism, hypothyroidism.  P:   Steroids converted to PO Continue preadmission synthroid, change to IV formulation. Holding home florinef.  NEUROLOGIC A:   Hx psychosis / bipolar affect, narcolepsy, CVA, fibromyalgia, epilepsy, depression, chronic headaches, legally blind, anxiety. P:   Restart home gabapentin, methylphenidate, oxy IR, risperidone, sertraline, topiramate, trazodone, melatonin.  Family updated: Pt's brother updated by Dr. Excell Seltzer regarding plan of care 1/9  Interdisciplinary Family Meeting v Palliative Care Meeting:  Due by: 11/14/16.  Griffin Basil, MD  Internal Medicine PGY-3 Pager: [phone]   or [phone] 11/10/2016, 8:14 AM

## 2016-11-11 ENCOUNTER — Encounter (HOSPITAL_COMMUNITY): Payer: Self-pay | Admitting: General Practice

## 2016-11-11 LAB — BODY FLUID CULTURE: Culture: NO GROWTH

## 2016-11-11 LAB — FERRITIN: FERRITIN: 475 ng/mL — AB (ref 11–307)

## 2016-11-11 LAB — RENAL FUNCTION PANEL
ALBUMIN: 2.4 g/dL — AB (ref 3.5–5.0)
Anion gap: 9 (ref 5–15)
BUN: 11 mg/dL (ref 6–20)
CALCIUM: 7.7 mg/dL — AB (ref 8.9–10.3)
CO2: 28 mmol/L (ref 22–32)
CREATININE: 3.33 mg/dL — AB (ref 0.44–1.00)
Chloride: 96 mmol/L — ABNORMAL LOW (ref 101–111)
GFR calc Af Amer: 18 mL/min — ABNORMAL LOW (ref >60)
GFR, EST NON AFRICAN AMERICAN: 16 mL/min — AB (ref >60)
Glucose, Bld: 103 mg/dL — ABNORMAL HIGH (ref 65–99)
PHOSPHORUS: 3.7 mg/dL (ref 2.5–4.6)
Potassium: 3.5 mmol/L (ref 3.5–5.1)
SODIUM: 133 mmol/L — AB (ref 135–145)

## 2016-11-11 LAB — CBC
HCT: 25.8 % — ABNORMAL LOW (ref 36.0–46.0)
Hemoglobin: 8.6 g/dL — ABNORMAL LOW (ref 12.0–15.0)
MCH: 30.1 pg (ref 26.0–34.0)
MCHC: 33.3 g/dL (ref 30.0–36.0)
MCV: 90.2 fL (ref 78.0–100.0)
PLATELETS: 149 10*3/uL — AB (ref 150–400)
RBC: 2.86 MIL/uL — AB (ref 3.87–5.11)
RDW: 18.1 % — AB (ref 11.5–15.5)
WBC: 15.9 10*3/uL — AB (ref 4.0–10.5)

## 2016-11-11 LAB — IRON AND TIBC
IRON: 26 ug/dL — AB (ref 28–170)
Saturation Ratios: 23 % (ref 10.4–31.8)
TIBC: 113 ug/dL — AB (ref 250–450)
UIBC: 87 ug/dL

## 2016-11-11 LAB — HIV ANTIBODY (ROUTINE TESTING W REFLEX): HIV SCREEN 4TH GENERATION: NONREACTIVE

## 2016-11-11 LAB — ANTINUCLEAR ANTIBODIES, IFA: ANTINUCLEAR ANTIBODIES, IFA: NEGATIVE

## 2016-11-11 LAB — RHEUMATOID FACTOR: RHEUMATOID FACTOR: 11.8 [IU]/mL (ref 0.0–13.9)

## 2016-11-11 MED ORDER — HYDROMORPHONE HCL 2 MG PO TABS
2.0000 mg | ORAL_TABLET | Freq: Once | ORAL | Status: AC
  Administered 2016-11-11: 2 mg via ORAL
  Filled 2016-11-11: qty 1

## 2016-11-11 MED ORDER — POLYETHYLENE GLYCOL 3350 17 G PO PACK
17.0000 g | PACK | Freq: Every day | ORAL | Status: DC
  Administered 2016-11-12 – 2016-11-14 (×2): 17 g via ORAL
  Filled 2016-11-11 (×3): qty 1

## 2016-11-11 MED ORDER — DARBEPOETIN ALFA 60 MCG/0.3ML IJ SOSY
60.0000 ug | PREFILLED_SYRINGE | INTRAMUSCULAR | Status: DC
  Administered 2016-11-12: 60 ug via INTRAVENOUS
  Filled 2016-11-11: qty 0.3

## 2016-11-11 NOTE — Progress Notes (Signed)
Patient ID: Erin [name], female   DOB: Mar 20, 1971, 46 y.o.   MRN: [id number]  [facility] Progress Note   Assessment/ Plan:   1. Pericardial effusion/tamponade: Improved hemodynamic parameters status post pericardiocentesis- Weaning down on her Levophed-currently on low-dose that should hopefully be discontinued later today. 2. End-stage renal disease: usual outpatient Tuesday/Thursday/Saturday schedule. Will order for hemodialysis again tomorrow based on her usual outpatient schedule---counseled her at length about compliance 3. Hyponatremia: Likely secondary to current volume status/free water intake between dialysis treatments. Monitor with hemodialysis. 4. Hypoalbuminemia: Discussed optimal compliance with hemodialysis and continued nutritional supplementation. On a regular diet. 5. Anemia of chronic kidney disease:  decreased hemoglobin noted-likely associated with pericardiocentesis/critical illness, will prescribe Aranesp with hemodialysis tomorrow. 6. Metabolic bone disease:  phosphorus levels are acceptable, continue to monitor  Subjective:   No acute events overnight    Objective:   BP 111/67   Pulse 70   Temp 98.5 F (36.9 C) (Oral)   Resp 10   Ht 4\' 10"  (1.473 m)   Wt 65 kg (143 lb 4.8 oz)   LMP 11/21/2007 (Approximate) Comment: No period since 2009,due to"DVT" per pt since they stopped BCP...no periods  SpO2 95%   BMI 29.95 kg/m   Physical Exam: TMB:PJPETKKOECX resting in bed  FQH:KUVJD regular rhythm, normal rate, S1 and S2 normal  Resp:Clear to auscultation, no rales  YNX:GZFP, obese, nontender  OIP:PGFQM ankle edema  Labs: BMET  Recent Labs Lab 11/06/16 0635 11/08/16 0733 11/08/16 1823 11/09/16 0410 11/10/16 0458 11/11/16 0415  NA 134* 133*  --  132* 131* 133*  K 5.6* 4.2  --  5.6* 4.1 3.5  CL 100* 102  --  106 97* 96*  CO2 22 18*  --  17* 23 28  GLUCOSE 53* 107* 123* 145* 104* 103*  BUN 46* 54*  --  44* 23* 11  CREATININE 6.50*  8.67*  --  7.84* 5.00* 3.33*  CALCIUM 8.7* 7.5*  --  7.7* 7.6* 7.7*  PHOS  --   --  5.1* 4.5 5.3* 3.7   CBC  Recent Labs Lab 11/06/16 0635 11/08/16 0733 11/08/16 1823 11/09/16 0410 11/10/16 0458 11/11/16 0405  WBC 6.0 11.6* 22.2* 25.7* 25.1* 15.9*  NEUTROABS 3.0 9.5* 21.6*  --   --   --   HGB 12.5 11.5* 10.6* 10.7* 9.3* 8.6*  HCT 37.8 35.5 32.5* 32.2* 27.7* 25.8*  MCV 93.5 94.6 93.4 92.8 90.8 90.2  PLT 166 174 152 162 169 149*   Medications:    . Chlorhexidine Gluconate Cloth  6 each Topical Daily  . fenofibrate  160 mg Oral Daily  . gabapentin  400 mg Oral QHS  . hydrocortisone  10 mg Oral Daily  . hydrocortisone  5 mg Oral QHS  . levothyroxine  112 mcg Oral QAC breakfast  . loratadine  10 mg Oral Daily  . mouth rinse  15 mL Mouth Rinse BID  . mupirocin ointment  1 application Nasal BID  . nicotine  21 mg Transdermal Daily  . pantoprazole  40 mg Oral Daily  . pravastatin  40 mg Oral q1800  . risperiDONE  2 mg Oral QHS  . risperiDONE  4 mg Oral Daily  . sertraline  50 mg Oral Daily  . sevelamer carbonate  2,400 mg Oral BID WC  . sodium chloride flush  10-40 mL Intracatheter Q12H  . topiramate  250 mg Oral BID  . traZODone  100 mg Oral QHS   Zetta Bills, MD  11/11/2016, 9:15 AM

## 2016-11-11 NOTE — Progress Notes (Signed)
Patient Name: Erin [name] Date of Encounter: 11/11/2016  Primary Cardiologist: Dr. Fausto Skillern Problem List     Principal Problem:   Cardiac/pericardial tamponade Active Problems:   Anemia   Bipolar 1 disorder, mixed, moderate (HCC)   Hypothyroidism   End stage renal disease on dialysis [facility])   Seizure disorder (HCC)   Blindness   History of CVA (cerebrovascular accident)   Shock circulatory (HCC)     Subjective   Feels better this morning.  Mild chest pain.  No acute SOB.   Inpatient Medications    Scheduled Meds: . Chlorhexidine Gluconate Cloth  6 each Topical Daily  . [START ON 11/12/2016] darbepoetin (ARANESP) injection - DIALYSIS  60 mcg Intravenous Q Sat-HD  . fenofibrate  160 mg Oral Daily  . gabapentin  400 mg Oral QHS  . hydrocortisone  10 mg Oral Daily  . hydrocortisone  5 mg Oral QHS  . levothyroxine  112 mcg Oral QAC breakfast  . loratadine  10 mg Oral Daily  . mouth rinse  15 mL Mouth Rinse BID  . mupirocin ointment  1 application Nasal BID  . nicotine  21 mg Transdermal Daily  . pantoprazole  40 mg Oral Daily  . pravastatin  40 mg Oral q1800  . risperiDONE  2 mg Oral QHS  . risperiDONE  4 mg Oral Daily  . sertraline  50 mg Oral Daily  . sevelamer carbonate  2,400 mg Oral BID WC  . sodium chloride flush  10-40 mL Intracatheter Q12H  . topiramate  250 mg Oral BID  . traZODone  100 mg Oral QHS   Continuous Infusions: . sodium chloride 10 mL/hr at 11/08/16 1350  . norepinephrine (LEVOPHED) Adult infusion 2 mcg/min (11/11/16 0830)   PRN Meds: sodium chloride, sodium chloride, sodium chloride, acetaminophen, albuterol, alteplase, heparin, lidocaine (PF), lidocaine-prilocaine, ondansetron (ZOFRAN) IV, oxyCODONE, pentafluoroprop-tetrafluoroeth, sodium chloride flush   Vital Signs    Vitals:   11/11/16 0745 11/11/16 0800 11/11/16 0826 11/11/16 0830  BP: (!) 94/56 (!) 95/56 92/68 111/67  Pulse: (!) 58 (!) 59 70   Resp: (!) 8 (!) 9 10     Temp:   98.5 F (36.9 C)   TempSrc:   Oral   SpO2: 96% 97% 95%   Weight:      Height:        Intake/Output Summary (Last 24 hours) at 11/11/16 1034 Last data filed at 11/11/16 0800  Gross per 24 hour  Intake           834.37 ml  Output             2000 ml  Net         -1165.63 ml   Filed Weights   11/10/16 0855 11/10/16 1215 11/11/16 0427  Weight: 148 lb 2.4 oz (67.2 kg) 143 lb 11.8 oz (65.2 kg) 143 lb 4.8 oz (65 kg)    Physical Exam    GEN: NAD.  Neck:  No JVD Cardiac:  Regular Rate and Rhythm, no murmurs, rubs, or gallops.  No edema.  Radials/DP/PT 2+  and equal bilaterally.  Respiratory:  Respirations  regular and unlabored, clear to auscultation bilaterally. GI: Soft, nontender, nondistended, BS + x 4. Skin: warm and dry, no rash. Neuro:   Strength and sensation are intact, blind Psych:  AAOx3.  Normal affect.  Labs    CBC  Recent Labs  11/08/16 1823  11/10/16 0458 11/11/16 0405  WBC 22.2*  < > 25.1*  15.9*  NEUTROABS 21.6*  --   --   --   HGB 10.6*  < > 9.3* 8.6*  HCT 32.5*  < > 27.7* 25.8*  MCV 93.4  < > 90.8 90.2  PLT 152  < > 169 149*  < > = values in this interval not displayed. Basic Metabolic Panel  Recent Labs  11/09/16 0410 11/10/16 0458 11/11/16 0415  NA 132* 131* 133*  K 5.6* 4.1 3.5  CL 106 97* 96*  CO2 17* 23 28  GLUCOSE 145* 104* 103*  BUN 44* 23* 11  CREATININE 7.84* 5.00* 3.33*  CALCIUM 7.7* 7.6* 7.7*  MG 1.5*  --   --   PHOS 4.5 5.3* 3.7   Liver Function Tests  Recent Labs  11/10/16 0458 11/11/16 0415  ALBUMIN 2.4* 2.4*   No results for input(s): LIPASE, AMYLASE in the last 72 hours. Cardiac Enzymes No results for input(s): CKTOTAL, CKMB, CKMBINDEX, TROPONINI in the last 72 hours. BNP Invalid input(s): POCBNP D-Dimer No results for input(s): DDIMER in the last 72 hours. Hemoglobin A1C No results for input(s): HGBA1C in the last 72 hours. Fasting Lipid Panel No results for input(s): CHOL, HDL, LDLCALC, TRIG,  CHOLHDL, LDLDIRECT in the last 72 hours. Thyroid Function Tests  Recent Labs  11/10/16 1500  TSH 0.364    Telemetry    NSR - Personally Reviewed  ECG    NA - Personally Reviewed  Radiology    No results found.  Cardiac Studies   Echo - Left ventricle: The cavity size was normal. Systolic function was   normal. The estimated ejection fraction was in the range of 50%   to 55%. Wall motion was normal; there were no regional wall   motion abnormalities. - Aortic valve: There was mild regurgitation. - Mitral valve: There was mild regurgitation. - Tricuspid valve: There was mild regurgitation. - Pericardium, extracardiac: A trivial pericardial effusion was   identified anterior to the heart. There was a moderate-sized left   pleural effusion.   Patient Profile     46 yo female w/ hx Bipolar, ESRD on HD, Sz, CVA, Hypothyroid, blindness, chronic pain issues, was admitted 01/09 from [facility] for pericardial effusion w/ tamponade. S/p pericardial drain.  Assessment & Plan    Pericardial Tamponade:   Drain out yesterday.   Will follow up in the days to come with a repeat echo.    Note no bacteria in pericardial fluid.  There were WBCs in a turbid, bloody fluid.  PMNs.   White cells are up on CBC.  Some values on pericardial labs support an exudative process.  Cytology pending.   I appreciate ID input.  No antibiotics at this point.  Labs including AFB and HIV testing pending.  Note WBC count is down.   It is clinically fitting that this was related to ESRD and missed dialysis but not clearly transudative fluid.    Cardiogenic Shock:  BP is improved.  Levo stopped today.    Chest pain:  Non anginal.  Chronic pain on pain management.   Renal failure:   Dialysis per renal.   Renal wants dialysis Sat.    Anemia:  No evidence of active bleeding and probably anemia of chronic disease with blood draws contributing.  Will check stool guaiac.    Panhypopit:  I switched to PO meds    Disposition:  Probably home on Sunday.    Signed, Rollene Rotunda, MD  11/11/2016, 10:34 AM

## 2016-11-11 NOTE — Progress Notes (Signed)
Patient ID: Erin [name], female   DOB: 1971-02-25, 46 y.o.   MRN: [id number]          [facility]    Date of Admission:  11/08/2016     Gram stain and routine cultures of her pericardial fluid are negative. AFB stain and culture are pending as are viral PCR's. Her ANA and rheumatoid factor were negative. Her TSH is normal. There is no indication to restart antibiotic therapy at this time. I agree with repeat echo in several days to see if her effusion is reaccumulating. Please call Dr. Daiva Eves [phone]) for any infectious disease questions this weekend.         Cliffton Asters, MD [facility] [phone] pager   [phone] cell

## 2016-11-11 NOTE — Progress Notes (Signed)
   11/11/16 1620  Clinical Encounter Type  Visited With Patient  Visit Type Other (Comment) ([place] consult)  Spiritual Encounters  Spiritual Needs Prayer;Emotional  Stress Factors  Patient Stress Factors None identified  Introduction to Pt. Listened to her story. Offered prayer.

## 2016-11-11 NOTE — Plan of Care (Signed)
[name] complained of recurrent chest pain this evening that was not appreciably decreased with her PRN dose of Oxycodone 5mg  PO.  She currently c/o "8/10" SSCP without radiation.  ECG is unchanged from baseline, HR and BP remain stable, and she is resting comfortably in bed.  Her cardiac exam is not appreciably changed from what is previously documented.  The pt requested 4mg  of IV dilaudid for this pain which I declined given the potency of this dose, her ESRD, and her recent hypotension.  After a discussion regarding her pain management over recent days I agreed to a one time dose of Dilaudid 2mg  PO and then to continue her Oxycodone 5mg  PO Q6H PRN as previously written.  She was in agreement with this plan.  Azalee Course MD

## 2016-11-12 LAB — CBC
HCT: 24.2 % — ABNORMAL LOW (ref 36.0–46.0)
Hemoglobin: 8.1 g/dL — ABNORMAL LOW (ref 12.0–15.0)
MCH: 30.3 pg (ref 26.0–34.0)
MCHC: 33.5 g/dL (ref 30.0–36.0)
MCV: 90.6 fL (ref 78.0–100.0)
PLATELETS: 149 10*3/uL — AB (ref 150–400)
RBC: 2.67 MIL/uL — ABNORMAL LOW (ref 3.87–5.11)
RDW: 17.8 % — AB (ref 11.5–15.5)
WBC: 7.3 10*3/uL (ref 4.0–10.5)

## 2016-11-12 LAB — BASIC METABOLIC PANEL
Anion gap: 8 (ref 5–15)
BUN: 28 mg/dL — ABNORMAL HIGH (ref 6–20)
CALCIUM: 7.4 mg/dL — AB (ref 8.9–10.3)
CO2: 27 mmol/L (ref 22–32)
CREATININE: 4.63 mg/dL — AB (ref 0.44–1.00)
Chloride: 94 mmol/L — ABNORMAL LOW (ref 101–111)
GFR calc non Af Amer: 11 mL/min — ABNORMAL LOW (ref >60)
GFR, EST AFRICAN AMERICAN: 12 mL/min — AB (ref >60)
Glucose, Bld: 84 mg/dL (ref 65–99)
Potassium: 4.2 mmol/L (ref 3.5–5.1)
Sodium: 129 mmol/L — ABNORMAL LOW (ref 135–145)

## 2016-11-12 LAB — ACID FAST SMEAR (AFB, MYCOBACTERIA): Acid Fast Smear: NEGATIVE

## 2016-11-12 LAB — ACID FAST SMEAR (AFB)

## 2016-11-12 LAB — MAGNESIUM: Magnesium: 1.7 mg/dL (ref 1.7–2.4)

## 2016-11-12 MED ORDER — DARBEPOETIN ALFA 60 MCG/0.3ML IJ SOSY
PREFILLED_SYRINGE | INTRAMUSCULAR | Status: AC
  Administered 2016-11-12: 60 ug via INTRAVENOUS
  Filled 2016-11-12: qty 0.3

## 2016-11-12 MED ORDER — OXYCODONE HCL 5 MG PO TABS
ORAL_TABLET | ORAL | Status: AC
  Filled 2016-11-12: qty 1

## 2016-11-12 NOTE — Progress Notes (Addendum)
Initial Nutrition Assessment  DOCUMENTATION CODES:   Not applicable  INTERVENTION:   Snacks  NUTRITION DIAGNOSIS:   Unintentional weight loss related to chronic illness as evidenced by 14 percent weight loss in two months.  GOAL:   Patient will meet greater than or equal to 90% of their needs  MONITOR:   PO intake, Supplement acceptance, Weight trends  REASON FOR ASSESSMENT:   Malnutrition Screening Tool    ASSESSMENT:   46 y/o F, smoker, with a h/o CVA, DVT (not on anticoagulation), kidney failure (on dialysis T,Th,S), anxiety, asthma, epilepsy, blindness (since childhood), CP with a recent cardiac catheterization (10/28/16) which was unremarkable for coronary disease and recent ER visit at [facility] on 1/7 for chest pain.  No acute findings were noted and she was discharged home.  She continued to have chest pain at home. She returned to the ER again on 1/9 for evaluation. The patient reported ongoing chest pain that she describes as pressure and shortness of breath.  She was found to be hypotensive in the ER with pressures in the 60's.  Chest CT was evaluated and was negative for PE but showed a moderate size pericardial effusion, and left pleural effusion.  CXR on 1/9 showed bilateral pulmonary interstitial edema and cardiomegaly.  ECHO was performed which confirmed pericardial effusion with concern for RV collapse.  Per chart review, Dr. Humphrey Rolls performed a bedside pericardiocentesis which removed 80 cc straw colored fluid without a change in BP but with decreased RV collapse. She was subsequently transferred to [facility] for further evaluation.   Met with pt in room today. Pt reports eating 100% meals. Pt reports poor appetite for several weeks pta. RD familiar with this pt from last admit. Pt has lost 23lbs(14%) in 2 months. This is severe. Pt with ESRD on HD. Pt likes Ensures. Pt hyponatremic today. Continue to monitor.   Medications reviewed and include: cortef, synthroid, nicotine,  protonix, miralax, oxycodone  Labs reviewed: Na 133(L), Cl 96(L), creat 3.33(H), Ca 7.7(L) adj. 8.98 wnl, Alb 2.4(L), P 3.7 wnl, Mg 1.5(L) 1/10 Iron 26(L), TIBC 113(L), ferritin 475(H) Wbc- 15.9(H), Hgb 8.6(L), Hct 25.8(L)  Nutrition-Focused physical exam completed. Findings are no fat depletion, no muscle depletion, and no edema.   Diet Order:  Diet regular Room service appropriate? Yes; Fluid consistency: Thin; Fluid restriction: 1500 mL Fluid  Skin:  Reviewed, no issues (AV graft R arm )  Last BM:  1/8- constipation   Height:   Ht Readings from Last 1 Encounters:  11/08/16 4' 10" (1.473 m)    Weight:   Wt Readings from Last 1 Encounters:  11/12/16 140 lb (63.5 kg)    Ideal Body Weight:  44 kg  BMI:  Body mass index is 29.26 kg/m.  Estimated Nutritional Needs:   Kcal:  1300-1600kcal/day   Protein:  78-91g/day   Fluid:  1.3L/day   EDUCATION NEEDS:   No education needs identified at this time  Koleen Distance, RD, LDN Pager #[phone]

## 2016-11-12 NOTE — Progress Notes (Signed)
Pt. C/O pain at CVC insertion site.  States started a couple of hours ago.  Site appears WNL.  non tender to palpation. No swelling noted.  Will continue to monitor.

## 2016-11-12 NOTE — Progress Notes (Signed)
Subjective:    No complaints this AM  Objective:   Temp:  [97.9 F (36.6 C)-98.3 F (36.8 C)] 97.9 F (36.6 C) (01/13 0459) Pulse Rate:  [64-81] 66 (01/13 0459) Resp:  [10-20] 20 (01/13 0459) BP: (91-127)/(57-73) 92/57 (01/13 0459) SpO2:  [92 %-100 %] 100 % (01/13 0459) Weight:  [140 lb (63.5 kg)] 140 lb (63.5 kg) (01/13 0459) Last BM Date: 11/07/16  Filed Weights   11/10/16 1215 11/11/16 0427 11/12/16 0459  Weight: 143 lb 11.8 oz (65.2 kg) 143 lb 4.8 oz (65 kg) 140 lb (63.5 kg)    Intake/Output Summary (Last 24 hours) at 11/12/16 0945 Last data filed at 11/12/16 0531  Gross per 24 hour  Intake            715.8 ml  Output              250 ml  Net            465.8 ml    Telemetry:  Exam:  General: NAD  HEENT: sclera clear, throat clear  Resp: CTAB  Cardiac: RRR, 2/6 systolic murmur at apex, no jvd  GI: abdomen soft, nt, nd  MSK:no LE edema  Neuro: no focal deficits  Psych: appropriate affect  Lab Results:  Basic Metabolic Panel:  Recent Labs Lab 11/09/16 0410 11/10/16 0458 11/11/16 0415  NA 132* 131* 133*  K 5.6* 4.1 3.5  CL 106 97* 96*  CO2 17* 23 28  GLUCOSE 145* 104* 103*  BUN 44* 23* 11  CREATININE 7.84* 5.00* 3.33*  CALCIUM 7.7* 7.6* 7.7*  MG 1.5*  --   --     Liver Function Tests:  Recent Labs Lab 11/06/16 0635 11/08/16 0733 11/09/16 0410 11/10/16 0458 11/11/16 0415  AST 32 27  --   --   --   ALT <5* <5*  --   --   --   ALKPHOS 137* 125  --   --   --   BILITOT 0.6 0.5  --   --   --   PROT 6.9 5.6*  --   --   --   ALBUMIN 3.9 2.9* 2.5* 2.4* 2.4*    CBC:  Recent Labs Lab 11/09/16 0410 11/10/16 0458 11/11/16 0405  WBC 25.7* 25.1* 15.9*  HGB 10.7* 9.3* 8.6*  HCT 32.2* 27.7* 25.8*  MCV 92.8 90.8 90.2  PLT 162 169 149*    Cardiac Enzymes:  Recent Labs Lab 11/06/16 0635 11/08/16 0733  TROPONINI <0.03 <0.03    BNP: No results for input(s): PROBNP in the last 8760 hours.  Coagulation:  Recent  Labs Lab 11/08/16 0733  INR 1.33    ECG:   Medications:   Scheduled Medications: . Chlorhexidine Gluconate Cloth  6 each Topical Daily  . darbepoetin (ARANESP) injection - DIALYSIS  60 mcg Intravenous Q Sat-HD  . fenofibrate  160 mg Oral Daily  . gabapentin  400 mg Oral QHS  . hydrocortisone  10 mg Oral Daily  . hydrocortisone  5 mg Oral QHS  . levothyroxine  112 mcg Oral QAC breakfast  . loratadine  10 mg Oral Daily  . mouth rinse  15 mL Mouth Rinse BID  . mupirocin ointment  1 application Nasal BID  . nicotine  21 mg Transdermal Daily  . pantoprazole  40 mg Oral Daily  . polyethylene glycol  17 g Oral Daily  . pravastatin  40 mg Oral q1800  . risperiDONE  2 mg Oral QHS  .  risperiDONE  4 mg Oral Daily  . sertraline  50 mg Oral Daily  . sevelamer carbonate  2,400 mg Oral BID WC  . sodium chloride flush  10-40 mL Intracatheter Q12H  . topiramate  250 mg Oral BID  . traZODone  100 mg Oral QHS     Infusions: . sodium chloride 10 mL/hr at 11/08/16 1350  . norepinephrine (LEVOPHED) Adult infusion Stopped (11/11/16 1030)     PRN Medications:  sodium chloride, sodium chloride, sodium chloride, acetaminophen, albuterol, alteplase, heparin, lidocaine (PF), lidocaine-prilocaine, ondansetron (ZOFRAN) IV, oxyCODONE, pentafluoroprop-tetrafluoroeth, sodium chloride flush     Assessment/Plan    1.Pericardial effusion with tamponade - drain has been removed. No evidence of infectious etiology based on lab results, TB tests still pending as well as some viral serologies. Appreciate ID following.Possibly effusion related to ESRD and missed HD - echo Jan 10 pericardial effusion has resolved. Plan to repeat tomorrow. - transient pressor requirement, bp's soft at times but stable.   2. ESRD - plan for HD to day per reanl  3. Panhypopit - continue current meds   F/u repeat echo and pericardial fluid updated studies tomorrow. Possible d/c tomorrow.     Erin [name],  M.D., F.A.C.C.Patient ID: Erin [name], female   DOB: September 06, 1971, 46 y.o.   MRN: [id number]

## 2016-11-12 NOTE — Progress Notes (Signed)
Patient ID: Erin [name], female   DOB: 1971/07/28, 46 y.o.   MRN: [id number]  [facility] Progress Note   Assessment/ Plan:   1. Pericardial effusion/tamponade: Improved hemodynamic parameters status post pericardiocentesis- successfully weaned off pressors yesterday and transfer to telemetry unit. Repeat Echocardiogram planned for tomorrow with possible discharge thereafter if resolving.  2. End-stage renal disease: usual outpatient Tuesday/Thursday/Saturday schedule.She is on the schedule for hemodialysis today per outpatient schedule -counseled her at length about compliance (discussed the risk of uremic pericarditis/pleural effusions and ascites with suboptimal clearance from missed/shortened dialysis treatments).  3. Hyponatremia: Likely secondary to current volume status/free water intake between dialysis treatments. Monitor with hemodialysis. Advised on fluid restriction <1.5 L/day  4. Hypoalbuminemia: Discussed optimal compliance with hemodialysis and continued nutritional supplementation. On a regular diet. 5. Anemia of chronic kidney disease:  decreased hemoglobin noted-likely associated with pericardiocentesis/critical illness, scheduled to get Aranesp with hemodialysis today. 6. Metabolic bone disease:  phosphorus levels are acceptable, continue to monitor  Subjective:   No acute events overnight- Weaned off pressors and transferred to a telemetry unit overnight    Objective:   BP (!) 92/57 (BP Location: Left Arm)   Pulse 66   Temp 97.9 F (36.6 C) (Oral)   Resp 20   Ht 4\' 10"  (1.473 m)   Wt 63.5 kg (140 lb)   LMP 11/21/2007 (Approximate) Comment: No period since 2009,due to"DVT" per pt since they stopped BCP...no periods  SpO2 100%   BMI 29.26 kg/m   Physical Exam: TOI:[id number] resting in bed  JAS:NKNLZ regular rhythm, normal rate, S1 and S2 normal  Resp:Clear to auscultation, no rales  JQB:HALP, obese, nontender  Ext:No ankle edema   Labs: BMET  Recent Labs Lab 11/06/16 0635 11/08/16 0733 11/08/16 1823 11/09/16 0410 11/10/16 0458 11/11/16 0415  NA 134* 133*  --  132* 131* 133*  K 5.6* 4.2  --  5.6* 4.1 3.5  CL 100* 102  --  106 97* 96*  CO2 22 18*  --  17* 23 28  GLUCOSE 53* 107* 123* 145* 104* 103*  BUN 46* 54*  --  44* 23* 11  CREATININE 6.50* 8.67*  --  7.84* 5.00* 3.33*  CALCIUM 8.7* 7.5*  --  7.7* 7.6* 7.7*  PHOS  --   --  5.1* 4.5 5.3* 3.7   CBC  Recent Labs Lab 11/06/16 0635 11/08/16 0733 11/08/16 1823 11/09/16 0410 11/10/16 0458 11/11/16 0405  WBC 6.0 11.6* 22.2* 25.7* 25.1* 15.9*  NEUTROABS 3.0 9.5* 21.6*  --   --   --   HGB 12.5 11.5* 10.6* 10.7* 9.3* 8.6*  HCT 37.8 35.5 32.5* 32.2* 27.7* 25.8*  MCV 93.5 94.6 93.4 92.8 90.8 90.2  PLT 166 174 152 162 169 149*   Medications:    . Chlorhexidine Gluconate Cloth  6 each Topical Daily  . darbepoetin (ARANESP) injection - DIALYSIS  60 mcg Intravenous Q Sat-HD  . fenofibrate  160 mg Oral Daily  . gabapentin  400 mg Oral QHS  . hydrocortisone  10 mg Oral Daily  . hydrocortisone  5 mg Oral QHS  . levothyroxine  112 mcg Oral QAC breakfast  . loratadine  10 mg Oral Daily  . mouth rinse  15 mL Mouth Rinse BID  . mupirocin ointment  1 application Nasal BID  . nicotine  21 mg Transdermal Daily  . pantoprazole  40 mg Oral Daily  . polyethylene glycol  17 g Oral Daily  . pravastatin  40 mg  Oral q1800  . risperiDONE  2 mg Oral QHS  . risperiDONE  4 mg Oral Daily  . sertraline  50 mg Oral Daily  . sevelamer carbonate  2,400 mg Oral BID WC  . sodium chloride flush  10-40 mL Intracatheter Q12H  . topiramate  250 mg Oral BID  . traZODone  100 mg Oral QHS   Zetta Bills, MD 11/12/2016, 11:41 AM

## 2016-11-12 NOTE — Progress Notes (Signed)
Pt has been complaining of pressure pain in her chest at a level of 8 out of 10. Oxycodone 5 mg PO was already given at 18:32 and couldn't be given again until 00:32. BP and heart rate were stable and an EKG was done showing normal sinus rhythm. The on-call cardiologist was informed, did not want to give any extra oxycodone, and said to monitor and try to keep pt comfortable. However, about an hour later, the pt said her pain was at a level 9 and wanted something for pain. The MD was called a second time and said he would come down to speak to the pt.  Will continue to monitor.  Harriet Masson, RN

## 2016-11-13 ENCOUNTER — Inpatient Hospital Stay (HOSPITAL_COMMUNITY): Payer: Medicare Other

## 2016-11-13 DIAGNOSIS — I313 Pericardial effusion (noninflammatory): Secondary | ICD-10-CM

## 2016-11-13 LAB — CBC
HEMATOCRIT: 26.2 % — AB (ref 36.0–46.0)
Hemoglobin: 8.4 g/dL — ABNORMAL LOW (ref 12.0–15.0)
MCH: 29.9 pg (ref 26.0–34.0)
MCHC: 32.1 g/dL (ref 30.0–36.0)
MCV: 93.2 fL (ref 78.0–100.0)
Platelets: 136 10*3/uL — ABNORMAL LOW (ref 150–400)
RBC: 2.81 MIL/uL — AB (ref 3.87–5.11)
RDW: 18.1 % — ABNORMAL HIGH (ref 11.5–15.5)
WBC: 5.2 10*3/uL (ref 4.0–10.5)

## 2016-11-13 LAB — CULTURE, BODY FLUID-BOTTLE: CULTURE: NO GROWTH

## 2016-11-13 LAB — CULTURE, BLOOD (ROUTINE X 2)
CULTURE: NO GROWTH
Culture: NO GROWTH

## 2016-11-13 LAB — QUANTIFERON IN TUBE
QUANTIFERON MITOGEN VALUE: 0.06 [IU]/mL
QUANTIFERON NIL VALUE: 0.03 [IU]/mL
QUANTIFERON TB AG VALUE: 0.02 [IU]/mL
QUANTIFERON TB GOLD: UNDETERMINED

## 2016-11-13 LAB — BASIC METABOLIC PANEL
Anion gap: 7 (ref 5–15)
BUN: 14 mg/dL (ref 6–20)
CHLORIDE: 98 mmol/L — AB (ref 101–111)
CO2: 30 mmol/L (ref 22–32)
CREATININE: 2.67 mg/dL — AB (ref 0.44–1.00)
Calcium: 7.3 mg/dL — ABNORMAL LOW (ref 8.9–10.3)
GFR calc non Af Amer: 21 mL/min — ABNORMAL LOW (ref >60)
GFR, EST AFRICAN AMERICAN: 24 mL/min — AB (ref >60)
Glucose, Bld: 77 mg/dL (ref 65–99)
POTASSIUM: 4.2 mmol/L (ref 3.5–5.1)
Sodium: 135 mmol/L (ref 135–145)

## 2016-11-13 LAB — ECHOCARDIOGRAM COMPLETE
Height: 58 in
WEIGHTICAEL: 2292.78 [oz_av]

## 2016-11-13 LAB — CULTURE, BODY FLUID W GRAM STAIN -BOTTLE

## 2016-11-13 LAB — QUANTIFERON TB GOLD ASSAY (BLOOD)

## 2016-11-13 NOTE — Progress Notes (Signed)
Tried multiple times to assist the patient to the chair but she refused stating she was too tired or she would do it later.

## 2016-11-13 NOTE — Progress Notes (Signed)
Primary cardiologist:  Subjective:    Some pleuritic chest pain this AM  Objective:   Temp:  [97.7 F (36.5 C)-99 F (37.2 C)] 99 F (37.2 C) (01/14 0551) Pulse Rate:  [63-77] 67 (01/14 0551) Resp:  [9-21] 18 (01/14 0551) BP: (86-117)/(53-81) 107/58 (01/14 0551) SpO2:  [100 %] 100 % (01/14 0551) Weight:  [143 lb 4.8 oz (65 kg)-149 lb 4 oz (67.7 kg)] 143 lb 4.8 oz (65 kg) (01/14 0551) Last BM Date: 11/07/16  Filed Weights   11/12/16 1415 11/12/16 1727 11/13/16 0551  Weight: 149 lb 4 oz (67.7 kg) 143 lb 4.8 oz (65 kg) 143 lb 4.8 oz (65 kg)    Intake/Output Summary (Last 24 hours) at 11/13/16 0956 Last data filed at 11/12/16 1727  Gross per 24 hour  Intake               30 ml  Output             1300 ml  Net            -1270 ml    Telemetry:SR  Exam:  Gen: NAD   HEENT: sclera clear, throat clear  Resp: CTAB  Cardiac: RRR, no m/r/g, no jvd  GI: abdomen soft, NT, ND  MSK: no LE edema  Neuro: no focal deficits  Psych: appropriate affect  Lab Results:  Basic Metabolic Panel:  Recent Labs Lab 11/09/16 0410  11/11/16 0415 11/12/16 1111 11/13/16 0521  NA 132*  < > 133* 129* 135  K 5.6*  < > 3.5 4.2 4.2  CL 106  < > 96* 94* 98*  CO2 17*  < > 28 27 30   GLUCOSE 145*  < > 103* 84 77  BUN 44*  < > 11 28* 14  CREATININE 7.84*  < > 3.33* 4.63* 2.67*  CALCIUM 7.7*  < > 7.7* 7.4* 7.3*  MG 1.5*  --   --  1.7  --   < > = values in this interval not displayed.  Liver Function Tests:  Recent Labs Lab 11/08/16 0733 11/09/16 0410 11/10/16 0458 11/11/16 0415  AST 27  --   --   --   ALT <5*  --   --   --   ALKPHOS 125  --   --   --   BILITOT 0.5  --   --   --   PROT 5.6*  --   --   --   ALBUMIN 2.9* 2.5* 2.4* 2.4*    CBC:  Recent Labs Lab 11/11/16 0405 11/12/16 1111 11/13/16 0521  WBC 15.9* 7.3 5.2  HGB 8.6* 8.1* 8.4*  HCT 25.8* 24.2* 26.2*  MCV 90.2 90.6 93.2  PLT 149* 149* 136*    Cardiac Enzymes:  Recent Labs Lab 11/08/16 0733    TROPONINI <0.03    BNP: No results for input(s): PROBNP in the last 8760 hours.  Coagulation:  Recent Labs Lab 11/08/16 0733  INR 1.33    ECG:   Medications:   Scheduled Medications: . Chlorhexidine Gluconate Cloth  6 each Topical Daily  . darbepoetin (ARANESP) injection - DIALYSIS  60 mcg Intravenous Q Sat-HD  . fenofibrate  160 mg Oral Daily  . gabapentin  400 mg Oral QHS  . hydrocortisone  10 mg Oral Daily  . hydrocortisone  5 mg Oral QHS  . levothyroxine  112 mcg Oral QAC breakfast  . loratadine  10 mg Oral Daily  . mouth rinse  15 mL Mouth  Rinse BID  . mupirocin ointment  1 application Nasal BID  . nicotine  21 mg Transdermal Daily  . pantoprazole  40 mg Oral Daily  . polyethylene glycol  17 g Oral Daily  . pravastatin  40 mg Oral q1800  . risperiDONE  2 mg Oral QHS  . risperiDONE  4 mg Oral Daily  . sertraline  50 mg Oral Daily  . sevelamer carbonate  2,400 mg Oral BID WC  . sodium chloride flush  10-40 mL Intracatheter Q12H  . topiramate  250 mg Oral BID  . traZODone  100 mg Oral QHS     Infusions: . sodium chloride 10 mL/hr at 11/08/16 1350  . norepinephrine (LEVOPHED) Adult infusion Stopped (11/11/16 1030)     PRN Medications:  sodium chloride, sodium chloride, sodium chloride, acetaminophen, albuterol, alteplase, heparin, lidocaine (PF), lidocaine-prilocaine, ondansetron (ZOFRAN) IV, oxyCODONE, pentafluoroprop-tetrafluoroeth, sodium chloride flush     Assessment/Plan    1.Pericardial effusion with tamponade - drain has been removed. No evidence of infectious etiology based on lab results, TB tests still pending though AFB smear was negative, some viral serologies pending. Negative ANA and RF factor. Negative cytology Appreciate ID following.Possibly effusion related to ESRD and missed HD - echo Jan 10 pericardial effusion has resolved. Plan to repeat echo today.  - transient pressor requirement, bp's soft at times but stable.  - some pleuritic  chest pain this AM. She has made several request for IV dilaudid this admission. She is drowsy but arouseable, she received her oral oxycodone this AM, would not redose opiates at this time.    2. ESRD - s/p HD yesterday  3. Panhypopit - continue current meds  4. Deconditioning - patient shake and weak on her feet, she is uncomfortable going home. We will consult PT     F/u repeat echo and pericardial fluid updated studies tomorrow. Possible d/c tomorrow.        Erin [name], M.D., F.A.C.C.Patient ID: Erin [name], female   DOB: 04-24-1971, 46 y.o.   MRN: [id number]

## 2016-11-13 NOTE — Progress Notes (Signed)
Patient ID: Erin [name], female   DOB: 1971-08-25, 46 y.o.   MRN: [id number]  [facility] Progress Note   Assessment/ Plan:   1. Pericardial effusion/tamponade: Improved hemodynamic parameters status post pericardiocentesis- Improved hemodynamic status at this time and weaned off pressors successfully. Plans for repeat Echocardiogram noted for tomorrow with possible discharge thereafter if resolving.  2. End-stage renal disease: usual outpatient Tuesday/Thursday/Saturday schedule. underwent hemodialysis yesterday and planned for next hemodialysis on Tuesday -counseled her at length about compliance (discussed the risk of uremic pericarditis/pleural effusions and ascites with suboptimal clearance from missed/shortened dialysis treatments).  3. Hyponatremia: Likely secondary to current volume status/free water intake between dialysis treatments. Monitor with hemodialysis. Advised on fluid restriction <1.5 L/day  4. Hypoalbuminemia: Discussed optimal compliance with hemodialysis and continued nutritional supplementation. On a regular diet. 5. Anemia of chronic kidney disease:  decreased hemoglobin noted-likely associated with pericardiocentesis/critical illness, scheduled to get Aranesp with hemodialysis today. 6. Metabolic bone disease:  phosphorus levels are acceptable, continue to monitor  Subjective:   Reports some pleuritic type chest pain earlier today    Objective:   BP (!) 107/58 (BP Location: Left Wrist)   Pulse 67   Temp 99 F (37.2 C) (Oral)   Resp 18   Ht 4\' 10"  (1.473 m)   Wt 65 kg (143 lb 4.8 oz)   LMP 11/21/2007 (Approximate) Comment: No period since 2009,due to"DVT" per pt since they stopped BCP...no periods  SpO2 100%   BMI 29.95 kg/m   Physical Exam: Gen: Somnolent when seen, just got some oxycodone earlier HKV:QQVZD regular rhythm, normal rate, S1 and S2 normal  Resp:Clear to auscultation, no rales  GLO:VFIE, obese, nontender  Ext:No ankle edema,   right upper arm AVG with thrill and bruit  Labs: BMET  Recent Labs Lab 11/08/16 0733 11/08/16 1823 11/09/16 0410 11/10/16 0458 11/11/16 0415 11/12/16 1111 11/13/16 0521  NA 133*  --  132* 131* 133* 129* 135  K 4.2  --  5.6* 4.1 3.5 4.2 4.2  CL 102  --  106 97* 96* 94* 98*  CO2 18*  --  17* 23 28 27 30   GLUCOSE 107* 123* 145* 104* 103* 84 77  BUN 54*  --  44* 23* 11 28* 14  CREATININE 8.67*  --  7.84* 5.00* 3.33* 4.63* 2.67*  CALCIUM 7.5*  --  7.7* 7.6* 7.7* 7.4* 7.3*  PHOS  --  5.1* 4.5 5.3* 3.7  --   --    CBC  Recent Labs Lab 11/08/16 0733 11/08/16 1823  11/10/16 0458 11/11/16 0405 11/12/16 1111 11/13/16 0521  WBC 11.6* 22.2*  < > 25.1* 15.9* 7.3 5.2  NEUTROABS 9.5* 21.6*  --   --   --   --   --   HGB 11.5* 10.6*  < > 9.3* 8.6* 8.1* 8.4*  HCT 35.5 32.5*  < > 27.7* 25.8* 24.2* 26.2*  MCV 94.6 93.4  < > 90.8 90.2 90.6 93.2  PLT 174 152  < > 169 149* 149* 136*  < > = values in this interval not displayed. Medications:    . Chlorhexidine Gluconate Cloth  6 each Topical Daily  . darbepoetin (ARANESP) injection - DIALYSIS  60 mcg Intravenous Q Sat-HD  . fenofibrate  160 mg Oral Daily  . gabapentin  400 mg Oral QHS  . hydrocortisone  10 mg Oral Daily  . hydrocortisone  5 mg Oral QHS  . levothyroxine  112 mcg Oral QAC breakfast  . loratadine  10 mg Oral Daily  . mouth rinse  15 mL Mouth Rinse BID  . mupirocin ointment  1 application Nasal BID  . nicotine  21 mg Transdermal Daily  . pantoprazole  40 mg Oral Daily  . polyethylene glycol  17 g Oral Daily  . pravastatin  40 mg Oral q1800  . risperiDONE  2 mg Oral QHS  . risperiDONE  4 mg Oral Daily  . sertraline  50 mg Oral Daily  . sevelamer carbonate  2,400 mg Oral BID WC  . sodium chloride flush  10-40 mL Intracatheter Q12H  . topiramate  250 mg Oral BID  . traZODone  100 mg Oral QHS   Zetta Bills, MD 11/13/2016, 12:51 PM

## 2016-11-14 DIAGNOSIS — Z8673 Personal history of transient ischemic attack (TIA), and cerebral infarction without residual deficits: Secondary | ICD-10-CM

## 2016-11-14 DIAGNOSIS — Z992 Dependence on renal dialysis: Secondary | ICD-10-CM

## 2016-11-14 LAB — BASIC METABOLIC PANEL
Anion gap: 7 (ref 5–15)
BUN: 24 mg/dL — AB (ref 6–20)
CO2: 29 mmol/L (ref 22–32)
CREATININE: 3.93 mg/dL — AB (ref 0.44–1.00)
Calcium: 7.3 mg/dL — ABNORMAL LOW (ref 8.9–10.3)
Chloride: 95 mmol/L — ABNORMAL LOW (ref 101–111)
GFR calc Af Amer: 15 mL/min — ABNORMAL LOW (ref >60)
GFR, EST NON AFRICAN AMERICAN: 13 mL/min — AB (ref >60)
GLUCOSE: 63 mg/dL — AB (ref 65–99)
POTASSIUM: 5.1 mmol/L (ref 3.5–5.1)
SODIUM: 131 mmol/L — AB (ref 135–145)

## 2016-11-14 LAB — MTB NAA WITHOUT AFB CULTURE: SOURCE: NEGATIVE

## 2016-11-14 LAB — CBC
HCT: 26.8 % — ABNORMAL LOW (ref 36.0–46.0)
Hemoglobin: 8.5 g/dL — ABNORMAL LOW (ref 12.0–15.0)
MCH: 29.8 pg (ref 26.0–34.0)
MCHC: 31.7 g/dL (ref 30.0–36.0)
MCV: 94 fL (ref 78.0–100.0)
PLATELETS: 168 10*3/uL (ref 150–400)
RBC: 2.85 MIL/uL — ABNORMAL LOW (ref 3.87–5.11)
RDW: 18.4 % — AB (ref 11.5–15.5)
WBC: 5.4 10*3/uL (ref 4.0–10.5)

## 2016-11-14 LAB — MISC LABCORP TEST (SEND OUT)
LABCORP TEST CODE: 9985
Labcorp test code: 9985

## 2016-11-14 MED ORDER — HYDROCORTISONE 10 MG PO TABS: 5.0000 mg | ORAL_TABLET | Freq: Two times a day (BID) | ORAL | 3 refills | Status: AC

## 2016-11-14 NOTE — Progress Notes (Addendum)
PT Cancellation Note  Patient Details Name: Erin [name] MRN: [id number] DOB: 1971-05-22   Cancelled Treatment:    Reason Eval/Treat Not Completed: Medical issues which prohibited therapy (pt getting fem line removed and on Bedrest x 30 min. Will attempt to see after Bedrest)   [name] B [name] 11/14/2016, 11:42 AM [name], PT [phone]

## 2016-11-14 NOTE — Progress Notes (Signed)
Patient ID: Erin [name], female   DOB: 04-30-1971, 46 y.o.   MRN: [id number]  [facility] Progress Note   Assessment/ Plan:   1. Pericardial effusion/tamponade: Improved hemodynamic parameters status post pericardiocentesis- s/p repeat TTE with trivial pericardial effusion.  Going to go home on steroid taper.  Would not use colchicine in setting of ESRD.   2. End-stage renal disease: usual outpatient Tuesday/Thursday/Saturday schedule. underwent hemodialysis yesterday and planned for next hemodialysis on Tuesday -counseled her at length about compliance (discussed the risk of uremic pericarditis/pleural effusions and ascites with suboptimal clearance from missed/shortened dialysis treatments). Pt again reiterated that she would adhere better 3. Hyponatremia: Likely secondary to current volume status/free water intake between dialysis treatments. Monitor with hemodialysis. Advised on fluid restriction <1.5 L/day  4. Hypoalbuminemia: Discussed optimal compliance with hemodialysis and continued nutritional supplementation. On a regular diet. 5. Anemia of chronic kidney disease:  decreased hemoglobin noted-likely associated with pericardiocentesis/critical illness, Aranesp given Saturday 6. Metabolic bone disease:  phosphorus levels are acceptable, continue to monitor  Subjective:   NAEON   Objective:   BP 106/63 (BP Location: Left Wrist)   Pulse 61   Temp 98.1 F (36.7 C) (Oral)   Resp 16   Ht 4\' 10"  (1.473 m)   Wt 68.3 kg (150 lb 8 oz)   LMP 11/21/2007 (Approximate) Comment: No period since 2009,due to"DVT" per pt since they stopped BCP...no periods  SpO2 97%   BMI 31.45 kg/m   Physical Exam: Gen: Somnolent when seen, just got some oxycodone earlier QHK:UVJDY regular rhythm, normal rate, S1 and S2 normal  Resp:Clear to auscultation, no rales  NXG:ZFPO, obese, nontender  Ext:No ankle edema,  right upper arm AVG with thrill and bruit  Labs: BMET  Recent Labs Lab  11/08/16 0733 11/08/16 1823 11/09/16 0410 11/10/16 0458 11/11/16 0415 11/12/16 1111 11/13/16 0521 11/14/16 0357  NA 133*  --  132* 131* 133* 129* 135 131*  K 4.2  --  5.6* 4.1 3.5 4.2 4.2 5.1  CL 102  --  106 97* 96* 94* 98* 95*  CO2 18*  --  17* 23 28 27 30 29   GLUCOSE 107* 123* 145* 104* 103* 84 77 63*  BUN 54*  --  44* 23* 11 28* 14 24*  CREATININE 8.67*  --  7.84* 5.00* 3.33* 4.63* 2.67* 3.93*  CALCIUM 7.5*  --  7.7* 7.6* 7.7* 7.4* 7.3* 7.3*  PHOS  --  5.1* 4.5 5.3* 3.7  --   --   --    CBC  Recent Labs Lab 11/08/16 0733 11/08/16 1823  11/11/16 0405 11/12/16 1111 11/13/16 0521 11/14/16 0357  WBC 11.6* 22.2*  < > 15.9* 7.3 5.2 5.4  NEUTROABS 9.5* 21.6*  --   --   --   --   --   HGB 11.5* 10.6*  < > 8.6* 8.1* 8.4* 8.5*  HCT 35.5 32.5*  < > 25.8* 24.2* 26.2* 26.8*  MCV 94.6 93.4  < > 90.2 90.6 93.2 94.0  PLT 174 152  < > 149* 149* 136* 168  < > = values in this interval not displayed. Medications:    . Chlorhexidine Gluconate Cloth  6 each Topical Daily  . darbepoetin (ARANESP) injection - DIALYSIS  60 mcg Intravenous Q Sat-HD  . fenofibrate  160 mg Oral Daily  . gabapentin  400 mg Oral QHS  . hydrocortisone  10 mg Oral Daily  . hydrocortisone  5 mg Oral QHS  . levothyroxine  112  mcg Oral QAC breakfast  . loratadine  10 mg Oral Daily  . mouth rinse  15 mL Mouth Rinse BID  . mupirocin ointment  1 application Nasal BID  . nicotine  21 mg Transdermal Daily  . pantoprazole  40 mg Oral Daily  . polyethylene glycol  17 g Oral Daily  . pravastatin  40 mg Oral q1800  . risperiDONE  2 mg Oral QHS  . risperiDONE  4 mg Oral Daily  . sertraline  50 mg Oral Daily  . sevelamer carbonate  2,400 mg Oral BID WC  . sodium chloride flush  10-40 mL Intracatheter Q12H  . topiramate  250 mg Oral BID  . traZODone  100 mg Oral QHS   Bufford Buttner, MD 11/14/2016, 12:36 PM

## 2016-11-14 NOTE — Care Management Note (Signed)
Case Management Note Donn Pierini RN, BSN Unit 2W-Case Manager [phone]  Patient Details  Name: Erin [name] MRN: [id number] Date of Birth: April 16, 1971  Subjective/Objective:   Pt admitted with cardiac/pericardial tamponade-pericardial effusion -tx from 4N to 2W on 11/11/16                 Action/Plan: PTA pt lived at home alone- hx legally blind, ESRD- with HD-T/T/S-  Per CM Handoff - [employer]- assists with d/c planning needs trough Medicaid- contact nameDonnelly Angelica- [phone]  Expected Discharge Date:  11/14/16               Expected Discharge Plan:  Home w Home Health Services  In-House Referral:     Discharge planning Services  CM Consult  Post Acute Care Choice:    Choice offered to:     DME Arranged:    DME Agency:     HH Arranged:    HH Agency:     Status of Service:  Completed, signed off  If discussed at [employer] of Stay Meetings, dates discussed:    Additional Comments:  11/14/16- 1120- Roquel Burgin RN, CM- pt for d/c home today- PT eval ordered however pending- CM will check for recommendation if completed prior to discharge. Doubt pt would qualify for [facility] therapies as she does not have a qualifying dx at this time.   Darrold Span, RN 11/14/2016, 11:20 AM

## 2016-11-14 NOTE — Progress Notes (Signed)
Patient Name: Erin [name] Date of Encounter: 11/14/2016  Primary Cardiologist: Dr. Welton Flakes (Seen by Dr. Excell Seltzer)  [facility] Problem List     Principal Problem:   Cardiac/pericardial tamponade Active Problems:   Anemia   Bipolar 1 disorder, mixed, moderate (HCC)   Hypothyroidism   End stage renal disease on dialysis [facility])   Seizure disorder (HCC)   Blindness   History of CVA (cerebrovascular accident)   Shock circulatory (HCC)     Subjective   9/10 chest pain that only narcotics help.   Inpatient Medications    Scheduled Meds: . Chlorhexidine Gluconate Cloth  6 each Topical Daily  . darbepoetin (ARANESP) injection - DIALYSIS  60 mcg Intravenous Q Sat-HD  . fenofibrate  160 mg Oral Daily  . gabapentin  400 mg Oral QHS  . hydrocortisone  10 mg Oral Daily  . hydrocortisone  5 mg Oral QHS  . levothyroxine  112 mcg Oral QAC breakfast  . loratadine  10 mg Oral Daily  . mouth rinse  15 mL Mouth Rinse BID  . mupirocin ointment  1 application Nasal BID  . nicotine  21 mg Transdermal Daily  . pantoprazole  40 mg Oral Daily  . polyethylene glycol  17 g Oral Daily  . pravastatin  40 mg Oral q1800  . risperiDONE  2 mg Oral QHS  . risperiDONE  4 mg Oral Daily  . sertraline  50 mg Oral Daily  . sevelamer carbonate  2,400 mg Oral BID WC  . sodium chloride flush  10-40 mL Intracatheter Q12H  . topiramate  250 mg Oral BID  . traZODone  100 mg Oral QHS   Continuous Infusions: . sodium chloride 10 mL/hr at 11/08/16 1350  . norepinephrine (LEVOPHED) Adult infusion Stopped (11/11/16 1030)   PRN Meds: sodium chloride, sodium chloride, sodium chloride, acetaminophen, albuterol, alteplase, heparin, lidocaine (PF), lidocaine-prilocaine, ondansetron (ZOFRAN) IV, oxyCODONE, pentafluoroprop-tetrafluoroeth, sodium chloride flush   Vital Signs    Vitals:   11/13/16 1343 11/13/16 1820 11/13/16 2042 11/14/16 0613  BP: (!) 93/51 123/68 (!) 104/57 106/63  Pulse: 65 70 71 61  Resp: 18   16 16   Temp: 98.1 F (36.7 C)  97.6 F (36.4 C) 98.1 F (36.7 C)  TempSrc: Oral  Oral Oral  SpO2: 97%  100% 97%  Weight:    150 lb 8 oz (68.3 kg)  Height:        Intake/Output Summary (Last 24 hours) at 11/14/16 0924 Last data filed at 11/14/16 0820  Gross per 24 hour  Intake                0 ml  Output              575 ml  Net             -575 ml   Filed Weights   11/12/16 1727 11/13/16 0551 11/14/16 0613  Weight: 143 lb 4.8 oz (65 kg) 143 lb 4.8 oz (65 kg) 150 lb 8 oz (68.3 kg)    Physical Exam    GEN: Well nourished, well developed, in no acute distress. Obese, blind. HEENT: Grossly normal.  Neck: Supple, no JVD, carotid bruits, or masses. Cardiac: RRR, no murmurs, rubs, or gallops. No clubbing, cyanosis, edema.  Radials/DP/PT 2+ and equal bilaterally.  Respiratory:  Respirations regular and unlabored, clear to auscultation bilaterally. GI: Soft, nontender, nondistended, BS + x 4. MS: no deformity or atrophy. Skin: warm and dry, no rash. Neuro:  Strength  and sensation are intact. Psych: AAOx3.  Normal affect.  Labs    CBC  Recent Labs  11/13/16 0521 11/14/16 0357  WBC 5.2 5.4  HGB 8.4* 8.5*  HCT 26.2* 26.8*  MCV 93.2 94.0  PLT 136* 168   Basic Metabolic Panel  Recent Labs  11/12/16 1111 11/13/16 0521 11/14/16 0357  NA 129* 135 131*  K 4.2 4.2 5.1  CL 94* 98* 95*  CO2 27 30 29   GLUCOSE 84 77 63*  BUN 28* 14 24*  CREATININE 4.63* 2.67* 3.93*  CALCIUM 7.4* 7.3* 7.3*  MG 1.7  --   --    Liver Function Tests No results for input(s): AST, ALT, ALKPHOS, BILITOT, PROT, ALBUMIN in the last 72 hours. No results for input(s): LIPASE, AMYLASE in the last 72 hours. Cardiac Enzymes No results for input(s): CKTOTAL, CKMB, CKMBINDEX, TROPONINI in the last 72 hours. BNP Invalid input(s): POCBNP D-Dimer No results for input(s): DDIMER in the last 72 hours. Hemoglobin A1C No results for input(s): HGBA1C in the last 72 hours. Fasting Lipid Panel No  results for input(s): CHOL, HDL, LDLCALC, TRIG, CHOLHDL, LDLDIRECT in the last 72 hours. Thyroid Function Tests No results for input(s): TSH, T4TOTAL, T3FREE, THYROIDAB in the last 72 hours.  Invalid input(s): FREET3  Telemetry    NSR - Personally Reviewed  ECG    NSR HR 66- Personally Reviewed  Radiology    No results found.  Cardiac Studies   Limited Echo: 11/08/2016 Study Conclusions - Pericardium, extracardiac: There was a large circumferential   pericardial drainage. Patient underwent drainage of effusion and   now appears to be a mild residual circumferential effusion.  Limited Echo: 11/09/2016 LV EF: 50% -   55% Study Conclusions - Left ventricle: The cavity size was normal. Systolic function was   normal. The estimated ejection fraction was in the range of 50%   to 55%. Wall motion was normal; there were no regional wall   motion abnormalities. - Aortic valve: There was mild regurgitation. - Mitral valve: There was mild regurgitation. - Tricuspid valve: There was mild regurgitation. - Pericardium, extracardiac: A trivial pericardial effusion was   identified anterior to the heart. There was a moderate-sized left   pleural effusion Impressions: - Moderate to large pericardial effusion is resolved via   pericardial drain.  2D ECHO: 11/13/2016 LV EF: 50% -   55% Study Conclusions - Left ventricle: The cavity size was normal. Wall thickness was   increased in a pattern of mild LVH. Systolic function was normal.   The estimated ejection fraction was in the range of 50% to 55%.   Wall motion was normal; there were no regional wall motion   abnormalities. The study is not technically sufficient to allow   evaluation of LV diastolic function. - Aortic valve: There was mild regurgitation. Valve area (VTI):   2.54 cm^2. Valve area (Vmax): 3.14 cm^2. - Mitral valve: There was mild regurgitation. - Left atrium: The atrium was moderately dilated. - Pericardium,  extracardiac: There is a trivial circumferential   pericaridal effusion. - Technically adequate study.   Patient Profile     46 yo female w/ hx Bipolar, ESRD on HD, tobacco abuse, CVA, hypothyroid, blindness (since childhood), chronic pain issues, CP with a recent cardiac catheterization (10/28/16) with no CAD who was admitted 01/09 from [facility] for pericardial effusion w/ tamponade.  S/p pericardial drain.  Assessment & Plan    Pericardial effusion with tamponade: she had cardiogenic shock requring pressors. Dr.  Callwood did a pericardiocentesis with 80 cc of straw-colored fluid drained and patient transferred to [facility]. She underwent repeat pericardiocentesis by Dr. Excell Seltzer on 11/08/16 with of bloody fluid. Drain placed, which has now been removed.  -- No evidence of infectious etiology based on lab results, TB tests still pending though AFB smear was negative, some viral serologies pending. Negative ANA and RF factor. Negative cytology Appreciate ID following. Possibly effusion related to ESRD and missed HD -- Repeat 2D ECHO yesterday with a trivial circumferential pericaridal effusion.  ESRD: usual outpatient Tuesday/Thursday/Saturday schedule. underwent hemodialysis yesterday and planned for next hemodialysis on Tuesday   Deconditioning: will consult PT. She does have home health services. May need HH PT.  Chronic pain: she has continued to ask for narcotics. Has "9/10" chest pain currently. Recent cath with no CAD. Would not escalate narcotics further. She appears comfortable.   Signed, Cline Crock, PA-C  11/14/2016, 9:24 AM

## 2016-11-14 NOTE — Progress Notes (Signed)
Patient ID: Erin [name], female   DOB: 08-16-1971, 46 y.o.   MRN: [id number]          [facility] for Infectious Disease    Date of Admission:  11/08/2016            All pericardial fluid microbiology studies are negative or still pending. Gram stain and routine cultures were negative. AFB stain is negative. The nucleic amplification assay for M tb was negative. Her ANA and rheumatoid factor were negative and her TSH is normal. She has several viral PCR studies pending but even if positive would not require any treatment. I will await final results and provide follow-up as needed. I will sign off now.   Cliffton Asters, MD [facility] [phone] pager   [phone] cell

## 2016-11-14 NOTE — Discharge Summary (Signed)
Discharge Summary    Patient ID: Erin [name],  MRN: [id number], DOB/AGE: 1971/03/03 46 y.o.  Admit date: 11/08/2016 Discharge date: 11/14/2016  Primary Care Provider: [facility] Primary Cardiologist: Dr. Welton Flakes    Discharge Diagnoses    Principal Problem:   Cardiac/pericardial tamponade Active Problems:   Anemia   Bipolar 1 disorder, mixed, moderate (HCC)   Hypothyroidism   End stage renal disease on dialysis [facility])   Seizure disorder (HCC)   Blindness   History of CVA (cerebrovascular accident)   Shock circulatory (HCC)   Allergies Allergies  Allergen Reactions  . Dhea [Nutritional Supplements] Anaphylaxis    Patient states medication is DHE for migraines, not DHEA  . Demerol [Meperidine] Other (See Comments)    Reaction:  Hallucinations   . Floxin [Ofloxacin] Other (See Comments)    Reaction:  Hallucinations   . Nsaids Nausea And Vomiting and Swelling  . Nubain [Nalbuphine Hcl] Other (See Comments)    Reaction:  Hallucinations   . Phenergan [Promethazine Hcl] Other (See Comments)    Reaction:  Restless legs   . Stadol [Butorphanol] Other (See Comments)    Reaction:  Hallucinations   . Erythromycin Diarrhea and Rash     History of Present Illness     46 yo female w/ hx Bipolar, ESRD on HD, tobacco abuse, CVA, hypothyroid, blindness (since childhood), chronic pain issues, CP with a recent cardiac catheterization (10/28/16) with no CAD who was admitted 01/09 from [facility] for pericardial effusion w/ tamponade.    She presented to [facility] hospital with chest pain and SOB. She was found to be hypotensive in the ER with pressures in the 60's.  Chest CT was evaluated and was negative for PE but showed a moderate size pericardial effusion, and left pleural effusion.  CXR on 1/9 showed bilateral pulmonary interstitial edema and cardiomegaly.  ECHO was performed which confirmed pericardial effusion with concern for RV collapse.  Per chart review, Dr. Jule Economy  performed a bedside pericardiocentesis which removed 80 cc straw colored fluid without a change in BP but with decreased RV collapse. She was subsequently transferred to [facility] for further evaluation.   Hospital Course     Consultants: PCCM, ID, Nehprology  Pericardial effusion with tamponade: she had cardiogenic shock requring pressors. Dr. Juliann Pares did a pericardiocentesiswith 80 cc of straw-colored fluid drained and patient transferred to [facility]. She underwent repeat pericardiocentesis by Dr. Excell Seltzer on 11/08/16 with of bloody fluid. Drain placed, which has now been removed.  -- No evidence of infectious etiology based on lab results, TB tests still pending though AFB smear was negative, some viral serologies pending. Negative ANA and RF factor. Negative cytologyAppreciate ID following. Possibly effusion related to ESRD and missed HD -- Repeat 2D ECHO yesterday with a trivial circumferential pericaridal effusion.  ESRD: usual outpatient Tuesday/Thursday/Saturday schedule. Underwent hemodialysis yesterday and planned for next hemodialysis on Tuesday  Chronic pain: she has continued to ask for narcotics. Has "9/10" chest pain currently. Recent cath with no CAD. Reports intolerance to NSAIDs which cause "joint swelling". Colchicine is contraindicated due to ESRD. She is on hydrocortisone for panhypopituitarism already - would increase dose to 15 mg BID for 2 weeks, then decrease to 10 mg BID for weeks, then further decrease back to 10 mg QAM and 5 mg QPM (current dose) thereafter.  Panhypopituitarism: continue chronic steroids, see above  Tobacco abuse: counseled on cessation.   The patient has had a complicated hospital course but is recovering well.  She has been seen by Dr. Rennis Golden today and deemed ready for discharge home. She will follow with her primary cardiologist in [place], Dr. Welton Flakes. Smoking cessation was disscussed in length. Discharge medications are listed  below.  _____________  Discharge Vitals Blood pressure 106/63, pulse 61, temperature 98.1 F (36.7 C), temperature source Oral, resp. rate 16, height 4\' 10"  (1.473 m), weight 150 lb 8 oz (68.3 kg), last menstrual period 11/21/2007, SpO2 97 %.  Filed Weights   11/12/16 1727 11/13/16 0551 11/14/16 0613  Weight: 143 lb 4.8 oz (65 kg) 143 lb 4.8 oz (65 kg) 150 lb 8 oz (68.3 kg)    Labs & Radiologic Studies     CBC  Recent Labs  11/13/16 0521 11/14/16 0357  WBC 5.2 5.4  HGB 8.4* 8.5*  HCT 26.2* 26.8*  MCV 93.2 94.0  PLT 136* 168   Basic Metabolic Panel  Recent Labs  11/12/16 1111 11/13/16 0521 11/14/16 0357  NA 129* 135 131*  K 4.2 4.2 5.1  CL 94* 98* 95*  CO2 27 30 29   GLUCOSE 84 77 63*  BUN 28* 14 24*  CREATININE 4.63* 2.67* 3.93*  CALCIUM 7.4* 7.3* 7.3*  MG 1.7  --   --    Liver Function Tests No results for input(s): AST, ALT, ALKPHOS, BILITOT, PROT, ALBUMIN in the last 72 hours. No results for input(s): LIPASE, AMYLASE in the last 72 hours. Cardiac Enzymes No results for input(s): CKTOTAL, CKMB, CKMBINDEX, TROPONINI in the last 72 hours. BNP Invalid input(s): POCBNP D-Dimer No results for input(s): DDIMER in the last 72 hours. Hemoglobin A1C No results for input(s): HGBA1C in the last 72 hours. Fasting Lipid Panel No results for input(s): CHOL, HDL, LDLCALC, TRIG, CHOLHDL, LDLDIRECT in the last 72 hours. Thyroid Function Tests No results for input(s): TSH, T4TOTAL, T3FREE, THYROIDAB in the last 72 hours.  Invalid input(s): FREET3  Dg Chest 2 View  Result Date: 10/23/2016 CLINICAL DATA:  Left-sided chest pain for 15 minutes prior to arrival, no known injury, initial encounter EXAM: CHEST  2 VIEW COMPARISON:  10/15/2016 FINDINGS: The heart size and mediastinal contours are within normal limits. Both lungs are clear. The visualized skeletal structures are unremarkable. IMPRESSION: No active cardiopulmonary disease. Electronically Signed   By: Alcide Clever  M.D.   On: 10/23/2016 21:48   Dg Ribs Unilateral W/chest Right  Result Date: 10/31/2016 CLINICAL DATA:  Larey Seat and injured right chest. EXAM: RIGHT RIBS AND CHEST - 3+ VIEW COMPARISON:  Chest x-ray 10/26/2016 FINDINGS: The heart is mildly enlarged but stable. There is tortuosity of the thoracic aorta. The lungs are clear. No pleural effusion or pneumothorax. Dedicated views of the right ribs demonstrate a nondisplaced fracture of the right anterior eighth rib. Possible nondisplaced 9 rib fracture also. IMPRESSION: Nondisplaced right eighth rib fracture and possible ninth rib fracture also. Stable cardiac enlargement but no acute pulmonary findings. Electronically Signed   By: Rudie Meyer M.D.   On: 10/31/2016 17:29   Dg Lumbar Spine Complete  Result Date: 10/26/2016 CLINICAL DATA:  Lower back pain after fall last night. EXAM: LUMBAR SPINE - COMPLETE 4+ VIEW COMPARISON:  CT scan of July 04, 2016. FINDINGS: There is no evidence of lumbar spine fracture. Alignment is normal. Intervertebral disc spaces are maintained. IMPRESSION: Normal lumbar spine. Electronically Signed   By: Lupita Raider, M.D.   On: 10/26/2016 18:57   Dg Knee 2 Views Left  Result Date: 10/31/2016 CLINICAL DATA:  Pt states, "there was some  packaging tape in the floor that got stuck to my foot and I tripped which caused my fall." Pt complains of right posterior rib pain and anterior left knee pain. Pt states she has previous rib fractures EXAM: LEFT KNEE - 1-2 VIEW COMPARISON:  05/09/2016 FINDINGS: No fracture of the proximal tibia or distal femur. Patella is normal. No joint effusion. Vascular graft noted in the medial thigh IMPRESSION: No fracture or dislocation. Electronically Signed   By: Genevive Bi M.D.   On: 10/31/2016 17:29   Dg Abdomen 1 View  Result Date: 11/08/2016 CLINICAL DATA:  Femoral line placement, hypotension, chest pain, dialysis EXAM: ABDOMEN - 1 VIEW COMPARISON:  Lumbar spine films of 10/26/2016 and CT  abdomen pelvis of 07/04/2016 FINDINGS: Supine views of the abdomen show large and small bowel gas to be present with no significant distension. Linear scarring, atelectasis, or pneumonia is noted at lung bases left-greater-than-right. Surgical clips are present in the right upper quadrant from prior cholecystectomy. The right femoral venous line tip extends to the L4 level. IMPRESSION: 1. Right femoral venous catheter tip extends to L4. 2. No bowel obstruction. Electronically Signed   By: Dwyane Dee M.D.   On: 11/08/2016 08:31   Ct Chest Wo Contrast  Result Date: 10/31/2016 CLINICAL DATA:  Tripped and fell today with trauma to the right side of the back. EXAM: CT CHEST WITHOUT CONTRAST TECHNIQUE: Multidetector CT imaging of the chest was performed following the standard protocol without IV contrast. COMPARISON:  Chest radiography same day. CT 10/15/2016. CT 06/04/2016 FINDINGS: Cardiovascular: Pericardial effusion is slightly larger than was seen on the previous study. Some coronary artery calcification is noted. Some aortic atherosclerotic calcification is noted. Mediastinum/Nodes: No sign of adenopathy or mass without contrast. Previously, inferior right hilar and subcarinal nodes were noted. I do not suspect that there has been enlargement. Lungs/Pleura: No new pneumothorax or hemothorax. No pleural effusion. No active pulmonary disease. Subpleural nodule in the right middle lobe image 54 is unchanged from previous studies. Upper Abdomen: Negative Musculoskeletal: Old healed rib fractures on the right. No acute rib fracture seen. Old compression fracture at T6 with vertebra plana. No change from previous studies. IMPRESSION: No acute or traumatic finding. Old healed or healing rib fractures on the right. Old T6 compression fracture with vertebra plana. Small pericardial effusion, slightly larger than on the previous study. Electronically Signed   By: Paulina Fusi M.D.   On: 10/31/2016 18:39   Ct Angio Chest  Pe W And/or Wo Contrast  Result Date: 11/08/2016 CLINICAL DATA:  Chest pain. Pain for 2 days following dialysis. Hypotensive. EXAM: CT ANGIOGRAPHY CHEST WITH CONTRAST TECHNIQUE: Multidetector CT imaging of the chest was performed using the standard protocol during bolus administration of intravenous contrast. Multiplanar CT image reconstructions and MIPs were obtained to evaluate the vascular anatomy. CONTRAST:  75 mL Isovue COMPARISON:  CT 10/31/2016 FINDINGS: Cardiovascular: No filling defects within pulmonary artery to suggest acute pulmonary embolism. No acute findings aorta great vessels. Moderate size pleural effusion is present measuring 2 cm in thickness along the LEFT ventricle. Mediastinum/Nodes: No axillary adenopathy. LEFT supraclavicular node measures 11 mm (image 11, series 6). RIGHT lower paratracheal lymph node measures 9 mm. Prevascular lymph node measures 12 mm. Esophagus normal. Lungs/Pleura: There is para bronchovascular thickening in the inferior LEFT lower lobe and mild consolidation. Small bilateral effusions. Pneumothorax. No pulmonary mass identified. Upper Abdomen: Limited view of the liver, kidneys, pancreas are unremarkable. Normal adrenal glands. Musculoskeletal: No aggressive osseous lesion.  Severe compression fracture of the T6 vertebral body unchanged. Review of the MIP images confirms the above findings. IMPRESSION: 1. Moderate to large pericardial effusion is new from 10/31/2016 2. No evidence of pulmonary embolism. 3. New bilateral small effusions and LEFT basilar atelectasis versus infiltrate or pneumonitis. 4. Mild mediastinal and RIGHT supraclavicular adenopathy. Favor reactive adenopathy. Electronically Signed   By: Genevive Bi M.D.   On: 11/08/2016 09:34   Dg Chest Portable 1 View  Result Date: 11/08/2016 CLINICAL DATA:  Pericardiocentesis. EXAM: PORTABLE CHEST 1 VIEW COMPARISON:  CT 11/08/2016.  Chest x-ray 11/08/2016. FINDINGS: Cardiomegaly with bilateral pulmonary  interstitial prominence consistent with congestive heart failure. Small left pleural effusion. No pneumothorax. IMPRESSION: Congestive heart failure with bilateral pulmonary interstitial edema. Degree of cardiomegaly is unchanged from prior exam . Electronically Signed   By: Maisie Fus  Register   On: 11/08/2016 12:13   Dg Chest Portable 1 View  Result Date: 11/08/2016 CLINICAL DATA:  Chest pain. EXAM: PORTABLE CHEST 1 VIEW COMPARISON:  Radiographs of October 31, 2016. FINDINGS: Stable cardiomediastinal silhouette. Both lungs are clear. No pneumothorax or pleural effusion is noted. The visualized skeletal structures are unremarkable. IMPRESSION: No acute cardiopulmonary abnormality seen. Electronically Signed   By: Lupita Raider, M.D.   On: 11/08/2016 08:32   Dg Chest Portable 1 View  Result Date: 10/26/2016 CLINICAL DATA:  Status post fall, with left shoulder pain. Initial encounter. EXAM: PORTABLE CHEST 1 VIEW COMPARISON:  Chest radiograph performed 10/23/2016 FINDINGS: The lungs are well-aerated and clear. There is no evidence of focal opacification, pleural effusion or pneumothorax. The cardiomediastinal silhouette is borderline normal in size. No acute osseous abnormalities are seen. IMPRESSION: No acute cardiopulmonary process seen. Electronically Signed   By: Roanna Raider M.D.   On: 10/26/2016 19:26   Dg Shoulder Left  Result Date: 10/26/2016 CLINICAL DATA:  Patient fell last night and complains of left shoulder pain. EXAM: LEFT SHOULDER - 2+ VIEW COMPARISON:  07/09/2014 FINDINGS: Bones are diffusely demineralized. No evidence for an acute fracture. No evidence for shoulder dislocation or separation. Vascular stents noted proximal left all arm with apparent graft in situ. IMPRESSION: Negative. Electronically Signed   By: Kennith Center M.D.   On: 10/26/2016 18:54   Dg Hip Unilat W Or Wo Pelvis 2-3 Views Right  Result Date: 10/26/2016 CLINICAL DATA:  Status post fall, with right hip pain.  Initial encounter. EXAM: DG HIP (WITH OR WITHOUT PELVIS) 2-3V RIGHT COMPARISON:  Right hip radiographs performed 05/09/2016 FINDINGS: There is no evidence of fracture or dislocation. There is mildly increased uncovering of the right femoral head, suggestive of an underlying right hip joint effusion. Slight cortical irregularity is noted along the right femoral head, with mild chronic flattening. The left hip joint is unremarkable in appearance. The sacroiliac joints are unremarkable in appearance. The visualized bowel gas pattern is grossly unremarkable in appearance. A vascular stent is noted overlying the proximal left thigh. IMPRESSION: 1. No evidence of fracture or dislocation. 2. Mildly increased uncovering of the right femoral head, suggestive of an underlying right hip joint effusion. Slight cortical irregularity along the right femoral head, with mild chronic flattening. Electronically Signed   By: Roanna Raider M.D.   On: 10/26/2016 18:56     Diagnostic Studies/Procedures     11/08/16 Pericardiocentesis  Conclusion  Successful pericardiocentesis under fluoroscopic and echo guidance  Pericardiocentesis  Pericardium Pericardiocentesis performed by the subxiphoid approach. 550 mL of bloody fluid was removed from the pericardial cavity.  _____________  Limited Echo: 11/08/2016 Study Conclusions - Pericardium, extracardiac: There was a large circumferential pericardial drainage. Patient underwent drainage of effusion and now appears to be a mild residual circumferential effusion.  _____________  Limited Echo: 11/09/2016 LV EF: 50% - 55% Study Conclusions - Left ventricle: The cavity size was normal. Systolic function was normal. The estimated ejection fraction was in the range of 50% to 55%. Wall motion was normal; there were no regional wall motion abnormalities. - Aortic valve: There was mild regurgitation. - Mitral valve: There was mild regurgitation. -  Tricuspid valve: There was mild regurgitation. - Pericardium, extracardiac: A trivial pericardial effusion was identified anterior to the heart. There was a moderate-sized left pleural effusion Impressions: - Moderate to large pericardial effusion is resolved via pericardial drain.   _____________  2D ECHO: 11/13/2016 LV EF: 50% - 55% Study Conclusions - Left ventricle: The cavity size was normal. Wall thickness was increased in a pattern of mild LVH. Systolic function was normal. The estimated ejection fraction was in the range of 50% to 55%. Wall motion was normal; there were no regional wall motion abnormalities. The study is not technically sufficient to allow evaluation of LV diastolic function. - Aortic valve: There was mild regurgitation. Valve area (VTI): 2.54 cm^2. Valve area (Vmax): 3.14 cm^2. - Mitral valve: There was mild regurgitation. - Left atrium: The atrium was moderately dilated. - Pericardium, extracardiac: There is a trivial circumferential pericaridal effusion. - Technically adequate study.   Disposition   Pt is being discharged home today in good condition.  Follow-up Plans & Appointments    Follow-up Information    KHAN,SHAUKAT A, MD Follow up.   Specialty:  Cardiology Why:  Please follow up with Dr. Welton Flakes in 1-2 weeks Contact information: [address] [place] [address] [phone]            Discharge Medications     Medication List    TAKE these medications   fenofibrate 145 MG tablet Commonly known as:  TRICOR Take 145 mg by mouth every other day.   fludrocortisone 0.1 MG tablet Commonly known as:  FLORINEF Take 0.1 mg by mouth daily.   furosemide 40 MG tablet Commonly known as:  LASIX Take 40 mg by mouth 2 (two) times daily.   gabapentin 100 MG capsule Commonly known as:  NEURONTIN Take 400 mg by mouth at bedtime.   hydrocortisone 10 MG tablet Commonly known as:  CORTEF Take 0.5-1  tablets (5-10 mg total) by mouth 2 (two) times daily. Pt takes 10 mg in the morning and 5 mg at bedtime. Notes to patient:  Increase dose to 15 mg twice daily x2 weeks. Then on 11/29/16 decrease to 10 mg twice daily x2 weeks, then on 12/14/16 decrease back to 10 mg QAM and 5 mg QPM (current dose) thereafter.   levothyroxine 100 MCG tablet Commonly known as:  SYNTHROID, LEVOTHROID Take 100 mcg by mouth daily.   loratadine 10 MG tablet Commonly known as:  CLARITIN Take 10 mg by mouth daily.   lovastatin 40 MG tablet Commonly known as:  MEVACOR Take 20 mg by mouth every evening.   Melatonin 5 MG Tabs Take 2 tablets by mouth at bedtime.   nystatin ointment Commonly known as:  MYCOSTATIN Apply 1 application topically 2 (two) times daily. Apply to affected area (groin, abdominal fold).   ondansetron 4 MG tablet Commonly known as:  ZOFRAN Take 1 tablet (4 mg total) by mouth every 8 (eight) hours as needed for  nausea or vomiting.   oxyCODONE 5 MG immediate release tablet Commonly known as:  Oxy IR/ROXICODONE Take 1 tablet (5 mg total) by mouth every 6 (six) hours as needed for severe pain.   pantoprazole 40 MG tablet Commonly known as:  PROTONIX Take 1 tablet (40 mg total) by mouth daily.   risperidone 4 MG tablet Commonly known as:  RISPERDAL Take 2-4 mg by mouth 2 (two) times daily. Pt takes 4 mg in the morning and 2 mg at bedtime.   sertraline 50 MG tablet Commonly known as:  ZOLOFT Take 50 mg by mouth daily.   sevelamer carbonate 800 MG tablet Commonly known as:  RENVELA Take 2,400 mg by mouth 2 (two) times daily.   topiramate 200 MG tablet Commonly known as:  TOPAMAX Take 250 mg by mouth 2 (two) times daily. Pt takes with a 50 mg tablet.   traZODone 100 MG tablet Commonly known as:  DESYREL Take 100 mg by mouth at bedtime.   Vitamin D (Ergocalciferol) 50000 units Caps capsule Commonly known as:  DRISDOL Take 50,000 Units by mouth every 30 (thirty) days. Takes on the  8th of every month        Outstanding Labs/Studies   none  Duration of Discharge Encounter   Greater than 30 minutes including physician time.  Signed, Cline Crock PA-C 11/14/2016, 11:20 AM

## 2016-11-14 NOTE — Evaluation (Signed)
Physical Therapy Evaluation/ Discharge Patient Details Name: Erin [name] MRN: [id number] DOB: 10/12/1971 Today's Date: 11/14/2016   History of Present Illness  Pt admitted with pericardial tamponade s/p pericardiocentesis. PMHx; blind, ESRD, bipolar, CVA  Clinical Impression  Pt very pleasant and eager to return home. Pt with assist at baseline for bathing, transportation and shopping and reports currently being at baseline functional status. Pt did have 2 falls in the last year but reports alterations in her environment leading to tripping and fall. Pt able to mobilize without assist and no deficits with CP, dizziness or SOB. No further therapy needs at this time with pt aware and agreeable. Will sign off.     Follow Up Recommendations No PT follow up    Equipment Recommendations  None recommended by PT    Recommendations for Other Services       Precautions / Restrictions Precautions Precaution Comments: Blind      Mobility  Bed Mobility Overal bed mobility: Modified Independent                Transfers Overall transfer level: Modified independent                  Ambulation/Gait Ambulation/Gait assistance: Supervision Ambulation Distance (Feet): 250 Feet Assistive device: 1 person hand held assist Gait Pattern/deviations: WFL(Within Functional Limits)   Gait velocity interpretation: at or above normal speed for age/gender General Gait Details: pt using P.T. arm for guidance due to visual deficit with normal gait pattern and no LOB  Stairs            Wheelchair Mobility    Modified Rankin (Stroke Patients Only)       Balance Overall balance assessment: History of Falls   Sitting balance-Leahy Scale: Normal       Standing balance-Leahy Scale: Good                               Pertinent Vitals/Pain Pain Assessment: No/denies pain    Home Living Family/patient expects to be discharged to:: Private residence Living  Arrangements: Alone Available Help at Discharge: Friend(s);Available PRN/intermittently Type of Home: Apartment Home Access: Level entry     Home Layout: One level Home Equipment: None      Prior Function Level of Independence: Needs assistance   Gait / Transfers Assistance Needed: indendent with gait  ADL's / Homemaking Assistance Needed: aide for bathing 3x/wk as well as assist for transportation, pt able to cook and dress herself  Comments: pt reports 2 falls in the last year     Hand Dominance        Extremity/Trunk Assessment   Upper Extremity Assessment Upper Extremity Assessment: Overall WFL for tasks assessed    Lower Extremity Assessment Lower Extremity Assessment: Overall WFL for tasks assessed    Cervical / Trunk Assessment Cervical / Trunk Assessment: Normal  Communication   Communication: No difficulties  Cognition Arousal/Alertness: Awake/alert Behavior During Therapy: WFL for tasks assessed/performed Overall Cognitive Status: Within Functional Limits for tasks assessed                      General Comments General comments (skin integrity, edema, etc.): pt able to sit EOB to don pants independently and donned shirt in standing without assist    Exercises     Assessment/Plan    PT Assessment Patent does not need any further PT services  PT Problem List  PT Treatment Interventions      PT Goals (Current goals can be found in the Care Plan section)  Acute Rehab PT Goals PT Goal Formulation: All assessment and education complete, DC therapy    Frequency     Barriers to discharge        Co-evaluation               End of Session Equipment Utilized During Treatment: Gait belt Activity Tolerance: Patient tolerated treatment well Patient left: in bed;with call bell/phone within reach Nurse Communication: Mobility status         Time: 1610-9604 PT Time Calculation (min) (ACUTE ONLY): 11 min   Charges:    PT Evaluation $PT Eval Low Complexity: 1 Procedure     PT G Codes:        Chauntay Paszkiewicz B Twylah Bennetts 12/07/2016, 1:28 PM [name], PT [phone]

## 2016-11-14 NOTE — Plan of Care (Signed)
Problem: Activity: Goal: Risk for activity intolerance will decrease Outcome: Not Progressing Pt declined activity this AM and PM d/t fatigue

## 2016-11-15 LAB — EPSTEIN BARR VRS(EBV DNA BY PCR)
EBV DNA QN by PCR: NEGATIVE copies/mL
log10 EBV DNA Qn PCR: UNDETERMINED log10 copy/mL

## 2016-11-15 LAB — CMV DNA, QUANTITATIVE, PCR
CMV DNA Quant: NEGATIVE IU/mL
Log10 CMV Qn DNA Pl: UNDETERMINED log10 IU/mL

## 2016-11-19 ENCOUNTER — Emergency Department: Payer: Medicare Other

## 2016-11-19 ENCOUNTER — Emergency Department (HOSPITAL_BASED_OUTPATIENT_CLINIC_OR_DEPARTMENT_OTHER)
Admit: 2016-11-19 | Discharge: 2016-11-19 | Disposition: A | Discharge status: Home / Self Care | Payer: Medicare Other | Attending: Emergency Medicine | Admitting: Emergency Medicine

## 2016-11-19 ENCOUNTER — Inpatient Hospital Stay
Admission: EM | Admit: 2016-11-19 | Discharge: 2016-11-24 | DRG: 640 | Disposition: A | Discharge status: Skilled Nursing Facility | Payer: Medicare Other | Attending: Internal Medicine | Admitting: Internal Medicine

## 2016-11-19 ENCOUNTER — Inpatient Hospital Stay: Payer: Medicare Other

## 2016-11-19 ENCOUNTER — Encounter: Payer: Self-pay | Admitting: *Deleted

## 2016-11-19 DIAGNOSIS — Z8673 Personal history of transient ischemic attack (TIA), and cerebral infarction without residual deficits: Secondary | ICD-10-CM

## 2016-11-19 DIAGNOSIS — E871 Hypo-osmolality and hyponatremia: Secondary | ICD-10-CM | POA: Diagnosis present

## 2016-11-19 DIAGNOSIS — E875 Hyperkalemia: Secondary | ICD-10-CM | POA: Diagnosis present

## 2016-11-19 DIAGNOSIS — I248 Other forms of acute ischemic heart disease: Secondary | ICD-10-CM | POA: Diagnosis present

## 2016-11-19 DIAGNOSIS — K219 Gastro-esophageal reflux disease without esophagitis: Secondary | ICD-10-CM | POA: Diagnosis present

## 2016-11-19 DIAGNOSIS — E2740 Unspecified adrenocortical insufficiency: Secondary | ICD-10-CM | POA: Diagnosis present

## 2016-11-19 DIAGNOSIS — J45909 Unspecified asthma, uncomplicated: Secondary | ICD-10-CM | POA: Diagnosis present

## 2016-11-19 DIAGNOSIS — R079 Chest pain, unspecified: Secondary | ICD-10-CM

## 2016-11-19 DIAGNOSIS — E1122 Type 2 diabetes mellitus with diabetic chronic kidney disease: Secondary | ICD-10-CM | POA: Diagnosis present

## 2016-11-19 DIAGNOSIS — N186 End stage renal disease: Secondary | ICD-10-CM

## 2016-11-19 DIAGNOSIS — Z79899 Other long term (current) drug therapy: Secondary | ICD-10-CM

## 2016-11-19 DIAGNOSIS — R0602 Shortness of breath: Secondary | ICD-10-CM

## 2016-11-19 DIAGNOSIS — G40909 Epilepsy, unspecified, not intractable, without status epilepticus: Secondary | ICD-10-CM | POA: Diagnosis present

## 2016-11-19 DIAGNOSIS — M6281 Muscle weakness (generalized): Secondary | ICD-10-CM

## 2016-11-19 DIAGNOSIS — D631 Anemia in chronic kidney disease: Secondary | ICD-10-CM | POA: Diagnosis present

## 2016-11-19 DIAGNOSIS — Z86718 Personal history of other venous thrombosis and embolism: Secondary | ICD-10-CM | POA: Diagnosis not present

## 2016-11-19 DIAGNOSIS — K529 Noninfective gastroenteritis and colitis, unspecified: Secondary | ICD-10-CM | POA: Diagnosis present

## 2016-11-19 DIAGNOSIS — G894 Chronic pain syndrome: Secondary | ICD-10-CM | POA: Diagnosis present

## 2016-11-19 DIAGNOSIS — E785 Hyperlipidemia, unspecified: Secondary | ICD-10-CM | POA: Diagnosis present

## 2016-11-19 DIAGNOSIS — F319 Bipolar disorder, unspecified: Secondary | ICD-10-CM | POA: Diagnosis present

## 2016-11-19 DIAGNOSIS — F17210 Nicotine dependence, cigarettes, uncomplicated: Secondary | ICD-10-CM | POA: Diagnosis present

## 2016-11-19 DIAGNOSIS — Z992 Dependence on renal dialysis: Secondary | ICD-10-CM | POA: Diagnosis not present

## 2016-11-19 DIAGNOSIS — M1990 Unspecified osteoarthritis, unspecified site: Secondary | ICD-10-CM | POA: Diagnosis present

## 2016-11-19 DIAGNOSIS — I252 Old myocardial infarction: Secondary | ICD-10-CM | POA: Diagnosis not present

## 2016-11-19 DIAGNOSIS — F419 Anxiety disorder, unspecified: Secondary | ICD-10-CM | POA: Diagnosis present

## 2016-11-19 DIAGNOSIS — E232 Diabetes insipidus: Secondary | ICD-10-CM | POA: Diagnosis present

## 2016-11-19 DIAGNOSIS — M797 Fibromyalgia: Secondary | ICD-10-CM | POA: Diagnosis present

## 2016-11-19 DIAGNOSIS — E039 Hypothyroidism, unspecified: Secondary | ICD-10-CM | POA: Diagnosis present

## 2016-11-19 DIAGNOSIS — R7989 Other specified abnormal findings of blood chemistry: Secondary | ICD-10-CM

## 2016-11-19 DIAGNOSIS — E11649 Type 2 diabetes mellitus with hypoglycemia without coma: Secondary | ICD-10-CM | POA: Diagnosis present

## 2016-11-19 DIAGNOSIS — Z9115 Patient's noncompliance with renal dialysis: Secondary | ICD-10-CM

## 2016-11-19 DIAGNOSIS — N2581 Secondary hyperparathyroidism of renal origin: Secondary | ICD-10-CM | POA: Diagnosis present

## 2016-11-19 DIAGNOSIS — R52 Pain, unspecified: Secondary | ICD-10-CM

## 2016-11-19 DIAGNOSIS — H547 Unspecified visual loss: Secondary | ICD-10-CM | POA: Diagnosis present

## 2016-11-19 DIAGNOSIS — R778 Other specified abnormalities of plasma proteins: Secondary | ICD-10-CM

## 2016-11-19 DIAGNOSIS — E162 Hypoglycemia, unspecified: Secondary | ICD-10-CM

## 2016-11-19 DIAGNOSIS — I959 Hypotension, unspecified: Secondary | ICD-10-CM | POA: Diagnosis present

## 2016-11-19 DIAGNOSIS — R339 Retention of urine, unspecified: Secondary | ICD-10-CM | POA: Diagnosis present

## 2016-11-19 DIAGNOSIS — R2681 Unsteadiness on feet: Secondary | ICD-10-CM

## 2016-11-19 DIAGNOSIS — I21A1 Myocardial infarction type 2: Secondary | ICD-10-CM

## 2016-11-19 LAB — ECHOCARDIOGRAM COMPLETE
CHL CUP REG VEL DIAS: 123 cm/s
E decel time: 155 msec
E/e' ratio: 24.45
FS: 18 % — AB (ref 28–44)
Height: 58 in
IV/PV OW: 0.71
LA vol A4C: 57.5 ml
LADIAMINDEX: 2.59 cm/m2
LASIZE: 44 mm
LEFT ATRIUM END SYS DIAM: 44 mm
LV E/e'average: 24.45
LV TDI E'LATERAL: 5.44
LV e' LATERAL: 5.44 cm/s
LVEEMED: 24.45
Lateral S' vel: 10.1 cm/s
MV Dec: 155
MVPG: 7 mmHg
MVPKAVEL: 90 m/s
MVPKEVEL: 133 m/s
P 1/2 time: 364 ms
PV Reg grad dias: 6 mmHg
PW: 11.3 mm — AB (ref 0.6–1.1)
TAPSE: 19.1 mm
Weight: 2400 oz

## 2016-11-19 LAB — BASIC METABOLIC PANEL
Anion gap: 10 (ref 5–15)
BUN: 41 mg/dL — AB (ref 6–20)
CALCIUM: 7.7 mg/dL — AB (ref 8.9–10.3)
CO2: 23 mmol/L (ref 22–32)
Chloride: 96 mmol/L — ABNORMAL LOW (ref 101–111)
Creatinine, Ser: 5.78 mg/dL — ABNORMAL HIGH (ref 0.44–1.00)
GFR calc Af Amer: 9 mL/min — ABNORMAL LOW (ref >60)
GFR, EST NON AFRICAN AMERICAN: 8 mL/min — AB (ref >60)
GLUCOSE: 68 mg/dL (ref 65–99)
Potassium: 6.4 mmol/L (ref 3.5–5.1)
Sodium: 129 mmol/L — ABNORMAL LOW (ref 135–145)

## 2016-11-19 LAB — CBC WITH DIFFERENTIAL/PLATELET
Basophils Absolute: 0 10*3/uL (ref 0–0.1)
Basophils Relative: 1 %
EOS ABS: 0.4 10*3/uL (ref 0–0.7)
EOS PCT: 5 %
HCT: 36.1 % (ref 35.0–47.0)
Hemoglobin: 12.1 g/dL (ref 12.0–16.0)
LYMPHS ABS: 2 10*3/uL (ref 1.0–3.6)
LYMPHS PCT: 25 %
MCH: 31.2 pg (ref 26.0–34.0)
MCHC: 33.4 g/dL (ref 32.0–36.0)
MCV: 93.2 fL (ref 80.0–100.0)
Monocytes Absolute: 0.5 10*3/uL (ref 0.2–0.9)
Monocytes Relative: 6 %
Neutro Abs: 5.1 10*3/uL (ref 1.4–6.5)
Neutrophils Relative %: 63 %
PLATELETS: 278 10*3/uL (ref 150–440)
RBC: 3.87 MIL/uL (ref 3.80–5.20)
RDW: 19.8 % — AB (ref 11.5–14.5)
WBC: 8.1 10*3/uL (ref 3.6–11.0)

## 2016-11-19 LAB — RENAL FUNCTION PANEL
ALBUMIN: 2.8 g/dL — AB (ref 3.5–5.0)
Anion gap: 9 (ref 5–15)
BUN: 30 mg/dL — AB (ref 6–20)
CO2: 25 mmol/L (ref 22–32)
Calcium: 7.3 mg/dL — ABNORMAL LOW (ref 8.9–10.3)
Chloride: 101 mmol/L (ref 101–111)
Creatinine, Ser: 4.32 mg/dL — ABNORMAL HIGH (ref 0.44–1.00)
GFR calc Af Amer: 13 mL/min — ABNORMAL LOW (ref >60)
GFR calc non Af Amer: 12 mL/min — ABNORMAL LOW (ref >60)
GLUCOSE: 97 mg/dL (ref 65–99)
PHOSPHORUS: 3.7 mg/dL (ref 2.5–4.6)
POTASSIUM: 4.7 mmol/L (ref 3.5–5.1)
Sodium: 135 mmol/L (ref 135–145)

## 2016-11-19 LAB — CBC
HEMATOCRIT: 30.6 % — AB (ref 35.0–47.0)
Hemoglobin: 10.3 g/dL — ABNORMAL LOW (ref 12.0–16.0)
MCH: 30.7 pg (ref 26.0–34.0)
MCHC: 33.7 g/dL (ref 32.0–36.0)
MCV: 91.1 fL (ref 80.0–100.0)
Platelets: 235 10*3/uL (ref 150–440)
RBC: 3.36 MIL/uL — ABNORMAL LOW (ref 3.80–5.20)
RDW: 19.3 % — AB (ref 11.5–14.5)
WBC: 4.8 10*3/uL (ref 3.6–11.0)

## 2016-11-19 LAB — INFLUENZA PANEL BY PCR (TYPE A & B)
INFLAPCR: NEGATIVE
INFLBPCR: NEGATIVE

## 2016-11-19 LAB — GLUCOSE, CAPILLARY
GLUCOSE-CAPILLARY: 58 mg/dL — AB (ref 65–99)
GLUCOSE-CAPILLARY: 76 mg/dL (ref 65–99)
Glucose-Capillary: 43 mg/dL — CL (ref 65–99)
Glucose-Capillary: 40 mg/dL — CL (ref 65–99)
Glucose-Capillary: 87 mg/dL (ref 65–99)

## 2016-11-19 LAB — TROPONIN I
TROPONIN I: 0.8 ng/mL — AB (ref <0.03)
Troponin I: 0.88 ng/mL (ref <0.03)

## 2016-11-19 MED ORDER — FUROSEMIDE 40 MG PO TABS
40.0000 mg | ORAL_TABLET | Freq: Two times a day (BID) | ORAL | Status: DC
Start: 2016-11-19 — End: 2016-11-24
  Administered 2016-11-19 – 2016-11-24 (×8): 40 mg via ORAL
  Filled 2016-11-19 (×8): qty 1

## 2016-11-19 MED ORDER — SODIUM CHLORIDE 0.9% FLUSH
3.0000 mL | Freq: Two times a day (BID) | INTRAVENOUS | Status: DC
  Administered 2016-11-19 – 2016-11-24 (×9): 3 mL via INTRAVENOUS

## 2016-11-19 MED ORDER — ACETAMINOPHEN 325 MG PO TABS: 650.0000 mg | ORAL_TABLET | Freq: Four times a day (QID) | ORAL | Status: DC | PRN

## 2016-11-19 MED ORDER — SEVELAMER CARBONATE 800 MG PO TABS: 2400.0000 mg | ORAL_TABLET | Freq: Two times a day (BID) | ORAL | Status: DC

## 2016-11-19 MED ORDER — DEXTROSE 50 % IV SOLN: 1.0000 | Freq: Once | INTRAVENOUS | Status: DC

## 2016-11-19 MED ORDER — LIDOCAINE-PRILOCAINE 2.5-2.5 % EX CREA
1.0000 "application " | TOPICAL_CREAM | CUTANEOUS | Status: DC | PRN
  Filled 2016-11-19: qty 5

## 2016-11-19 MED ORDER — SERTRALINE HCL 50 MG PO TABS
50.0000 mg | ORAL_TABLET | Freq: Every day | ORAL | Status: DC
  Administered 2016-11-20 – 2016-11-24 (×5): 50 mg via ORAL
  Filled 2016-11-19 (×5): qty 1

## 2016-11-19 MED ORDER — ALBUTEROL SULFATE (2.5 MG/3ML) 0.083% IN NEBU
5.0000 mg | INHALATION_SOLUTION | Freq: Once | RESPIRATORY_TRACT | Status: DC
  Filled 2016-11-19: qty 6

## 2016-11-19 MED ORDER — ONDANSETRON HCL 4 MG/2ML IJ SOLN
4.0000 mg | Freq: Four times a day (QID) | INTRAMUSCULAR | Status: DC | PRN
  Administered 2016-11-22: 4 mg via INTRAVENOUS
  Filled 2016-11-19 (×2): qty 2

## 2016-11-19 MED ORDER — FUROSEMIDE 10 MG/ML IJ SOLN
60.0000 mg | Freq: Once | INTRAMUSCULAR | Status: AC
  Administered 2016-11-22: 60 mg via INTRAVENOUS
  Filled 2016-11-19: qty 6

## 2016-11-19 MED ORDER — LORATADINE 10 MG PO TABS
10.0000 mg | ORAL_TABLET | Freq: Every day | ORAL | Status: DC
  Administered 2016-11-20 – 2016-11-24 (×5): 10 mg via ORAL
  Filled 2016-11-19 (×6): qty 1

## 2016-11-19 MED ORDER — ASPIRIN EC 81 MG PO TBEC
81.0000 mg | DELAYED_RELEASE_TABLET | Freq: Every day | ORAL | Status: DC
  Administered 2016-11-21: 81 mg via ORAL
  Filled 2016-11-19 (×4): qty 1

## 2016-11-19 MED ORDER — SODIUM CHLORIDE 0.9 % IV SOLN: 100.0000 mL | INTRAVENOUS | Status: DC | PRN

## 2016-11-19 MED ORDER — LIDOCAINE HCL (PF) 1 % IJ SOLN
5.0000 mL | INTRAMUSCULAR | Status: DC | PRN
  Filled 2016-11-19: qty 5

## 2016-11-19 MED ORDER — FENOFIBRATE 160 MG PO TABS
160.0000 mg | ORAL_TABLET | Freq: Every day | ORAL | Status: DC
  Administered 2016-11-20 – 2016-11-24 (×4): 160 mg via ORAL
  Filled 2016-11-19 (×6): qty 1

## 2016-11-19 MED ORDER — PANTOPRAZOLE SODIUM 40 MG PO TBEC
40.0000 mg | DELAYED_RELEASE_TABLET | Freq: Every day | ORAL | Status: DC
  Administered 2016-11-20 – 2016-11-24 (×5): 40 mg via ORAL
  Filled 2016-11-19 (×5): qty 1

## 2016-11-19 MED ORDER — ONDANSETRON HCL 4 MG PO TABS: 4.0000 mg | ORAL_TABLET | Freq: Four times a day (QID) | ORAL | Status: DC | PRN

## 2016-11-19 MED ORDER — PRAVASTATIN SODIUM 20 MG PO TABS
20.0000 mg | ORAL_TABLET | Freq: Every day | ORAL | Status: DC
  Administered 2016-11-21 – 2016-11-23 (×3): 20 mg via ORAL
  Filled 2016-11-19 (×3): qty 1

## 2016-11-19 MED ORDER — TRAZODONE HCL 100 MG PO TABS
100.0000 mg | ORAL_TABLET | Freq: Every day | ORAL | Status: DC
  Administered 2016-11-19 – 2016-11-23 (×5): 100 mg via ORAL
  Filled 2016-11-19 (×5): qty 1

## 2016-11-19 MED ORDER — RISPERIDONE 1 MG PO TABS
2.0000 mg | ORAL_TABLET | Freq: Two times a day (BID) | ORAL | Status: DC
  Administered 2016-11-19 – 2016-11-21 (×5): 2 mg via ORAL
  Administered 2016-11-22: 4 mg via ORAL
  Administered 2016-11-22 – 2016-11-24 (×4): 2 mg via ORAL
  Filled 2016-11-19: qty 4
  Filled 2016-11-19: qty 2
  Filled 2016-11-19: qty 4
  Filled 2016-11-19 (×4): qty 2
  Filled 2016-11-19: qty 4
  Filled 2016-11-19 (×2): qty 2

## 2016-11-19 MED ORDER — FLUDROCORTISONE ACETATE 0.1 MG PO TABS
0.1000 mg | ORAL_TABLET | Freq: Every day | ORAL | Status: DC
  Administered 2016-11-20 – 2016-11-24 (×5): 0.1 mg via ORAL
  Filled 2016-11-19 (×5): qty 1

## 2016-11-19 MED ORDER — ALTEPLASE 2 MG IJ SOLR: 2.0000 mg | Freq: Once | INTRAMUSCULAR | Status: DC | PRN

## 2016-11-19 MED ORDER — LEVOTHYROXINE SODIUM 50 MCG PO TABS
100.0000 ug | ORAL_TABLET | Freq: Every day | ORAL | Status: DC
  Administered 2016-11-21 – 2016-11-24 (×4): 100 ug via ORAL
  Filled 2016-11-19 (×5): qty 2

## 2016-11-19 MED ORDER — MORPHINE SULFATE (PF) 4 MG/ML IV SOLN: 4.0000 mg | Freq: Once | INTRAVENOUS | Status: DC

## 2016-11-19 MED ORDER — ACETAMINOPHEN 650 MG RE SUPP: 650.0000 mg | Freq: Four times a day (QID) | RECTAL | Status: DC | PRN

## 2016-11-19 MED ORDER — HEPARIN SODIUM (PORCINE) 1000 UNIT/ML DIALYSIS
1000.0000 [IU] | INTRAMUSCULAR | Status: DC | PRN
  Filled 2016-11-19: qty 1

## 2016-11-19 MED ORDER — ONDANSETRON HCL 4 MG PO TABS
4.0000 mg | ORAL_TABLET | Freq: Three times a day (TID) | ORAL | Status: DC | PRN
Start: 2016-11-19 — End: 2016-11-24

## 2016-11-19 MED ORDER — METOPROLOL TARTRATE 25 MG PO TABS
25.0000 mg | ORAL_TABLET | Freq: Two times a day (BID) | ORAL | Status: DC
  Administered 2016-11-21: 25 mg via ORAL
  Filled 2016-11-19 (×5): qty 1

## 2016-11-19 MED ORDER — SEVELAMER CARBONATE 800 MG PO TABS
2400.0000 mg | ORAL_TABLET | Freq: Two times a day (BID) | ORAL | Status: DC
  Filled 2016-11-19: qty 1
  Filled 2016-11-19: qty 3

## 2016-11-19 MED ORDER — MELATONIN 5 MG PO TABS
2.0000 | ORAL_TABLET | Freq: Every day | ORAL | Status: DC
  Administered 2016-11-19 – 2016-11-23 (×5): 10 mg via ORAL
  Filled 2016-11-19 (×6): qty 2

## 2016-11-19 MED ORDER — NITROGLYCERIN 2 % TD OINT
0.5000 [in_us] | TOPICAL_OINTMENT | Freq: Four times a day (QID) | TRANSDERMAL | Status: DC
  Administered 2016-11-23 – 2016-11-24 (×3): 0.5 [in_us] via TOPICAL
  Filled 2016-11-19: qty 0.5
  Filled 2016-11-19: qty 1
  Filled 2016-11-19 (×2): qty 0.5
  Filled 2016-11-19 (×2): qty 1
  Filled 2016-11-19 (×4): qty 0.5

## 2016-11-19 MED ORDER — ONDANSETRON HCL 4 MG/2ML IJ SOLN
4.0000 mg | Freq: Four times a day (QID) | INTRAMUSCULAR | Status: DC | PRN
  Administered 2016-11-19 – 2016-11-20 (×2): 4 mg via INTRAVENOUS
  Filled 2016-11-19: qty 2

## 2016-11-19 MED ORDER — TOPIRAMATE 25 MG PO TABS
250.0000 mg | ORAL_TABLET | Freq: Two times a day (BID) | ORAL | Status: DC
  Administered 2016-11-21 – 2016-11-24 (×8): 250 mg via ORAL
  Filled 2016-11-19 (×11): qty 2

## 2016-11-19 MED ORDER — HYDROCORTISONE 5 MG PO TABS
5.0000 mg | ORAL_TABLET | Freq: Two times a day (BID) | ORAL | Status: DC
  Administered 2016-11-19: 10 mg via ORAL
  Filled 2016-11-19 (×3): qty 2

## 2016-11-19 MED ORDER — HYDROMORPHONE HCL 1 MG/ML IJ SOLN
1.0000 mg | INTRAMUSCULAR | Status: DC | PRN
Start: 2016-11-19 — End: 2016-11-23
  Administered 2016-11-19 – 2016-11-23 (×13): 1 mg via INTRAVENOUS
  Filled 2016-11-19 (×14): qty 1

## 2016-11-19 MED ORDER — NYSTATIN 100000 UNIT/GM EX OINT
1.0000 "application " | TOPICAL_OINTMENT | Freq: Two times a day (BID) | CUTANEOUS | Status: DC
  Administered 2016-11-21 (×3): 1 via TOPICAL
  Filled 2016-11-19: qty 15

## 2016-11-19 MED ORDER — GABAPENTIN 400 MG PO CAPS
400.0000 mg | ORAL_CAPSULE | Freq: Every day | ORAL | Status: DC
  Administered 2016-11-19 – 2016-11-23 (×5): 400 mg via ORAL
  Filled 2016-11-19 (×5): qty 1

## 2016-11-19 MED ORDER — SODIUM BICARBONATE 8.4 % IV SOLN
50.0000 meq | Freq: Once | INTRAVENOUS | Status: AC
  Administered 2016-11-19: 50 meq via INTRAVENOUS
  Filled 2016-11-19: qty 50

## 2016-11-19 MED ORDER — VITAMIN D (ERGOCALCIFEROL) 1.25 MG (50000 UNIT) PO CAPS: 50000.0000 [IU] | ORAL_CAPSULE | ORAL | Status: DC

## 2016-11-19 MED ORDER — HEPARIN SODIUM (PORCINE) 5000 UNIT/ML IJ SOLN
5000.0000 [IU] | Freq: Three times a day (TID) | INTRAMUSCULAR | Status: DC
  Filled 2016-11-19 (×7): qty 1

## 2016-11-19 MED ORDER — OXYCODONE HCL 5 MG PO TABS
5.0000 mg | ORAL_TABLET | Freq: Four times a day (QID) | ORAL | Status: DC | PRN
  Administered 2016-11-21 – 2016-11-24 (×6): 5 mg via ORAL
  Filled 2016-11-19 (×10): qty 1

## 2016-11-19 MED ORDER — PENTAFLUOROPROP-TETRAFLUOROETH EX AERO
1.0000 "application " | INHALATION_SPRAY | CUTANEOUS | Status: DC | PRN
  Filled 2016-11-19: qty 30

## 2016-11-19 NOTE — ED Notes (Signed)
Echo tech at bedside.

## 2016-11-19 NOTE — Progress Notes (Signed)
PRE HD INFO 

## 2016-11-19 NOTE — ED Provider Notes (Signed)
[facility] Emergency Department Provider Note  ____________________________________________  Time seen: Approximately 1:13 PM  I have reviewed the triage vital signs and the nursing notes.   HISTORY  Chief Complaint Chest Pain   HPI Erin [name] is a 46 y.o. female with h/o ESRD on HD (TTS), CP with clean cath on 09/2016, asthma, recently discharged from [facility] 5 days ago for pericardial effusion c/b tamponade and pericardiocentesis (thought to be due to missed dialysis) who presents for evaluation of chest pain. Patient reports that she was feeling better when she went home. Today started having chest pain that she describes as pressure located in the left side of her chest, radiating to her back associated with dyspnea on exertion. She reports that the pain was similar to when she last came into the hospital and was diagnosed the pericardial effusion.The pain started 2 hours prior to arrival and patient called EMS. She was given a full dose of aspirin. Patient has missed dialysis on Thursday and today with her last one being 4 days ago. She reports that she missed dialysis because she's been having severe diarrhea with multiple episodes a day. No melena or hematochezia. No nausea or vomiting, no cough, no fever or chills, no body aches.  Past Medical History:  Diagnosis Date  . Adrenal insufficiency (HCC)   . Anemia   . Anxiety   . Arthritis    "ankles, knees, hips" (11/11/2016)  . Asthma   . Bipolar affect, depressed (HCC)   . Blind    "since age 67 months"  . Chronic bronchitis (HCC)   . Chronic headaches    Seeing Pain Management  . Chronic upper back pain   . Constipation   . Depression   . Epilepsy (HCC)    "no seizure in a long time" (11/11/2016)  . ESRD on dialysis [facility])    "TTS; Cheree Ditto" (11/11/2016)  . Fibromyalgia   . GERD (gastroesophageal reflux disease)   . Heart murmur   . History of benign pituitary tumor   . History of blood  transfusion    "bleeding internally; never found out from where"  . History of DVT (deep vein thrombosis)    RLE  . History of hiatal hernia   . History of kidney stones   . Hyperlipidemia   . Hypothyroid   . Myocardial infarction 09/2016  . Narcolepsy   . 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 Pericardial effusion with cardiac tamponade    Hattie Perch 11/10/2016  . Pneumonia X 2  . Renal insufficiency   . Stroke [facility]) ~ 2002   "right side weaker since" (11/11/2016)    Patient Active Problem List   Diagnosis Date Noted  . Hyperkalemia 11/19/2016  . Elevated troponin 11/19/2016  . ESRD (end stage renal disease) (HCC) 11/19/2016  . Shock circulatory (HCC) 11/08/2016  . Cardiac/pericardial tamponade   . Palliative care encounter   . Goals of care, counseling/discussion   . Seizure disorder (HCC) 06/27/2016  . Blindness 06/27/2016  . Chronic pain 06/27/2016  . History of CVA (cerebrovascular accident) 06/27/2016  . Hypothyroidism 03/04/2016  . GERD (gastroesophageal reflux disease) 03/04/2016  . End stage renal disease on dialysis (HCC) 03/04/2016  . Bipolar 1 disorder, mixed, moderate (HCC) 02/26/2016  . 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 Anemia 02/25/2016    Past Surgical History:  Procedure Laterality Date  . BRAIN SURGERY  1976  . BREAST LUMPECTOMY Left  2001  . CARDIAC CATHETERIZATION Right 10/03/2016   Procedure: Coronary Angiography;  Surgeon: Laurier Nancy, MD;  Location: [facility] INVASIVE CV LAB;  Service: Cardiovascular;  Laterality: Right;  . CARDIAC CATHETERIZATION N/A 11/08/2016   Procedure: Pericardiocentesis;  Surgeon: Tonny Bollman, MD;  Location: [facility] INVASIVE CV LAB;  Service: Cardiovascular;  Laterality: N/A;  . DG AV DIALYSIS GRAFT DECLOT OR     X 4  . GIVENS CAPSULE STUDY N/A 02/29/2016   Procedure: GIVENS CAPSULE STUDY;  Surgeon: Elnita Maxwell, MD;   Location: [facility] ENDOSCOPY;  Service: Endoscopy;  Laterality: N/A;  . LAPAROSCOPIC CHOLECYSTECTOMY  2001  . PERICARDIOCENTESIS  11/08/2016   Pericardial effusion with tamponade Hattie Perch 11/10/2016  . PERIPHERAL VASCULAR CATHETERIZATION N/A 08/21/2015   Procedure: Dialysis/Perma Catheter Insertion;  Surgeon: Renford Dills, MD;  Location: [facility] INVASIVE CV LAB;  Service: Cardiovascular;  Laterality: N/A;  . PERIPHERAL VASCULAR CATHETERIZATION N/A 02/29/2016   Procedure: Dialysis/Perma Catheter Removal;  Surgeon: Annice Needy, MD;  Location: [facility] INVASIVE CV LAB;  Service: Cardiovascular;  Laterality: N/A;  . PERIPHERAL VASCULAR CATHETERIZATION N/A 10/28/2016   Procedure: A/V Fistulagram;  Surgeon: Renford Dills, MD;  Location: [facility] INVASIVE CV LAB;  Service: Cardiovascular;  Laterality: N/A;  . PERIPHERAL VASCULAR CATHETERIZATION N/A 10/28/2016   Procedure: A/V Shunt Intervention;  Surgeon: Renford Dills, MD;  Location: [facility] INVASIVE CV LAB;  Service: Cardiovascular;  Laterality: N/A;  . PITUITARY EXCISION  1976    Prior to Admission medications   Medication Sig Start Date End Date Taking? Authorizing Provider  fenofibrate (TRICOR) 145 MG tablet Take 145 mg by mouth every other day.   Yes Historical Provider, MD  fludrocortisone (FLORINEF) 0.1 MG tablet Take 0.1 mg by mouth daily.   Yes Historical Provider, MD  furosemide (LASIX) 40 MG tablet Take 40 mg by mouth 2 (two) times daily.   Yes Historical Provider, MD  gabapentin (NEURONTIN) 100 MG capsule Take 400 mg by mouth at bedtime.   Yes Historical Provider, MD  hydrocortisone (CORTEF) 10 MG tablet Take 0.5-1 tablets (5-10 mg total) by mouth 2 (two) times daily. Pt takes 10 mg in the morning and 5 mg at bedtime. 11/14/16  Yes Janetta Hora, PA-C  levothyroxine (SYNTHROID, LEVOTHROID) 100 MCG tablet Take 100 mcg by mouth daily.    Yes Historical Provider, MD  loratadine (CLARITIN) 10 MG tablet Take 10 mg by mouth daily.   Yes Historical  Provider, MD  lovastatin (MEVACOR) 20 MG tablet Take 20 mg by mouth every evening.    Yes Historical Provider, MD  Melatonin 5 MG TABS Take 2 tablets by mouth at bedtime.   Yes Historical Provider, MD  nystatin ointment (MYCOSTATIN) Apply 1 application topically 2 (two) times daily. Apply to affected area (groin, abdominal fold). 06/30/16  Yes Standley Brooking, MD  ondansetron (ZOFRAN) 4 MG tablet Take 1 tablet (4 mg total) by mouth every 8 (eight) hours as needed for nausea or vomiting. 10/15/16  Yes Nita Sickle, MD  pantoprazole (PROTONIX) 40 MG tablet Take 1 tablet (40 mg total) by mouth daily. 02/27/16  Yes [name], MD  risperidone (RISPERDAL) 4 MG tablet Take 2-4 mg by mouth 2 (two) times daily. Pt takes 4 mg in the morning and 2 mg at bedtime.   Yes Historical Provider, MD  sertraline (ZOLOFT) 50 MG tablet Take 50 mg by mouth daily.   Yes Historical Provider, MD  sevelamer carbonate (RENVELA) 800 MG tablet Take 2,400 mg by mouth 2 (  two) times daily.    Yes Historical Provider, MD  topiramate (TOPAMAX) 200 MG tablet Take 250 mg by mouth 2 (two) times daily. Pt takes with a 50 mg tablet.   Yes Historical Provider, MD  traZODone (DESYREL) 100 MG tablet Take 100 mg by mouth at bedtime.   Yes Historical Provider, MD  Vitamin D, Ergocalciferol, (DRISDOL) 50000 UNITS CAPS capsule Take 50,000 Units by mouth every 30 (thirty) days. Takes on the 8th of every month   Yes Historical Provider, MD  oxyCODONE (OXY IR/ROXICODONE) 5 MG immediate release tablet Take 1 tablet (5 mg total) by mouth every 6 (six) hours as needed for severe pain. Patient not taking: Reported on 11/19/2016 10/31/16   Sharyn Creamer, MD    Allergies Dhea [nutritional supplements]; Demerol [meperidine]; Floxin [ofloxacin]; Nsaids; Nubain [nalbuphine hcl]; Phenergan [promethazine hcl]; Stadol [butorphanol]; and Erythromycin  Family History  Problem Relation Age of Onset  . Heart attack Mother   . Migraines Mother   . Aneurysm  Mother   . Diabetes Father     Social History Social History  Substance Use Topics  . Smoking status: Current Every Day Smoker    Packs/day: 0.50    Years: 18.00    Types: Cigarettes  . Smokeless tobacco: Never Used  . Alcohol use 3.6 oz/week    6 Shots of liquor per week    Review of Systems  Constitutional: Negative for fever. Eyes: Negative for visual changes. ENT: Negative for sore throat. Neck: No neck pain  Cardiovascular: + chest pain. Respiratory: Negative for shortness of breath. + DOE Gastrointestinal: Negative for abdominal pain, vomiting, + diarrhea. Genitourinary: Negative for dysuria. Musculoskeletal: Negative for back pain. Skin: Negative for rash. Neurological: Negative for headaches, weakness or numbness. Psych: No SI or HI  ____________________________________________   PHYSICAL EXAM:  VITAL SIGNS: Vitals:   11/20/16 1415 11/20/16 1435  BP: (!) 106/56 107/62  Pulse: 78 70  Resp:    Temp: 98 F (36.7 C)    Constitutional: Alert and oriented. Well appearing and in no apparent distress. HEENT:      Head: Normocephalic and atraumatic.         Eyes: Conjunctivae are normal. Sclera is non-icteric. EOMI. PERRL      Mouth/Throat: Mucous membranes are moist.       Neck: Supple with no signs of meningismus. Cardiovascular: Regular rate and rhythm. No murmurs, gallops, or rubs. 2+ symmetrical distal pulses are present in all extremities. No JVD. Respiratory: Normal respiratory effort. Lungs are clear to auscultation bilaterally. No wheezes, crackles, or rhonchi.  Gastrointestinal: Soft, non tender, and non distended with positive bowel sounds. No rebound or guarding. Genitourinary: No CVA tenderness. Musculoskeletal: Nontender with normal range of motion in all extremities. No edema, cyanosis, or erythema of extremities. Neurologic: Normal speech and language. Face is symmetric. Moving all extremities. No gross focal neurologic deficits are  appreciated. Skin: Skin is warm, dry and intact. No rash noted. Psychiatric: Mood and affect are normal. Speech and behavior are normal.  ____________________________________________   LABS (all labs ordered are listed, but only abnormal results are displayed)  Labs Reviewed  CBC WITH DIFFERENTIAL/PLATELET - Abnormal; Notable for the following:       Result Value   RDW 19.8 (*)    All other components within normal limits  BASIC METABOLIC PANEL - Abnormal; Notable for the following:    Sodium 129 (*)    Potassium 6.4 (*)    Chloride 96 (*)    BUN  41 (*)    Creatinine, Ser 5.78 (*)    Calcium 7.7 (*)    GFR calc non Af Amer 8 (*)    GFR calc Af Amer 9 (*)    All other components within normal limits  TROPONIN I - Abnormal; Notable for the following:    Troponin I 0.88 (*)    All other components within normal limits  GLUCOSE, CAPILLARY - Abnormal; Notable for the following:    Glucose-Capillary 43 (*)    All other components within normal limits  GLUCOSE, CAPILLARY - Abnormal; Notable for the following:    Glucose-Capillary 58 (*)    All other components within normal limits  TROPONIN I - Abnormal; Notable for the following:    Troponin I 0.80 (*)    All other components within normal limits  TROPONIN I - Abnormal; Notable for the following:    Troponin I 0.78 (*)    All other components within normal limits  CBC - Abnormal; Notable for the following:    RBC 3.36 (*)    Hemoglobin 10.3 (*)    HCT 30.6 (*)    RDW 19.3 (*)    All other components within normal limits  RENAL FUNCTION PANEL - Abnormal; Notable for the following:    BUN 30 (*)    Creatinine, Ser 4.32 (*)    Calcium 7.3 (*)    Albumin 2.8 (*)    GFR calc non Af Amer 12 (*)    GFR calc Af Amer 13 (*)    All other components within normal limits  GLUCOSE, CAPILLARY - Abnormal; Notable for the following:    Glucose-Capillary 40 (*)    All other components within normal limits  BASIC METABOLIC PANEL -  Abnormal; Notable for the following:    Sodium 133 (*)    Potassium 7.3 (*)    Chloride 99 (*)    BUN 30 (*)    Creatinine, Ser 4.68 (*)    Calcium 7.4 (*)    GFR calc non Af Amer 11 (*)    GFR calc Af Amer 12 (*)    All other components within normal limits  CBC - Abnormal; Notable for the following:    RBC 3.45 (*)    Hemoglobin 10.8 (*)    HCT 32.1 (*)    RDW 19.7 (*)    All other components within normal limits  C DIFFICILE QUICK SCREEN W PCR REFLEX  GASTROINTESTINAL PANEL BY PCR, STOOL (REPLACES STOOL CULTURE)  INFLUENZA PANEL BY PCR (TYPE A & B)  GLUCOSE, CAPILLARY  GLUCOSE, CAPILLARY  GLUCOSE, CAPILLARY  GLUCOSE, CAPILLARY  POTASSIUM   ____________________________________________  EKG  ED ECG REPORT I, Nita Sickle, the attending physician, personally viewed and interpreted this ECG.  Normal sinus rhythm, rate of 76, borderline left bundle-branch block, T-wave inversions on the inferior lateral leads, normal QTC, right axis deviation, no ST elevations or depressions. ____________________________________________  RADIOLOGY  ECHO: small pericardial effusion  CXR: Negative ____________________________________________   PROCEDURES  Procedure(s) performed: None Procedures Critical Care performed: yes  CRITICAL CARE Performed by: Nita Sickle  ?  Total critical care time: 35 min  Critical care time was exclusive of separately billable procedures and treating other patients.  Critical care was necessary to treat or prevent imminent or life-threatening deterioration.  Critical care was time spent personally by me on the following activities: development of treatment plan with patient and/or surrogate as well as nursing, discussions with consultants, evaluation of patient's response to  treatment, examination of patient, obtaining history from patient or surrogate, ordering and performing treatments and interventions, ordering and review of  laboratory studies, ordering and review of radiographic studies, pulse oximetry and re-evaluation of patient's condition.  ____________________________________________   INITIAL IMPRESSION / ASSESSMENT AND PLAN / ED COURSE  46 y.o. female with h/o ESRD on HD (TTS), CP with clean cath on 09/2016, asthma, recently discharged from [facility] 5 days ago for pericardial effusion c/b tamponade and pericardiocentesis (thought to be due to missed dialysis) who presents for evaluation of chest pain after missing HD and with 2 days of profuse diarrhea. Will get ECHO to evaluate for recurrent pericardial effusion. We'll cycle cardiac markers. We'll check basic blood work for evaluate for need of emergency dialysis. EKG with no acute ischemic changes. Will check flu and CXR to eval for PNA. Will get C. Diff as patient has had diarrhea and recent hospital admission.  Clinical Course as of Nov 21 1619  Sat Nov 19, 2016  1615 Troponin 0.88 concerning for NSTEMI with no EKG changes. ECHO with no evidence of recurrent large pericardial effusion with tamponade physiology. K 6.4 patient was given bicarbonate and an albuterol. insulin has been given since patient has been hypoglycemic. Discussed with Dr. Wynelle Link who will dialyse patient. Will admit to Hospitalist.  [CV]    Clinical Course User Index [CV] Nita Sickle, MD    Pertinent labs & imaging results that were available during my care of the patient were reviewed by me and considered in my medical decision making (see chart for details).    ____________________________________________   FINAL CLINICAL IMPRESSION(S) / ED DIAGNOSES  Final diagnoses:  Chest pain, unspecified type  Hyperkalemia  Non-ST elevation myocardial infarction (NSTEMI), type 2 (HCC)  Hypoglycemia      NEW MEDICATIONS STARTED DURING THIS VISIT:  Current Discharge Medication List       Note:  This document was prepared using Dragon voice recognition software and may  include unintentional dictation errors.    Nita Sickle, MD 11/20/16 1622

## 2016-11-19 NOTE — Progress Notes (Signed)
Post hd assessment 

## 2016-11-19 NOTE — ED Notes (Signed)
Patient was incontinent to soft, brown stool. Patient was cleaned up and linens were changed. Patient was repositioned on the stretcher. Patient tolerated procedure well.

## 2016-11-19 NOTE — ED Notes (Signed)
X-ray at bedside

## 2016-11-19 NOTE — Progress Notes (Signed)
Pre hd assessment  

## 2016-11-19 NOTE — Progress Notes (Signed)
Start of hd 

## 2016-11-19 NOTE — ED Notes (Signed)
Dr. Seth Bake contacted about patient refusing the albuterol and administration of the sodium bicarb. Patient did agree to the sodium bicarb. Dialysis was contacted about when the patient would go to dialysis and stated that as soon as the patient came to the room, she would come and get her.

## 2016-11-19 NOTE — ED Triage Notes (Signed)
Per EMS report, Patient c/o chest pain for two hours prior to arrival. Patient was given ASA 324mg  enroute. SR on monitor in the ambulance. Patient c/o hypoglycemia. BS enroute was 78.

## 2016-11-19 NOTE — Progress Notes (Signed)
  End of hd 

## 2016-11-19 NOTE — H&P (Signed)
[facility] Physicians - [place] at [facility]   PATIENT NAME: Erin [name]    MR#:  [id number]  DATE OF BIRTH:  08/31/1971  DATE OF ADMISSION:  11/19/2016  PRIMARY CARE PHYSICIAN: [facility]   REQUESTING/REFERRING PHYSICIAN:   CHIEF COMPLAINT:   Chief Complaint  Patient presents with  . Chest Pain    HISTORY OF PRESENT ILLNESS: Erin [name]  is a 46 y.o. female with a known history of Multiple medical problems including diabetes insipidus, end-stage renal disease after she was initiated on upper and at [facility], chronic pain syndrome, who presents to the hospital with complaints of left upper chest pain, nausea, diarrhea. Went to the patient, she was fine up until 2 days ago when she started having diarrheal stool and nausea, no vomiting, she admits of abdominal pain in lower abdomen, it is described as intermittent cramping pain, worsening just prior to defecation, improving with bowel movement. She admits of nausea but no vomiting. She also started having left upper chest pain today in the morning when she was lying in bed. Pain is described as pressure type, 10 out of 10 by intensity, constant with no radiation, increasing with deep breathing and sitting up. Except that she decided to come to emergency room for further evaluation. Of note, she missed hemodialysis on Thursday, 2 days ago. Emergency room, labs revealed elevated potassium level, mild elevation of troponin and hospitalist services were contacted for admission.  PAST MEDICAL HISTORY:   Past Medical History:  Diagnosis Date  . Adrenal insufficiency (HCC)   . Anemia   . Anxiety   . Arthritis    "ankles, knees, hips" (11/11/2016)  . Asthma   . Bipolar affect, depressed (HCC)   . Blind    "since age 5 months"  . Chronic bronchitis (HCC)   . Chronic headaches    Seeing Pain Management  . Chronic upper back pain   . Constipation   . Depression   . Epilepsy (HCC)    "no seizure in a long time"  (11/11/2016)  . ESRD on dialysis [facility])    "TTS; Cheree Ditto" (11/11/2016)  . Fibromyalgia   . GERD (gastroesophageal reflux disease)   . Heart murmur   . History of benign pituitary tumor   . History of blood transfusion    "bleeding internally; never found out from where"  . History of DVT (deep vein thrombosis)    RLE  . History of hiatal hernia   . History of kidney stones   . Hyperlipidemia   . Hypothyroid   . Myocardial infarction 09/2016  . Narcolepsy   . 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 Pericardial effusion with cardiac tamponade    Hattie Perch 11/10/2016  . Pneumonia X 2  . Renal insufficiency   . Stroke [facility]) ~ 2002   "right side weaker since" (11/11/2016)    PAST SURGICAL HISTORY: Past Surgical History:  Procedure Laterality Date  . BRAIN SURGERY  1976  . BREAST LUMPECTOMY Left 2001  . CARDIAC CATHETERIZATION Right 10/03/2016   Procedure: Coronary Angiography;  Surgeon: Laurier Nancy, MD;  Location[facility] INVASIVE CV LAB;  Service: Cardiovascular;  Laterality: Right;  . CARDIAC CATHETERIZATION N/A 11/08/2016   Procedure: Pericardiocentesis;  Surgeon: Tonny Bollman, MD;  Location: [facility] INVASIVE CV LAB;  Service: Cardiovascular;  Laterality: N/A;  . DG AV DIALYSIS GRAFT DECLOT OR     X 4  . GIVENS CAPSULE STUDY N/A 02/29/2016   Procedure: GIVENS CAPSULE STUDY;  Surgeon: Elnita Maxwell, MD;  Location: [facility] ENDOSCOPY;  Service: Endoscopy;  Laterality: N/A;  . LAPAROSCOPIC CHOLECYSTECTOMY  2001  . PERICARDIOCENTESIS  11/08/2016   Pericardial effusion with tamponade Hattie Perch 11/10/2016  . PERIPHERAL VASCULAR CATHETERIZATION N/A 08/21/2015   Procedure: Dialysis/Perma Catheter Insertion;  Surgeon: Renford Dills, MD;  Location: [facility] INVASIVE CV LAB;  Service: Cardiovascular;  Laterality: N/A;  . PERIPHERAL VASCULAR CATHETERIZATION N/A 02/29/2016   Procedure: Dialysis/Perma Catheter Removal;  Surgeon: Annice Needy, MD;  Location: [facility] INVASIVE  CV LAB;  Service: Cardiovascular;  Laterality: N/A;  . PERIPHERAL VASCULAR CATHETERIZATION N/A 10/28/2016   Procedure: A/V Fistulagram;  Surgeon: Renford Dills, MD;  Location: [facility] INVASIVE CV LAB;  Service: Cardiovascular;  Laterality: N/A;  . PERIPHERAL VASCULAR CATHETERIZATION N/A 10/28/2016   Procedure: A/V Shunt Intervention;  Surgeon: Renford Dills, MD;  Location: [facility] INVASIVE CV LAB;  Service: Cardiovascular;  Laterality: N/A;  . PITUITARY EXCISION  1976    SOCIAL HISTORY:  Social History  Substance Use Topics  . Smoking status: Current Every Day Smoker    Packs/day: 0.50    Years: 18.00    Types: Cigarettes  . Smokeless tobacco: Never Used  . Alcohol use 3.6 oz/week    6 Shots of liquor per week    FAMILY HISTORY:  Family History  Problem Relation Age of Onset  . Heart attack Mother   . Migraines Mother   . Aneurysm Mother   . Diabetes Father     DRUG ALLERGIES:  Allergies  Allergen Reactions  . Dhea [Nutritional Supplements] Anaphylaxis    Patient states medication is DHE for migraines, not DHEA  . Demerol [Meperidine] Other (See Comments)    Reaction:  Hallucinations   . Floxin [Ofloxacin] Other (See Comments)    Reaction:  Hallucinations   . Nsaids Nausea And Vomiting and Swelling  . Nubain [Nalbuphine Hcl] Other (See Comments)    Reaction:  Hallucinations   . Phenergan [Promethazine Hcl] Other (See Comments)    Reaction:  Restless legs   . Stadol [Butorphanol] Other (See Comments)    Reaction:  Hallucinations   . Erythromycin Diarrhea and Rash    Review of Systems  Constitutional: Positive for chills. Negative for fever and weight loss.  HENT: Negative for congestion.   Eyes: Negative for blurred vision and double vision.  Respiratory: Positive for shortness of breath. Negative for cough, sputum production and wheezing.   Cardiovascular: Positive for chest pain. Negative for palpitations, orthopnea, leg swelling and PND.  Gastrointestinal:  Positive for abdominal pain, diarrhea and nausea. Negative for blood in stool, constipation and vomiting.  Genitourinary: Negative for dysuria, frequency, hematuria and urgency.  Musculoskeletal: Positive for myalgias. Negative for falls.  Neurological: Negative for dizziness, tremors, focal weakness and headaches.  Endo/Heme/Allergies: Does not bruise/bleed easily.  Psychiatric/Behavioral: Negative for depression. The patient does not have insomnia.     MEDICATIONS AT HOME:  Prior to Admission medications   Medication Sig Start Date End Date Taking? Authorizing Provider  fenofibrate (TRICOR) 145 MG tablet Take 145 mg by mouth every other day.   Yes Historical Provider, MD  fludrocortisone (FLORINEF) 0.1 MG tablet Take 0.1 mg by mouth daily.   Yes Historical Provider, MD  furosemide (LASIX) 40 MG tablet Take 40 mg by mouth 2 (two) times daily.   Yes Historical Provider, MD  gabapentin (NEURONTIN) 100 MG capsule Take 400 mg by mouth at bedtime.   Yes Historical Provider, MD  hydrocortisone (CORTEF) 10 MG tablet  Take 0.5-1 tablets (5-10 mg total) by mouth 2 (two) times daily. Pt takes 10 mg in the morning and 5 mg at bedtime. 11/14/16  Yes Janetta Hora, PA-C  levothyroxine (SYNTHROID, LEVOTHROID) 100 MCG tablet Take 100 mcg by mouth daily.    Yes Historical Provider, MD  loratadine (CLARITIN) 10 MG tablet Take 10 mg by mouth daily.   Yes Historical Provider, MD  lovastatin (MEVACOR) 20 MG tablet Take 20 mg by mouth every evening.    Yes Historical Provider, MD  Melatonin 5 MG TABS Take 2 tablets by mouth at bedtime.   Yes Historical Provider, MD  nystatin ointment (MYCOSTATIN) Apply 1 application topically 2 (two) times daily. Apply to affected area (groin, abdominal fold). 06/30/16  Yes Standley Brooking, MD  ondansetron (ZOFRAN) 4 MG tablet Take 1 tablet (4 mg total) by mouth every 8 (eight) hours as needed for nausea or vomiting. 10/15/16  Yes Nita Sickle, MD  pantoprazole (PROTONIX)  40 MG tablet Take 1 tablet (40 mg total) by mouth daily. 02/27/16  Yes [name], MD  risperidone (RISPERDAL) 4 MG tablet Take 2-4 mg by mouth 2 (two) times daily. Pt takes 4 mg in the morning and 2 mg at bedtime.   Yes Historical Provider, MD  sertraline (ZOLOFT) 50 MG tablet Take 50 mg by mouth daily.   Yes Historical Provider, MD  sevelamer carbonate (RENVELA) 800 MG tablet Take 2,400 mg by mouth 2 (two) times daily.    Yes Historical Provider, MD  topiramate (TOPAMAX) 200 MG tablet Take 250 mg by mouth 2 (two) times daily. Pt takes with a 50 mg tablet.   Yes Historical Provider, MD  traZODone (DESYREL) 100 MG tablet Take 100 mg by mouth at bedtime.   Yes Historical Provider, MD  Vitamin D, Ergocalciferol, (DRISDOL) 50000 UNITS CAPS capsule Take 50,000 Units by mouth every 30 (thirty) days. Takes on the 8th of every month   Yes Historical Provider, MD  oxyCODONE (OXY IR/ROXICODONE) 5 MG immediate release tablet Take 1 tablet (5 mg total) by mouth every 6 (six) hours as needed for severe pain. Patient not taking: Reported on 11/19/2016 10/31/16   Sharyn Creamer, MD      PHYSICAL EXAMINATION:   VITAL SIGNS: Blood pressure 138/77, pulse 76, temperature 98 F (36.7 C), temperature source Oral, resp. rate 14, height 4\' 10"  (1.473 m), weight 68 kg (150 lb), last menstrual period 11/21/2007, SpO2 100 %.  GENERAL:  46 y.o.-year-old patient lying in the bed with no acute distress, pale, uncomfortable due to abdominal pain and nausea  EYES: Pupils equal, round, reactive to light and accommodation. No scleral icterus. Extraocular muscles intact.  HEENT: Head atraumatic, normocephalic. Oropharynx and nasopharynx clear.  NECK:  Supple, no jugular venous distention. No thyroid enlargement, no tenderness.  LUNGS: Normal breath sounds bilaterally, no wheezing, rales,rhonchi or crepitation. No use of accessory muscles of respiration.  CARDIOVASCULAR: S1, S2 normal. No murmurs, rubs, or gallops.  ABDOMEN: Soft,  diffusely tender, mostly in the upper abdomen, but no rebound or guarding was noted,, nondistended. Bowel sounds present. No organomegaly or mass.  EXTREMITIES: No pedal edema, cyanosis, or clubbing. Right upper extremity AV graft with good bruit and thrill NEUROLOGIC: Cranial nerves II through XII are intact. Muscle strength 5/5 in all extremities. Sensation intact. Gait not checked.  PSYCHIATRIC: The patient is alert and oriented x 3. Intermittent nystagmus bilaterally, patient is blind SKIN: No obvious rash, lesion, or ulcer.   LABORATORY PANEL:   CBC  Recent Labs Lab 11/13/16 0521 11/14/16 0357 11/19/16 1513  WBC 5.2 5.4 8.1  HGB 8.4* 8.5* 12.1  HCT 26.2* 26.8* 36.1  PLT 136* 168 278  MCV 93.2 94.0 93.2  MCH 29.9 29.8 31.2  MCHC 32.1 31.7 33.4  RDW 18.1* 18.4* 19.8*  LYMPHSABS  --   --  2.0  MONOABS  --   --  0.5  EOSABS  --   --  0.4  BASOSABS  --   --  0.0   ------------------------------------------------------------------------------------------------------------------  Chemistries   Recent Labs Lab 11/19/16 1513  NA 129*  K 6.4*  CL 96*  CO2 23  GLUCOSE 68  BUN 41*  CREATININE 5.78*  CALCIUM 7.7*   ------------------------------------------------------------------------------------------------------------------  Cardiac Enzymes  Recent Labs Lab 11/19/16 1513  TROPONINI 0.88*   ------------------------------------------------------------------------------------------------------------------  RADIOLOGY: Dg Chest Port 1 View  Result Date: 11/19/2016 CLINICAL DATA:  Chest pain for 2 hours EXAM: PORTABLE CHEST 1 VIEW COMPARISON:  11/08/2016 FINDINGS: Cardiomegaly again noted. No infiltrate or pleural effusion. No pulmonary edema. Bony thorax is stable. IMPRESSION: No active disease. Electronically Signed   By: Natasha Mead M.D.   On: 11/19/2016 14:03    EKG: Orders placed or performed during the hospital encounter of 11/19/16  . ED EKG  . ED EKG  .  EKG 12-Lead  . EKG 12-Lead  . ED EKG  . ED EKG   EKG in the emergency room revealed normal sinus rhythm at 76 bpm, nonspecific intraventricular conduction delay, borderline T-wave depressions in lateral leads, no acute ST-T changes were IMPRESSION AND PLAN:  Principal Problem:   Hyperkalemia Active Problems:   Elevated troponin   ESRD (end stage renal disease) (HCC)  #1. Hyperkalemia due to missed hemodialysis session, admit patient, medical floor, nephrology consultation is requested, hemodialysis is going to be performed today, follow potassium level after dialysis. The patient was already given bicarbonate intravenously #2. Elevated troponin, likely demand ischemia, unremarkable EKG, initiate patient on aspirin, metoprolol, nitroglycerin, follow cardiac enzymes 3, echocardiogram was unremarkable, small pericardial effusion was noted #3. Diarrhea, get stool cultures, including C. Difficile, supportive therapy for now.  #4 . Hyponatremia, follow with dialysis,    All the records are reviewed and case discussed with ED provider. Management plans discussed with the patient, family and they are in agreement.  CODE STATUS: Code Status History    Date Active Date Inactive Code Status Order ID Comments User Context   11/08/2016  1:41 PM 11/14/2016  4:56 PM Full Code [id number]  Simonne Martinet, NP Inpatient   10/26/2016  9:44 PM 10/29/2016  9:58 PM Full Code [id number]  Tonye Royalty, DO Inpatient   10/02/2016  5:09 AM 10/04/2016  7:05 PM Full Code [id number]  Arnaldo Natal, MD Inpatient   06/27/2016  8:27 PM 06/30/2016  5:15 PM Full Code [id number]  Delano Metz, MD Inpatient   03/04/2016  6:04 AM 03/07/2016  6:24 PM Full Code [id number]  Oralia Manis, MD Inpatient   02/25/2016  3:36 PM 03/01/2016  3:06 PM Full Code [id number]  Enedina Finner, MD Inpatient       TOTAL TIME TAKING CARE OF THIS PATIENT: 50  minutes.    Katharina Caper M.D on 11/19/2016 at 4:49 PM  Between 7am to 6pm - Pager -  [phone] After 6pm go to [email or web address] - password [email or web address] [facility]  [place] [facility]  Office  [phone]  CC: Primary care physician; [facility]

## 2016-11-19 NOTE — Progress Notes (Signed)
Post hd vitals 

## 2016-11-19 NOTE — ED Notes (Signed)
Dr. Don Perking informed of Glucose reading. Patient given orange juice, peanut butter and graham crackers. Other orders are deferred until echo is finished.

## 2016-11-20 LAB — TROPONIN I: TROPONIN I: 0.78 ng/mL — AB (ref <0.03)

## 2016-11-20 LAB — CBC
HEMATOCRIT: 32.1 % — AB (ref 35.0–47.0)
HEMOGLOBIN: 10.8 g/dL — AB (ref 12.0–16.0)
MCH: 31.4 pg (ref 26.0–34.0)
MCHC: 33.7 g/dL (ref 32.0–36.0)
MCV: 93 fL (ref 80.0–100.0)
Platelets: 224 10*3/uL (ref 150–440)
RBC: 3.45 MIL/uL — AB (ref 3.80–5.20)
RDW: 19.7 % — ABNORMAL HIGH (ref 11.5–14.5)
WBC: 6 10*3/uL (ref 3.6–11.0)

## 2016-11-20 LAB — BASIC METABOLIC PANEL
ANION GAP: 7 (ref 5–15)
BUN: 30 mg/dL — ABNORMAL HIGH (ref 6–20)
CO2: 27 mmol/L (ref 22–32)
Calcium: 7.4 mg/dL — ABNORMAL LOW (ref 8.9–10.3)
Chloride: 99 mmol/L — ABNORMAL LOW (ref 101–111)
Creatinine, Ser: 4.68 mg/dL — ABNORMAL HIGH (ref 0.44–1.00)
GFR calc Af Amer: 12 mL/min — ABNORMAL LOW (ref >60)
GFR calc non Af Amer: 11 mL/min — ABNORMAL LOW (ref >60)
GLUCOSE: 67 mg/dL (ref 65–99)
POTASSIUM: 7.3 mmol/L — AB (ref 3.5–5.1)
Sodium: 133 mmol/L — ABNORMAL LOW (ref 135–145)

## 2016-11-20 LAB — GLUCOSE, CAPILLARY
GLUCOSE-CAPILLARY: 94 mg/dL (ref 65–99)
Glucose-Capillary: 75 mg/dL (ref 65–99)

## 2016-11-20 LAB — POTASSIUM: Potassium: 3.9 mmol/L (ref 3.5–5.1)

## 2016-11-20 MED ORDER — NICOTINE 14 MG/24HR TD PT24
14.0000 mg | MEDICATED_PATCH | Freq: Every day | TRANSDERMAL | Status: DC
  Administered 2016-11-20 – 2016-11-24 (×5): 14 mg via TRANSDERMAL
  Filled 2016-11-20 (×5): qty 1

## 2016-11-20 MED ORDER — HYDROCORTISONE 10 MG PO TABS
10.0000 mg | ORAL_TABLET | Freq: Every day | ORAL | Status: DC
  Administered 2016-11-21 – 2016-11-24 (×4): 10 mg via ORAL
  Filled 2016-11-20 (×4): qty 1

## 2016-11-20 MED ORDER — SEVELAMER CARBONATE 800 MG PO TABS
800.0000 mg | ORAL_TABLET | Freq: Two times a day (BID) | ORAL | Status: DC
  Administered 2016-11-21 – 2016-11-23 (×5): 800 mg via ORAL
  Filled 2016-11-20 (×8): qty 1

## 2016-11-20 MED ORDER — SODIUM BICARBONATE 8.4 % IV SOLN
50.0000 meq | INTRAVENOUS | Status: AC
  Administered 2016-11-20: 50 meq via INTRAVENOUS
  Filled 2016-11-20: qty 50

## 2016-11-20 MED ORDER — CALCIUM CHLORIDE 10 % IV SOLN
1.0000 g | INTRAVENOUS | Status: AC
  Administered 2016-11-20: 1 g via INTRAVENOUS
  Filled 2016-11-20: qty 10

## 2016-11-20 MED ORDER — HYDROCORTISONE 10 MG PO TABS
5.0000 mg | ORAL_TABLET | Freq: Every day | ORAL | Status: DC
  Administered 2016-11-20 – 2016-11-23 (×4): 5 mg via ORAL
  Filled 2016-11-20 (×5): qty 1

## 2016-11-20 NOTE — Progress Notes (Addendum)
Dr. Luberta Mutter paged for K+ 7.3; pt has been hypoglycemic.  Order for 1 amp Calcium carbonate & 1 amp Na Bicarb, both IV.

## 2016-11-20 NOTE — Progress Notes (Signed)
[facility] Physicians - [place] at [facility]   PATIENT NAME: Erin [name]    MR#:  [id number]  DATE OF BIRTH:  Nov 21, 1970  SUBJECTIVE: Seen at bedside admitted because of pain, nausea, diarrhea, mild abdominal pain. Patient missed hemodialysis on Thursday because of snow. Elevated potassium on admission. Getting hemodialysis at this time. No further  diarrhea.   CHIEF COMPLAINT:   Chief Complaint  Patient presents with  . Chest Pain    REVIEW OF SYSTEMS:   ROS CONSTITUTIONAL: No fever, fatigue or weakness.  EYES: No blurred or double vision.  EARS, NOSE, AND THROAT: No tinnitus or ear pain.  RESPIRATORY: No cough, shortness of breath, wheezing or hemoptysis.  CARDIOVASCULAR: No chest pain, orthopnea, edema.  GASTROINTESTINAL: No nausea, vomiting, diarrhea or abdominal pain.  GENITOURINARY: No dysuria, hematuria.  ENDOCRINE: No polyuria, nocturia,  HEMATOLOGY: No anemia, easy bruising or bleeding SKIN: No rash or lesion. MUSCULOSKELETAL: No joint pain or arthritis.   NEUROLOGIC: No tingling, numbness, weakness.  PSYCHIATRY: No anxiety or depression.   DRUG ALLERGIES:   Allergies  Allergen Reactions  . Dhea [Nutritional Supplements] Anaphylaxis    Patient states medication is DHE for migraines, not DHEA  . Demerol [Meperidine] Other (See Comments)    Reaction:  Hallucinations   . Floxin [Ofloxacin] Other (See Comments)    Reaction:  Hallucinations   . Nsaids Nausea And Vomiting and Swelling  . Nubain [Nalbuphine Hcl] Other (See Comments)    Reaction:  Hallucinations   . Phenergan [Promethazine Hcl] Other (See Comments)    Reaction:  Restless legs   . Stadol [Butorphanol] Other (See Comments)    Reaction:  Hallucinations   . Erythromycin Diarrhea and Rash    VITALS:  Blood pressure 107/60, pulse 73, temperature 97.7 F (36.5 C), temperature source Oral, resp. rate 18, height 4\' 10"  (1.473 m), weight 75.9 kg (167 lb 5.3 oz), last menstrual period  11/21/2007, SpO2 99 %.  PHYSICAL EXAMINATION:  GENERAL:  46 y.o.-year-old patient lying in the bed with no acute distress.  EYES: Pupils equal, round, reactive to light and accommodation. No scleral icterus. Extraocular muscles intact.  HEENT: Head atraumatic, normocephalic. Oropharynx and nasopharynx clear.  NECK:  Supple, no jugular venous distention. No thyroid enlargement, no tenderness.  LUNGS: Normal breath sounds bilaterally, no wheezing, rales,rhonchi or crepitation. No use of accessory muscles of respiration.  CARDIOVASCULAR: S1, S2 normal. No murmurs, rubs, or gallops.  ABDOMEN: Soft, nontender, nondistended. Bowel sounds present. No organomegaly or mass.  EXTREMITIES: No pedal edema, cyanosis, or clubbing.  NEUROLOGIC: Cranial nerves II through XII are intact. Muscle strength 5/5 in all extremities. Sensation intact. Gait not checked.  PSYCHIATRIC: The patient is alert and oriented x 3.  SKIN: No obvious rash, lesion, or ulcer.    LABORATORY PANEL:   CBC  Recent Labs Lab 11/20/16 0614  WBC 6.0  HGB 10.8*  HCT 32.1*  PLT 224   ------------------------------------------------------------------------------------------------------------------  Chemistries   Recent Labs Lab 11/20/16 0614  NA 133*  K 7.3*  CL 99*  CO2 27  GLUCOSE 67  BUN 30*  CREATININE 4.68*  CALCIUM 7.4*   ------------------------------------------------------------------------------------------------------------------  Cardiac Enzymes  Recent Labs Lab 11/20/16 0051  TROPONINI 0.78*   ------------------------------------------------------------------------------------------------------------------  RADIOLOGY:  Dg Abd 1 View  Result Date: 11/19/2016 CLINICAL DATA:  Lower abdominal pain and cramping.  Nausea. EXAM: ABDOMEN - 1 VIEW COMPARISON:  11/08/2016 FINDINGS: The bowel gas pattern is normal. Right upper quadrant surgical clips from prior cholecystectomy. No  radio-opaque calculi or  other significant radiographic abnormality are seen. IMPRESSION: Normal bowel gas pattern.  No acute findings. Electronically Signed   By: Myles Rosenthal M.D.   On: 11/19/2016 19:34   Dg Chest Port 1 View  Result Date: 11/19/2016 CLINICAL DATA:  Chest pain for 2 hours EXAM: PORTABLE CHEST 1 VIEW COMPARISON:  11/08/2016 FINDINGS: Cardiomegaly again noted. No infiltrate or pleural effusion. No pulmonary edema. Bony thorax is stable. IMPRESSION: No active disease. Electronically Signed   By: Natasha Mead M.D.   On: 11/19/2016 14:03    EKG:   Orders placed or performed during the hospital encounter of 11/19/16  . ED EKG  . ED EKG  . EKG 12-Lead  . EKG 12-Lead  . ED EKG  . ED EKG    ASSESSMENT AND PLAN:   #1 life-threatening hyperkalemia: Due to missed hemodialysis sessions:Dialysis last night emergently however patient asked to be taken off treatment after less than 1 hour,now  Potasium  more than 7 this morning, Monitor on telemetry, emergency hemodialysis, spoke with Dr. Wynelle Link ,, patient is getting hemodialysis.also received a potassium shifting measures. Check potassium levels after hemodialysis today. Discussed with nurse.  #2 elevated troponins likely demand ischemia: Patient to on aspirin, metoprolol, nitroglycerin,consult cardio,had recent pericardial effusion drained on 1/9.had normal cath  In 12/17/  #3 diarrhea: Resolved, stool for C. difficile, stool cultures for parvovirus.  #4 history of bipolar disorder,; continue  present medication #5 hypothyroidism     All the records are reviewed and case discussed with Care Management/Social Workerr. Management plans discussed with the patient, family and they are in agreement.  CODE STATUS: full  TOTAL TIME TAKING CARE OF THIS PATIENT:35 minutes.   POSSIBLE D/C IN 1-2 DAYS, DEPENDING ON CLINICAL CONDITION.   Katha Hamming M.D on 11/20/2016 at 12:48 PM  Between 7am to 6pm - Pager - [phone]  After 6pm go to  [email or web address] - password [email or web address]  [facility]  Office  [phone]  CC: Primary care physician; [facility] PRIMARY CARE   Note: This dictation was prepared with Dragon dictation along with smaller phrase technology. Any transcriptional errors that result from this process are unintentional.

## 2016-11-20 NOTE — Progress Notes (Signed)
HD COMPLETED  

## 2016-11-20 NOTE — Progress Notes (Signed)
HD STARTED  

## 2016-11-20 NOTE — Progress Notes (Signed)
Pt refusing some meds; Dr. Konidena paged and orders received w/ return phone call.  Refusing 2400 mg renvela; states she takes "only 1 tab!" Order to change dose to 1 tab (800mg).  Refusing NTG & metoprolol (held anyway b/c sbp 110), refusing asa and helparin.  Requesting nicotine patch.  See new orders. 

## 2016-11-20 NOTE — Progress Notes (Signed)
Per tele tech, SR 60s; IV w/ blood return, flushed well, Stat meds given slowly, IV flushed X2.Pt tolerated well.

## 2016-11-20 NOTE — Progress Notes (Signed)
No bm today, no stool for c-diff toxin pcr.

## 2016-11-20 NOTE — Progress Notes (Signed)
Pt continues to refuse renvela after order changed to 1 tab

## 2016-11-20 NOTE — Progress Notes (Signed)
POST DIALYSIS ASSESSMENT 

## 2016-11-20 NOTE — Progress Notes (Addendum)
Need telebox prior to admin of Ca Chloride/Na Bicard; nsg supervisor notified

## 2016-11-20 NOTE — Progress Notes (Signed)
Pre dialysis assessment 

## 2016-11-20 NOTE — Progress Notes (Signed)
Patient alert and oriented; c/o of HA and generalized pain throughout shift; had dialysis ince admitted to floor; cbg checked as ordered; no s/s signs of hypo/ hypoglycemia after admitted to floor.
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
## 2016-11-21 LAB — GLUCOSE, CAPILLARY
GLUCOSE-CAPILLARY: 83 mg/dL (ref 65–99)
GLUCOSE-CAPILLARY: 111 mg/dL — AB (ref 65–99)
GLUCOSE-CAPILLARY: 95 mg/dL (ref 65–99)
Glucose-Capillary: 96 mg/dL (ref 65–99)

## 2016-11-21 MED ORDER — DIPHENHYDRAMINE HCL 25 MG PO CAPS
25.0000 mg | ORAL_CAPSULE | Freq: Four times a day (QID) | ORAL | Status: DC | PRN
Start: 2016-11-21 — End: 2016-11-24
  Administered 2016-11-21 – 2016-11-22 (×5): 25 mg via ORAL
  Filled 2016-11-21 (×5): qty 1

## 2016-11-21 NOTE — Evaluation (Signed)
Physical Therapy Evaluation Patient Details Name: Erin [name] MRN: [id number] DOB: [birth year] Today's Date: 11/21/2016   History of Present Illness  Pt is a 46 y/o F who presented with L upper chest pain, nausea, diarrhea.  Pt with hyperkalemia due to missed dialysis session.  Pt's PMH includes blidness, bipolar affect, chronic headaches, chornic upper back pain, epilepsy, DVT, stroke, brain surgery, breast lumpectomy.    Clinical Impression  Pt admitted with above diagnosis. Pt currently with functional limitations due to the deficits listed below (see PT Problem List). Ms. Rath reports generalized weakness and BLE pain that is new.  PTA pt was ambulating in her home without AD but reports 3 falls in the past 6 months.  She has an aide who assists with bathing, dressing, but is alone for most of the day and night.  She currently requires min assist for safe sit<>stand transfers and short distance ambulation due to instability.  She politely refuses to ambulate farther than 25 ft due to pain in BLEs and fatigue.  Given pt's current mobility status, her fall history, and the fact that she lives alone, recommending SNF at d/c. Pt will benefit from skilled PT to increase their independence and safety with mobility to allow discharge to the venue listed below.      Follow Up Recommendations SNF    Equipment Recommendations  None recommended by PT    Recommendations for Other Services       Precautions / Restrictions Precautions Precautions: Fall;Other (comment) Precaution Comments: Blind Restrictions Weight Bearing Restrictions: No      Mobility  Bed Mobility Overal bed mobility: Needs Assistance Bed Mobility: Supine to Sit     Supine to sit: Min guard;HOB elevated     General bed mobility comments: Increased time and effort and pt reports pain in BLEs.  Transfers Overall transfer level: Needs assistance Equipment used: 1 person hand held assist Transfers: Sit  to/from Stand Sit to Stand: Min assist         General transfer comment: Pt requests 1 person HHA due to instability and is slow to stand.  Directional cues provided as pt prepares to sit.  Ambulation/Gait Ambulation/Gait assistance: Min assist Ambulation Distance (Feet): 25 Feet Assistive device: None Gait Pattern/deviations: Decreased stride length Gait velocity: decreased Gait velocity interpretation: Below normal speed for age/gender General Gait Details: Min assist provided due to instability in addition to directional cues navigating around room.  Pt politely refuses ambulating farther due to pain in BLEs with mobility.  Stairs            Wheelchair Mobility    Modified Rankin (Stroke Patients Only)       Balance Overall balance assessment: Needs assistance;History of Falls Sitting-balance support: No upper extremity supported;Feet supported Sitting balance-Leahy Scale: Good     Standing balance support: No upper extremity supported;During functional activity Standing balance-Leahy Scale: Fair Standing balance comment: Steady with static activities, min assist for dynamic activities                             Pertinent Vitals/Pain Pain Assessment: 0-10 Pain Score: 10-Worst pain ever Pain Location: Posterior knee and anterior thighs Pain Descriptors / Indicators: Aching Pain Intervention(s): Limited activity within patient's tolerance;Monitored during session;Repositioned    Home Living Family/patient expects to be discharged to:: Skilled nursing facility Living Arrangements: Alone Available Help at Discharge: Friend(s);Available PRN/intermittently Type of Home: Apartment Home Access: Level entry  Home Layout: One level Home Equipment: Grab bars - toilet;Grab bars - tub/shower;Tub bench;Hand held shower head;Cane - single point      Prior Function Level of Independence: Needs assistance   Gait / Transfers Assistance Needed: Pt  reports she does not use AD in her home as she knows the layout.  She has had 3 falls in the past 6 months.  2 of which occurred when getting OOB and tripping on her shoes.  If out in community she will hold onto someone's arm and use a cane to locate items in fronto f her.  ADL's / Homemaking Assistance Needed: Aide 3x/wk for bathing, donning pants, as well as assist for transportation.  Pt able to cook independently.        Hand Dominance   Dominant Hand: Right    Extremity/Trunk Assessment   Upper Extremity Assessment Upper Extremity Assessment: RUE deficits/detail;LUE deficits/detail RUE Deficits / Details: Strength grossly 3-/5 LUE Deficits / Details: Strength grossly 2+/5    Lower Extremity Assessment Lower Extremity Assessment: RLE deficits/detail;LLE deficits/detail RLE Deficits / Details: Knee flexion and hip flexion 3/5 LLE Deficits / Details: Knee flexion and hip flexion 3/5, knee extension 4/5       Communication   Communication: No difficulties  Cognition Arousal/Alertness: Awake/alert Behavior During Therapy: WFL for tasks assessed/performed Overall Cognitive Status: Within Functional Limits for tasks assessed                      General Comments      Exercises     Assessment/Plan    PT Assessment Patient needs continued PT services  PT Problem List Decreased strength;Pain;Decreased balance;Decreased activity tolerance;Decreased safety awareness          PT Treatment Interventions DME instruction;Gait training;Functional mobility training;Therapeutic activities;Therapeutic exercise;Balance training;Patient/family education    PT Goals (Current goals can be found in the Care Plan section)  Acute Rehab PT Goals Patient Stated Goal: rehab before home PT Goal Formulation: With patient Time For Goal Achievement: 12/05/16 Potential to Achieve Goals: Good    Frequency Min 2X/week   Barriers to discharge Decreased caregiver support No assist  available at home    Co-evaluation               End of Session Equipment Utilized During Treatment: Gait belt Activity Tolerance: Patient limited by pain;Patient limited by fatigue Patient left: in chair;with call bell/phone within reach;with chair alarm set Nurse Communication: Mobility status;Other (comment) (pt's report of 10/10 pain)         Time: 8875-7972 PT Time Calculation (min) (ACUTE ONLY): 23 min   Charges:   PT Evaluation $PT Eval Low Complexity: 1 Procedure PT Treatments $Therapeutic Activity: 8-22 mins   PT G Codes:        Encarnacion Chu PT, DPT 11/21/2016, 1:45 PM

## 2016-11-21 NOTE — Progress Notes (Signed)
Patient continue to refuse certain meds such as Heparin SQ, Metoprolol, and Nitroglycerin

## 2016-11-21 NOTE — Discharge Instructions (Addendum)
Renal  Diet Activity as tolerated with assistance

## 2016-11-21 NOTE — Progress Notes (Signed)
Notified Dr. Elpidio Anis to ask to check patient's blood sugars more frequently due to 2 low blood sugars from the 2 days prior plus patient states that her sugars can drop dramatically at different times.  He stated that CBGs can be checked q4 hrs.

## 2016-11-21 NOTE — Progress Notes (Signed)
[facility]  ROUNDING NOTE   Subjective:   Erin [name] admitted to [facility] on 11/19/2016 for Shortness of breath [R06.02] Hyperkalemia [E87.5] Hypoglycemia [E16.2] Pain [R52] Chest pain [R07.9] Non-ST elevation myocardial infarction (NSTEMI), type 2 (HCC) [I21.4] Chest pain, unspecified type [R07.9]    doing better No SOB Still reports chest discomfort b/l Diarrhea is resolved Able to eat without nausea and vomiting  Objective:  Vital signs in last 24 hours:  Temp:  [98.1 F (36.7 C)-98.4 F (36.9 C)] 98.4 F (36.9 C) (01/22 1204) Pulse Rate:  [68-74] 74 (01/22 1204) Resp:  [17-20] 17 (01/22 0825) BP: (102-132)/(58-77) 102/59 (01/22 1204) SpO2:  [97 %-99 %] 98 % (01/22 1204)  Weight change: 7.861 kg (17 lb 5.3 oz) Filed Weights   11/19/16 2035 11/20/16 1040 11/20/16 1415  Weight: 74.6 kg (164 lb 7.4 oz) 75.9 kg (167 lb 5.3 oz) 74.5 kg (164 lb 3.9 oz)    Intake/Output: I/O last 3 completed shifts: In: 948 [P.O.:948] Out: 2850 [Urine:1000; Other:1850]   Intake/Output this shift:  Total I/O In: -  Out: 400 [Urine:400]  Physical Exam: General: NAD, laying in bed  Head: Normocephalic, atraumatic.   ENT   Moist oral mucosal membranes  Neck: Supple, trachea midline  Lungs:  Clear to auscultation  Heart: Regular rate and rhythm  Abdomen:  Soft, nontender,   Extremities: trace peripheral edema.  Neurologic: Nonfocal, moving all four extremities  Skin: No lesions  Access: Right arm AVG    Basic Metabolic Panel:  Recent Labs Lab 11/19/16 1513 11/19/16 2101 11/20/16 0614 11/20/16 1402  NA 129* 135 133*  --   K 6.4* 4.7 7.3* 3.9  CL 96* 101 99*  --   CO2 23 25 27   --   GLUCOSE 68 97 67  --   BUN 41* 30* 30*  --   CREATININE 5.78* 4.32* 4.68*  --   CALCIUM 7.7* 7.3* 7.4*  --   PHOS  --  3.7  --   --     Liver Function Tests:  Recent Labs Lab 11/19/16 2101  ALBUMIN 2.8*   No results for input(s): LIPASE, AMYLASE in the last  168 hours. No results for input(s): AMMONIA in the last 168 hours.  CBC:  Recent Labs Lab 11/19/16 1513 11/19/16 2101 11/20/16 0614  WBC 8.1 4.8 6.0  NEUTROABS 5.1  --   --   HGB 12.1 10.3* 10.8*  HCT 36.1 30.6* 32.1*  MCV 93.2 91.1 93.0  PLT 278 235 224    Cardiac Enzymes:  Recent Labs Lab 11/19/16 1513 11/19/16 2101 11/20/16 0051  TROPONINI 0.88* 0.80* 0.78*    BNP: Invalid input(s): POCBNP  CBG:  Recent Labs Lab 11/19/16 1953 11/20/16 0639 11/20/16 0825 11/21/16 0823 11/21/16 1139  GLUCAP 87 75 94 83 111*    Microbiology: Results for orders placed or performed during the hospital encounter of 11/08/16  Surgical pcr screen     Status: Abnormal   Collection Time: 11/08/16  1:43 PM  Result Value Ref Range Status   MRSA, PCR NEGATIVE NEGATIVE Final   Staphylococcus aureus POSITIVE (A) NEGATIVE Final    Comment:        The Xpert SA Assay (FDA approved for NASAL specimens in patients over 40 years of age), is one component of a comprehensive surveillance program.  Test performance has been validated by [facility] for patients greater than or equal to 8 year old. It is not intended to diagnose infection nor  to guide or monitor treatment.   Culture, body fluid-bottle     Status: None   Collection Time: 11/08/16  3:21 PM  Result Value Ref Range Status   Specimen Description PERICARDIAL  Final   Special Requests BOTTLES DRAWN AEROBIC AND ANAEROBIC 10CC  Final   Culture NO GROWTH 5 DAYS  Final   Report Status 11/13/2016 FINAL  Final  Gram stain     Status: None   Collection Time: 11/08/16  3:21 PM  Result Value Ref Range Status   Specimen Description PERICARDIAL  Final   Special Requests NONE  Final   Gram Stain   Final    ABUNDANT WBC PRESENT, PREDOMINANTLY PMN NO ORGANISMS SEEN    Report Status 11/08/2016 FINAL  Final  Acid Fast Smear (AFB)     Status: None   Collection Time: 11/08/16  3:21 PM  Result Value Ref Range Status   AFB Specimen  Processing Concentration  Final   Acid Fast Smear Negative  Final    Comment: (NOTE) Performed At: [facility] [address] [place], [place] [id number] Mila Homer MD WU:[id number]    Source (AFB) FLUID  Final    Comment: PERICARDIAL    Coagulation Studies: No results for input(s): LABPROT, INR in the last 72 hours.  Urinalysis: No results for input(s): COLORURINE, LABSPEC, PHURINE, GLUCOSEU, HGBUR, BILIRUBINUR, KETONESUR, PROTEINUR, UROBILINOGEN, NITRITE, LEUKOCYTESUR in the last 72 hours.  Invalid input(s): APPERANCEUR    Imaging: Dg Abd 1 View  Result Date: 11/19/2016 CLINICAL DATA:  Lower abdominal pain and cramping.  Nausea. EXAM: ABDOMEN - 1 VIEW COMPARISON:  11/08/2016 FINDINGS: The bowel gas pattern is normal. Right upper quadrant surgical clips from prior cholecystectomy. No radio-opaque calculi or other significant radiographic abnormality are seen. IMPRESSION: Normal bowel gas pattern.  No acute findings. Electronically Signed   By: Myles Rosenthal M.D.   On: 11/19/2016 19:34     Medications:    . albuterol  5 mg Nebulization Once  . aspirin EC  81 mg Oral Daily  . dextrose  1 ampule Intravenous Once  . fenofibrate  160 mg Oral Daily  . fludrocortisone  0.1 mg Oral Daily  . furosemide  60 mg Intravenous Once  . furosemide  40 mg Oral BID  . gabapentin  400 mg Oral QHS  . heparin  5,000 Units Subcutaneous Q8H  . hydrocortisone  10 mg Oral Daily   And  . hydrocortisone  5 mg Oral QHS  . levothyroxine  100 mcg Oral QAC breakfast  . loratadine  10 mg Oral Daily  . Melatonin  2 tablet Oral QHS  . metoprolol tartrate  25 mg Oral BID  . nicotine  14 mg Transdermal Daily  . nitroGLYCERIN  0.5 inch Topical Q6H  . nystatin ointment  1 application Topical BID  . pantoprazole  40 mg Oral Daily  . pravastatin  20 mg Oral q1800  . risperidone  2-4 mg Oral BID  . sertraline  50 mg Oral Daily  . sevelamer carbonate  800 mg Oral BID WC  . sodium chloride  flush  3 mL Intravenous Q12H  . topiramate  250 mg Oral BID  . traZODone  100 mg Oral QHS  . [START ON 12/08/2016] Vitamin D (Ergocalciferol)  50,000 Units Oral Q30 days   sodium chloride, sodium chloride, acetaminophen **OR** acetaminophen, alteplase, diphenhydrAMINE, heparin, HYDROmorphone (DILAUDID) injection, lidocaine (PF), lidocaine-prilocaine, ondansetron **OR** ondansetron (ZOFRAN) IV, ondansetron (ZOFRAN) IV, ondansetron, oxyCODONE, pentafluoroprop-tetrafluoroeth  Assessment/ Plan:  Ms. LENELL MCCONNELL is a 46 y.o. white female with End stage renal disease on hemodialysis, blindness, hypertension, diabetes mellitus type II, hyperlipidemia  TTS [facility] right arm AVG  1. End Stage Renal Disease with hyperkalemia: and noncompliance.  - Emergent hemodialysis last night but patient refused treatment. Now requiring emergent dialysis less than 12 hours later due to hyperkalemia  - Monitor for dialysis need daily. Resume TTS schedule.    2. Hypotension: blood pressure at goal last night.  - on hydrocortisone and fludrocortisone..   3. Anemia of chronic kidney disease: hemoglobin 10.8 - hold epo due to thrombosis  4. Secondary Hyperparathyroidism: outpatient PTH 645, phosphorus 3.8 and calcium 7.7 (corrected 8.3) - Continue sevelamer with meals.     LOS: 2 Charvez Voorhies 1/22/20184:34 PM

## 2016-11-21 NOTE — Progress Notes (Signed)
Patient is a dialysis patient and complains of itching. Notified on-call hospitalist. Verbal order given for PRN Benadryl 25mg . Administered Benadryl per PRN order.

## 2016-11-21 NOTE — Progress Notes (Signed)
[facility] - [place] at [facility]   PATIENT NAME: Erin [name]    MR#:  [id number]  DATE OF BIRTH:  Mar 18, 1971  SUBJECTIVE:   CHIEF COMPLAINT:   Chief Complaint  Patient presents with  . Chest Pain   No further vomiting/diarrhea  REVIEW OF SYSTEMS:   ROS CONSTITUTIONAL: No fever, fatigue or weakness.  EYES: No blurred or double vision.  EARS, NOSE, AND THROAT: No tinnitus or ear pain.  RESPIRATORY: No cough, shortness of breath, wheezing or hemoptysis.  CARDIOVASCULAR: No chest pain, orthopnea, edema.  GASTROINTESTINAL: No nausea, vomiting, diarrhea or abdominal pain.  GENITOURINARY: No dysuria, hematuria.  ENDOCRINE: No polyuria, nocturia,  HEMATOLOGY: No anemia, easy bruising or bleeding SKIN: No rash or lesion. MUSCULOSKELETAL: No joint pain or arthritis.   NEUROLOGIC: No tingling, numbness, weakness.  PSYCHIATRY: No anxiety or depression.   DRUG ALLERGIES:   Allergies  Allergen Reactions  . Dhea [Nutritional Supplements] Anaphylaxis    Patient states medication is DHE for migraines, not DHEA  . Demerol [Meperidine] Other (See Comments)    Reaction:  Hallucinations   . Floxin [Ofloxacin] Other (See Comments)    Reaction:  Hallucinations   . Nsaids Nausea And Vomiting and Swelling  . Nubain [Nalbuphine Hcl] Other (See Comments)    Reaction:  Hallucinations   . Phenergan [Promethazine Hcl] Other (See Comments)    Reaction:  Restless legs   . Stadol [Butorphanol] Other (See Comments)    Reaction:  Hallucinations   . Erythromycin Diarrhea and Rash    VITALS:  Blood pressure (!) 102/59, pulse 74, temperature 98.4 F (36.9 C), temperature source Oral, resp. rate 17, height 4\' 10"  (1.473 m), weight 74.5 kg (164 lb 3.9 oz), last menstrual period 11/21/2007, SpO2 98 %.  PHYSICAL EXAMINATION:  GENERAL:  46 y.o.-year-old patient lying in the bed with no acute distress.  EYES: Pupils equal, round, reactive to light and accommodation. No  scleral icterus. Extraocular muscles intact.  HEENT: Head atraumatic, normocephalic. Oropharynx and nasopharynx clear.  NECK:  Supple, no jugular venous distention. No thyroid enlargement, no tenderness.  LUNGS: Normal breath sounds bilaterally, no wheezing, rales,rhonchi or crepitation. No use of accessory muscles of respiration.  CARDIOVASCULAR: S1, S2 normal. No murmurs, rubs, or gallops.  ABDOMEN: Soft, nontender, nondistended. Bowel sounds present. No organomegaly or mass.  EXTREMITIES: No pedal edema, cyanosis, or clubbing.  NEUROLOGIC: Moves all 4 extremities.  PSYCHIATRIC: The patient is alert and awake. anxious SKIN: No obvious rash, lesion, or ulcer.   LABORATORY PANEL:   CBC  Recent Labs Lab 11/20/16 0614  WBC 6.0  HGB 10.8*  HCT 32.1*  PLT 224   ------------------------------------------------------------------------------------------------------------------  Chemistries   Recent Labs Lab 11/20/16 0614 11/20/16 1402  NA 133*  --   K 7.3* 3.9  CL 99*  --   CO2 27  --   GLUCOSE 67  --   BUN 30*  --   CREATININE 4.68*  --   CALCIUM 7.4*  --    ------------------------------------------------------------------------------------------------------------------  Cardiac Enzymes  Recent Labs Lab 11/20/16 0051  TROPONINI 0.78*   ------------------------------------------------------------------------------------------------------------------  RADIOLOGY:  Dg Abd 1 View  Result Date: 11/19/2016 CLINICAL DATA:  Lower abdominal pain and cramping.  Nausea. EXAM: ABDOMEN - 1 VIEW COMPARISON:  11/08/2016 FINDINGS: The bowel gas pattern is normal. Right upper quadrant surgical clips from prior cholecystectomy. No radio-opaque calculi or other significant radiographic abnormality are seen. IMPRESSION: Normal bowel gas pattern.  No acute findings. Electronically Signed  By: Myles Rosenthal M.D.   On: 11/19/2016 19:34    EKG:   Orders placed or performed during the  hospital encounter of 11/19/16  . EKG 12-Lead  . EKG 12-Lead  . ED EKG  . ED EKG    ASSESSMENT AND PLAN:   # Hyperkalemia due to missing HD Now normal Advised to be compliant  # Elevated troponins likely demand ischemia: Patient to on aspirin, metoprolol, nitroglycerin, had recent pericardial effusion drained on 1/9. had normal cath  In 12/17  #3 diarrhea: Resolved Likely acute gastroenteritis  #4 history of bipolar disorder  continue  present medication  #5 hypothyroidism  All the records are reviewed and case discussed with Care Management/Social Workerr. Management plans discussed with the patient, family and they are in agreement.  CODE STATUS: full  TOTAL TIME TAKING CARE OF THIS PATIENT:35 minutes.   POSSIBLE D/C IN 1-2 DAYS, DEPENDING ON CLINICAL CONDITION to SNF   Milagros Loll R M.D on 11/21/2016 at 2:40 PM  Between 7am to 6pm - Pager - [phone]  After 6pm go to [email or web address] - password [email or web address]  [facility]  Office  [phone]  CC: Primary care physician; [facility] PRIMARY CARE   Note: This dictation was prepared with Dragon dictation along with smaller phrase technology. Any transcriptional errors that result from this process are unintentional.

## 2016-11-22 LAB — GLUCOSE, CAPILLARY
GLUCOSE-CAPILLARY: 72 mg/dL (ref 65–99)
GLUCOSE-CAPILLARY: 75 mg/dL (ref 65–99)
GLUCOSE-CAPILLARY: 84 mg/dL (ref 65–99)
GLUCOSE-CAPILLARY: 84 mg/dL (ref 65–99)
Glucose-Capillary: 83 mg/dL (ref 65–99)
Glucose-Capillary: 114 mg/dL — ABNORMAL HIGH (ref 65–99)
Glucose-Capillary: 80 mg/dL (ref 65–99)
Glucose-Capillary: 104 mg/dL — ABNORMAL HIGH (ref 65–99)
Glucose-Capillary: 93 mg/dL (ref 65–99)

## 2016-11-22 MED ORDER — CALCIUM CARBONATE ANTACID 500 MG PO CHEW
500.0000 mg | CHEWABLE_TABLET | Freq: Two times a day (BID) | ORAL | Status: DC
  Administered 2016-11-24: 500 mg via ORAL
  Filled 2016-11-22 (×3): qty 1

## 2016-11-22 NOTE — Progress Notes (Signed)
Post hd assessment 

## 2016-11-22 NOTE — NC FL2 (Signed)
MEDICAID FL2 LEVEL OF CARE SCREENING TOOL     IDENTIFICATION  Patient Name: Erin [name] Birthdate: Apr 15, 1971 Sex: female Admission Date (Current Location): 11/19/2016  [facility] and [place] Number:  Chiropodist and Address:  [facility], [address], [place], [place] [address]      Provider Number: [id number]  Attending Physician Name and Address:  Milagros Loll, MD  Relative Name and Phone Number:       Current Level of Care: Hospital Recommended Level of Care: Skilled Nursing Facility Prior Approval Number:    Date Approved/Denied:   PASRR Number:    Discharge Plan: SNF    Current Diagnoses: Patient Active Problem List   Diagnosis Date Noted  . Hyperkalemia 11/19/2016  . Elevated troponin 11/19/2016  . ESRD (end stage renal disease) (HCC) 11/19/2016  . Shock circulatory (HCC) 11/08/2016  . Cardiac/pericardial tamponade   . Palliative care encounter   . Goals of care, counseling/discussion   . Seizure disorder (HCC) 06/27/2016  . Blindness 06/27/2016  . Chronic pain 06/27/2016  . History of CVA (cerebrovascular accident) 06/27/2016  . Hypothyroidism 03/04/2016  . GERD (gastroesophageal reflux disease) 03/04/2016  . End stage renal disease on dialysis (HCC) 03/04/2016  . Bipolar 1 disorder, mixed, moderate (HCC) 02/26/2016  . 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 Anemia 02/25/2016    Orientation RESPIRATION BLADDER Height & Weight     Self, Time, Situation, Place  Normal Continent Weight: 153 lb (69.4 kg) Height:  4\' 10"  (147.3 cm)  BEHAVIORAL SYMPTOMS/MOOD NEUROLOGICAL BOWEL NUTRITION STATUS   (none) Convulsions/Seizures Continent Diet (regular)  AMBULATORY STATUS COMMUNICATION OF NEEDS Skin   Limited Assist Verbally Normal                       Personal Care Assistance Level of Assistance  Dressing, Bathing Bathing Assistance:  Limited assistance   Dressing Assistance: Limited assistance     Functional Limitations Info  Sight Sight Info: Impaired        SPECIAL CARE FACTORS FREQUENCY  PT (By licensed PT) (established hemodialysis)                    Contractures Contractures Info: Not present    Additional Factors Info  Code Status, Allergies Code Status Info: full Allergies Info: dhea           Current Medications (11/22/2016):  This is the current hospital active medication list Current Facility-Administered Medications  Medication Dose Route Frequency Provider Last Rate Last Dose  . 0.9 %  sodium chloride infusion  100 mL Intravenous PRN Sarath Kolluru, MD      . 0.9 %  sodium chloride infusion  100 mL Intravenous PRN Lamont Dowdy, MD      . acetaminophen (TYLENOL) tablet 650 mg  650 mg Oral Q6H PRN Katharina Caper, MD       Or  . acetaminophen (TYLENOL) suppository 650 mg  650 mg Rectal Q6H PRN Katharina Caper, MD      . albuterol (PROVENTIL) (2.5 MG/3ML) 0.083% nebulizer solution 5 mg  5 mg Nebulization Once [place], MD      . alteplase (CATHFLO ACTIVASE) injection 2 mg  2 mg Intracatheter Once PRN Lamont Dowdy, MD      . aspirin EC tablet 81 mg  81 mg Oral Daily Katharina Caper, MD   81 mg at 11/21/16 0956  . dextrose 50 %  solution 50 mL  1 ampule Intravenous Once Nita Sickle, MD   Stopped at 11/19/16 1607  . diphenhydrAMINE (BENADRYL) capsule 25 mg  25 mg Oral Q6H PRN Ihor Austin, MD   25 mg at 11/21/16 2342  . fenofibrate tablet 160 mg  160 mg Oral Daily Katharina Caper, MD   160 mg at 11/21/16 0956  . fludrocortisone (FLORINEF) tablet 0.1 mg  0.1 mg Oral Daily Katharina Caper, MD   0.1 mg at 11/22/16 0908  . furosemide (LASIX) injection 60 mg  60 mg Intravenous Once Katharina Caper, MD      . furosemide (LASIX) tablet 40 mg  40 mg Oral BID Katharina Caper, MD   40 mg at 11/22/16 0911  . gabapentin (NEURONTIN) capsule 400 mg  400 mg Oral QHS Katharina Caper, MD   400 mg at  11/21/16 2335  . heparin injection 1,000 Units  1,000 Units Dialysis PRN Lamont Dowdy, MD      . heparin injection 5,000 Units  5,000 Units Subcutaneous Q8H Katharina Caper, MD      . hydrocortisone (CORTEF) tablet 10 mg  10 mg Oral Daily Katharina Caper, MD   10 mg at 11/22/16 0910   And  . hydrocortisone (CORTEF) tablet 5 mg  5 mg Oral QHS Katharina Caper, MD   5 mg at 11/21/16 2347  . HYDROmorphone (DILAUDID) injection 1 mg  1 mg Intravenous Q4H PRN Katharina Caper, MD   1 mg at 11/22/16 0922  . levothyroxine (SYNTHROID, LEVOTHROID) tablet 100 mcg  100 mcg Oral QAC breakfast Katharina Caper, MD   100 mcg at 11/22/16 0912  . lidocaine (PF) (XYLOCAINE) 1 % injection 5 mL  5 mL Intradermal PRN Lamont Dowdy, MD      . lidocaine-prilocaine (EMLA) cream 1 application  1 application Topical PRN Lamont Dowdy, MD      . loratadine (CLARITIN) tablet 10 mg  10 mg Oral Daily Katharina Caper, MD   10 mg at 11/22/16 0909  . Melatonin TABS 10 mg  2 tablet Oral QHS Katharina Caper, MD   10 mg at 11/21/16 2339  . metoprolol tartrate (LOPRESSOR) tablet 25 mg  25 mg Oral BID Katharina Caper, MD   25 mg at 11/21/16 0956  . nicotine (NICODERM CQ - dosed in mg/24 hours) patch 14 mg  14 mg Transdermal Daily Katha Hamming, MD   14 mg at 11/22/16 0912  . nitroGLYCERIN (NITROGLYN) 2 % ointment 0.5 inch  0.5 inch Topical Q6H Katharina Caper, MD      . nystatin ointment (MYCOSTATIN) 1 application  1 application Topical BID Katharina Caper, MD   1 application at 11/21/16 2340  . ondansetron (ZOFRAN) tablet 4 mg  4 mg Oral Q6H PRN Katharina Caper, MD       Or  . ondansetron (ZOFRAN) injection 4 mg  4 mg Intravenous Q6H PRN Katharina Caper, MD      . ondansetron (ZOFRAN) injection 4 mg  4 mg Intravenous Q6H PRN Katharina Caper, MD   4 mg at 11/20/16 1610  . ondansetron (ZOFRAN) tablet 4 mg  4 mg Oral Q8H PRN Katharina Caper, MD      . oxyCODONE (Oxy IR/ROXICODONE) immediate release tablet 5 mg  5 mg Oral Q6H PRN Katharina Caper, MD   5 mg  at 11/21/16 1508  . pantoprazole (PROTONIX) EC tablet 40 mg  40 mg Oral Daily Katharina Caper, MD   40 mg at 11/22/16 0907  . pentafluoroprop-tetrafluoroeth (GEBAUERS) aerosol 1 application  1  application Topical PRN Lamont Dowdy, MD      . pravastatin (PRAVACHOL) tablet 20 mg  20 mg Oral q1800 Katharina Caper, MD   20 mg at 11/21/16 1737  . risperiDONE (RISPERDAL) tablet 2-4 mg  2-4 mg Oral BID Katharina Caper, MD   4 mg at 11/22/16 0908  . sertraline (ZOLOFT) tablet 50 mg  50 mg Oral Daily Katharina Caper, MD   50 mg at 11/22/16 1000  . sevelamer carbonate (RENVELA) tablet 800 mg  800 mg Oral BID WC Katha Hamming, MD   800 mg at 11/22/16 0905  . sodium chloride flush (NS) 0.9 % injection 3 mL  3 mL Intravenous Q12H Katharina Caper, MD   3 mL at 11/22/16 0933  . topiramate (TOPAMAX) tablet 250 mg  250 mg Oral BID Katharina Caper, MD   250 mg at 11/22/16 0905  . traZODone (DESYREL) tablet 100 mg  100 mg Oral QHS Katharina Caper, MD   100 mg at 11/21/16 2342  . [START ON 12/08/2016] Vitamin D (Ergocalciferol) (DRISDOL) capsule 50,000 Units  50,000 Units Oral Q30 days Katharina Caper, MD         Discharge Medications: Please see discharge summary for a list of discharge medications.  Relevant Imaging Results:  Relevant Lab Results:   Additional Information ss: [id number]  York Spaniel, LCSW

## 2016-11-22 NOTE — Progress Notes (Signed)
Patient continue to refuse meds - Metoprolol, Heparin SQ, and Nitroglycerin.

## 2016-11-22 NOTE — Progress Notes (Signed)
[facility] - [place] at [facility]   PATIENT NAME: Erin [name]    MR#:  [id number]  DATE OF BIRTH:  1971-08-04  SUBJECTIVE:   CHIEF COMPLAINT:   Chief Complaint  Patient presents with  . Chest Pain   No further vomiting/diarrhea. No abd pain  REVIEW OF SYSTEMS:   ROS CONSTITUTIONAL: No fever, fatigue or weakness.  EYES: No blurred or double vision.  EARS, NOSE, AND THROAT: No tinnitus or ear pain.  RESPIRATORY: No cough, shortness of breath, wheezing or hemoptysis.  CARDIOVASCULAR: No chest pain, orthopnea, edema.  GASTROINTESTINAL: No nausea, vomiting, diarrhea or abdominal pain.  GENITOURINARY: No dysuria, hematuria.  ENDOCRINE: No polyuria, nocturia,  HEMATOLOGY: No anemia, easy bruising or bleeding SKIN: No rash or lesion. MUSCULOSKELETAL: No joint pain or arthritis.   NEUROLOGIC: No tingling, numbness, weakness.  PSYCHIATRY: No anxiety or depression.   DRUG ALLERGIES:   Allergies  Allergen Reactions  . Dhea [Nutritional Supplements] Anaphylaxis    Patient states medication is DHE for migraines, not DHEA  . Demerol [Meperidine] Other (See Comments)    Reaction:  Hallucinations   . Floxin [Ofloxacin] Other (See Comments)    Reaction:  Hallucinations   . Nsaids Nausea And Vomiting and Swelling  . Nubain [Nalbuphine Hcl] Other (See Comments)    Reaction:  Hallucinations   . Phenergan [Promethazine Hcl] Other (See Comments)    Reaction:  Restless legs   . Stadol [Butorphanol] Other (See Comments)    Reaction:  Hallucinations   . Erythromycin Diarrhea and Rash    VITALS:  Blood pressure (!) 165/74, pulse 61, temperature 97.7 F (36.5 C), temperature source Oral, resp. rate 20, height 4\' 10"  (1.473 m), weight 69.4 kg (153 lb), last menstrual period 11/21/2007, SpO2 94 %.  PHYSICAL EXAMINATION:  GENERAL:  46 y.o.-year-old patient lying in the bed with no acute distress.  EYES: Pupils equal, round, reactive to light and  accommodation. No scleral icterus. Extraocular muscles intact.  HEENT: Head atraumatic, normocephalic. Oropharynx and nasopharynx clear.  NECK:  Supple, no jugular venous distention. No thyroid enlargement, no tenderness.  LUNGS: Normal breath sounds bilaterally, no wheezing, rales,rhonchi or crepitation. No use of accessory muscles of respiration.  CARDIOVASCULAR: S1, S2 normal. No murmurs, rubs, or gallops.  ABDOMEN: Soft, nontender, nondistended. Bowel sounds present. No organomegaly or mass.  EXTREMITIES: No pedal edema, cyanosis, or clubbing.  NEUROLOGIC: Moves all 4 extremities.  PSYCHIATRIC: The patient is alert and awake. anxious SKIN: No obvious rash, lesion, or ulcer.   LABORATORY PANEL:   CBC  Recent Labs Lab 11/20/16 0614  WBC 6.0  HGB 10.8*  HCT 32.1*  PLT 224   ------------------------------------------------------------------------------------------------------------------  Chemistries   Recent Labs Lab 11/20/16 0614 11/20/16 1402  NA 133*  --   K 7.3* 3.9  CL 99*  --   CO2 27  --   GLUCOSE 67  --   BUN 30*  --   CREATININE 4.68*  --   CALCIUM 7.4*  --    ------------------------------------------------------------------------------------------------------------------  Cardiac Enzymes  Recent Labs Lab 11/20/16 0051  TROPONINI 0.78*   ------------------------------------------------------------------------------------------------------------------  RADIOLOGY:  No results found.  EKG:   Orders placed or performed during the hospital encounter of 11/19/16  . EKG 12-Lead  . EKG 12-Lead  . ED EKG  . ED EKG    ASSESSMENT AND PLAN:   # Hyperkalemia due to missing HD Now normal Advised to be compliant Discussed with Dr. Thedore Mins of nephrology  # Elevated troponins  likely demand ischemia: Patient to on aspirin, metoprolol, nitroglycerin, had recent pericardial effusion drained on 1/9. had normal cath  In 12/17  #3 diarrhea:  Resolved Likely acute gastroenteritis  #4 history of bipolar disorder  continue present medication  #5 hypothyroidism  Needs SNF at discharge  CODE STATUS: full  TOTAL TIME TAKING CARE OF THIS PATIENT:35 minutes.   POSSIBLE D/C IN 1-2 DAYS, DEPENDING ON CLINICAL CONDITION to SNF   Milagros Loll R M.D on 11/22/2016 at 2:42 PM  Between 7am to 6pm - Pager - [phone]  After 6pm go to [email or web address] - password [email or web address]  [facility]  Office  [phone]  CC: Primary care physician; [facility] PRIMARY CARE   Note: This dictation was prepared with Dragon dictation along with smaller phrase technology. Any transcriptional errors that result from this process are unintentional.

## 2016-11-22 NOTE — Plan of Care (Signed)
Problem: Pain Managment: Goal: General experience of comfort will improve Outcome: Progressing Educated patient on the need to reposition for comfort

## 2016-11-22 NOTE — Progress Notes (Signed)
Hd start 

## 2016-11-22 NOTE — Progress Notes (Signed)
[facility]  ROUNDING NOTE   Subjective:   [name] admitted to [facility] on 11/19/2016 for Shortness of breath [R06.02] Hyperkalemia [E87.5] Hypoglycemia [E16.2] Pain [R52] Chest pain [R07.9] Non-ST elevation myocardial infarction (NSTEMI), type 2 (HCC) [I21.4] Chest pain, unspecified type [R07.9]   Patient is feeling better. Denies shortness of breath, pain, nausea, vomiting.  Appetite improved. Dialysis completed earlier today, 2.5L fluid removed.  Objective:  Vital signs in last 24 hours:  Temp:  [97.7 F (36.5 C)-98.3 F (36.8 C)] 97.7 F (36.5 C) (01/23 1245) Pulse Rate:  [49-89] 61 (01/23 1250) Resp:  [8-20] 20 (01/23 1250) BP: (112-165)/(52-91) 165/74 (01/23 1250) SpO2:  [94 %-100 %] 94 % (01/23 1250) Weight:  [69.4 kg (153 lb)-71.5 kg (157 lb 10.1 oz)] 69.4 kg (153 lb) (01/23 1245)  Weight change:  Filed Weights   11/20/16 1415 11/22/16 0940 11/22/16 1245  Weight: 74.5 kg (164 lb 3.9 oz) 71.5 kg (157 lb 10.1 oz) 69.4 kg (153 lb)    Intake/Output: I/O last 3 completed shifts: In: 472 [P.O.:472] Out: 900 [Urine:900]   Intake/Output this shift:  Total I/O In: 248 [P.O.:240; Other:8] Out: 2700 [Urine:200; Other:2500]  Physical Exam: General: NAD, laying in bed  Head: Normocephalic, atraumatic.   ENT   Moist oral mucosal membranes  Neck: Supple, trachea midline  Lungs:  Clear to auscultation  Heart: Regular rate and rhythm  Abdomen:  Soft, nontender,   Extremities: trace peripheral edema.  Neurologic: Nonfocal, moving all four extremities  Skin: No lesions  Access: AVG    Basic Metabolic Panel:  Recent Labs Lab 11/19/16 1513 11/19/16 2101 11/20/16 0614 11/20/16 1402  NA 129* 135 133*  --   K 6.4* 4.7 7.3* 3.9  CL 96* 101 99*  --   CO2 23 25 27   --   GLUCOSE 68 97 67  --   BUN 41* 30* 30*  --   CREATININE 5.78* 4.32* 4.68*  --   CALCIUM 7.7* 7.3* 7.4*  --   PHOS  --  3.7  --   --     Liver Function Tests:  Recent  Labs Lab 11/19/16 2101  ALBUMIN 2.8*   No results for input(s): LIPASE, AMYLASE in the last 168 hours. No results for input(s): AMMONIA in the last 168 hours.  CBC:  Recent Labs Lab 11/19/16 1513 11/19/16 2101 11/20/16 0614  WBC 8.1 4.8 6.0  NEUTROABS 5.1  --   --   HGB 12.1 10.3* 10.8*  HCT 36.1 30.6* 32.1*  MCV 93.2 91.1 93.0  PLT 278 235 224    Cardiac Enzymes:  Recent Labs Lab 11/19/16 1513 11/19/16 2101 11/20/16 0051  TROPONINI 0.88* 0.80* 0.78*    BNP: Invalid input(s): POCBNP  CBG:  Recent Labs Lab 11/22/16 0417 11/22/16 0715 11/22/16 0808 11/22/16 1240 11/22/16 1304  GLUCAP 72 75 114* 84 80    Microbiology: Results for orders placed or performed during the hospital encounter of 11/08/16  Surgical pcr screen     Status: Abnormal   Collection Time: 11/08/16  1:43 PM  Result Value Ref Range Status   MRSA, PCR NEGATIVE NEGATIVE Final   Staphylococcus aureus POSITIVE (A) NEGATIVE Final    Comment:        The Xpert SA Assay (FDA approved for NASAL specimens in patients over 53 years of age), is one component of a comprehensive surveillance program.  Test performance has been validated by [facility] for patients greater than or equal to 1  year old. It is not intended to diagnose infection nor to guide or monitor treatment.   Culture, body fluid-bottle     Status: None   Collection Time: 11/08/16  3:21 PM  Result Value Ref Range Status   Specimen Description PERICARDIAL  Final   Special Requests BOTTLES DRAWN AEROBIC AND ANAEROBIC 10CC  Final   Culture NO GROWTH 5 DAYS  Final   Report Status 11/13/2016 FINAL  Final  Gram stain     Status: None   Collection Time: 11/08/16  3:21 PM  Result Value Ref Range Status   Specimen Description PERICARDIAL  Final   Special Requests NONE  Final   Gram Stain   Final    ABUNDANT WBC PRESENT, PREDOMINANTLY PMN NO ORGANISMS SEEN    Report Status 11/08/2016 FINAL  Final  Acid Fast Smear (AFB)      Status: None   Collection Time: 11/08/16  3:21 PM  Result Value Ref Range Status   AFB Specimen Processing Concentration  Final   Acid Fast Smear Negative  Final    Comment: (NOTE) Performed At: [facility] [address] [place], [place] [id number] Mila Homer MD WU:[id number]    Source (AFB) FLUID  Final    Comment: PERICARDIAL    Coagulation Studies: No results for input(s): LABPROT, INR in the last 72 hours.  Urinalysis: No results for input(s): COLORURINE, LABSPEC, PHURINE, GLUCOSEU, HGBUR, BILIRUBINUR, KETONESUR, PROTEINUR, UROBILINOGEN, NITRITE, LEUKOCYTESUR in the last 72 hours.  Invalid input(s): APPERANCEUR    Imaging: No results found.   Medications:    . albuterol  5 mg Nebulization Once  . aspirin EC  81 mg Oral Daily  . dextrose  1 ampule Intravenous Once  . fenofibrate  160 mg Oral Daily  . fludrocortisone  0.1 mg Oral Daily  . furosemide  60 mg Intravenous Once  . furosemide  40 mg Oral BID  . gabapentin  400 mg Oral QHS  . heparin  5,000 Units Subcutaneous Q8H  . hydrocortisone  10 mg Oral Daily   And  . hydrocortisone  5 mg Oral QHS  . levothyroxine  100 mcg Oral QAC breakfast  . loratadine  10 mg Oral Daily  . Melatonin  2 tablet Oral QHS  . metoprolol tartrate  25 mg Oral BID  . nicotine  14 mg Transdermal Daily  . nitroGLYCERIN  0.5 inch Topical Q6H  . nystatin ointment  1 application Topical BID  . pantoprazole  40 mg Oral Daily  . pravastatin  20 mg Oral q1800  . risperidone  2-4 mg Oral BID  . sertraline  50 mg Oral Daily  . sevelamer carbonate  800 mg Oral BID WC  . sodium chloride flush  3 mL Intravenous Q12H  . topiramate  250 mg Oral BID  . traZODone  100 mg Oral QHS  . [START ON 12/08/2016] Vitamin D (Ergocalciferol)  50,000 Units Oral Q30 days   sodium chloride, sodium chloride, acetaminophen **OR** acetaminophen, alteplase, diphenhydrAMINE, heparin, HYDROmorphone (DILAUDID) injection, lidocaine (PF),  lidocaine-prilocaine, ondansetron **OR** ondansetron (ZOFRAN) IV, ondansetron (ZOFRAN) IV, ondansetron, oxyCODONE, pentafluoroprop-tetrafluoroeth  Assessment/ Plan:  [name] is a 46 y.o. white female with End stage renal disease on hemodialysis, blindness, hypertension, diabetes mellitus type II, hyperlipidemia  TTS [facility] right arm AVG  1. End Stage Renal Disease with hyperkalemia: and noncompliance.  - Tolerated dialysis today. 2.5L removed.  - Monitor for dialysis need daily. Resume TTS schedule.    2.  Hypotension - on hydrocortisone and fludrocortisone..   3. Anemia of chronic kidney disease: hemoglobin 10.8 - hold epo due to thrombosis  4. Secondary Hyperparathyroidism: hypocalcemia (7.4) -calcium carbonate BID - Continue sevelamer with meals.     LOS: 3 Baley Shands 1/23/20183:15 PM

## 2016-11-22 NOTE — Progress Notes (Signed)
Pre hd info 

## 2016-11-22 NOTE — Progress Notes (Signed)
PT Cancellation Note  Patient Details Name: Erin [name] MRN: [id number] DOB: [birth year]   Cancelled Treatment:    Reason Eval/Treat Not Completed: Patient at procedure or test/unavailable.  For HD.  Will continue to follow pt acutely.   Encarnacion Chu PT, DPT 11/22/2016, 11:55 AM

## 2016-11-22 NOTE — Progress Notes (Signed)
Pre hd assessment  

## 2016-11-22 NOTE — Progress Notes (Signed)
CBG 84. Pt said she feels like sugar is lower. Called primary RN and requested peanut butter and juice per pt.

## 2016-11-22 NOTE — Progress Notes (Signed)
  End of hd 

## 2016-11-22 NOTE — Progress Notes (Signed)
Post hd vitals 

## 2016-11-22 NOTE — Clinical Social Work Note (Signed)
Clinical Social Work Assessment  Patient Details  Name: Erin [name] MRN: [id number] Date of Birth: [birth year]  Date of referral:  11/22/16               Reason for consult:  Facility Placement                Permission sought to share information with:  Facility Industrial/product designer granted to share information::  Yes, Verbal Permission Granted  Name::        Agency::     Relationship::     Contact Information:     Housing/Transportation Living arrangements for the past 2 months:  Apartment Source of Information:  Patient Patient Interpreter Needed:  None Criminal Activity/Legal Involvement Pertinent to Current Situation/Hospitalization:  No - Comment as needed Significant Relationships:  None Lives with:  Self Do you feel safe going back to the place where you live?  Yes Need for family participation in patient care:  No (Coment)  Care giving concerns:  Patient resides at home alone and is blind.   Social Worker assessment / plan:  CSW consulted for STR. PT has assessed and recommended STR. Patient ambulated 25 feet. CSW spoke with patient this afternoon and she informed CSW that she lives alone and is blind and that she would feel better going to a facility for rehab as her mobility is lessened. Patient stated that she wishes for [facility] if possible. CSW to initiate bedsearch and pasrr. Patient might be a level 2 pasrr.   Employment status:  Disabled (Comment on whether or not currently receiving Disability) Insurance information:  Medicare PT Recommendations:    Information / Referral to community resources:     Patient/Family's Response to care:  Patient expressed appreciation for CSW assistance.   Patient/Family's Understanding of and Emotional Response to Diagnosis, Current Treatment, and Prognosis:  Patient feels as though she would do better at rehab given her current medical issues.   Emotional Assessment Appearance:  Appears older than  stated age Attitude/Demeanor/Rapport:   (pleasant and cooperative) Affect (typically observed):  Accepting, Adaptable, Pleasant, Calm Orientation:  Oriented to  Time, Oriented to Place, Oriented to Self, Oriented to Situation Alcohol / Substance use:  Not Applicable Psych involvement (Current and /or in the community):  No (Comment)  Discharge Needs  Concerns to be addressed:  Care Coordination Readmission within the last 30 days:  No Current discharge risk:  None Barriers to Discharge:  No Barriers Identified   York Spaniel, LCSW 11/22/2016, 2:21 PM

## 2016-11-22 NOTE — Progress Notes (Signed)
Pt had 2 peanut butter cups and 2 juices from primary RN. CBG 90

## 2016-11-23 ENCOUNTER — Other Ambulatory Visit: Payer: Self-pay

## 2016-11-23 LAB — GLUCOSE, CAPILLARY
GLUCOSE-CAPILLARY: 100 mg/dL — AB (ref 65–99)
GLUCOSE-CAPILLARY: 71 mg/dL (ref 65–99)
GLUCOSE-CAPILLARY: 90 mg/dL (ref 65–99)
Glucose-Capillary: 83 mg/dL (ref 65–99)
Glucose-Capillary: 97 mg/dL (ref 65–99)
Glucose-Capillary: 97 mg/dL (ref 65–99)
Glucose-Capillary: 98 mg/dL (ref 65–99)
Glucose-Capillary: 109 mg/dL — ABNORMAL HIGH (ref 65–99)

## 2016-11-23 LAB — TROPONIN I: TROPONIN I: 0.32 ng/mL — AB (ref <0.03)

## 2016-11-23 MED ORDER — TAMSULOSIN HCL 0.4 MG PO CAPS: 0.4000 mg | ORAL_CAPSULE | Freq: Every day | ORAL | Status: AC

## 2016-11-23 MED ORDER — HYDROMORPHONE HCL 1 MG/ML IJ SOLN
1.0000 mg | Freq: Four times a day (QID) | INTRAMUSCULAR | Status: DC | PRN
  Administered 2016-11-23 – 2016-11-24 (×3): 1 mg via INTRAVENOUS
  Filled 2016-11-23 (×3): qty 1

## 2016-11-23 MED ORDER — TAMSULOSIN HCL 0.4 MG PO CAPS
0.4000 mg | ORAL_CAPSULE | Freq: Every day | ORAL | Status: DC
  Administered 2016-11-23 – 2016-11-24 (×2): 0.4 mg via ORAL
  Filled 2016-11-23 (×2): qty 1

## 2016-11-23 MED ORDER — OXYCODONE HCL 5 MG PO TABS: 5.0000 mg | ORAL_TABLET | Freq: Four times a day (QID) | ORAL | 0 refills | Status: DC | PRN

## 2016-11-23 MED ORDER — OXYCODONE HCL 5 MG PO TABS
5.0000 mg | ORAL_TABLET | Freq: Once | ORAL | Status: AC
  Administered 2016-11-23: 5 mg via ORAL
  Filled 2016-11-23: qty 1

## 2016-11-23 NOTE — Clinical Social Work Note (Signed)
Patient has had two bed offers and CSW has extended these bed offers to the patient. Patient wishes to call both of them before making a decision. Patient's pasrr has been completed. York Spaniel MSW,LCSW

## 2016-11-23 NOTE — Progress Notes (Signed)
The moment RN injected Dilaudid 1 mg PRN, pt stated "oh my chest pain is instantly gone."   Galina Haddox [employer]

## 2016-11-23 NOTE — Progress Notes (Signed)
Pt complains of chest pain, RN gave pt her Nitro ointment at 1618 VS checked, BP 134/62, pulse 61, O2 99. Chest pain reassessed, pt still complains of tightness in her chest and no relief, while pt is talking on the phone she is telling RN of her chest pain . Dr. Elpidio Anis was notified, new orders to place STAT EKG, troponin check, Oxycodone IR 5 mg. Will monitor pt closely.   Erin [name]

## 2016-11-23 NOTE — Plan of Care (Signed)
Problem: Pain Managment: Goal: General experience of comfort will improve Outcome: Progressing Patient rested well throughout night after prn dose of pain medication

## 2016-11-23 NOTE — Progress Notes (Signed)
[facility]  ROUNDING NOTE   Subjective:   [name] admitted to [facility] on 11/19/2016 for Shortness of breath [R06.02] Hyperkalemia [E87.5] Hypoglycemia [E16.2] Pain [R52] Chest pain [R07.9] Non-ST elevation myocardial infarction (NSTEMI), type 2 (HCC) [I21.4] Chest pain, unspecified type [R07.9]   Patient complains of lower abdominal pain and inability to urinate.  States she had I&O cath earlier today yielding 400cc urine.   She was on a bedpan during exam and has not been able to urinate.  Bladder scan completed in room shows >200cc urine. Denies shortness of breath, nausea, vomiting.  Able to eat breakfast and lunch today.   Objective:  Vital signs in last 24 hours:  Temp:  [98.1 F (36.7 C)-98.5 F (36.9 C)] 98.1 F (36.7 C) (01/24 0524) Pulse Rate:  [57-72] 61 (01/24 1418) Resp:  [16-19] 19 (01/24 1418) BP: (138-154)/(63-65) 154/65 (01/24 1418) SpO2:  [96 %-100 %] 99 % (01/24 1418)  Weight change:  Filed Weights   11/20/16 1415 11/22/16 0940 11/22/16 1245  Weight: 74.5 kg (164 lb 3.9 oz) 71.5 kg (157 lb 10.1 oz) 69.4 kg (153 lb)    Intake/Output: I/O last 3 completed shifts: In: 488 [P.O.:480; Other:8] Out: 3300 [Urine:800; Other:2500]   Intake/Output this shift:  Total I/O In: -  Out: 350 [Urine:350]  Physical Exam: General: NAD, laying in bed  Head: Normocephalic, atraumatic.   ENT   Moist oral mucosal membranes  Neck: Supple, trachea midline  Lungs:  Clear to auscultation  Heart: Regular rate and rhythm  Abdomen:  Soft, mildly tender throughout  Extremities: trace peripheral edema.  Neurologic: Nonfocal, moving all four extremities  Skin: No lesions  Access: AVG    Basic Metabolic Panel:  Recent Labs Lab 11/19/16 1513 11/19/16 2101 11/20/16 0614 11/20/16 1402  NA 129* 135 133*  --   K 6.4* 4.7 7.3* 3.9  CL 96* 101 99*  --   CO2 23 25 27   --   GLUCOSE 68 97 67  --   BUN 41* 30* 30*  --   CREATININE 5.78* 4.32*  4.68*  --   CALCIUM 7.7* 7.3* 7.4*  --   PHOS  --  3.7  --   --     Liver Function Tests:  Recent Labs Lab 11/19/16 2101  ALBUMIN 2.8*   No results for input(s): LIPASE, AMYLASE in the last 168 hours. No results for input(s): AMMONIA in the last 168 hours.  CBC:  Recent Labs Lab 11/19/16 1513 11/19/16 2101 11/20/16 0614  WBC 8.1 4.8 6.0  NEUTROABS 5.1  --   --   HGB 12.1 10.3* 10.8*  HCT 36.1 30.6* 32.1*  MCV 93.2 91.1 93.0  PLT 278 235 224    Cardiac Enzymes:  Recent Labs Lab 11/19/16 1513 11/19/16 2101 11/20/16 0051  TROPONINI 0.88* 0.80* 0.78*    BNP: Invalid input(s): POCBNP  CBG:  Recent Labs Lab 11/23/16 0520 11/23/16 0757 11/23/16 0909 11/23/16 1146 11/23/16 1604  GLUCAP 83 71 97 97 98    Microbiology: Results for orders placed or performed during the hospital encounter of 11/08/16  Surgical pcr screen     Status: Abnormal   Collection Time: 11/08/16  1:43 PM  Result Value Ref Range Status   MRSA, PCR NEGATIVE NEGATIVE Final   Staphylococcus aureus POSITIVE (A) NEGATIVE Final    Comment:        The Xpert SA Assay (FDA approved for NASAL specimens in patients over 109 years of age), is  one component of a comprehensive surveillance program.  Test performance has been validated by [facility] for patients greater than or equal to 63 year old. It is not intended to diagnose infection nor to guide or monitor treatment.   Culture, body fluid-bottle     Status: None   Collection Time: 11/08/16  3:21 PM  Result Value Ref Range Status   Specimen Description PERICARDIAL  Final   Special Requests BOTTLES DRAWN AEROBIC AND ANAEROBIC 10CC  Final   Culture NO GROWTH 5 DAYS  Final   Report Status 11/13/2016 FINAL  Final  Gram stain     Status: None   Collection Time: 11/08/16  3:21 PM  Result Value Ref Range Status   Specimen Description PERICARDIAL  Final   Special Requests NONE  Final   Gram Stain   Final    ABUNDANT WBC PRESENT,  PREDOMINANTLY PMN NO ORGANISMS SEEN    Report Status 11/08/2016 FINAL  Final  Acid Fast Smear (AFB)     Status: None   Collection Time: 11/08/16  3:21 PM  Result Value Ref Range Status   AFB Specimen Processing Concentration  Final   Acid Fast Smear Negative  Final    Comment: (NOTE) Performed At: [facility] [address] [place], [place] [id number] Mila Homer MD XJ:[id number]    Source (AFB) FLUID  Final    Comment: PERICARDIAL    Coagulation Studies: No results for input(s): LABPROT, INR in the last 72 hours.  Urinalysis: No results for input(s): COLORURINE, LABSPEC, PHURINE, GLUCOSEU, HGBUR, BILIRUBINUR, KETONESUR, PROTEINUR, UROBILINOGEN, NITRITE, LEUKOCYTESUR in the last 72 hours.  Invalid input(s): APPERANCEUR    Imaging: No results found.   Medications:    . albuterol  5 mg Nebulization Once  . aspirin EC  81 mg Oral Daily  . calcium carbonate  500 mg of elemental calcium Oral BID WC  . dextrose  1 ampule Intravenous Once  . fenofibrate  160 mg Oral Daily  . fludrocortisone  0.1 mg Oral Daily  . furosemide  40 mg Oral BID  . gabapentin  400 mg Oral QHS  . heparin  5,000 Units Subcutaneous Q8H  . hydrocortisone  10 mg Oral Daily   And  . hydrocortisone  5 mg Oral QHS  . levothyroxine  100 mcg Oral QAC breakfast  . loratadine  10 mg Oral Daily  . Melatonin  2 tablet Oral QHS  . metoprolol tartrate  25 mg Oral BID  . nicotine  14 mg Transdermal Daily  . nitroGLYCERIN  0.5 inch Topical Q6H  . nystatin ointment  1 application Topical BID  . pantoprazole  40 mg Oral Daily  . pravastatin  20 mg Oral q1800  . risperidone  2-4 mg Oral BID  . sertraline  50 mg Oral Daily  . sevelamer carbonate  800 mg Oral BID WC  . sodium chloride flush  3 mL Intravenous Q12H  . tamsulosin  0.4 mg Oral Daily  . topiramate  250 mg Oral BID  . traZODone  100 mg Oral QHS  . [START ON 12/08/2016] Vitamin D (Ergocalciferol)  50,000 Units Oral Q30 days   sodium  chloride, sodium chloride, acetaminophen **OR** acetaminophen, alteplase, diphenhydrAMINE, heparin, lidocaine (PF), lidocaine-prilocaine, ondansetron **OR** ondansetron (ZOFRAN) IV, ondansetron (ZOFRAN) IV, ondansetron, oxyCODONE, pentafluoroprop-tetrafluoroeth  Assessment/ Plan:  [name] is a 46 y.o. white female with End stage renal disease on hemodialysis, blindness, hypertension, diabetes mellitus type II, hyperlipidemia  TTS [facility]  right arm AVG  1. End Stage Renal Disease with hyperkalemia: and noncompliance.  - Dialysis tomorrow if patient remains in hospital. -Possible urinary retention, >200cc urine found on bladder scan.  Repeat bladder scan q8h to monitor. -UA and culture ordered, results pending  - Monitor for dialysis need daily. Resume TTS schedule.    2. Hypotension - on hydrocortisone and fludrocortisone..   3. Anemia of chronic kidney disease: hemoglobin 10.8 (11/20/16) - hold epo due to thrombosis  4. Secondary Hyperparathyroidism: hypocalcemia (7.4) -calcium carbonate BID - Continue sevelamer with meals.     LOS: 4 Brynnlie Unterreiner 1/24/20184:19 PM

## 2016-11-23 NOTE — Progress Notes (Signed)
Pt was in and out cath per verbal order to receive a UA and urine culture. Pt was bladder scanned prior to in and out cath, bladder scanner showed 200 mL. RN received 350 mL from in and out cath.   Erin [name] [employer]

## 2016-11-23 NOTE — Progress Notes (Signed)
[facility]   PATIENT NAME: Erin [name]    MR#:  [id number]  DATE OF BIRTH:  [birth year]  SUBJECTIVE:   CHIEF COMPLAINT:   Chief Complaint  Patient presents with  . Chest Pain   No further vomiting/diarrhea.  Suprapubic pain earlier. Now has some chest pain. Unhappy dilaudid was discontinued.  No SOB. Recent cath was normal.  REVIEW OF SYSTEMS:   ROS CONSTITUTIONAL: No fever, fatigue or weakness.  EYES: No blurred or double vision.  EARS, NOSE, AND THROAT: No tinnitus or ear pain.  RESPIRATORY: No cough, shortness of breath, wheezing or hemoptysis.  CARDIOVASCULAR: No chest pain, orthopnea, edema.  GASTROINTESTINAL: No nausea, vomiting, diarrhea or abdominal pain.  GENITOURINARY: No dysuria, hematuria.  ENDOCRINE: No polyuria, nocturia,  HEMATOLOGY: No anemia, easy bruising or bleeding SKIN: No rash or lesion. MUSCULOSKELETAL: No joint pain or arthritis.   NEUROLOGIC: No tingling, numbness, weakness.  PSYCHIATRY: No anxiety or depression.   DRUG ALLERGIES:   Allergies  Allergen Reactions  . Dhea [Nutritional Supplements] Anaphylaxis    Patient states medication is DHE for migraines, not DHEA  . Demerol [Meperidine] Other (See Comments)    Reaction:  Hallucinations   . Floxin [Ofloxacin] Other (See Comments)    Reaction:  Hallucinations   . Nsaids Nausea And Vomiting and Swelling  . Nubain [Nalbuphine Hcl] Other (See Comments)    Reaction:  Hallucinations   . Phenergan [Promethazine Hcl] Other (See Comments)    Reaction:  Restless legs   . Stadol [Butorphanol] Other (See Comments)    Reaction:  Hallucinations   . Erythromycin Diarrhea and Rash    VITALS:  Blood pressure 134/62, pulse 61, temperature 98.1 F (36.7 C), temperature source Oral, resp. rate 19, height 4\' 10"  (1.473 m), weight 69.4 kg (153 lb), last menstrual period 11/21/2007, SpO2 99 %.  PHYSICAL EXAMINATION:  GENERAL:  46 y.o.-year-old  patient lying in the bed with no acute distress.  EYES: Pupils equal, round, reactive to light and accommodation. No scleral icterus. Extraocular muscles intact.  HEENT: Head atraumatic, normocephalic. Oropharynx and nasopharynx clear.  NECK:  Supple, no jugular venous distention. No thyroid enlargement, no tenderness.  LUNGS: Normal breath sounds bilaterally, no wheezing, rales,rhonchi or crepitation. No use of accessory muscles of respiration.  CARDIOVASCULAR: S1, S2 normal. No murmurs, rubs, or gallops.  ABDOMEN: Soft, nontender, nondistended. Bowel sounds present. No organomegaly or mass.  EXTREMITIES: No pedal edema, cyanosis, or clubbing.  NEUROLOGIC: Moves all 4 extremities.  PSYCHIATRIC: The patient is alert and awake. anxious SKIN: No obvious rash, lesion, or ulcer.   LABORATORY PANEL:   CBC  Recent Labs Lab 11/20/16 0614  WBC 6.0  HGB 10.8*  HCT 32.1*  PLT 224   ------------------------------------------------------------------------------------------------------------------  Chemistries   Recent Labs Lab 11/20/16 0614 11/20/16 1402  NA 133*  --   K 7.3* 3.9  CL 99*  --   CO2 27  --   GLUCOSE 67  --   BUN 30*  --   CREATININE 4.68*  --   CALCIUM 7.4*  --    ------------------------------------------------------------------------------------------------------------------  Cardiac Enzymes  Recent Labs Lab 11/23/16 1706  TROPONINI 0.32*   ------------------------------------------------------------------------------------------------------------------  RADIOLOGY:  No results found.  EKG:   Orders placed or performed during the hospital encounter of 11/19/16  . EKG 12-Lead  . EKG 12-Lead  . ED EKG  . ED EKG  . EKG 12-Lead  . EKG 12-Lead    ASSESSMENT AND PLAN:   #  urinary retention Check UA. In and out cath Start flomax Foley if no improvement by tomorrow  # Hyperkalemia due to missing HD Now normal Advised to be compliant Discussed  with Dr. Thedore Mins of nephrology  # Elevated troponins likely demand ischemia: Patient on aspirin, metoprolol, nitroglycerin, had recent pericardial effusion drained on 1/9. had normal cath  In 12/17 Repeat troponin lower than before EKG- Nothing acute  #3 diarrhea: Resolved Likely acute gastroenteritis  #4 history of bipolar disorder  continue present medication  #5 hypothyroidism   CODE STATUS: full  TOTAL TIME TAKING CARE OF THIS PATIENT:35 minutes.   POSSIBLE D/C IN 1-2 DAYS, DEPENDING ON CLINICAL CONDITION to SNF   Milagros Loll R M.D on 11/23/2016 at 6:14 PM  Between 7am to 6pm - Pager - [phone]  After 6pm go to [email or web address] - password [email or web address]  [facility]  Office  [phone]  CC: Primary care physician; [facility] PRIMARY CARE   Note: This dictation was prepared with Dragon dictation along with smaller phrase technology. Any transcriptional errors that result from this process are unintentional.

## 2016-11-23 NOTE — Progress Notes (Signed)
RN notified Dr. Elpidio Anis of pt.'s troponin level 0.32 at this time. Pt also notified RN that she is still having chest pain with no relief, RN notified Dr. Elpidio Anis, new orders to place dilaudid 1 mg Q6 PRN. Will continue to monitor pt.   Erin [name] [employer]

## 2016-11-23 NOTE — Progress Notes (Signed)
Physical Therapy Treatment Patient Details Name: Erin [name] MRN: [id number] DOB: 1971/06/05 Today's Date: 11/23/2016    History of Present Illness Pt is a 46 y/o F who presented with L upper chest pain, nausea, diarrhea.  Pt with hyperkalemia due to missed dialysis session.  Pt's PMH includes blidness, bipolar affect, chronic headaches, chornic upper back pain, epilepsy, DVT, stroke, brain surgery, breast lumpectomy.    PT Comments    Pt agreeable to PT; reports pain in bilateral upper and lower extremities as well as chronic chest pain. Pain greater in lower extremities. Pt has been premedicated. Pain at rest 3/10; increases at times to 10/10 with movement/ambulation. Pt feels the need to void with 2 transfers/ambulation to bedside commode; pt unable to despite efforts of seated exercises and additional ambulation in the room (limited by pain). Pt becomes distraught and tearful due to fear of requiring catheterization. Simple conversation and suggestions to encourage/assist voiding relaxes pt to improved mood. Pt declines up in chair despite encouragement. Continue PT to progress strength and endurance to improve all functional mobility.   Follow Up Recommendations  SNF     Equipment Recommendations  None recommended by PT    Recommendations for Other Services       Precautions / Restrictions Precautions Precautions: Fall;Other (comment) Restrictions Weight Bearing Restrictions: No    Mobility  Bed Mobility Overal bed mobility: Needs Assistance Bed Mobility: Supine to Sit;Sit to Supine     Supine to sit: HOB elevated;Supervision Sit to supine: Supervision;HOB elevated   General bed mobility comments: slow, use of rail and close S  Transfers Overall transfer level: Needs assistance Equipment used: None Transfers: Sit to/from Stand Sit to Stand: Min guard         General transfer comment: Verbal cues given for spatial awareness due to  blindness  Ambulation/Gait Ambulation/Gait assistance: Min guard;Min assist Ambulation Distance (Feet): 20 Feet (also bed to/from bedside commode 2x) Assistive device: 1 person hand held assist Gait Pattern/deviations: Decreased stride length;Step-through pattern Gait velocity: decreased Gait velocity interpretation: <1.8 ft/sec, indicative of risk for recurrent falls General Gait Details: Cues for spatial awareness in room due to blindness   Stairs            Wheelchair Mobility    Modified Rankin (Stroke Patients Only)       Balance Overall balance assessment: Needs assistance Sitting-balance support: Bilateral upper extremity supported Sitting balance-Leahy Scale: Good     Standing balance support: Single extremity supported Standing balance-Leahy Scale: Fair                      Cognition Arousal/Alertness: Awake/alert Behavior During Therapy: WFL for tasks assessed/performed (frustrated/tearful at times due to inability to void) Overall Cognitive Status: Within Functional Limits for tasks assessed                      Exercises General Exercises - Lower Extremity Ankle Circles/Pumps: AROM;Both;20 reps;Seated Quad Sets: Strengthening;Both;10 reps;Supine Gluteal Sets: Strengthening;Both;20 reps;Supine Long Arc Quad: AROM;Both;20 reps;Seated Heel Slides: AROM;Both;10 reps;Supine Hip ABduction/ADduction: AAROM;AROM;Both;10 reps;Supine Hip Flexion/Marching: AROM;Both;20 reps;Seated    General Comments        Pertinent Vitals/Pain Pain Assessment: 0-10 Pain Score: 3  (reports 10 at times with movement) Pain Location: BLE and UEs; chest which is chronic Pain Intervention(s): Premedicated before session;Repositioned;Monitored during session;Limited activity within patient's tolerance    Home Living  Prior Function            PT Goals (current goals can now be found in the care plan section) Progress towards  PT goals: Progressing toward goals    Frequency    Min 2X/week      PT Plan Current plan remains appropriate    Co-evaluation             End of Session Equipment Utilized During Treatment: Gait belt Activity Tolerance: Patient limited by fatigue;Patient limited by pain Patient left: in bed;with call bell/phone within reach;with bed alarm set     Time: 1003-1045 PT Time Calculation (min) (ACUTE ONLY): 42 min  Charges:  $Gait Training: 8-22 mins $Therapeutic Exercise: 8-22 mins $Therapeutic Activity: 8-22 mins                    G CodesScot Dock, PTA 11/23/2016, 11:09 AM

## 2016-11-23 NOTE — Progress Notes (Signed)
Patient complaining of inability to void; last void was approximately 22:30 but patient has been unable to void since then.  Bladder scan >200.  Patient requested RN contact MD to obtain order for bladder scan.  Patient states this has happened before when she is in the hospital.  Order received for 1x I&O.

## 2016-11-23 NOTE — Clinical Social Work Note (Signed)
Patient has accepted bed offer from [facility] and can discharge to [facility] when medically stable.  York Spaniel MSW,LCSW [phone]

## 2016-11-24 LAB — URINALYSIS, ROUTINE W REFLEX MICROSCOPIC
BILIRUBIN URINE: NEGATIVE
Bacteria, UA: NONE SEEN
GLUCOSE, UA: NEGATIVE mg/dL
KETONES UR: NEGATIVE mg/dL
LEUKOCYTES UA: NEGATIVE
Nitrite: NEGATIVE
PROTEIN: 100 mg/dL — AB
Specific Gravity, Urine: 1.004 — ABNORMAL LOW (ref 1.005–1.030)
pH: 9 — ABNORMAL HIGH (ref 5.0–8.0)

## 2016-11-24 LAB — GLUCOSE, CAPILLARY
GLUCOSE-CAPILLARY: 124 mg/dL — AB (ref 65–99)
Glucose-Capillary: 79 mg/dL (ref 65–99)
Glucose-Capillary: 82 mg/dL (ref 65–99)
Glucose-Capillary: 86 mg/dL (ref 65–99)

## 2016-11-24 NOTE — Clinical Social Work Note (Signed)
Patient to discharge today to go to [facility]. Patient is aware and in agreement. Doug at [facility] is aware and discharge information has been sent. Nurse to call report and patient is aware and is having someone transport her there. York Spaniel MSW,LCSW [phone]

## 2016-11-24 NOTE — Progress Notes (Signed)
RN called lab to follow up with urine sample results, patient is now able to void and recently did so. Lab to call RN back to follow up regarding the samples. Lab advised to send another sample,however, patient unable to void at this time. Will follow up.

## 2016-11-24 NOTE — Progress Notes (Signed)
Patient discharged to [facility], report given Janeann Merl LPN. Patient is alert and oriented,Vital signs stable Dialysis will be done on 11/25/2016 at 230 pm. Patient family at the bedside to take patient to [facility].

## 2016-11-24 NOTE — Progress Notes (Signed)
[facility]  ROUNDING NOTE   Subjective:   Erin [name] admitted to [facility] on 11/19/2016 for Shortness of breath [R06.02] Hyperkalemia [E87.5] Hypoglycemia [E16.2] Pain [R52] Chest pain [R07.9] Non-ST elevation myocardial infarction (NSTEMI), type 2 (HCC) [I21.4] Chest pain, unspecified type [R07.9]   Patient feels well today Getting ready to be discharged and seen Please note this is a late entry note  her urinalysis was negative for infection   Objective:  Vital signs in last 24 hours:  Temp:  [97.5 F (36.4 C)-97.9 F (36.6 C)] 97.5 F (36.4 C) (01/25 0806) Pulse Rate:  [49-57] 56 (01/25 0806) Resp:  [16-17] 16 (01/25 0806) BP: (101-138)/(58-60) 101/60 (01/25 0806) SpO2:  [93 %-98 %] 98 % (01/25 0806)  Weight change:  Filed Weights   11/20/16 1415 11/22/16 0940 11/22/16 1245  Weight: 74.5 kg (164 lb 3.9 oz) 71.5 kg (157 lb 10.1 oz) 69.4 kg (153 lb)    Intake/Output: I/O last 3 completed shifts: In: 1180 [P.O.:1180] Out: 1300 [Urine:1300]   Intake/Output this shift:  No intake/output data recorded.  Physical Exam: General: NAD, laying in bed  Head: Normocephalic, atraumatic.   ENT   Moist oral mucosal membranes  Neck: Supple, trachea midline  Lungs:  Clear to auscultation  Heart: Regular rate and rhythm  Abdomen:  Soft, mildly tender throughout  Extremities: trace peripheral edema.  Neurologic: Nonfocal, moving all four extremities  Skin: No lesions  Access: AVG    Basic Metabolic Panel:  Recent Labs Lab 11/19/16 1513 11/19/16 2101 11/20/16 0614 11/20/16 1402  NA 129* 135 133*  --   K 6.4* 4.7 7.3* 3.9  CL 96* 101 99*  --   CO2 23 25 27   --   GLUCOSE 68 97 67  --   BUN 41* 30* 30*  --   CREATININE 5.78* 4.32* 4.68*  --   CALCIUM 7.7* 7.3* 7.4*  --   PHOS  --  3.7  --   --     Liver Function Tests:  Recent Labs Lab 11/19/16 2101  ALBUMIN 2.8*   No results for input(s): LIPASE, AMYLASE in the last 168 hours. No  results for input(s): AMMONIA in the last 168 hours.  CBC:  Recent Labs Lab 11/19/16 1513 11/19/16 2101 11/20/16 0614  WBC 8.1 4.8 6.0  NEUTROABS 5.1  --   --   HGB 12.1 10.3* 10.8*  HCT 36.1 30.6* 32.1*  MCV 93.2 91.1 93.0  PLT 278 235 224    Cardiac Enzymes:  Recent Labs Lab 11/19/16 1513 11/19/16 2101 11/20/16 0051 11/23/16 1706  TROPONINI 0.88* 0.80* 0.78* 0.32*    BNP: Invalid input(s): POCBNP  CBG:  Recent Labs Lab 11/23/16 2217 11/24/16 0022 11/24/16 0411 11/24/16 0803 11/24/16 1131  GLUCAP 109* 86 82 124* 79    Microbiology: Results for orders placed or performed during the hospital encounter of 11/19/16  Urine culture     Status: Abnormal (Preliminary result)   Collection Time: 11/23/16  1:36 PM  Result Value Ref Range Status   Specimen Description URINE, RANDOM  Final   Special Requests NONE  Final   Culture 50,000 COLONIES/mL KLEBSIELLA OXYTOCA (A)  Final   Report Status PENDING  Incomplete    Coagulation Studies: No results for input(s): LABPROT, INR in the last 72 hours.  Urinalysis:  Recent Labs  11/23/16 1336  COLORURINE STRAW*  LABSPEC 1.004*  PHURINE 9.0*  GLUCOSEU NEGATIVE  HGBUR MODERATE*  BILIRUBINUR NEGATIVE  KETONESUR NEGATIVE  PROTEINUR  100*  NITRITE NEGATIVE  LEUKOCYTESUR NEGATIVE      Imaging: No results found.   Medications:       Assessment/ Plan:  Erin [name] is a 46 y.o. white female with End stage renal disease on hemodialysis, blindness, hypertension, diabetes mellitus type II, hyperlipidemia  TTS [facility] right arm AVG  1. End Stage Renal Disease with hyperkalemia: and noncompliance.  - patient will schedule for dialysis this afternoon but due to her right availability and discharge planning, her dialysis was postponed until tomorrow.  Outpatient unit was called and this was confirmed.  She has an appointment tomorrow at her home unit at 2:45 PM Patient is thinking about  skipping dialysis on Saturday.  She was advised against it and was encouraged to follow her regular schedule.  2. Hypotension - on hydrocortisone and fludrocortisone..   3. Anemia of chronic kidney disease: hemoglobin 10.8 (11/20/16)  4. Secondary Hyperparathyroidism: hypocalcemia (7.4) - calcium carbonate BID - Continue sevelamer with meals.     LOS: 5 Erin [name] 1/25/20186:14 PM

## 2016-11-24 NOTE — Discharge Summary (Signed)
[facility] [place] at [facility]   PATIENT NAME: Erin [name]    MR#:  [id number]  DATE OF BIRTH:  [birth year]  DATE OF ADMISSION:  11/19/2016 ADMITTING PHYSICIAN: Katharina Caper, MD  DATE OF DISCHARGE: 11/24/2016  PRIMARY CARE PHYSICIAN: [facility]   ADMISSION DIAGNOSIS:  Shortness of breath [R06.02] Hyperkalemia [E87.5] Hypoglycemia [E16.2] Pain [R52] Chest pain [R07.9] Non-ST elevation myocardial infarction (NSTEMI), type 2 (HCC) [I21.4] Chest pain, unspecified type [R07.9]  DISCHARGE DIAGNOSIS:  Principal Problem:   Hyperkalemia Active Problems:   Elevated troponin   ESRD (end stage renal disease) (HCC)   SECONDARY DIAGNOSIS:   Past Medical History:  Diagnosis Date  . Adrenal insufficiency (HCC)   . Anemia   . Anxiety   . Arthritis    "ankles, knees, hips" (11/11/2016)  . Asthma   . Bipolar affect, depressed (HCC)   . Blind    "since age 58 months"  . Chronic bronchitis (HCC)   . Chronic headaches    Seeing Pain Management  . Chronic upper back pain   . Constipation   . Depression   . Epilepsy (HCC)    "no seizure in a long time" (11/11/2016)  . ESRD on dialysis [facility])    "TTS; Cheree Ditto" (11/11/2016)  . Fibromyalgia   . GERD (gastroesophageal reflux disease)   . Heart murmur   . History of benign pituitary tumor   . History of blood transfusion    "bleeding internally; never found out from where"  . History of DVT (deep vein thrombosis)    RLE  . History of hiatal hernia   . History of kidney stones   . Hyperlipidemia   . Hypothyroid   . Myocardial infarction 09/2016  . Narcolepsy   . 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 Pericardial effusion with cardiac tamponade    Hattie Perch 11/10/2016  . Pneumonia X 2  . Renal insufficiency   . Stroke [facility]) ~ 2002   "right side weaker since" (11/11/2016)     ADMITTING HISTORY  HISTORY OF PRESENT ILLNESS: [name]  is a 46 y.o.  female with a known history of Multiple medical problems including diabetes insipidus, end-stage renal disease after she was initiated on upper and at [facility], chronic pain syndrome, who presents to the hospital with complaints of left upper chest pain, nausea, diarrhea. Went to the patient, she was fine up until 2 days ago when she started having diarrheal stool and nausea, no vomiting, she admits of abdominal pain in lower abdomen, it is described as intermittent cramping pain, worsening just prior to defecation, improving with bowel movement. She admits of nausea but no vomiting. She also started having left upper chest pain today in the morning when she was lying in bed. Pain is described as pressure type, 10 out of 10 by intensity, constant with no radiation, increasing with deep breathing and sitting up. Except that she decided to come to emergency room for further evaluation. Of note, she missed hemodialysis on Thursday, 2 days ago. Emergency room, labs revealed elevated potassium level, mild elevation of troponin and hospitalist services were contacted for admission.  HOSPITAL COURSE:   # Urinary retention - resolved Check UA. In and out cath once. No issues now Could be due to IV dilaudid- stopped  # Hyperkalemia due to missing HD Now normal Advised to be compliant Discussed with Dr. Thedore Mins of nephrology  # Elevated troponins likely demand ischemia: Patient on aspirin, metoprolol, nitroglycerin, had recent pericardial  effusion drained on 1/9. had normal cath  In 12/17 Repeat troponin lower than before EKG- Nothing acute Will need cardiology f/u with Dr. Welton Flakes in 1-2 weeks  #3 Diarrhea: Resolved Likely acute gastroenteritis  #4 history of bipolar disorder  continue present medication  #5 hypothyroidism  Stable for discharge to SNF  CONSULTS OBTAINED:  Treatment Team:  Lamont Dowdy, MD  DRUG ALLERGIES:   Allergies  Allergen Reactions  . Dhea [Nutritional Supplements]  Anaphylaxis    Patient states medication is DHE for migraines, not DHEA  . Demerol [Meperidine] Other (See Comments)    Reaction:  Hallucinations   . Floxin [Ofloxacin] Other (See Comments)    Reaction:  Hallucinations   . Nsaids Nausea And Vomiting and Swelling  . Nubain [Nalbuphine Hcl] Other (See Comments)    Reaction:  Hallucinations   . Phenergan [Promethazine Hcl] Other (See Comments)    Reaction:  Restless legs   . Stadol [Butorphanol] Other (See Comments)    Reaction:  Hallucinations   . Erythromycin Diarrhea and Rash    DISCHARGE MEDICATIONS:   Current Discharge Medication List    START taking these medications   Details  tamsulosin (FLOMAX) 0.4 MG CAPS capsule Take 1 capsule (0.4 mg total) by mouth daily. Qty: 30 capsule      CONTINUE these medications which have CHANGED   Details  oxyCODONE (OXY IR/ROXICODONE) 5 MG immediate release tablet Take 1 tablet (5 mg total) by mouth every 6 (six) hours as needed for severe pain. Qty: 15 tablet, Refills: 0      CONTINUE these medications which have NOT CHANGED   Details  fenofibrate (TRICOR) 145 MG tablet Take 145 mg by mouth every other day.    fludrocortisone (FLORINEF) 0.1 MG tablet Take 0.1 mg by mouth daily.    furosemide (LASIX) 40 MG tablet Take 40 mg by mouth 2 (two) times daily.    gabapentin (NEURONTIN) 100 MG capsule Take 400 mg by mouth at bedtime.    hydrocortisone (CORTEF) 10 MG tablet Take 0.5-1 tablets (5-10 mg total) by mouth 2 (two) times daily. Pt takes 10 mg in the morning and 5 mg at bedtime. Qty: 180 tablet, Refills: 3    levothyroxine (SYNTHROID, LEVOTHROID) 100 MCG tablet Take 100 mcg by mouth daily.     loratadine (CLARITIN) 10 MG tablet Take 10 mg by mouth daily.    lovastatin (MEVACOR) 20 MG tablet Take 20 mg by mouth every evening.     Melatonin 5 MG TABS Take 2 tablets by mouth at bedtime.    nystatin ointment (MYCOSTATIN) Apply 1 application topically 2 (two) times daily. Apply to  affected area (groin, abdominal fold). Qty: 30 g, Refills: 0    ondansetron (ZOFRAN) 4 MG tablet Take 1 tablet (4 mg total) by mouth every 8 (eight) hours as needed for nausea or vomiting. Qty: 20 tablet, Refills: 0    pantoprazole (PROTONIX) 40 MG tablet Take 1 tablet (40 mg total) by mouth daily. Qty: 30 tablet, Refills: 0    risperidone (RISPERDAL) 4 MG tablet Take 2-4 mg by mouth 2 (two) times daily. Pt takes 4 mg in the morning and 2 mg at bedtime.    sertraline (ZOLOFT) 50 MG tablet Take 50 mg by mouth daily.    sevelamer carbonate (RENVELA) 800 MG tablet Take 2,400 mg by mouth 2 (two) times daily.     topiramate (TOPAMAX) 200 MG tablet Take 250 mg by mouth 2 (two) times daily. Pt takes with a 50  mg tablet.    traZODone (DESYREL) 100 MG tablet Take 100 mg by mouth at bedtime.    Vitamin D, Ergocalciferol, (DRISDOL) 50000 UNITS CAPS capsule Take 50,000 Units by mouth every 30 (thirty) days. Takes on the 8th of every month        Today   VITAL SIGNS:  Blood pressure 101/60, pulse (!) 56, temperature 97.5 F (36.4 C), temperature source Oral, resp. rate 16, height 4\' 10"  (1.473 m), weight 69.4 kg (153 lb), last menstrual period 11/21/2007, SpO2 98 %.  I/O:   Intake/Output Summary (Last 24 hours) at 11/24/16 1051 Last data filed at 11/24/16 0400  Gross per 24 hour  Intake              700 ml  Output              900 ml  Net             -200 ml    PHYSICAL EXAMINATION:  Physical Exam  GENERAL:  46 y.o.-year-old patient lying in the bed with no acute distress.  LUNGS: Normal breath sounds bilaterally, no wheezing, rales,rhonchi or crepitation. No use of accessory muscles of respiration.  CARDIOVASCULAR: S1, S2 normal. No murmurs, rubs, or gallops. ABDOMEN: Soft, non-tender, non-distended. Bowel sounds present. No organomegaly or mass.  NEUROLOGIC: Moves all 4 extremities. PSYCHIATRIC: The patient is alert and oriented x 3.  SKIN: No obvious rash, lesion, or ulcer.    DATA REVIEW:   CBC  Recent Labs Lab 11/20/16 0614  WBC 6.0  HGB 10.8*  HCT 32.1*  PLT 224    Chemistries   Recent Labs Lab 11/20/16 0614 11/20/16 1402  NA 133*  --   K 7.3* 3.9  CL 99*  --   CO2 27  --   GLUCOSE 67  --   BUN 30*  --   CREATININE 4.68*  --   CALCIUM 7.4*  --     Cardiac Enzymes  Recent Labs Lab 11/23/16 1706  TROPONINI 0.32*    Microbiology Results  Results for orders placed or performed during the hospital encounter of 11/08/16  Surgical pcr screen     Status: Abnormal   Collection Time: 11/08/16  1:43 PM  Result Value Ref Range Status   MRSA, PCR NEGATIVE NEGATIVE Final   Staphylococcus aureus POSITIVE (A) NEGATIVE Final    Comment:        The Xpert SA Assay (FDA approved for NASAL specimens in patients over 8 years of age), is one component of a comprehensive surveillance program.  Test performance has been validated by [facility] for patients greater than or equal to 95 year old. It is not intended to diagnose infection nor to guide or monitor treatment.   Culture, body fluid-bottle     Status: None   Collection Time: 11/08/16  3:21 PM  Result Value Ref Range Status   Specimen Description PERICARDIAL  Final   Special Requests BOTTLES DRAWN AEROBIC AND ANAEROBIC 10CC  Final   Culture NO GROWTH 5 DAYS  Final   Report Status 11/13/2016 FINAL  Final  Gram stain     Status: None   Collection Time: 11/08/16  3:21 PM  Result Value Ref Range Status   Specimen Description PERICARDIAL  Final   Special Requests NONE  Final   Gram Stain   Final    ABUNDANT WBC PRESENT, PREDOMINANTLY PMN NO ORGANISMS SEEN    Report Status 11/08/2016 FINAL  Final  Acid Fast Smear (  AFB)     Status: None   Collection Time: 11/08/16  3:21 PM  Result Value Ref Range Status   AFB Specimen Processing Concentration  Final   Acid Fast Smear Negative  Final    Comment: (NOTE) Performed At: [facility] [address] [place], [place]  [id number] Mila Homer MD WU:[id number]    Source (AFB) FLUID  Final    Comment: PERICARDIAL    RADIOLOGY:  No results found.  Follow up with PCP in 1 week.  Management plans discussed with the patient, family and they are in agreement.  CODE STATUS:     Code Status Orders        Start     Ordered   11/19/16 1939  Full code  Continuous     11/19/16 1938    Code Status History    Date Active Date Inactive Code Status Order ID Comments User Context   11/08/2016  1:41 PM 11/14/2016  4:56 PM Full Code [id number]  Simonne Martinet, NP Inpatient   10/26/2016  9:44 PM 10/29/2016  9:58 PM Full Code [id number]  Tonye Royalty, DO Inpatient   10/02/2016  5:09 AM 10/04/2016  7:05 PM Full Code [id number]  Arnaldo Natal, MD Inpatient   06/27/2016  8:27 PM 06/30/2016  5:15 PM Full Code [id number]  Delano Metz, MD Inpatient   03/04/2016  6:04 AM 03/07/2016  6:24 PM Full Code [id number]  Oralia Manis, MD Inpatient   02/25/2016  3:36 PM 03/01/2016  3:06 PM Full Code [id number]  Enedina Finner, MD Inpatient      TOTAL TIME TAKING CARE OF THIS PATIENT ON DAY OF DISCHARGE: more than 30 minutes.   Milagros Loll R M.D on 11/24/2016 at 10:51 AM  Between 7am to 6pm - Pager - [phone]  After 6pm go to [email or web address] - password [email or web address]  [facility]  Office  [phone]  CC: Primary care physician; [facility] PRIMARY [facility]ARE  Note: This dictation was prepared with Dragon dictation along with smaller phrase technology. Any transcriptional errors that result from this process are unintentional.

## 2016-11-24 NOTE — Clinical Social Work Placement (Signed)
   CLINICAL SOCIAL WORK PLACEMENT  NOTE  Date:  11/24/2016  Patient Details  Name: Erin [name] MRN: [id number] Date of Birth: Nov 11, 1970  Clinical Social Work is seeking post-discharge placement for this patient at the Skilled  Nursing Facility level of care (*CSW will initial, date and re-position this form in  chart as items are completed):  Yes   Patient/family provided with [place] Clinical Social Work Department's list of facilities offering this level of care within the geographic area requested by the patient (or if unable, by the patient's family).  Yes   Patient/family informed of their freedom to choose among providers that offer the needed level of care, that participate in Medicare, Medicaid or managed care program needed by the patient, have an available bed and are willing to accept the patient.  Yes   Patient/family informed of [facility]'s ownership interest in [facility] and [facility], as well as of the fact that they are under no obligation to receive care at these facilities.  PASRR submitted to EDS on 11/22/16     PASRR number received on       Existing PASRR number confirmed on       FL2 transmitted to all facilities in geographic area requested by pt/family on 11/22/16     FL2 transmitted to all facilities within larger geographic area on       Patient informed that his/her managed care company has contracts with or will negotiate with certain facilities, including the following:        Yes   Patient/family informed of bed offers received.  Patient chooses bed at  [facility])     Physician recommends and patient chooses bed at  [facility])    Patient to be transferred to  [employer]) on 11/24/16.  Patient to be transferred to facility by  (personal vehicle)     Patient family notified on 11/24/16 of transfer.  Name of family member notified:  patient alert and oriented X4     PHYSICIAN       Additional Comment:     _______________________________________________ York Spaniel, LCSW 11/24/2016, 11:42 AM

## 2016-11-25 LAB — URINE CULTURE: Culture: 50000 — AB

## 2016-12-08 ENCOUNTER — Encounter: Payer: Self-pay | Admitting: *Deleted

## 2016-12-08 ENCOUNTER — Inpatient Hospital Stay
Admission: EM | Admit: 2016-12-08 | Discharge: 2016-12-09 | DRG: 640 | Disposition: A | Discharge status: Skilled Nursing Facility | Payer: Medicare Other | Attending: Specialist | Admitting: Specialist

## 2016-12-08 ENCOUNTER — Emergency Department: Payer: Medicare Other

## 2016-12-08 DIAGNOSIS — Z9115 Patient's noncompliance with renal dialysis: Secondary | ICD-10-CM

## 2016-12-08 DIAGNOSIS — K219 Gastro-esophageal reflux disease without esophagitis: Secondary | ICD-10-CM | POA: Diagnosis present

## 2016-12-08 DIAGNOSIS — I319 Disease of pericardium, unspecified: Secondary | ICD-10-CM | POA: Diagnosis not present

## 2016-12-08 DIAGNOSIS — N2581 Secondary hyperparathyroidism of renal origin: Secondary | ICD-10-CM | POA: Diagnosis not present

## 2016-12-08 DIAGNOSIS — R079 Chest pain, unspecified: Secondary | ICD-10-CM

## 2016-12-08 DIAGNOSIS — Z992 Dependence on renal dialysis: Secondary | ICD-10-CM | POA: Diagnosis not present

## 2016-12-08 DIAGNOSIS — Z8249 Family history of ischemic heart disease and other diseases of the circulatory system: Secondary | ICD-10-CM | POA: Diagnosis not present

## 2016-12-08 DIAGNOSIS — R0789 Other chest pain: Secondary | ICD-10-CM | POA: Diagnosis present

## 2016-12-08 DIAGNOSIS — H547 Unspecified visual loss: Secondary | ICD-10-CM | POA: Diagnosis not present

## 2016-12-08 DIAGNOSIS — E039 Hypothyroidism, unspecified: Secondary | ICD-10-CM | POA: Diagnosis present

## 2016-12-08 DIAGNOSIS — I959 Hypotension, unspecified: Secondary | ICD-10-CM | POA: Diagnosis not present

## 2016-12-08 DIAGNOSIS — Z833 Family history of diabetes mellitus: Secondary | ICD-10-CM | POA: Diagnosis not present

## 2016-12-08 DIAGNOSIS — M1990 Unspecified osteoarthritis, unspecified site: Secondary | ICD-10-CM | POA: Diagnosis present

## 2016-12-08 DIAGNOSIS — M797 Fibromyalgia: Secondary | ICD-10-CM | POA: Diagnosis not present

## 2016-12-08 DIAGNOSIS — I252 Old myocardial infarction: Secondary | ICD-10-CM | POA: Diagnosis not present

## 2016-12-08 DIAGNOSIS — E875 Hyperkalemia: Secondary | ICD-10-CM

## 2016-12-08 DIAGNOSIS — F319 Bipolar disorder, unspecified: Secondary | ICD-10-CM | POA: Diagnosis present

## 2016-12-08 DIAGNOSIS — N186 End stage renal disease: Secondary | ICD-10-CM | POA: Diagnosis present

## 2016-12-08 DIAGNOSIS — R110 Nausea: Secondary | ICD-10-CM

## 2016-12-08 DIAGNOSIS — E1122 Type 2 diabetes mellitus with diabetic chronic kidney disease: Secondary | ICD-10-CM | POA: Diagnosis not present

## 2016-12-08 DIAGNOSIS — E785 Hyperlipidemia, unspecified: Secondary | ICD-10-CM | POA: Diagnosis present

## 2016-12-08 DIAGNOSIS — G8929 Other chronic pain: Secondary | ICD-10-CM | POA: Diagnosis present

## 2016-12-08 DIAGNOSIS — J45909 Unspecified asthma, uncomplicated: Secondary | ICD-10-CM | POA: Diagnosis not present

## 2016-12-08 DIAGNOSIS — D631 Anemia in chronic kidney disease: Secondary | ICD-10-CM | POA: Diagnosis present

## 2016-12-08 DIAGNOSIS — Z86718 Personal history of other venous thrombosis and embolism: Secondary | ICD-10-CM

## 2016-12-08 DIAGNOSIS — F17210 Nicotine dependence, cigarettes, uncomplicated: Secondary | ICD-10-CM | POA: Diagnosis present

## 2016-12-08 HISTORY — DX: Pericardial effusion (noninflammatory): I31.3

## 2016-12-08 HISTORY — DX: Other pericardial effusion (noninflammatory): I31.39

## 2016-12-08 LAB — TROPONIN I
TROPONIN I: 0.06 ng/mL — AB (ref <0.03)
Troponin I: 0.06 ng/mL (ref <0.03)
Troponin I: 0.06 ng/mL (ref <0.03)

## 2016-12-08 LAB — CBC
HEMATOCRIT: 29 % — AB (ref 35.0–47.0)
Hemoglobin: 9.9 g/dL — ABNORMAL LOW (ref 12.0–16.0)
MCH: 31.6 pg (ref 26.0–34.0)
MCHC: 34.2 g/dL (ref 32.0–36.0)
MCV: 92.6 fL (ref 80.0–100.0)
PLATELETS: 157 10*3/uL (ref 150–440)
RBC: 3.13 MIL/uL — AB (ref 3.80–5.20)
RDW: 19.8 % — AB (ref 11.5–14.5)
WBC: 6.7 10*3/uL (ref 3.6–11.0)

## 2016-12-08 LAB — BASIC METABOLIC PANEL
Anion gap: 12 (ref 5–15)
BUN: 88 mg/dL — AB (ref 6–20)
CHLORIDE: 99 mmol/L — AB (ref 101–111)
CO2: 20 mmol/L — ABNORMAL LOW (ref 22–32)
CREATININE: 7.56 mg/dL — AB (ref 0.44–1.00)
Calcium: 7.8 mg/dL — ABNORMAL LOW (ref 8.9–10.3)
GFR, EST AFRICAN AMERICAN: 7 mL/min — AB (ref >60)
GFR, EST NON AFRICAN AMERICAN: 6 mL/min — AB (ref >60)
Glucose, Bld: 80 mg/dL (ref 65–99)
POTASSIUM: 6.7 mmol/L — AB (ref 3.5–5.1)
SODIUM: 131 mmol/L — AB (ref 135–145)

## 2016-12-08 LAB — GLUCOSE, CAPILLARY
GLUCOSE-CAPILLARY: 89 mg/dL (ref 65–99)
Glucose-Capillary: 124 mg/dL — ABNORMAL HIGH (ref 65–99)
Glucose-Capillary: 99 mg/dL (ref 65–99)

## 2016-12-08 LAB — BRAIN NATRIURETIC PEPTIDE: B NATRIURETIC PEPTIDE 5: 3643 pg/mL — AB (ref 0.0–100.0)

## 2016-12-08 MED ORDER — SODIUM CHLORIDE 0.9 % IV SOLN
1.0000 g | Freq: Once | INTRAVENOUS | Status: AC
  Administered 2016-12-08: 1 g via INTRAVENOUS

## 2016-12-08 MED ORDER — TOPIRAMATE 25 MG PO TABS
250.0000 mg | ORAL_TABLET | Freq: Two times a day (BID) | ORAL | Status: DC
  Administered 2016-12-08 – 2016-12-09 (×2): 250 mg via ORAL
  Filled 2016-12-08 (×2): qty 2

## 2016-12-08 MED ORDER — ACETAMINOPHEN 325 MG PO TABS
650.0000 mg | ORAL_TABLET | Freq: Once | ORAL | Status: AC
  Administered 2016-12-08: 650 mg via ORAL
  Filled 2016-12-08: qty 2

## 2016-12-08 MED ORDER — GABAPENTIN 300 MG PO CAPS
400.0000 mg | ORAL_CAPSULE | Freq: Every day | ORAL | Status: DC
  Administered 2016-12-08: 22:00:00 400 mg via ORAL
  Filled 2016-12-08: qty 1

## 2016-12-08 MED ORDER — VITAMIN D (ERGOCALCIFEROL) 1.25 MG (50000 UNIT) PO CAPS
50000.0000 [IU] | ORAL_CAPSULE | ORAL | Status: DC
  Administered 2016-12-09: 50000 [IU] via ORAL
  Filled 2016-12-08: qty 1

## 2016-12-08 MED ORDER — LORATADINE 10 MG PO TABS
10.0000 mg | ORAL_TABLET | Freq: Every day | ORAL | Status: DC
  Administered 2016-12-09: 10 mg via ORAL
  Filled 2016-12-08: qty 1

## 2016-12-08 MED ORDER — RISPERIDONE 1 MG PO TABS
2.0000 mg | ORAL_TABLET | Freq: Every day | ORAL | Status: DC
  Administered 2016-12-08: 2 mg via ORAL
  Filled 2016-12-08: qty 2

## 2016-12-08 MED ORDER — TRAZODONE HCL 100 MG PO TABS
100.0000 mg | ORAL_TABLET | Freq: Every day | ORAL | Status: DC
  Administered 2016-12-08: 100 mg via ORAL
  Filled 2016-12-08: qty 1

## 2016-12-08 MED ORDER — ONDANSETRON HCL 4 MG/2ML IJ SOLN
4.0000 mg | Freq: Once | INTRAMUSCULAR | Status: AC | PRN
  Administered 2016-12-08: 4 mg via INTRAVENOUS
  Filled 2016-12-08: qty 2

## 2016-12-08 MED ORDER — ONDANSETRON HCL 4 MG PO TABS: 4.0000 mg | ORAL_TABLET | Freq: Three times a day (TID) | ORAL | Status: DC | PRN

## 2016-12-08 MED ORDER — NICOTINE 7 MG/24HR TD PT24
7.0000 mg | MEDICATED_PATCH | Freq: Every day | TRANSDERMAL | Status: DC
  Administered 2016-12-08: 7 mg via TRANSDERMAL
  Filled 2016-12-08 (×2): qty 1

## 2016-12-08 MED ORDER — EPOETIN ALFA 10000 UNIT/ML IJ SOLN
10000.0000 [IU] | INTRAMUSCULAR | Status: DC
  Filled 2016-12-08: qty 1

## 2016-12-08 MED ORDER — TAMSULOSIN HCL 0.4 MG PO CAPS
0.4000 mg | ORAL_CAPSULE | Freq: Every day | ORAL | Status: DC
  Administered 2016-12-09: 0.4 mg via ORAL
  Filled 2016-12-08: qty 1

## 2016-12-08 MED ORDER — HYDROCORTISONE 5 MG PO TABS
5.0000 mg | ORAL_TABLET | Freq: Every day | ORAL | Status: DC
  Administered 2016-12-08: 5 mg via ORAL
  Filled 2016-12-08 (×3): qty 1

## 2016-12-08 MED ORDER — FUROSEMIDE 40 MG PO TABS
40.0000 mg | ORAL_TABLET | Freq: Two times a day (BID) | ORAL | Status: DC
  Administered 2016-12-08 – 2016-12-09 (×2): 40 mg via ORAL
  Filled 2016-12-08 (×2): qty 1

## 2016-12-08 MED ORDER — NITROGLYCERIN 0.4 MG SL SUBL
0.4000 mg | SUBLINGUAL_TABLET | SUBLINGUAL | Status: DC | PRN
  Administered 2016-12-08: 0.4 mg via SUBLINGUAL
  Filled 2016-12-08: qty 1

## 2016-12-08 MED ORDER — TOPIRAMATE 25 MG PO TABS: 250.0000 mg | ORAL_TABLET | Freq: Two times a day (BID) | ORAL | Status: DC

## 2016-12-08 MED ORDER — LEVOTHYROXINE SODIUM 50 MCG PO TABS: 100.0000 ug | ORAL_TABLET | Freq: Every day | ORAL | Status: DC

## 2016-12-08 MED ORDER — LEVOTHYROXINE SODIUM 100 MCG PO TABS
100.0000 ug | ORAL_TABLET | Freq: Every day | ORAL | Status: DC
  Administered 2016-12-09: 100 ug via ORAL
  Filled 2016-12-08: qty 1

## 2016-12-08 MED ORDER — MELATONIN 5 MG PO TABS
2.0000 | ORAL_TABLET | Freq: Every day | ORAL | Status: DC
  Administered 2016-12-08: 10 mg via ORAL
  Filled 2016-12-08 (×2): qty 2

## 2016-12-08 MED ORDER — INSULIN ASPART 100 UNIT/ML ~~LOC~~ SOLN
10.0000 [IU] | Freq: Once | SUBCUTANEOUS | Status: AC
  Administered 2016-12-08: 10 [IU] via INTRAVENOUS
  Filled 2016-12-08: qty 10

## 2016-12-08 MED ORDER — DEXTROSE 50 % IV SOLN
1.0000 | INTRAVENOUS | Status: AC
  Administered 2016-12-08: 50 mL via INTRAVENOUS
  Filled 2016-12-08: qty 50

## 2016-12-08 MED ORDER — SODIUM POLYSTYRENE SULFONATE 15 GM/60ML PO SUSP
30.0000 g | Freq: Once | ORAL | Status: DC
  Filled 2016-12-08: qty 120

## 2016-12-08 MED ORDER — PRAVASTATIN SODIUM 20 MG PO TABS: 20.0000 mg | ORAL_TABLET | Freq: Every day | ORAL | Status: DC

## 2016-12-08 MED ORDER — OXYCODONE HCL 5 MG PO TABS
5.0000 mg | ORAL_TABLET | Freq: Four times a day (QID) | ORAL | Status: DC | PRN
  Administered 2016-12-08 – 2016-12-09 (×2): 5 mg via ORAL
  Filled 2016-12-08 (×2): qty 1

## 2016-12-08 MED ORDER — HEPARIN SODIUM (PORCINE) 5000 UNIT/ML IJ SOLN
5000.0000 [IU] | Freq: Three times a day (TID) | INTRAMUSCULAR | Status: DC
  Filled 2016-12-08 (×2): qty 1

## 2016-12-08 MED ORDER — SERTRALINE HCL 50 MG PO TABS
50.0000 mg | ORAL_TABLET | Freq: Every day | ORAL | Status: DC
  Administered 2016-12-09: 50 mg via ORAL
  Filled 2016-12-08: qty 1

## 2016-12-08 MED ORDER — RISPERIDONE 1 MG PO TABS: 2.0000 mg | ORAL_TABLET | Freq: Two times a day (BID) | ORAL | Status: DC

## 2016-12-08 MED ORDER — RISPERIDONE 1 MG PO TABS
4.0000 mg | ORAL_TABLET | ORAL | Status: DC
  Administered 2016-12-09: 4 mg via ORAL
  Filled 2016-12-08: qty 4

## 2016-12-08 MED ORDER — DEXTROSE 50 % IV SOLN
1.0000 | Freq: Once | INTRAVENOUS | Status: AC
  Administered 2016-12-08: 50 mL via INTRAVENOUS
  Filled 2016-12-08: qty 50

## 2016-12-08 MED ORDER — CALCIUM GLUCONATE 10 % IV SOLN
INTRAVENOUS | Status: AC
  Filled 2016-12-08: qty 10

## 2016-12-08 MED ORDER — PANTOPRAZOLE SODIUM 40 MG PO TBEC
40.0000 mg | DELAYED_RELEASE_TABLET | Freq: Every day | ORAL | Status: DC
  Administered 2016-12-09: 40 mg via ORAL
  Filled 2016-12-08: qty 1

## 2016-12-08 MED ORDER — HYDROCORTISONE 10 MG PO TABS
10.0000 mg | ORAL_TABLET | ORAL | Status: DC
  Administered 2016-12-09: 10 mg via ORAL
  Filled 2016-12-08 (×2): qty 1

## 2016-12-08 MED ORDER — FENOFIBRATE 160 MG PO TABS
160.0000 mg | ORAL_TABLET | Freq: Every day | ORAL | Status: DC
  Administered 2016-12-09: 160 mg via ORAL
  Filled 2016-12-08: qty 1

## 2016-12-08 MED ORDER — SODIUM CHLORIDE 0.9% FLUSH
3.0000 mL | Freq: Two times a day (BID) | INTRAVENOUS | Status: DC
  Administered 2016-12-08: 3 mL via INTRAVENOUS

## 2016-12-08 MED ORDER — HYDROCORTISONE 5 MG PO TABS
5.0000 mg | ORAL_TABLET | Freq: Two times a day (BID) | ORAL | Status: DC
  Filled 2016-12-08: qty 2

## 2016-12-08 MED ORDER — SEVELAMER CARBONATE 800 MG PO TABS
2400.0000 mg | ORAL_TABLET | Freq: Two times a day (BID) | ORAL | Status: DC
  Administered 2016-12-09: 2400 mg via ORAL
  Filled 2016-12-08: qty 3

## 2016-12-08 NOTE — ED Notes (Signed)
Admitting MD at bedside.

## 2016-12-08 NOTE — ED Provider Notes (Addendum)
[facility] Emergency Department Provider Note  ____________________________________________  Time seen: Approximately 2:09 PM  I have reviewed the triage vital signs and the nursing notes.   HISTORY  Chief Complaint Chest Pain    HPI Erin [name] is a 46 y.o. female with ESRD on HD, chronic pain and fibromyalgia, presenting with chest pain. The patient states that she has not undergone dialysis since last Thursday "because I don't like it there." Today, she presented for dialysis and reported chest pain so they sent her here. The patient reports that yesterday when she was exercising in the morning, she developed a left-sided chest pain that was nonradiating, not associated with any shortness of breath, palpitations, lightheadedness or syncope. This chest pain has been constant and persistent since then. She has had associated nausea but no vomiting. No diarrhea or abdominal pain, fevers or chills, cough or cold symptoms.  The patient underwent cardiac catheterization 10/03/16 with no significant disease findings. She also underwent a pericardial window for pericardial effusion with temp and on signs in mid January thought to be due to skipping dialysis.   Past Medical History:  Diagnosis Date  . Adrenal insufficiency (HCC)   . Anemia   . Anxiety   . Arthritis    "ankles, knees, hips" (11/11/2016)  . Asthma   . Bipolar affect, depressed (HCC)   . Blind    "since age 65 months"  . Chronic bronchitis (HCC)   . Chronic headaches    Seeing Pain Management  . Chronic upper back pain   . Constipation   . Depression   . Epilepsy (HCC)    "no seizure in a long time" (11/11/2016)  . ESRD on dialysis [facility])    "TTS; Cheree Ditto" (11/11/2016)  . Fibromyalgia   . GERD (gastroesophageal reflux disease)   . Heart murmur   . History of benign pituitary tumor   . History of blood transfusion    "bleeding internally; never found out from where"  . History of DVT  (deep vein thrombosis)    RLE  . History of hiatal hernia   . History of kidney stones   . Hyperlipidemia   . Hypothyroid   . Myocardial infarction 09/2016  . Narcolepsy   . 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 Pericardial effusion with cardiac tamponade    Hattie Perch 11/10/2016  . Pneumonia X 2  . Renal insufficiency   . Stroke [facility]) ~ 2002   "right side weaker since" (11/11/2016)    Patient Active Problem List   Diagnosis Date Noted  . Hyperkalemia 11/19/2016  . Elevated troponin 11/19/2016  . ESRD (end stage renal disease) (HCC) 11/19/2016  . Shock circulatory (HCC) 11/08/2016  . Cardiac/pericardial tamponade   . Palliative care encounter   . Goals of care, counseling/discussion   . Seizure disorder (HCC) 06/27/2016  . Blindness 06/27/2016  . Chronic pain 06/27/2016  . History of CVA (cerebrovascular accident) 06/27/2016  . Hypothyroidism 03/04/2016  . GERD (gastroesophageal reflux disease) 03/04/2016  . End stage renal disease on dialysis (HCC) 03/04/2016  . Bipolar 1 disorder, mixed, moderate (HCC) 02/26/2016  . 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 Anemia 02/25/2016    Past Surgical History:  Procedure Laterality Date  . BRAIN SURGERY  1976  . BREAST LUMPECTOMY Left 2001  . CARDIAC CATHETERIZATION Right 10/03/2016   Procedure: Coronary Angiography;  Surgeon: Laurier Nancy, MD;  Location: [facility] INVASIVE CV LAB;  Service:  Cardiovascular;  Laterality: Right;  . CARDIAC CATHETERIZATION N/A 11/08/2016   Procedure: Pericardiocentesis;  Surgeon: Tonny Bollman, MD;  Location: [facility] INVASIVE CV LAB;  Service: Cardiovascular;  Laterality: N/A;  . DG AV DIALYSIS GRAFT DECLOT OR     X 4  . GIVENS CAPSULE STUDY N/A 02/29/2016   Procedure: GIVENS CAPSULE STUDY;  Surgeon: Elnita Maxwell, MD;  Location: [facility] ENDOSCOPY;  Service: Endoscopy;  Laterality: N/A;  . LAPAROSCOPIC  CHOLECYSTECTOMY  2001  . PERICARDIOCENTESIS  11/08/2016   Pericardial effusion with tamponade Hattie Perch 11/10/2016  . PERIPHERAL VASCULAR CATHETERIZATION N/A 08/21/2015   Procedure: Dialysis/Perma Catheter Insertion;  Surgeon: Renford Dills, MD;  Location: [facility] INVASIVE CV LAB;  Service: Cardiovascular;  Laterality: N/A;  . PERIPHERAL VASCULAR CATHETERIZATION N/A 02/29/2016   Procedure: Dialysis/Perma Catheter Removal;  Surgeon: Annice Needy, MD;  Location: [facility] INVASIVE CV LAB;  Service: Cardiovascular;  Laterality: N/A;  . PERIPHERAL VASCULAR CATHETERIZATION N/A 10/28/2016   Procedure: A/V Fistulagram;  Surgeon: Renford Dills, MD;  Location: [facility] INVASIVE CV LAB;  Service: Cardiovascular;  Laterality: N/A;  . PERIPHERAL VASCULAR CATHETERIZATION N/A 10/28/2016   Procedure: A/V Shunt Intervention;  Surgeon: Renford Dills, MD;  Location: [facility] INVASIVE CV LAB;  Service: Cardiovascular;  Laterality: N/A;  . PITUITARY EXCISION  1976    Current Outpatient Rx  . Order #: [id number] Class: Historical Med  . Order #: [id number] Class: Historical Med  . Order #: [id number] Class: Historical Med  . Order #: [id number] Class: Historical Med  . Order #: [id number] Class: Normal  . Order #: [id number] Class: Historical Med  . Order #: [id number] Class: Historical Med  . Order #: [id number] Class: Historical Med  . Order #: [id number] Class: Historical Med  . Order #: [id number] Class: Print  . Order #: [id number] Class: Print  . Order #: [id number] Class: Print  . Order #: [id number] Class: Normal  . Order #: [id number] Class: Historical Med  . Order #: [id number] Class: Historical Med  . Order #: [id number] Class: Historical Med  . Order #: [id number] Class: No Print  . Order #: [id number] Class: Historical Med  . Order #: [id number] Class: Historical Med  . Order #: [id number] Class: Historical Med    Allergies Dhea [nutritional supplements]; Demerol [meperidine]; Floxin [ofloxacin]; Nsaids; Nubain [nalbuphine hcl];  Phenergan [promethazine hcl]; Stadol [butorphanol]; and Erythromycin  Family History  Problem Relation Age of Onset  . Heart attack Mother   . Migraines Mother   . Aneurysm Mother   . Diabetes Father     Social History Social History  Substance Use Topics  . Smoking status: Current Every Day Smoker    Packs/day: 0.50    Years: 18.00    Types: Cigarettes  . Smokeless tobacco: Never Used  . Alcohol use 3.6 oz/week    6 Shots of liquor per week    Review of Systems Constitutional: No fever/chills.No lightheadedness or syncope. Eyes: Line and ENT: No sore throat. No congestion or rhinorrhea. Cardiovascular: Positive chest pain. Denies palpitations. Respiratory: Denies shortness of breath.  No cough. Gastrointestinal: No abdominal pain.  I asked if nausea, no vomiting.  No diarrhea.  No constipation. Genitourinary: Negative for dysuria. Musculoskeletal: Negative for back pain. As the bilateral lower extremity swelling. Skin: Negative for rash. Neurological: Negative for headaches. No focal numbness, tingling or weakness.   10-point ROS otherwise negative.  ____________________________________________   PHYSICAL EXAM:  VITAL SIGNS: ED Triage Vitals  Enc Vitals Group     BP 12/08/16 1248 (!) 172/7     Pulse Rate 12/08/16 1248 72     Resp  12/08/16 1248 13     Temp 12/08/16 1248 97.9 F (36.6 C)     Temp Source 12/08/16 1248 Oral     SpO2 12/08/16 1247 96 %     Weight 12/08/16 1249 153 lb (69.4 kg)     Height 12/08/16 1249 4\' 10"  (1.473 m)     Head Circumference --      Peak Flow --      Pain Score --      Pain Loc --      Pain Edu? --      Excl. in GC? --     Constitutional: The patient is alert and oriented and answering questions appropriate. She is chronically ill-appearing. Eyes: Blind bilaterally. Head: Atraumatic. Nose: No congestion/rhinnorhea. Mouth/Throat: Mucous membranes are dry.  Neck: No stridor.  Supple.  No meningismus. Cardiovascular: Normal  rate, regular rhythm. No murmurs, rubs or gallops.  Respiratory: Normal respiratory effort.  No accessory muscle use or retractions. Lungs CTAB.  No wheezes, rales or ronchi.  Gastrointestinal: Obese. Soft, nontender and nondistended.  No guarding or rebound.  No peritoneal signs. Musculoskeletal: Lateral pitting lower extremity edema to the proximal tibial shaft. No ttp in the calves or palpable cords.  Negative Homan's sign. Right upper extremity has a fistula with a palpable thrill. Neurologic:  A&Ox3.  Speech is clear.  Face and smile are symmetric.  EOMI.  Moves all extremities well. Skin:  Skin is warm, dry and intact. No rash noted. Psychiatric: Mood and affect are normal until I tell her that I will not be treating her pain with narcotics. She then becomes aggressive and agitated.  ____________________________________________   LABS (all labs ordered are listed, but only abnormal results are displayed)  Labs Reviewed  BASIC METABOLIC PANEL - Abnormal; Notable for the following:       Result Value   Sodium 131 (*)    Potassium 6.7 (*)    Chloride 99 (*)    CO2 20 (*)    BUN 88 (*)    Creatinine, Ser 7.56 (*)    Calcium 7.8 (*)    GFR calc non Af Amer 6 (*)    GFR calc Af Amer 7 (*)    All other components within normal limits  CBC - Abnormal; Notable for the following:    RBC 3.13 (*)    Hemoglobin 9.9 (*)    HCT 29.0 (*)    RDW 19.8 (*)    All other components within normal limits  TROPONIN I - Abnormal; Notable for the following:    Troponin I 0.06 (*)    All other components within normal limits  BRAIN NATRIURETIC PEPTIDE   ____________________________________________  EKG  ED ECG REPORT I, Rockne Menghini, the attending physician, personally viewed and interpreted this ECG.   Date: 12/08/2016  EKG Time: 1248  Rate: 71  Rhythm: normal sinus rhythm  Axis: normal  Intervals:none  ST&T Change: Poor baseline tracing. Incomplete right bundle-branch block  or interventricular conduction delay. No STEMI. No peaked T waves; QRS is .  ____________________________________________  RADIOLOGY  Dg Chest 2 View  Result Date: 12/08/2016 CLINICAL DATA:  Left-sided chest pain EXAM: CHEST  2 VIEW COMPARISON:  November 19, 2016 FINDINGS: Stable cardiomegaly. The hila and mediastinum are normal. No pulmonary nodules, masses, or focal infiltrates. No overt edema. IMPRESSION: Stable cardiomegaly. Electronically Signed   By: Gerome Sam III M.D   On: 12/08/2016 13:25    ____________________________________________   PROCEDURES  Procedure(s) performed: None  Procedures  Critical Care performed: No ____________________________________________   INITIAL IMPRESSION / ASSESSMENT AND PLAN / ED COURSE  Pertinent labs & imaging results that were available during my care of the patient were reviewed by me and considered in my medical decision making (see chart for details).  46 y.o. female with a history of ESRD on HD, presenting with chest pain and having skipped dialysis. The patient has reassuring vital signs, and does not have significant evidence of fluid overload on her pulmonary examination although she does have peripheral edema. She does have a potassium of 6.8 and I will treat her aggressively with calcium gluconate, Kayexalate, insulin and glucose for this. She will need dialysis today. For the patient's chest pain, she will need an evaluation by her recent cardiac catheterization is reassuring. Here, her troponin is elevated within her personal normal limits; this will need to be trended. There is no evidence of ischemia on her EKG. At this time I will admit the patient for further evaluation and treatment.  After my full evaluation of the patient, she said, "I need Dilaudid." I have offered her other nonnarcotic options for her pain.  ----------------------------------------- 2:23 PM on  12/08/2016 -----------------------------------------  The nephrologist on-call, Dr. Delfin Edis, is aware of the patient and will plan dialysis for today.  ____________________________________________  FINAL CLINICAL IMPRESSION(S) / ED DIAGNOSES  Final diagnoses:  Hyperkalemia  Left sided chest pain  Nausea         NEW MEDICATIONS STARTED DURING THIS VISIT:  New Prescriptions   No medications on file      Rockne Menghini, MD 12/08/16 1417    Rockne Menghini, MD 12/08/16 1424

## 2016-12-08 NOTE — ED Triage Notes (Signed)
Pt arrived to ED from dialysis reporting left sided chest pain. Pt reports CP began yesterday morning while pt was excersising and is radiating to the left arm. Pt verbalized pain has not decreased since yesterday. Pt denies SOB but reports intermittent nausea. PT did not begin dialysis treatment today due to CP. Last dialysis treatment reported to be last Thursday. Pt has missed dialysis because "they harass me there."  Vitals stable upon arrival and pt alert and oriented x 4. 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 EMS administered 324 ASA prior to arrival.

## 2016-12-08 NOTE — Progress Notes (Signed)
Hemodialysis treatment completed.

## 2016-12-08 NOTE — ED Notes (Signed)
Pt eating food from dialysis from zip lock bag.

## 2016-12-08 NOTE — ED Notes (Signed)
Pt transported to dialysis with NT

## 2016-12-08 NOTE — Progress Notes (Signed)
Post hd tx 

## 2016-12-08 NOTE — Progress Notes (Signed)
Pre-hd [id number] arrived from emergency department for stat treatment due to hypokalemia.

## 2016-12-08 NOTE — Progress Notes (Signed)
Hemodialysis started

## 2016-12-08 NOTE — Progress Notes (Signed)
Hemodialysis:  Patient complaint of "feeling funny" at end of dialysis treatment,blood glucose checked=32.Dr.Kolluru called and gave order for 2 amps total of D50.1 amp given,blood glucose came up to 205.Patient talking with staff appropiately.Will check blood glucose again prior to discharge from dialysis unit and will give second amp of D50 if needed.

## 2016-12-08 NOTE — Progress Notes (Signed)
Post Hemodialysis Patient completed 3hours of hemodialysis with 2.5liters removed.At end of treatment patient stated that she felt funny,blood glucose checked=32,Dr.Kolluru notified and ordered 2amps of D50 to be given.First amp given blood glucose up to 205.Blood glucose checked later=101,blood glucose checked later after 101 was 35,second d50 amp was given and blood glucose up to 206,made primary RN aware,patient transferred so patient could eat something.Arterial access also achieved hemastasis after of holding pressure,sites dressed with gauze/taped.Patient remained alert/oriented throughout hypoglycemic episodes.

## 2016-12-08 NOTE — ED Notes (Signed)
Patient transported to X-ray 

## 2016-12-08 NOTE — ED Notes (Signed)
Dialysis called stating they are ready for pt. Pt signed general op form from ED. Placed on chart. Pt taken to dialysis by Cyrstal, ED tech.

## 2016-12-08 NOTE — Progress Notes (Signed)
[facility]  ROUNDING NOTE   Subjective:   Last dialysis was Thursday. Did not stay for entire treatment. Now K of 6.7. Presents with Chest Pain, weakness and fatigue.   Brought down to dialysis unit for emergent hemodialysis. 2K bath.   Objective:  Vital signs in last 24 hours:  Temp:  [97.9 F (36.6 C)] 97.9 F (36.6 C) (02/08 1248) Pulse Rate:  [69-83] 69 (02/08 1430) Resp:  [13-23] 16 (02/08 1430) BP: (159-181)/(7-83) 166/78 (02/08 1430) SpO2:  [96 %-100 %] 98 % (02/08 1430) Weight:  [69.4 kg (153 lb)] 69.4 kg (153 lb) (02/08 1249)  Weight change:  Filed Weights   12/08/16 1249  Weight: 69.4 kg (153 lb)    Intake/Output: No intake/output data recorded.   Intake/Output this shift:  No intake/output data recorded.  Physical Exam: General: NAD,   Head: Normocephalic, atraumatic. Moist oral mucosal membranes  Eyes: Blind - not examined  Neck: Supple, trachea midline  Lungs:  Clear to auscultation  Heart: Regular rate and rhythm  Abdomen:  Soft, nontender,   Extremities:  1+  peripheral edema.  Neurologic: Nonfocal, moving all four extremities  Skin: No lesions  Access: Left AVG    Basic Metabolic Panel:  Recent Labs Lab 12/08/16 1254  NA 131*  K 6.7*  CL 99*  CO2 20*  GLUCOSE 80  BUN 88*  CREATININE 7.56*  CALCIUM 7.8*    Liver Function Tests: No results for input(s): AST, ALT, ALKPHOS, BILITOT, PROT, ALBUMIN in the last 168 hours. No results for input(s): LIPASE, AMYLASE in the last 168 hours. No results for input(s): AMMONIA in the last 168 hours.  CBC:  Recent Labs Lab 12/08/16 1254  WBC 6.7  HGB 9.9*  HCT 29.0*  MCV 92.6  PLT 157    Cardiac Enzymes:  Recent Labs Lab 12/08/16 1254  TROPONINI 0.06*    BNP: Invalid input(s): POCBNP  CBG:  Recent Labs Lab 12/08/16 1423  GLUCAP 89    Microbiology: Results for orders placed or performed during the hospital encounter of 11/19/16  Urine culture     Status:  Abnormal   Collection Time: 11/23/16  1:36 PM  Result Value Ref Range Status   Specimen Description URINE, RANDOM  Final   Special Requests NONE  Final   Culture 50,000 COLONIES/mL KLEBSIELLA OXYTOCA (A)  Final   Report Status 11/25/2016 FINAL  Final   Organism ID, Bacteria KLEBSIELLA OXYTOCA (A)  Final      Susceptibility   Klebsiella oxytoca - MIC*    AMPICILLIN >=32 RESISTANT Resistant     CEFAZOLIN 8 SENSITIVE Sensitive     CEFTRIAXONE <=1 SENSITIVE Sensitive     CIPROFLOXACIN <=0.25 SENSITIVE Sensitive     GENTAMICIN <=1 SENSITIVE Sensitive     IMIPENEM <=0.25 SENSITIVE Sensitive     NITROFURANTOIN <=16 SENSITIVE Sensitive     TRIMETH/SULFA <=20 SENSITIVE Sensitive     AMPICILLIN/SULBACTAM 16 INTERMEDIATE Intermediate     PIP/TAZO <=4 SENSITIVE Sensitive     Extended ESBL NEGATIVE Sensitive     * 50,000 COLONIES/mL KLEBSIELLA OXYTOCA    Coagulation Studies: No results for input(s): LABPROT, INR in the last 72 hours.  Urinalysis: No results for input(s): COLORURINE, LABSPEC, PHURINE, GLUCOSEU, HGBUR, BILIRUBINUR, KETONESUR, PROTEINUR, UROBILINOGEN, NITRITE, LEUKOCYTESUR in the last 72 hours.  Invalid input(s): APPERANCEUR    Imaging: Dg Chest 2 View  Result Date: 12/08/2016 CLINICAL DATA:  Left-sided chest pain EXAM: CHEST  2 VIEW COMPARISON:  November 19, 2016 FINDINGS:  Stable cardiomegaly. The hila and mediastinum are normal. No pulmonary nodules, masses, or focal infiltrates. No overt edema. IMPRESSION: Stable cardiomegaly. Electronically Signed   By: Gerome Sam III M.D   On: 12/08/2016 13:25     Medications:    . calcium gluconate      . [START ON 12/10/2016] epoetin (EPOGEN/PROCRIT) injection  10,000 Units Intravenous Q T,Th,Sa-HD  . sodium polystyrene  30 g Oral Once   nitroGLYCERIN  Assessment/ Plan:  Erin [name] is a 46 y.o. white female  with End stage renal disease on hemodialysis, blindness, hypertension, diabetes mellitus type II,  hyperlipidemia  TTS [facility] right arm AVG  1. End Stage Renal Disease with hyperkalemia: and noncompliance. ] - Emergent hemodialysis today. 2 K bath. Discussed importance of going to dialysis.   2. Hypotension - on hydrocortisone and fludrocortisone..   3. Anemia of chronic kidney disease: hemoglobin 9.9 - epo with treatment  4. Secondary Hyperparathyroidism: with hypocalcemia  - calcium carbonate BID - Continue sevelamer with meals.    LOS: 0 Charm Stenner 2/8/20183:23 PM

## 2016-12-08 NOTE — Progress Notes (Signed)
Pre-hd tx 

## 2016-12-08 NOTE — H&P (Signed)
[employer] [facility] [place] [facility]   PATIENT NAME: Erin [name]    MR#:  [id number]  DATE OF BIRTH:  02-10-1971  DATE OF ADMISSION:  12/08/2016  PRIMARY CARE PHYSICIAN: [facility] PRIMARY CARE   REQUESTING/REFERRING PHYSICIAN: Placido Sou  CHIEF COMPLAINT:   Chief Complaint  Patient presents with  . Chest Pain    HISTORY OF PRESENT ILLNESS: Erin [name]  is a 46 y.o. female with a known history of End-stage renal disease on hemodialysis, adrenal insufficiency, arthritis, asthma, depression, epilepsy, fibromyalgia, gastroesophageal reflux disease, chronic smoker, narcolepsy, myocardial infarction, recent pericardial effusion and was admitted to [facility] one month ago where she had pericardial window done.  She went for her last dialysis on last Thursday, which was one week ago. Claims that her dialysis staff misbehavior with her so she did not go on Saturday and Tuesday, today when she went to the dialysis center she complained to them that her chest is hurting and so they sent her to emergency room.  PAST MEDICAL HISTORY:   Past Medical History:  Diagnosis Date  . Adrenal insufficiency (HCC)   . Anemia   . Anxiety   . Arthritis    "ankles, knees, hips" (11/11/2016)  . Asthma   . Bipolar affect, depressed (HCC)   . Blind    "since age 70 months"  . Chronic bronchitis (HCC)   . Chronic headaches    Seeing Pain Management  . Chronic upper back pain   . Constipation   . Depression   . Epilepsy (HCC)    "no seizure in a long time" (11/11/2016)  . ESRD on dialysis [facility])    "TTS; Cheree Ditto" (11/11/2016)  . Fibromyalgia   . GERD (gastroesophageal reflux disease)   . Heart murmur   . History of benign pituitary tumor   . History of blood transfusion    "bleeding internally; never found out from where"  . History of DVT (deep vein thrombosis)    RLE  . History of hiatal hernia   . History of kidney stones   . Hyperlipidemia   . Hypothyroid   .  Myocardial infarction 09/2016  . Narcolepsy   . 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 Pericardial effusion    jan 2018- [place]  . Pericardial effusion with cardiac tamponade    Hattie Perch 11/10/2016  . Pneumonia X 2  . Renal insufficiency   . Stroke [facility]) ~ 2002   "right side weaker since" (11/11/2016)    PAST SURGICAL HISTORY: Past Surgical History:  Procedure Laterality Date  . BRAIN SURGERY  1976  . BREAST LUMPECTOMY Left 2001  . CARDIAC CATHETERIZATION Right 10/03/2016   Procedure: Coronary Angiography;  Surgeon: Laurier Nancy, MD;  Location: [facility] INVASIVE CV LAB;  Service: Cardiovascular;  Laterality: Right;  . CARDIAC CATHETERIZATION N/A 11/08/2016   Procedure: Pericardiocentesis;  Surgeon: Tonny Bollman, MD;  Location: [facility] INVASIVE CV LAB;  Service: Cardiovascular;  Laterality: N/A;  . DG AV DIALYSIS GRAFT DECLOT OR     X 4  . GIVENS CAPSULE STUDY N/A 02/29/2016   Procedure: GIVENS CAPSULE STUDY;  Surgeon: Elnita Maxwell, MD;  Location: [facility] ENDOSCOPY;  Service: Endoscopy;  Laterality: N/A;  . LAPAROSCOPIC CHOLECYSTECTOMY  2001  . PERICARDIOCENTESIS  11/08/2016   Pericardial effusion with tamponade Hattie Perch 11/10/2016  . PERIPHERAL VASCULAR CATHETERIZATION N/A 08/21/2015   Procedure: Dialysis/Perma Catheter Insertion;  Surgeon: Renford Dills, MD;  Location: [facility] INVASIVE CV LAB;  Service: Cardiovascular;  Laterality: N/A;  . PERIPHERAL VASCULAR CATHETERIZATION N/A 02/29/2016   Procedure: Dialysis/Perma Catheter Removal;  Surgeon: Annice Needy, MD;  Location: [facility] INVASIVE CV LAB;  Service: Cardiovascular;  Laterality: N/A;  . PERIPHERAL VASCULAR CATHETERIZATION N/A 10/28/2016   Procedure: A/V Fistulagram;  Surgeon: Renford Dills, MD;  Location: [facility] INVASIVE CV LAB;  Service: Cardiovascular;  Laterality: N/A;  . PERIPHERAL VASCULAR CATHETERIZATION N/A 10/28/2016   Procedure: A/V Shunt Intervention;  Surgeon: Renford Dills, MD;   Location: [facility] INVASIVE CV LAB;  Service: Cardiovascular;  Laterality: N/A;  . PITUITARY EXCISION  1976    SOCIAL HISTORY:  Social History  Substance Use Topics  . Smoking status: Current Every Day Smoker    Packs/day: 0.50    Years: 18.00    Types: Cigarettes  . Smokeless tobacco: Never Used  . Alcohol use 3.6 oz/week    6 Shots of liquor per week    FAMILY HISTORY:  Family History  Problem Relation Age of Onset  . Heart attack Mother   . Migraines Mother   . Aneurysm Mother   . Diabetes Father     DRUG ALLERGIES:  Allergies  Allergen Reactions  . Dhea [Nutritional Supplements] Anaphylaxis    Patient states medication is DHE for migraines, not DHEA  . Demerol [Meperidine] Other (See Comments)    Reaction:  Hallucinations   . Floxin [Ofloxacin] Other (See Comments)    Reaction:  Hallucinations   . Nsaids Nausea And Vomiting and Swelling  . Nubain [Nalbuphine Hcl] Other (See Comments)    Reaction:  Hallucinations   . Phenergan [Promethazine Hcl] Other (See Comments)    Reaction:  Restless legs   . Stadol [Butorphanol] Other (See Comments)    Reaction:  Hallucinations   . Erythromycin Diarrhea and Rash    REVIEW OF SYSTEMS:   CONSTITUTIONAL: No fever, fatigue or weakness.  EYES: No blurred or double vision.  EARS, NOSE, AND THROAT: No tinnitus or ear pain.  RESPIRATORY: No cough, shortness of breath, wheezing or hemoptysis.  CARDIOVASCULAR: Positive for chest pain, no orthopnea, edema.  GASTROINTESTINAL: No nausea, vomiting, diarrhea or abdominal pain.  GENITOURINARY: No dysuria, hematuria.  ENDOCRINE: No polyuria, nocturia,  HEMATOLOGY: No anemia, easy bruising or bleeding SKIN: No rash or lesion. MUSCULOSKELETAL: No joint pain or arthritis.   NEUROLOGIC: No tingling, numbness, weakness.  PSYCHIATRY: No anxiety or depression.   MEDICATIONS AT HOME:  Prior to Admission medications   Medication Sig Start Date End Date Taking? Authorizing Provider   AMOXICILLIN PO Take by mouth.   Yes Historical Provider, MD  fenofibrate (TRICOR) 145 MG tablet Take 145 mg by mouth every other day.   Yes Historical Provider, MD  fludrocortisone (FLORINEF) 0.1 MG tablet Take 0.1 mg by mouth daily.   Yes Historical Provider, MD  furosemide (LASIX) 40 MG tablet Take 40 mg by mouth 2 (two) times daily.   Yes Historical Provider, MD  gabapentin (NEURONTIN) 100 MG capsule Take 400 mg by mouth at bedtime.   Yes Historical Provider, MD  hydrocortisone (CORTEF) 10 MG tablet Take 0.5-1 tablets (5-10 mg total) by mouth 2 (two) times daily. Pt takes 10 mg in the morning and 5 mg at bedtime. 11/14/16  Yes Janetta Hora, PA-C  levothyroxine (SYNTHROID, LEVOTHROID) 100 MCG tablet Take 100 mcg by mouth daily.    Yes Historical Provider, MD  loratadine (CLARITIN) 10 MG tablet Take 10 mg by mouth daily.   Yes Historical Provider, MD  lovastatin (MEVACOR) 20 MG  tablet Take 20 mg by mouth every evening.    Yes Historical Provider, MD  Melatonin 5 MG TABS Take 2 tablets by mouth at bedtime.   Yes Historical Provider, MD  nystatin ointment (MYCOSTATIN) Apply 1 application topically 2 (two) times daily. Apply to affected area (groin, abdominal fold). 06/30/16  Yes Standley Brooking, MD  ondansetron (ZOFRAN) 4 MG tablet Take 1 tablet (4 mg total) by mouth every 8 (eight) hours as needed for nausea or vomiting. 10/15/16  Yes Nita Sickle, MD  oxyCODONE (OXY IR/ROXICODONE) 5 MG immediate release tablet Take 1 tablet (5 mg total) by mouth every 6 (six) hours as needed for severe pain. 11/23/16  Yes Srikar Sudini, MD  pantoprazole (PROTONIX) 40 MG tablet Take 1 tablet (40 mg total) by mouth daily. 02/27/16  Yes [name], MD  risperidone (RISPERDAL) 4 MG tablet Take 2-4 mg by mouth 2 (two) times daily. Pt takes 4 mg in the morning and 2 mg at bedtime.   Yes Historical Provider, MD  sertraline (ZOLOFT) 50 MG tablet Take 50 mg by mouth daily.   Yes Historical Provider, MD  sevelamer  carbonate (RENVELA) 800 MG tablet Take 2,400 mg by mouth 2 (two) times daily.    Yes Historical Provider, MD  tamsulosin (FLOMAX) 0.4 MG CAPS capsule Take 1 capsule (0.4 mg total) by mouth daily. 11/23/16  Yes Srikar Sudini, MD  topiramate (TOPAMAX) 200 MG tablet Take 250 mg by mouth 2 (two) times daily. Pt takes with a 50 mg tablet.   Yes Historical Provider, MD  traZODone (DESYREL) 100 MG tablet Take 100 mg by mouth at bedtime.   Yes Historical Provider, MD  Vitamin D, Ergocalciferol, (DRISDOL) 50000 UNITS CAPS capsule Take 50,000 Units by mouth every 30 (thirty) days. Takes on the 8th of every month    Historical Provider, MD      PHYSICAL EXAMINATION:   VITAL SIGNS: Blood pressure (!) 166/78, pulse 69, temperature 97.9 F (36.6 C), temperature source Oral, resp. rate 16, height 4\' 10"  (1.473 m), weight 69.4 kg (153 lb), last menstrual period 11/21/2007, SpO2 98 %.  GENERAL:  46 y.o.-year-old patient lying in the bed with no acute distress.  EYES: Pupils equal, round, reactive to light and accommodation. No scleral icterus. Extraocular muscles intact.  HEENT: Head atraumatic, normocephalic. Oropharynx and nasopharynx clear.  NECK:  Supple, no jugular venous distention. No thyroid enlargement, no tenderness.  LUNGS: Normal breath sounds bilaterally, no wheezing, no crepitation. No use of accessory muscles of respiration.  CARDIOVASCULAR: S1, S2 normal. No murmurs, rubs, or gallops.  ABDOMEN: Soft, nontender, nondistended. Bowel sounds present. No organomegaly or mass.  EXTREMITIES: No pedal edema, cyanosis, or clubbing. Right arm AV graft present. NEUROLOGIC: Cranial nerves II through XII are intact. Muscle strength 4/5 in all extremities. Sensation intact. Gait not checked.  PSYCHIATRIC: The patient is alert and oriented x 3.  SKIN: No obvious rash, lesion, or ulcer.   LABORATORY PANEL:   CBC  Recent Labs Lab 12/08/16 1254  WBC 6.7  HGB 9.9*  HCT 29.0*  PLT 157  MCV 92.6  MCH  31.6  MCHC 34.2  RDW 19.8*   ------------------------------------------------------------------------------------------------------------------  Chemistries   Recent Labs Lab 12/08/16 1254  NA 131*  K 6.7*  CL 99*  CO2 20*  GLUCOSE 80  BUN 88*  CREATININE 7.56*  CALCIUM 7.8*   ------------------------------------------------------------------------------------------------------------------ estimated creatinine clearance is 7.9 mL/min (by C-G formula based on SCr of 7.56 mg/dL (H)). ------------------------------------------------------------------------------------------------------------------ No results for  input(s): TSH, T4TOTAL, T3FREE, THYROIDAB in the last 72 hours.  Invalid input(s): FREET3   Coagulation profile No results for input(s): INR, PROTIME in the last 168 hours. ------------------------------------------------------------------------------------------------------------------- No results for input(s): DDIMER in the last 72 hours. -------------------------------------------------------------------------------------------------------------------  Cardiac Enzymes  Recent Labs Lab 12/08/16 1254  TROPONINI 0.06*   ------------------------------------------------------------------------------------------------------------------ Invalid input(s): POCBNP  ---------------------------------------------------------------------------------------------------------------  Urinalysis    Component Value Date/Time   COLORURINE STRAW (A) 11/23/2016 1336   APPEARANCEUR CLEAR (A) 11/23/2016 1336   APPEARANCEUR CLEAR 11/15/2013 2000   LABSPEC 1.004 (L) 11/23/2016 1336   LABSPEC 1.005 11/15/2013 2000   PHURINE 9.0 (H) 11/23/2016 1336   GLUCOSEU NEGATIVE 11/23/2016 1336   GLUCOSEU NEGATIVE 11/15/2013 2000   HGBUR MODERATE (A) 11/23/2016 1336   BILIRUBINUR NEGATIVE 11/23/2016 1336   BILIRUBINUR NEGATIVE 11/15/2013 2000   KETONESUR NEGATIVE 11/23/2016 1336    PROTEINUR 100 (A) 11/23/2016 1336   NITRITE NEGATIVE 11/23/2016 1336   LEUKOCYTESUR NEGATIVE 11/23/2016 1336   LEUKOCYTESUR NEGATIVE 11/15/2013 2000     RADIOLOGY: Dg Chest 2 View  Result Date: 12/08/2016 CLINICAL DATA:  Left-sided chest pain EXAM: CHEST  2 VIEW COMPARISON:  November 19, 2016 FINDINGS: Stable cardiomegaly. The hila and mediastinum are normal. No pulmonary nodules, masses, or focal infiltrates. No overt edema. IMPRESSION: Stable cardiomegaly. Electronically Signed   By: Gerome Sam III M.D   On: 12/08/2016 13:25    EKG: Orders placed or performed during the hospital encounter of 12/08/16  . EKG 12-Lead  . EKG 12-Lead  . ED EKG within 10 minutes  . ED EKG within 10 minutes    IMPRESSION AND PLAN:  * Hyperkalemia   Patient missed 2 sessions of hemodialysis.   She does not have clinical symptoms of fluid overload at this time.   Nephrologist was contacted by ER physician, patient is going to receive hemodialysis today.   Patient also complains about misbehaving hemodialysis staff at her outpatient center, nephrologist might need to look into that.  * Complaint of chest pain   Patient was recently admitted for pericardial effusion at [facility] and she had pericardial window done there ( Dr.Copper 11/08/16- 550 ml bloody fluid was removed)   We will monitor on telemetry, follow serial troponin and repeat echocardiogram on her.   Continue baseline cardiac medications at this time.  * Complain of chronic pain   Patient is complaining mainly about pain in her right hip and also for her chest pain and she is asking me to give her Dilaudid.   She seems to be having pain seeking behavior, I would like not to give Dilaudid at this time and continue her home medication oxycodone on an as needed basis.  * Hyperlipidemia   Continue statin.  * End-stage renal disease on hemodialysis   Nephrology consult to help with hemodialysis.  * Active smoking   Counseled to  quit smoking for 4 minutes.  All the records are reviewed and case discussed with ED provider. Management plans discussed with the patient, family and they are in agreement.  CODE STATUS: Full code  Code Status History    Date Active Date Inactive Code Status Order ID Comments User Context   11/19/2016  7:38 PM 11/24/2016  3:32 PM Full Code [id number]  Katharina Caper, MD Inpatient   11/08/2016  1:41 PM 11/14/2016  4:56 PM Full Code [id number]  Simonne Martinet, NP Inpatient   10/26/2016  9:44 PM 10/29/2016  9:58 PM Full Code [id number]  Tonye Royalty, DO  Inpatient   10/02/2016  5:09 AM 10/04/2016  7:05 PM Full Code [id number]  Arnaldo Natal, MD Inpatient   06/27/2016  8:27 PM 06/30/2016  5:15 PM Full Code [id number]  Delano Metz, MD Inpatient   03/04/2016  6:04 AM 03/07/2016  6:24 PM Full Code [id number]  Oralia Manis, MD Inpatient   02/25/2016  3:36 PM 03/01/2016  3:06 PM Full Code [id number]  Enedina Finner, MD Inpatient       TOTAL TIME TAKING CARE OF THIS PATIENT: 50 minutes.    Altamese Dilling M.D on 12/08/2016   Between 7am to 6pm - Pager - [phone]  After 6pm go to [email or web address] - password [email or web address]  [facility]  Office  [phone]  CC: Primary care physician; [facility] PRIMARY CARE   Note: This dictation was prepared with Dragon dictation along with smaller phrase technology. Any transcriptional errors that result from this process are unintentional.

## 2016-12-09 ENCOUNTER — Inpatient Hospital Stay (HOSPITAL_COMMUNITY)
Admit: 2016-12-09 | Discharge: 2016-12-09 | Disposition: A | Discharge status: Home / Self Care | Payer: Medicare Other | Attending: Internal Medicine | Admitting: Internal Medicine

## 2016-12-09 DIAGNOSIS — I319 Disease of pericardium, unspecified: Secondary | ICD-10-CM

## 2016-12-09 LAB — BASIC METABOLIC PANEL
ANION GAP: 7 (ref 5–15)
BUN: 43 mg/dL — ABNORMAL HIGH (ref 6–20)
CHLORIDE: 100 mmol/L — AB (ref 101–111)
CO2: 29 mmol/L (ref 22–32)
Calcium: 7.6 mg/dL — ABNORMAL LOW (ref 8.9–10.3)
Creatinine, Ser: 4.36 mg/dL — ABNORMAL HIGH (ref 0.44–1.00)
GFR calc Af Amer: 13 mL/min — ABNORMAL LOW (ref >60)
GFR, EST NON AFRICAN AMERICAN: 11 mL/min — AB (ref >60)
GLUCOSE: 67 mg/dL (ref 65–99)
POTASSIUM: 4.9 mmol/L (ref 3.5–5.1)
SODIUM: 136 mmol/L (ref 135–145)

## 2016-12-09 LAB — GLUCOSE, CAPILLARY
GLUCOSE-CAPILLARY: 101 mg/dL — AB (ref 65–99)
GLUCOSE-CAPILLARY: 236 mg/dL — AB (ref 65–99)
GLUCOSE-CAPILLARY: 35 mg/dL — AB (ref 65–99)
GLUCOSE-CAPILLARY: 82 mg/dL (ref 65–99)
Glucose-Capillary: 205 mg/dL — ABNORMAL HIGH (ref 65–99)
Glucose-Capillary: 32 mg/dL — CL (ref 65–99)
Glucose-Capillary: 72 mg/dL (ref 65–99)
Glucose-Capillary: 78 mg/dL (ref 65–99)

## 2016-12-09 LAB — TROPONIN I: TROPONIN I: 0.07 ng/mL — AB (ref <0.03)

## 2016-12-09 LAB — CBC
HEMATOCRIT: 25.5 % — AB (ref 35.0–47.0)
HEMOGLOBIN: 8.6 g/dL — AB (ref 12.0–16.0)
MCH: 31.1 pg (ref 26.0–34.0)
MCHC: 33.7 g/dL (ref 32.0–36.0)
MCV: 92.3 fL (ref 80.0–100.0)
Platelets: 133 10*3/uL — ABNORMAL LOW (ref 150–440)
RBC: 2.76 MIL/uL — ABNORMAL LOW (ref 3.80–5.20)
RDW: 19.9 % — ABNORMAL HIGH (ref 11.5–14.5)
WBC: 7.2 10*3/uL (ref 3.6–11.0)

## 2016-12-09 LAB — ECHOCARDIOGRAM COMPLETE
Height: 58 in
Weight: 2448 oz

## 2016-12-09 LAB — HEPATITIS B SURFACE ANTIGEN: HEP B S AG: NEGATIVE

## 2016-12-09 MED ORDER — NICOTINE 14 MG/24HR TD PT24: 14.0000 mg | MEDICATED_PATCH | Freq: Every day | TRANSDERMAL | Status: DC

## 2016-12-09 NOTE — Progress Notes (Signed)
*  PRELIMINARY RESULTS* Echocardiogram 2D Echocardiogram has been performed.  Erin [name] 12/09/2016, 9:53 AM

## 2016-12-09 NOTE — Progress Notes (Addendum)
Patient discharged back to [facility], report given to Janeann Merl (Nurse) IV discontinued site clean dry and intact. Patient is alert and oriented no acute distress noted. Patient to continue Renal diet at [facility]. Patient taken to [place] by patient personal transportation

## 2016-12-09 NOTE — Progress Notes (Signed)
On-call Hospitalist paged for elevated Troponin. Waiting callback. Windy Carina, RN 12:52 AM 12/09/2016

## 2016-12-09 NOTE — Care Management (Signed)
Susann Givens HD liaison sent clinical and notified of discharge.

## 2016-12-09 NOTE — NC FL2 (Signed)
[place] MEDICAID FL2 LEVEL OF CARE SCREENING TOOL     IDENTIFICATION  Patient Name: Erin [name] Birthdate: Jul 25, 1971 Sex: female Admission Date (Current Location): 12/08/2016  [facility] and [place] Number:  Chiropodist and Address:  [facility], [address] [place], [place] [address]      Provider Number: [id number]  Attending Physician Name and Address:  Houston Siren, MD  Relative Name and Phone Number:       Current Level of Care: Hospital Recommended Level of Care: Skilled Nursing Facility Prior Approval Number:    Date Approved/Denied:   PASRR Number:    Discharge Plan: SNF    Current Diagnoses: Patient Active Problem List   Diagnosis Date Noted  . Hyperkalemia 11/19/2016  . Elevated troponin 11/19/2016  . ESRD (end stage renal disease) (HCC) 11/19/2016  . Shock circulatory (HCC) 11/08/2016  . Cardiac/pericardial tamponade   . Chest pain 10/02/2016  . Palliative care encounter   . Goals of care, counseling/discussion   . Seizure disorder (HCC) 06/27/2016  . Blindness 06/27/2016  . Chronic pain 06/27/2016  . History of CVA (cerebrovascular accident) 06/27/2016  . Hypothyroidism 03/04/2016  . GERD (gastroesophageal reflux disease) 03/04/2016  . End stage renal disease on dialysis (HCC) 03/04/2016  . Bipolar 1 disorder, mixed, moderate (HCC) 02/26/2016  . 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 Anemia 02/25/2016    Orientation RESPIRATION BLADDER Height & Weight     Self, Time, Situation, Place  Normal Continent Weight:  (Patient on strecher) Height:  4\' 10"  (147.3 cm)  BEHAVIORAL SYMPTOMS/MOOD NEUROLOGICAL BOWEL NUTRITION STATUS   (none) Convulsions/Seizures Continent Diet (regular)  AMBULATORY STATUS COMMUNICATION OF NEEDS Skin   Limited Assist Verbally Normal                       Personal Care Assistance Level of Assistance   Dressing, Bathing Bathing Assistance: Limited assistance   Dressing Assistance: Limited assistance     Functional Limitations Info  Sight Sight Info: Impaired        SPECIAL CARE FACTORS FREQUENCY  PT (By licensed PT)                    Contractures Contractures Info: Not present    Additional Factors Info                  Current Medications (12/09/2016):  This is the current hospital active medication list Current Facility-Administered Medications  Medication Dose Route Frequency Provider Last Rate Last Dose  . [START ON 12/10/2016] epoetin alfa (EPOGEN,PROCRIT) injection 10,000 Units  10,000 Units Intravenous Q T,Th,Sa-HD Lamont Dowdy, MD      . fenofibrate tablet 160 mg  160 mg Oral Daily Altamese Dilling, MD   160 mg at 12/09/16 1021  . furosemide (LASIX) tablet 40 mg  40 mg Oral BID Altamese Dilling, MD   40 mg at 12/09/16 5248  . gabapentin (NEURONTIN) capsule 400 mg  400 mg Oral QHS Altamese Dilling, MD   400 mg at 12/08/16 2136  . heparin injection 5,000 Units  5,000 Units Subcutaneous Q8H Altamese Dilling, MD      . hydrocortisone (CORTEF) tablet 10 mg  10 mg Oral Daymon Larsen, MD   10 mg at 12/09/16 1123  . hydrocortisone (CORTEF) tablet 5 mg  5 mg Oral QHS Altamese Dilling, MD   5 mg at 12/08/16 2259  . levothyroxine (  SYNTHROID, LEVOTHROID) tablet 100 mcg  100 mcg Oral QAC breakfast Altamese Dilling, MD   100 mcg at 12/09/16 0821  . loratadine (CLARITIN) tablet 10 mg  10 mg Oral Daily Altamese Dilling, MD   10 mg at 12/09/16 1021  . Melatonin TABS 10 mg  2 tablet Oral QHS Altamese Dilling, MD   10 mg at 12/08/16 2259  . [START ON 12/10/2016] nicotine (NICODERM CQ - dosed in mg/24 hours) patch 14 mg  14 mg Transdermal Daily Lamont Dowdy, MD      . nitroGLYCERIN (NITROSTAT) SL tablet 0.4 mg  0.4 mg Sublingual Q5 min PRN Rockne Menghini, MD   0.4 mg at 12/08/16 1414  . ondansetron (ZOFRAN) tablet 4  mg  4 mg Oral Q8H PRN Altamese Dilling, MD      . oxyCODONE (Oxy IR/ROXICODONE) immediate release tablet 5 mg  5 mg Oral Q6H PRN Altamese Dilling, MD   5 mg at 12/09/16 1610  . pantoprazole (PROTONIX) EC tablet 40 mg  40 mg Oral Daily Altamese Dilling, MD   40 mg at 12/09/16 1021  . pravastatin (PRAVACHOL) tablet 20 mg  20 mg Oral q1800 Altamese Dilling, MD      . risperiDONE (RISPERDAL) tablet 2 mg  2 mg Oral QHS Altamese Dilling, MD   2 mg at 12/08/16 2136  . risperiDONE (RISPERDAL) tablet 4 mg  4 mg Oral RU-E4V Altamese Dilling, MD   4 mg at 12/09/16 4098  . sertraline (ZOLOFT) tablet 50 mg  50 mg Oral Daily Altamese Dilling, MD   50 mg at 12/09/16 1021  . sevelamer carbonate (RENVELA) tablet 2,400 mg  2,400 mg Oral BID WC Altamese Dilling, MD   2,400 mg at 12/09/16 0821  . sodium chloride flush (NS) 0.9 % injection 3 mL  3 mL Intravenous Q12H Altamese Dilling, MD   3 mL at 12/08/16 2139  . sodium polystyrene (KAYEXALATE) 15 GM/60ML suspension 30 g  30 g Oral Once Rockne Menghini, MD      . tamsulosin (FLOMAX) capsule 0.4 mg  0.4 mg Oral Daily Altamese Dilling, MD   0.4 mg at 12/09/16 1021  . topiramate (TOPAMAX) tablet 250 mg  250 mg Oral BID Altamese Dilling, MD   250 mg at 12/09/16 1022  . traZODone (DESYREL) tablet 100 mg  100 mg Oral QHS Altamese Dilling, MD   100 mg at 12/08/16 2138  . Vitamin D (Ergocalciferol) (DRISDOL) capsule 50,000 Units  50,000 Units Oral Q30 days Altamese Dilling, MD   50,000 Units at 12/09/16 1123     Discharge Medications: Please see discharge summary for a list of discharge medications.  Relevant Imaging Results:  Relevant Lab Results:   Additional Information    York Spaniel, LCSW

## 2016-12-09 NOTE — Progress Notes (Signed)
[facility]  ROUNDING NOTE   Subjective:   Hemodialysis yesterday emergent for hyperkalemia. 2K bath. UF of 2.5 litres  Objective:  Vital signs in last 24 hours:  Temp:  [97.5 F (36.4 C)-99 F (37.2 C)] 98.3 F (36.8 C) (02/09 0903) Pulse Rate:  [52-83] 75 (02/09 0903) Resp:  [14-23] 18 (02/09 0500) BP: (119-181)/(52-90) 148/64 (02/09 0903) SpO2:  [98 %-100 %] 98 % (02/09 0903)  Weight change:  Filed Weights   12/08/16 1249  Weight: 69.4 kg (153 lb)    Intake/Output: I/O last 3 completed shifts: In: 123 [P.O.:120; I.V.:3] Out: 3100 [Urine:600; Other:2500]   Intake/Output this shift:  Total I/O In: -  Out: 100 [Urine:100]  Physical Exam: General: NAD,   Head: Normocephalic, atraumatic. Moist oral mucosal membranes  Eyes: Blind - not examined  Neck: Supple, trachea midline  Lungs:  Clear to auscultation  Heart: Regular rate and rhythm  Abdomen:  Soft, nontender,   Extremities: no peripheral edema.  Neurologic: Nonfocal, moving all four extremities  Skin: No lesions  Access: Left AVG    Basic Metabolic Panel:  Recent Labs Lab 12/08/16 1254 12/09/16 0428  NA 131* 136  K 6.7* 4.9  CL 99* 100*  CO2 20* 29  GLUCOSE 80 67  BUN 88* 43*  CREATININE 7.56* 4.36*  CALCIUM 7.8* 7.6*    Liver Function Tests: No results for input(s): AST, ALT, ALKPHOS, BILITOT, PROT, ALBUMIN in the last 168 hours. No results for input(s): LIPASE, AMYLASE in the last 168 hours. No results for input(s): AMMONIA in the last 168 hours.  CBC:  Recent Labs Lab 12/08/16 1254 12/09/16 0428  WBC 6.7 7.2  HGB 9.9* 8.6*  HCT 29.0* 25.5*  MCV 92.6 92.3  PLT 157 133*    Cardiac Enzymes:  Recent Labs Lab 12/08/16 1254 12/08/16 1548 12/08/16 2030 12/08/16 2348  TROPONINI 0.06* 0.06* 0.06* 0.07*    BNP: Invalid input(s): POCBNP  CBG:  Recent Labs Lab 12/08/16 2309 12/08/16 2353 12/09/16 0506 12/09/16 0729 12/09/16 1025  GLUCAP 124* 99 72 78 82     Microbiology: Results for orders placed or performed during the hospital encounter of 11/19/16  Urine culture     Status: Abnormal   Collection Time: 11/23/16  1:36 PM  Result Value Ref Range Status   Specimen Description URINE, RANDOM  Final   Special Requests NONE  Final   Culture 50,000 COLONIES/mL KLEBSIELLA OXYTOCA (A)  Final   Report Status 11/25/2016 FINAL  Final   Organism ID, Bacteria KLEBSIELLA OXYTOCA (A)  Final      Susceptibility   Klebsiella oxytoca - MIC*    AMPICILLIN >=32 RESISTANT Resistant     CEFAZOLIN 8 SENSITIVE Sensitive     CEFTRIAXONE <=1 SENSITIVE Sensitive     CIPROFLOXACIN <=0.25 SENSITIVE Sensitive     GENTAMICIN <=1 SENSITIVE Sensitive     IMIPENEM <=0.25 SENSITIVE Sensitive     NITROFURANTOIN <=16 SENSITIVE Sensitive     TRIMETH/SULFA <=20 SENSITIVE Sensitive     AMPICILLIN/SULBACTAM 16 INTERMEDIATE Intermediate     PIP/TAZO <=4 SENSITIVE Sensitive     Extended ESBL NEGATIVE Sensitive     * 50,000 COLONIES/mL KLEBSIELLA OXYTOCA    Coagulation Studies: No results for input(s): LABPROT, INR in the last 72 hours.  Urinalysis: No results for input(s): COLORURINE, LABSPEC, PHURINE, GLUCOSEU, HGBUR, BILIRUBINUR, KETONESUR, PROTEINUR, UROBILINOGEN, NITRITE, LEUKOCYTESUR in the last 72 hours.  Invalid input(s): APPERANCEUR    Imaging: Dg Chest 2 View  Result Date: 12/08/2016  CLINICAL DATA:  Left-sided chest pain EXAM: CHEST  2 VIEW COMPARISON:  November 19, 2016 FINDINGS: Stable cardiomegaly. The hila and mediastinum are normal. No pulmonary nodules, masses, or focal infiltrates. No overt edema. IMPRESSION: Stable cardiomegaly. Electronically Signed   By: Gerome Sam III M.D   On: 12/08/2016 13:25     Medications:    . [START ON 12/10/2016] epoetin (EPOGEN/PROCRIT) injection  10,000 Units Intravenous Q T,Th,Sa-HD  . fenofibrate  160 mg Oral Daily  . furosemide  40 mg Oral BID  . gabapentin  400 mg Oral QHS  . heparin  5,000 Units  Subcutaneous Q8H  . hydrocortisone  10 mg Oral BH-q7a  . hydrocortisone  5 mg Oral QHS  . levothyroxine  100 mcg Oral QAC breakfast  . loratadine  10 mg Oral Daily  . Melatonin  2 tablet Oral QHS  . [START ON 12/10/2016] nicotine  14 mg Transdermal Daily  . pantoprazole  40 mg Oral Daily  . pravastatin  20 mg Oral q1800  . risperiDONE  2 mg Oral QHS  . risperiDONE  4 mg Oral BH-q7a  . sertraline  50 mg Oral Daily  . sevelamer carbonate  2,400 mg Oral BID WC  . sodium chloride flush  3 mL Intravenous Q12H  . sodium polystyrene  30 g Oral Once  . tamsulosin  0.4 mg Oral Daily  . topiramate  250 mg Oral BID  . traZODone  100 mg Oral QHS  . Vitamin D (Ergocalciferol)  50,000 Units Oral Q30 days   nitroGLYCERIN, ondansetron, oxyCODONE  Assessment/ Plan:  [name] is a 46 y.o. white female  with End stage renal disease on hemodialysis, blindness, hypertension, diabetes mellitus type II, hyperlipidemia  TTS [facility] right arm AVG  1. End Stage Renal Disease with hyperkalemia: and noncompliance.  - Emergent hemodialysis yesterday. 2 K bath. Discussed importance of going to dialysis. Next treatment for tomorrow.   2. Hypotension - on hydrocortisone and fludrocortisone..   3. Anemia of chronic kidney disease: hemoglobin 8.6 - epo with treatment  4. Secondary Hyperparathyroidism: with hypocalcemia  - calcium carbonate BID - Continue sevelamer with meals.    LOS: 1 [name] 2/9/201812:59 PM

## 2016-12-09 NOTE — Care Management Important Message (Signed)
Important Message  Patient Details  Name: Erin [name] MRN: [id number] Date of Birth: 1970-12-02   Medicare Important Message Given:  Yes    Chapman Fitch, RN 12/09/2016, 11:36 AM

## 2016-12-09 NOTE — Clinical Social Work Note (Signed)
Clinical Social Work Assessment  Patient Details  Name: Erin [name] MRN: [id number] Date of Birth: 1971/08/15  Date of referral:  12/09/16               Reason for consult:  Facility Placement                Permission sought to share information with:  Facility Industrial/product designer granted to share information::  Yes, Verbal Permission Granted  Name::        Agency::     Relationship::     Contact Information:     Housing/Transportation Living arrangements for the past 2 months:  Skilled Nursing Facility Source of Information:  Patient Patient Interpreter Needed:  None Criminal Activity/Legal Involvement Pertinent to Current Situation/Hospitalization:  No - Comment as needed Significant Relationships:  Friend Lives with:  Facility Resident Do you feel safe going back to the place where you live?  Yes Need for family participation in patient care:  No (Coment)  Care giving concerns:  Patient has been at [facility] for [employer]   Social Worker assessment / plan:  Patient is known to CSW as CSW placed patient at [facility] recently. MD to discharge patient to return to [facility] today. CSW spoke with patient and she is feeling better and initially requested to by by ambulance but when CSW informed her that insurance might not cover it, she stated she would get someone to take her. Patient was able to get a friend take her last time when she went to [facility].  Employment status:  Disabled (Comment on whether or not currently receiving Disability) Insurance information:  Medicare PT Recommendations:    Information / Referral to community resources:     Patient/Family's Response to care:  Patient expressed appreciation for CSW visit.  Patient/Family's Understanding of and Emotional Response to Diagnosis, Current Treatment, and Prognosis:  Patient is glad to be feeling better and discharging.  Emotional Assessment Appearance:     Attitude/Demeanor/Rapport:   (pleasant) Affect (typically observed):  Calm, Appropriate Orientation:  Oriented to Self, Oriented to  Time, Oriented to Situation, Oriented to Place Alcohol / Substance use:  Not Applicable Psych involvement (Current and /or in the community):  No (Comment)  Discharge Needs  Concerns to be addressed:  Care Coordination Readmission within the last 30 days:  Yes Current discharge risk:  None Barriers to Discharge:  No Barriers Identified   York Spaniel, LCSW 12/09/2016, 12:27 PM

## 2016-12-09 NOTE — Discharge Summary (Addendum)
[facility] [place] [facility]   PATIENT NAME: Erin [name]    MR#:  [id number]  DATE OF BIRTH:  09/28/1971  DATE OF ADMISSION:  12/08/2016 ADMITTING PHYSICIAN: Altamese Dilling, MD  DATE OF DISCHARGE: 12/09/2016  PRIMARY CARE PHYSICIAN: [facility]    ADMISSION DIAGNOSIS:  Hyperkalemia [E87.5] Nausea [R11.0] Left sided chest pain [R07.9]  DISCHARGE DIAGNOSIS:  Principal Problem:   Hyperkalemia Active Problems:   Chest pain   SECONDARY DIAGNOSIS:   Past Medical History:  Diagnosis Date  . Adrenal insufficiency (HCC)   . Anemia   . Anxiety   . Arthritis    "ankles, knees, hips" (11/11/2016)  . Asthma   . Bipolar affect, depressed (HCC)   . Blind    "since age 18 months"  . Chronic bronchitis (HCC)   . Chronic headaches    Seeing Pain Management  . Chronic upper back pain   . Constipation   . Depression   . Epilepsy (HCC)    "no seizure in a long time" (11/11/2016)  . ESRD on dialysis [facility])    "TTS; [facility]" (11/11/2016)  . Fibromyalgia   . GERD (gastroesophageal reflux disease)   . Heart murmur   . History of benign pituitary tumor   . History of blood transfusion    "bleeding internally; never found out from where"  . History of DVT (deep vein thrombosis)    RLE  . History of hiatal hernia   . History of kidney stones   . Hyperlipidemia   . Hypothyroid   . Myocardial infarction 09/2016  . Narcolepsy   . 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 Pericardial effusion    jan 2018- [place]  . Pericardial effusion with cardiac tamponade    [facility] Perch 11/10/2016  . Pneumonia X 2  . Renal insufficiency   . Stroke [facility]) ~ 2002   "right side weaker since" (11/11/2016)    HOSPITAL COURSE:   46 year old female with past medical history of end-stage renal disease on hemodialysis, medical noncompliance, history of DVT, hypertension, hyperlipidemia, panhypopituitarism, bipolar disorder,  depression who presented to the hospital due to hyperkalemia.  1. Hyperkalemia - due to ESRD and patient being noncompliant with her dialysis. -Patient received hemodialysis and her potassium level has now improved and normalized. - no ECG changes consistent with Hyperkalemia.   2. Chest Pain - this was atypical CP.   - CE have been minimally elevated.  NO acute symptoms presently and pt. Is being discharged to SNF.   3. Hx of Panhypopitiutarism - cont. Florinef, hydrocortisone.  4. Secondary hyperparathyroidism-continue Renvela.  5. End-stage renal disease on hemodialysis-patient will continue her dialysis on Tuesday Thursday and Saturday.  6. Hypothyroidism-patient will resume her Synthroid.  7. Bipolar disorder-patient will resume her Risperdal, Topamax.  8. Hyperlipidemia - cont. Tricor.    DISCHARGE CONDITIONS:   Full Code  CONSULTS OBTAINED:    DRUG ALLERGIES:   Allergies  Allergen Reactions  . Dhea [Nutritional Supplements] Anaphylaxis    Patient states medication is DHE for migraines, not DHEA  . Demerol [Meperidine] Other (See Comments)    Reaction:  Hallucinations   . Floxin [Ofloxacin] Other (See Comments)    Reaction:  Hallucinations   . Nsaids Nausea And Vomiting and Swelling  . Nubain [Nalbuphine Hcl] Other (See Comments)    Reaction:  Hallucinations   . Phenergan [Promethazine Hcl] Other (See Comments)    Reaction:  Restless legs   . Stadol [Butorphanol] Other (See  Comments)    Reaction:  Hallucinations   . Erythromycin Diarrhea and Rash    DISCHARGE MEDICATIONS:   Allergies as of 12/09/2016      Reactions   Dhea [nutritional Supplements] Anaphylaxis   Patient states medication is DHE for migraines, not DHEA   Demerol [meperidine] Other (See Comments)   Reaction:  Hallucinations    Floxin [ofloxacin] Other (See Comments)   Reaction:  Hallucinations    Nsaids Nausea And Vomiting, Swelling   Nubain [nalbuphine Hcl] Other (See Comments)    Reaction:  Hallucinations    Phenergan [promethazine Hcl] Other (See Comments)   Reaction:  Restless legs    Stadol [butorphanol] Other (See Comments)   Reaction:  Hallucinations    Erythromycin Diarrhea, Rash      Medication List    STOP taking these medications   AMOXICILLIN PO     TAKE these medications   fenofibrate 145 MG tablet Commonly known as:  TRICOR Take 145 mg by mouth every other day.   fludrocortisone 0.1 MG tablet Commonly known as:  FLORINEF Take 0.1 mg by mouth daily.   furosemide 40 MG tablet Commonly known as:  LASIX Take 40 mg by mouth 2 (two) times daily.   gabapentin 100 MG capsule Commonly known as:  NEURONTIN Take 400 mg by mouth at bedtime.   hydrocortisone 10 MG tablet Commonly known as:  CORTEF Take 0.5-1 tablets (5-10 mg total) by mouth 2 (two) times daily. Pt takes 10 mg in the morning and 5 mg at bedtime.   levothyroxine 100 MCG tablet Commonly known as:  SYNTHROID, LEVOTHROID Take 100 mcg by mouth daily.   loratadine 10 MG tablet Commonly known as:  CLARITIN Take 10 mg by mouth daily.   lovastatin 20 MG tablet Commonly known as:  MEVACOR Take 20 mg by mouth every evening.   Melatonin 5 MG Tabs Take 2 tablets by mouth at bedtime.   nystatin ointment Commonly known as:  MYCOSTATIN Apply 1 application topically 2 (two) times daily. Apply to affected area (groin, abdominal fold).   ondansetron 4 MG tablet Commonly known as:  ZOFRAN Take 1 tablet (4 mg total) by mouth every 8 (eight) hours as needed for nausea or vomiting.   oxyCODONE 5 MG immediate release tablet Commonly known as:  Oxy IR/ROXICODONE Take 1 tablet (5 mg total) by mouth every 6 (six) hours as needed for severe pain.   pantoprazole 40 MG tablet Commonly known as:  PROTONIX Take 1 tablet (40 mg total) by mouth daily.   risperidone 4 MG tablet Commonly known as:  RISPERDAL Take 2-4 mg by mouth 2 (two) times daily. Pt takes 4 mg in the morning and 2 mg at  bedtime.   sertraline 50 MG tablet Commonly known as:  ZOLOFT Take 50 mg by mouth daily.   sevelamer carbonate 800 MG tablet Commonly known as:  RENVELA Take 2,400 mg by mouth 2 (two) times daily.   tamsulosin 0.4 MG Caps capsule Commonly known as:  FLOMAX Take 1 capsule (0.4 mg total) by mouth daily.   topiramate 200 MG tablet Commonly known as:  TOPAMAX Take 250 mg by mouth 2 (two) times daily. Pt takes with a 50 mg tablet.   traZODone 100 MG tablet Commonly known as:  DESYREL Take 100 mg by mouth at bedtime.   Vitamin D (Ergocalciferol) 50000 units Caps capsule Commonly known as:  DRISDOL Take 50,000 Units by mouth every 30 (thirty) days. Takes on the 8th of every month  DISCHARGE INSTRUCTIONS:   DIET:  Renal diet  DISCHARGE CONDITION:  Stable  ACTIVITY:  Activity as tolerated  OXYGEN:  Home Oxygen: No.   Oxygen Delivery: room air  DISCHARGE LOCATION:  nursing home   If you experience worsening of your admission symptoms, develop shortness of breath, life threatening emergency, suicidal or homicidal thoughts you must seek medical attention immediately by calling 911 or calling your MD immediately  if symptoms less severe.  You Must read complete instructions/literature along with all the possible adverse reactions/side effects for all the Medicines you take and that have been prescribed to you. Take any new Medicines after you have completely understood and accpet all the possible adverse reactions/side effects.   Please note  You were cared for by a hospitalist during your hospital stay. If you have any questions about your discharge medications or the care you received while you were in the hospital after you are discharged, you can call the unit and asked to speak with the hospitalist on call if the hospitalist that took care of you is not available. Once you are discharged, your primary care physician will handle any further medical issues. Please  note that NO REFILLS for any discharge medications will be authorized once you are discharged, as it is imperative that you return to your primary care physician (or establish a relationship with a primary care physician if you do not have one) for your aftercare needs so that they can reassess your need for medications and monitor your lab values.     Today   Asking for Regular food.  POtassium level normalized. No other acute events overnight.   VITAL SIGNS:  Blood pressure (!) 148/64, pulse 75, temperature 98.3 F (36.8 C), temperature source Oral, resp. rate 18, height 4\' 10"  (1.473 m), weight 69.4 kg (153 lb), last menstrual period 11/21/2007, SpO2 98 %.  I/O:   Intake/Output Summary (Last 24 hours) at 12/09/16 1128 Last data filed at 12/09/16 0903  Gross per 24 hour  Intake              123 ml  Output             3200 ml  Net            -3077 ml    PHYSICAL EXAMINATION:  GENERAL:  46 y.o.-year-old patient lying in the bed in no acute distress.  EYES: Pupils equal, round, reactive to light and accommodation. No scleral icterus. Extraocular muscles intact.  HEENT: Head atraumatic, normocephalic. Oropharynx and nasopharynx clear.  NECK:  Supple, no jugular venous distention. No thyroid enlargement, no tenderness.  LUNGS: Normal breath sounds bilaterally, no wheezing, rales,rhonchi. No use of accessory muscles of respiration.  CARDIOVASCULAR: S1, S2 normal. No murmurs, rubs, or gallops.  ABDOMEN: Soft, non-tender, non-distended. Bowel sounds present. No organomegaly or mass.  EXTREMITIES: +1 pedal edema b/l, No cyanosis, or clubbing.  NEUROLOGIC: Cranial nerves II through XII are intact. No focal motor or sensory defecits b/l.  PSYCHIATRIC: The patient is alert and oriented x 3. Good affect.  SKIN: No obvious rash, lesion, or ulcer.   Right upper ext. AV fistula with good bruit and thrill.   DATA REVIEW:   CBC  Recent Labs Lab 12/09/16 0428  WBC 7.2  HGB 8.6*  HCT  25.5*  PLT 133*    Chemistries   Recent Labs Lab 12/09/16 0428  NA 136  K 4.9  CL 100*  CO2 29  GLUCOSE 67  BUN 43*  CREATININE 4.36*  CALCIUM 7.6*    Cardiac Enzymes  Recent Labs Lab 12/08/16 2348  TROPONINI 0.07*      RADIOLOGY:  Dg Chest 2 View  Result Date: 12/08/2016 CLINICAL DATA:  Left-sided chest pain EXAM: CHEST  2 VIEW COMPARISON:  November 19, 2016 FINDINGS: Stable cardiomegaly. The hila and mediastinum are normal. No pulmonary nodules, masses, or focal infiltrates. No overt edema. IMPRESSION: Stable cardiomegaly. Electronically Signed   By: Gerome Sam III M.D   On: 12/08/2016 13:25      Management plans discussed with the patient, family and they are in agreement.  CODE STATUS:     Code Status Orders        Start     Ordered   12/08/16 1959  Full code  Continuous     12/08/16 1958    Code Status History    Date Active Date Inactive Code Status Order ID Comments User Context   11/19/2016  7:38 PM 11/24/2016  3:32 PM Full Code [id number]  Katharina Caper, MD Inpatient   11/08/2016  1:41 PM 11/14/2016  4:56 PM Full Code [id number]  Simonne Martinet, NP Inpatient   10/26/2016  9:44 PM 10/29/2016  9:58 PM Full Code [id number]  Tonye Royalty, DO Inpatient   10/02/2016  5:09 AM 10/04/2016  7:05 PM Full Code [id number]  Arnaldo Natal, MD Inpatient   06/27/2016  8:27 PM 06/30/2016  5:15 PM Full Code [id number]  Delano Metz, MD Inpatient   03/04/2016  6:04 AM 03/07/2016  6:24 PM Full Code [id number]  Oralia Manis, MD Inpatient   02/25/2016  3:36 PM 03/01/2016  3:06 PM Full Code [id number]  Enedina Finner, MD Inpatient      TOTAL TIME TAKING CARE OF THIS PATIENT: 40 minutes.    Houston Siren M.D on 12/09/2016 at 11:28 AM  Between 7am to 6pm - Pager - [phone]  After 6pm go to [email or web address] - Therapist, nutritional Hospitalists  Office  [phone]  CC: Primary care physician; [facility] PRIMARY CARE

## 2016-12-09 NOTE — Progress Notes (Signed)
Dr. Anne Hahn notified of elevated Troponin of 0.07; acknowledged. Windy Carina, RN 1:04 AM 12/09/2016

## 2016-12-24 ENCOUNTER — Inpatient Hospital Stay
Admission: EM | Admit: 2016-12-24 | Discharge: 2016-12-25 | DRG: 640 | Disposition: A | Discharge status: Home / Self Care | Payer: Medicare Other | Attending: Internal Medicine | Admitting: Internal Medicine

## 2016-12-24 ENCOUNTER — Emergency Department: Payer: Medicare Other

## 2016-12-24 ENCOUNTER — Encounter: Payer: Self-pay | Admitting: Emergency Medicine

## 2016-12-24 DIAGNOSIS — F329 Major depressive disorder, single episode, unspecified: Secondary | ICD-10-CM | POA: Diagnosis present

## 2016-12-24 DIAGNOSIS — G894 Chronic pain syndrome: Secondary | ICD-10-CM | POA: Diagnosis present

## 2016-12-24 DIAGNOSIS — N2581 Secondary hyperparathyroidism of renal origin: Secondary | ICD-10-CM | POA: Diagnosis present

## 2016-12-24 DIAGNOSIS — M797 Fibromyalgia: Secondary | ICD-10-CM | POA: Diagnosis present

## 2016-12-24 DIAGNOSIS — Z79891 Long term (current) use of opiate analgesic: Secondary | ICD-10-CM | POA: Diagnosis not present

## 2016-12-24 DIAGNOSIS — G40909 Epilepsy, unspecified, not intractable, without status epilepticus: Secondary | ICD-10-CM | POA: Diagnosis present

## 2016-12-24 DIAGNOSIS — J45909 Unspecified asthma, uncomplicated: Secondary | ICD-10-CM | POA: Diagnosis present

## 2016-12-24 DIAGNOSIS — H547 Unspecified visual loss: Secondary | ICD-10-CM | POA: Diagnosis present

## 2016-12-24 DIAGNOSIS — K219 Gastro-esophageal reflux disease without esophagitis: Secondary | ICD-10-CM | POA: Diagnosis present

## 2016-12-24 DIAGNOSIS — Z79899 Other long term (current) drug therapy: Secondary | ICD-10-CM

## 2016-12-24 DIAGNOSIS — E1122 Type 2 diabetes mellitus with diabetic chronic kidney disease: Secondary | ICD-10-CM | POA: Diagnosis present

## 2016-12-24 DIAGNOSIS — D631 Anemia in chronic kidney disease: Secondary | ICD-10-CM | POA: Diagnosis present

## 2016-12-24 DIAGNOSIS — E039 Hypothyroidism, unspecified: Secondary | ICD-10-CM | POA: Diagnosis present

## 2016-12-24 DIAGNOSIS — Z8249 Family history of ischemic heart disease and other diseases of the circulatory system: Secondary | ICD-10-CM | POA: Diagnosis not present

## 2016-12-24 DIAGNOSIS — F17210 Nicotine dependence, cigarettes, uncomplicated: Secondary | ICD-10-CM | POA: Diagnosis present

## 2016-12-24 DIAGNOSIS — J42 Unspecified chronic bronchitis: Secondary | ICD-10-CM | POA: Diagnosis present

## 2016-12-24 DIAGNOSIS — N186 End stage renal disease: Secondary | ICD-10-CM | POA: Diagnosis present

## 2016-12-24 DIAGNOSIS — Z9115 Patient's noncompliance with renal dialysis: Secondary | ICD-10-CM

## 2016-12-24 DIAGNOSIS — F419 Anxiety disorder, unspecified: Secondary | ICD-10-CM | POA: Diagnosis present

## 2016-12-24 DIAGNOSIS — E875 Hyperkalemia: Principal | ICD-10-CM | POA: Diagnosis present

## 2016-12-24 DIAGNOSIS — Z86718 Personal history of other venous thrombosis and embolism: Secondary | ICD-10-CM

## 2016-12-24 DIAGNOSIS — R079 Chest pain, unspecified: Secondary | ICD-10-CM

## 2016-12-24 DIAGNOSIS — E785 Hyperlipidemia, unspecified: Secondary | ICD-10-CM | POA: Diagnosis present

## 2016-12-24 DIAGNOSIS — Z992 Dependence on renal dialysis: Secondary | ICD-10-CM | POA: Diagnosis not present

## 2016-12-24 DIAGNOSIS — Z87442 Personal history of urinary calculi: Secondary | ICD-10-CM

## 2016-12-24 DIAGNOSIS — R0789 Other chest pain: Secondary | ICD-10-CM | POA: Diagnosis present

## 2016-12-24 DIAGNOSIS — Z9119 Patient's noncompliance with other medical treatment and regimen: Secondary | ICD-10-CM | POA: Diagnosis not present

## 2016-12-24 DIAGNOSIS — Z833 Family history of diabetes mellitus: Secondary | ICD-10-CM

## 2016-12-24 DIAGNOSIS — I252 Old myocardial infarction: Secondary | ICD-10-CM | POA: Diagnosis not present

## 2016-12-24 LAB — BASIC METABOLIC PANEL
ANION GAP: 11 (ref 5–15)
Anion gap: 10 (ref 5–15)
BUN: 96 mg/dL — ABNORMAL HIGH (ref 6–20)
BUN: 34 mg/dL — AB (ref 6–20)
CALCIUM: 7.9 mg/dL — AB (ref 8.9–10.3)
CO2: 19 mmol/L — AB (ref 22–32)
CO2: 26 mmol/L (ref 22–32)
Calcium: 7.7 mg/dL — ABNORMAL LOW (ref 8.9–10.3)
Chloride: 103 mmol/L (ref 101–111)
Chloride: 96 mmol/L — ABNORMAL LOW (ref 101–111)
Creatinine, Ser: 7.97 mg/dL — ABNORMAL HIGH (ref 0.44–1.00)
Creatinine, Ser: 3.82 mg/dL — ABNORMAL HIGH (ref 0.44–1.00)
GFR calc Af Amer: 16 mL/min — ABNORMAL LOW (ref >60)
GFR calc non Af Amer: 6 mL/min — ABNORMAL LOW (ref >60)
GFR, EST AFRICAN AMERICAN: 6 mL/min — AB (ref >60)
GFR, EST NON AFRICAN AMERICAN: 13 mL/min — AB (ref >60)
GLUCOSE: 55 mg/dL — AB (ref 65–99)
Glucose, Bld: 111 mg/dL — ABNORMAL HIGH (ref 65–99)
POTASSIUM: 6.2 mmol/L — AB (ref 3.5–5.1)
POTASSIUM: 4 mmol/L (ref 3.5–5.1)
Sodium: 133 mmol/L — ABNORMAL LOW (ref 135–145)
Sodium: 132 mmol/L — ABNORMAL LOW (ref 135–145)

## 2016-12-24 LAB — GLUCOSE, CAPILLARY
Glucose-Capillary: 120 mg/dL — ABNORMAL HIGH (ref 65–99)
Glucose-Capillary: 49 mg/dL — ABNORMAL LOW (ref 65–99)
Glucose-Capillary: 65 mg/dL (ref 65–99)

## 2016-12-24 LAB — CBC
HEMATOCRIT: 25.7 % — AB (ref 35.0–47.0)
HEMOGLOBIN: 8.9 g/dL — AB (ref 12.0–16.0)
MCH: 31 pg (ref 26.0–34.0)
MCHC: 34.5 g/dL (ref 32.0–36.0)
MCV: 89.8 fL (ref 80.0–100.0)
Platelets: 167 10*3/uL (ref 150–440)
RBC: 2.86 MIL/uL — AB (ref 3.80–5.20)
RDW: 20 % — ABNORMAL HIGH (ref 11.5–14.5)
WBC: 7.9 10*3/uL (ref 3.6–11.0)

## 2016-12-24 LAB — PHOSPHORUS: Phosphorus: 5.9 mg/dL — ABNORMAL HIGH (ref 2.5–4.6)

## 2016-12-24 LAB — MRSA PCR SCREENING: MRSA BY PCR: NEGATIVE

## 2016-12-24 LAB — TSH: TSH: 0.067 u[IU]/mL — ABNORMAL LOW (ref 0.350–4.500)

## 2016-12-24 LAB — TROPONIN I: TROPONIN I: 0.06 ng/mL — AB (ref <0.03)

## 2016-12-24 MED ORDER — ACETAMINOPHEN 325 MG PO TABS: 650.0000 mg | ORAL_TABLET | Freq: Four times a day (QID) | ORAL | Status: DC | PRN

## 2016-12-24 MED ORDER — NYSTATIN 100000 UNIT/GM EX OINT
1.0000 "application " | TOPICAL_OINTMENT | Freq: Two times a day (BID) | CUTANEOUS | Status: DC
  Administered 2016-12-24 – 2016-12-25 (×2): 1 via TOPICAL
  Filled 2016-12-24: qty 30

## 2016-12-24 MED ORDER — FENTANYL CITRATE (PF) 100 MCG/2ML IJ SOLN
INTRAMUSCULAR | Status: AC
  Administered 2016-12-24: 75 ug via INTRAVENOUS
  Filled 2016-12-24: qty 2

## 2016-12-24 MED ORDER — ACETAMINOPHEN 650 MG RE SUPP: 650.0000 mg | Freq: Four times a day (QID) | RECTAL | Status: DC | PRN

## 2016-12-24 MED ORDER — HYDROMORPHONE HCL 1 MG/ML IJ SOLN
2.0000 mg | INTRAMUSCULAR | Status: DC | PRN
  Administered 2016-12-24: 1 mg via INTRAVENOUS
  Filled 2016-12-24: qty 2

## 2016-12-24 MED ORDER — HEPARIN SODIUM (PORCINE) 5000 UNIT/ML IJ SOLN
5000.0000 [IU] | Freq: Three times a day (TID) | INTRAMUSCULAR | Status: DC
  Filled 2016-12-24: qty 1

## 2016-12-24 MED ORDER — RISPERIDONE 1 MG PO TABS
2.0000 mg | ORAL_TABLET | Freq: Every day | ORAL | Status: DC
  Administered 2016-12-24: 2 mg via ORAL
  Filled 2016-12-24: qty 2

## 2016-12-24 MED ORDER — HYDROMORPHONE HCL 1 MG/ML IJ SOLN
1.0000 mg | INTRAMUSCULAR | Status: DC | PRN
  Administered 2016-12-24: 1 mg via INTRAVENOUS
  Filled 2016-12-24: qty 1

## 2016-12-24 MED ORDER — RISPERIDONE 1 MG PO TABS
4.0000 mg | ORAL_TABLET | Freq: Every day | ORAL | Status: DC
  Administered 2016-12-24 – 2016-12-25 (×2): 4 mg via ORAL
  Filled 2016-12-24 (×2): qty 4

## 2016-12-24 MED ORDER — EPOETIN ALFA 10000 UNIT/ML IJ SOLN
10000.0000 [IU] | INTRAMUSCULAR | Status: DC
  Administered 2016-12-24: 10000 [IU] via INTRAVENOUS
  Filled 2016-12-24: qty 1

## 2016-12-24 MED ORDER — TRAZODONE HCL 100 MG PO TABS
100.0000 mg | ORAL_TABLET | Freq: Every day | ORAL | Status: DC
  Filled 2016-12-24: qty 1

## 2016-12-24 MED ORDER — HYDROCORTISONE 10 MG PO TABS
10.0000 mg | ORAL_TABLET | Freq: Every day | ORAL | Status: DC
  Administered 2016-12-24 – 2016-12-25 (×2): 10 mg via ORAL
  Filled 2016-12-24 (×2): qty 1

## 2016-12-24 MED ORDER — NICOTINE 21 MG/24HR TD PT24
21.0000 mg | MEDICATED_PATCH | Freq: Every day | TRANSDERMAL | Status: DC
  Administered 2016-12-24 – 2016-12-25 (×2): 21 mg via TRANSDERMAL
  Filled 2016-12-24 (×2): qty 1

## 2016-12-24 MED ORDER — SERTRALINE HCL 50 MG PO TABS
50.0000 mg | ORAL_TABLET | Freq: Every day | ORAL | Status: DC
  Administered 2016-12-24 – 2016-12-25 (×2): 50 mg via ORAL
  Filled 2016-12-24 (×2): qty 1

## 2016-12-24 MED ORDER — TOPIRAMATE 25 MG PO TABS
250.0000 mg | ORAL_TABLET | Freq: Two times a day (BID) | ORAL | Status: DC
  Administered 2016-12-24 – 2016-12-25 (×2): 250 mg via ORAL
  Filled 2016-12-24 (×2): qty 2

## 2016-12-24 MED ORDER — NITROGLYCERIN 2 % TD OINT: 1.0000 [in_us] | TOPICAL_OINTMENT | Freq: Four times a day (QID) | TRANSDERMAL | Status: DC

## 2016-12-24 MED ORDER — RISPERIDONE 1 MG PO TABS: 2.0000 mg | ORAL_TABLET | Freq: Two times a day (BID) | ORAL | Status: DC

## 2016-12-24 MED ORDER — VITAMIN D (ERGOCALCIFEROL) 1.25 MG (50000 UNIT) PO CAPS: 50000.0000 [IU] | ORAL_CAPSULE | ORAL | Status: DC

## 2016-12-24 MED ORDER — PANTOPRAZOLE SODIUM 40 MG PO TBEC
40.0000 mg | DELAYED_RELEASE_TABLET | Freq: Every day | ORAL | Status: DC
  Administered 2016-12-24 – 2016-12-25 (×2): 40 mg via ORAL
  Filled 2016-12-24 (×3): qty 1

## 2016-12-24 MED ORDER — FLUDROCORTISONE ACETATE 0.1 MG PO TABS
0.1000 mg | ORAL_TABLET | Freq: Every day | ORAL | Status: DC
  Administered 2016-12-24 – 2016-12-25 (×2): 0.1 mg via ORAL
  Filled 2016-12-24 (×2): qty 1

## 2016-12-24 MED ORDER — DOCUSATE SODIUM 100 MG PO CAPS
100.0000 mg | ORAL_CAPSULE | Freq: Two times a day (BID) | ORAL | Status: DC
  Administered 2016-12-24 – 2016-12-25 (×2): 100 mg via ORAL
  Filled 2016-12-24 (×2): qty 1

## 2016-12-24 MED ORDER — TAMSULOSIN HCL 0.4 MG PO CAPS
0.4000 mg | ORAL_CAPSULE | Freq: Every day | ORAL | Status: DC
  Administered 2016-12-24 – 2016-12-25 (×2): 0.4 mg via ORAL
  Filled 2016-12-24 (×2): qty 1

## 2016-12-24 MED ORDER — ONDANSETRON HCL 4 MG/2ML IJ SOLN: 4.0000 mg | Freq: Four times a day (QID) | INTRAMUSCULAR | Status: DC | PRN

## 2016-12-24 MED ORDER — LORATADINE 10 MG PO TABS
10.0000 mg | ORAL_TABLET | Freq: Every day | ORAL | Status: DC
  Administered 2016-12-24 – 2016-12-25 (×2): 10 mg via ORAL
  Filled 2016-12-24 (×2): qty 1

## 2016-12-24 MED ORDER — SODIUM CHLORIDE 0.9% FLUSH
3.0000 mL | Freq: Two times a day (BID) | INTRAVENOUS | Status: DC
  Administered 2016-12-24: 3 mL via INTRAVENOUS

## 2016-12-24 MED ORDER — HYDROCORTISONE 10 MG PO TABS
5.0000 mg | ORAL_TABLET | Freq: Every day | ORAL | Status: DC
  Administered 2016-12-24: 5 mg via ORAL
  Filled 2016-12-24: qty 1

## 2016-12-24 MED ORDER — LEVOTHYROXINE SODIUM 100 MCG PO TABS
100.0000 ug | ORAL_TABLET | Freq: Every day | ORAL | Status: DC
  Administered 2016-12-24 – 2016-12-25 (×2): 100 ug via ORAL
  Filled 2016-12-24 (×2): qty 1

## 2016-12-24 MED ORDER — FENOFIBRATE 160 MG PO TABS
160.0000 mg | ORAL_TABLET | ORAL | Status: DC
  Administered 2016-12-25: 160 mg via ORAL
  Filled 2016-12-24: qty 1

## 2016-12-24 MED ORDER — FENTANYL CITRATE (PF) 100 MCG/2ML IJ SOLN
75.0000 ug | Freq: Once | INTRAMUSCULAR | Status: AC
  Administered 2016-12-24: 75 ug via INTRAVENOUS

## 2016-12-24 MED ORDER — FUROSEMIDE 40 MG PO TABS
40.0000 mg | ORAL_TABLET | Freq: Two times a day (BID) | ORAL | Status: DC
  Administered 2016-12-24 – 2016-12-25 (×2): 40 mg via ORAL
  Filled 2016-12-24 (×2): qty 1

## 2016-12-24 MED ORDER — MELATONIN 5 MG PO TABS
2.0000 | ORAL_TABLET | Freq: Every day | ORAL | Status: DC
  Filled 2016-12-24 (×2): qty 2

## 2016-12-24 MED ORDER — GABAPENTIN 400 MG PO CAPS
400.0000 mg | ORAL_CAPSULE | Freq: Every day | ORAL | Status: DC
  Administered 2016-12-24: 400 mg via ORAL
  Filled 2016-12-24: qty 1

## 2016-12-24 MED ORDER — OXYCODONE HCL 5 MG PO TABS
5.0000 mg | ORAL_TABLET | Freq: Four times a day (QID) | ORAL | Status: DC | PRN
  Administered 2016-12-24 – 2016-12-25 (×2): 5 mg via ORAL
  Filled 2016-12-24 (×3): qty 1

## 2016-12-24 MED ORDER — ONDANSETRON HCL 4 MG PO TABS: 4.0000 mg | ORAL_TABLET | Freq: Four times a day (QID) | ORAL | Status: DC | PRN

## 2016-12-24 MED ORDER — PRAVASTATIN SODIUM 20 MG PO TABS
20.0000 mg | ORAL_TABLET | Freq: Every day | ORAL | Status: DC
  Administered 2016-12-24: 20 mg via ORAL
  Filled 2016-12-24: qty 1

## 2016-12-24 MED ORDER — SEVELAMER CARBONATE 800 MG PO TABS
2400.0000 mg | ORAL_TABLET | Freq: Two times a day (BID) | ORAL | Status: DC
  Administered 2016-12-25: 2400 mg via ORAL
  Filled 2016-12-24: qty 3

## 2016-12-24 MED ORDER — MORPHINE SULFATE (PF) 2 MG/ML IV SOLN: 2.0000 mg | INTRAVENOUS | Status: DC | PRN

## 2016-12-24 NOTE — Plan of Care (Signed)
Problem: Pain Managment: Goal: General experience of comfort will improve Outcome: Progressing Pain is manage with 1mg  IV dilaudid, once dilaudid is given pt goes right to sleep, respirations prior to administration would be 16-18. Once dilaudid is administered respirations goes to 12-13, HR stays 74-82. Pt is arousable. 1 mg IV dilaudid was administered for 10 out of 10 chest pain per pt at 1539. Will keep close eye on pt.

## 2016-12-24 NOTE — Progress Notes (Signed)
[facility]  ROUNDING NOTE   Subjective:  Patient well known to [place] as an outpatient. She has missed her last 2 outpatient hemodialysis sessions. She presented today with chest pain.    Objective:  Vital signs in last 24 hours:  Temp:  [97.6 F (36.4 C)-98 F (36.7 C)] 98 F (36.7 C) (02/24 0931) Pulse Rate:  [73-84] 76 (02/24 1030) Resp:  [12-20] 13 (02/24 1030) BP: (129-154)/(80-97) 129/88 (02/24 1030) SpO2:  [98 %-100 %] 100 % (02/24 1030) Weight:  [70.3 kg (155 lb)-75.1 kg (165 lb 9.1 oz)] 75.1 kg (165 lb 9.1 oz) (02/24 0931)  Weight change:  Filed Weights   12/24/16 0431 12/24/16 0931  Weight: 70.3 kg (155 lb) 75.1 kg (165 lb 9.1 oz)    Intake/Output: No intake/output data recorded.   Intake/Output this shift:  Total I/O In: 240 [P.O.:240] Out: 100 [Urine:100]  Physical Exam: General: No acute distress  Head: Normocephalic, atraumatic. Moist oral mucosal membranes  Eyes: Anicteric  Neck: Supple, trachea midline  Lungs:  Scattered rhonchi, normal effort  Heart: S1S2 no rubs  Abdomen:  Soft, nontender, bowel sounds present  Extremities: 1+ peripheral edema.  Neurologic: Nonfocal, moving all four extremities  Skin: No lesions  Access: RUE AVG    Basic Metabolic Panel:  Recent Labs Lab 12/24/16 0517  NA 133*  K 6.2*  CL 103  CO2 19*  GLUCOSE 55*  BUN 96*  CREATININE 7.97*  CALCIUM 7.7*  PHOS 5.9*    Liver Function Tests: No results for input(s): AST, ALT, ALKPHOS, BILITOT, PROT, ALBUMIN in the last 168 hours. No results for input(s): LIPASE, AMYLASE in the last 168 hours. No results for input(s): AMMONIA in the last 168 hours.  CBC:  Recent Labs Lab 12/24/16 0517  WBC 7.9  HGB 8.9*  HCT 25.7*  MCV 89.8  PLT 167    Cardiac Enzymes:  Recent Labs Lab 12/24/16 0517  TROPONINI 0.06*    BNP: Invalid input(s): POCBNP  CBG: No results for input(s): GLUCAP in the last 168 hours.  Microbiology: Results for orders  placed or performed during the hospital encounter of 12/24/16  MRSA PCR Screening     Status: None   Collection Time: 12/24/16  9:15 AM  Result Value Ref Range Status   MRSA by PCR NEGATIVE NEGATIVE Final    Comment:        The GeneXpert MRSA Assay (FDA approved for NASAL specimens only), is one component of a comprehensive MRSA colonization surveillance program. It is not intended to diagnose MRSA infection nor to guide or monitor treatment for MRSA infections.     Coagulation Studies: No results for input(s): LABPROT, INR in the last 72 hours.  Urinalysis: No results for input(s): COLORURINE, LABSPEC, PHURINE, GLUCOSEU, HGBUR, BILIRUBINUR, KETONESUR, PROTEINUR, UROBILINOGEN, NITRITE, LEUKOCYTESUR in the last 72 hours.  Invalid input(s): APPERANCEUR    Imaging: Dg Chest Portable 1 View  Result Date: 12/24/2016 CLINICAL DATA:  46 year old female with chest pain. EXAM: PORTABLE CHEST 1 VIEW COMPARISON:  Chest radiograph dated 12/08/2016 FINDINGS: There is stable moderate cardiomegaly. There is silhouetting of the left hemidiaphragm which may be related to an enlarged left ventricle. Left lung base infiltrate is not excluded. Correlation with clinical exam recommended. PA and lateral views of the chest may provide better evaluation. A small left pleural effusion may be present. The right lung is clear. There is no pneumothorax. No acute osseous pathology. Vascular stents noted in the proximal arms bilaterally. IMPRESSION: 1. Stable cardiomegaly.  2. Silhouetting of the left hemidiaphragm may be related to cardiomegaly or represent developing infiltrate at the left lung base. Clinical correlation is recommended. PA and lateral views of the chest may provide better evaluation. 3. Possible trace left pleural effusion. Electronically Signed   By: Elgie Collard M.D.   On: 12/24/2016 05:35     Medications:    . docusate sodium  100 mg Oral BID  . [START ON 12/25/2016] fenofibrate  160  mg Oral QODAY  . fludrocortisone  0.1 mg Oral Daily  . furosemide  40 mg Oral BID  . gabapentin  400 mg Oral QHS  . heparin  5,000 Units Subcutaneous Q8H  . hydrocortisone  10 mg Oral Daily  . hydrocortisone  5 mg Oral QHS  . levothyroxine  100 mcg Oral Q0600  . loratadine  10 mg Oral Daily  . Melatonin  2 tablet Oral QHS  . nicotine  21 mg Transdermal Daily  . nitroGLYCERIN  1 inch Topical Q6H  . nystatin ointment  1 application Topical BID  . pantoprazole  40 mg Oral Daily  . pravastatin  20 mg Oral q1800  . risperiDONE  2 mg Oral QHS  . risperidone  4 mg Oral Daily  . sertraline  50 mg Oral Daily  . sevelamer carbonate  2,400 mg Oral BID WC  . sodium chloride flush  3 mL Intravenous Q12H  . tamsulosin  0.4 mg Oral Daily  . topiramate  250 mg Oral BID  . traZODone  100 mg Oral QHS  . [START ON 01/05/2017] Vitamin D (Ergocalciferol)  50,000 Units Oral Q30 days   acetaminophen **OR** acetaminophen, HYDROmorphone (DILAUDID) injection, ondansetron **OR** ondansetron (ZOFRAN) IV, oxyCODONE  Assessment/ Plan:  46 y.o. female  with End stage renal disease on hemodialysis, blindness, hypertension, diabetes mellitus type II, hyperlipidemia  TTS [facility] right arm AVG  1. End Stage Renal Disease with hyperkalemia: and noncompliance.  - Patient missed her last 2 outpt dialysis sessions. Patient seen and evaluated during dialysis now, UF target 3-3.5kg.  Will use 2K bath to treat hyperkalemia.  Pt counseled on the importance of maintaining normal dialysis schedule.  2. Hypotension - blood pressure acceptable at the moment, continue cortisone and fludrocortisone.  3. Anemia of chronic kidney disease: hgb 8.9, will start on epogen 10000 units IV with HD.   4. Secondary Hyperparathyroidism: check phos with HD today, further plan based upon labs.    LOS: 0 Johnella Crumm 2/24/201810:54 AM

## 2016-12-24 NOTE — Progress Notes (Signed)
Pre HD  

## 2016-12-24 NOTE — ED Provider Notes (Signed)
[facility] Emergency Department Provider Note  Time seen: 4:34 AM  I have reviewed the triage vital signs and the nursing notes.   HISTORY  Chief Complaint Chest Pain    HPI Erin [name] is a 46 y.o. female with multiple medical problems including end-stage renal disease on hemodialysis Tuesday, Thursday, Saturday, epilepsy, fibromyalgia, chronic pain, MI, who presents to the emergency department for chest pain. According to EMS and patient report she began experiencing chest pain approximately 2 hours ago. Describes the pain as central to left-sided chest pain and significant. Patient also states she has not been to dialysis for one week because she has not been feeling well this week. Denies current shortness of breath.  Past Medical History:  Diagnosis Date  . Adrenal insufficiency (HCC)   . Anemia   . Anxiety   . Arthritis    "ankles, knees, hips" (11/11/2016)  . Asthma   . Bipolar affect, depressed (HCC)   . Blind    "since age 57 months"  . Chronic bronchitis (HCC)   . Chronic headaches    Seeing Pain Management  . Chronic upper back pain   . Constipation   . Depression   . Epilepsy (HCC)    "no seizure in a long time" (11/11/2016)  . ESRD on dialysis [facility])    "TTS; [facility] Ditto" (11/11/2016)  . Fibromyalgia   . GERD (gastroesophageal reflux disease)   . Heart murmur   . History of benign pituitary tumor   . History of blood transfusion    "bleeding internally; never found out from where"  . History of DVT (deep vein thrombosis)    RLE  . History of hiatal hernia   . History of kidney stones   . Hyperlipidemia   . Hypothyroid   . Myocardial infarction 09/2016  . Narcolepsy   . 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 Pericardial effusion    jan 2018- [place]  . Pericardial effusion with cardiac tamponade    Hattie Perch 11/10/2016  . Pneumonia X 2  . Renal insufficiency   . Stroke [facility]) ~ 2002   "right side weaker since" (11/11/2016)    Patient Active Problem List   Diagnosis Date Noted  . Hyperkalemia 11/19/2016  . Elevated troponin 11/19/2016  . ESRD (end stage renal disease) (HCC) 11/19/2016  . Shock circulatory (HCC) 11/08/2016  . Cardiac/pericardial tamponade   . Chest pain 10/02/2016  . Palliative care encounter   . Goals of care, counseling/discussion   . Seizure disorder (HCC) 06/27/2016  . Blindness 06/27/2016  . Chronic pain 06/27/2016  . History of CVA (cerebrovascular accident) 06/27/2016  . Hypothyroidism 03/04/2016  . GERD (gastroesophageal reflux disease) 03/04/2016  . End stage renal disease on dialysis (HCC) 03/04/2016  . Bipolar 1 disorder, mixed, moderate (HCC) 02/26/2016  . 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 Anemia 02/25/2016    Past Surgical History:  Procedure Laterality Date  . BRAIN SURGERY  1976  . BREAST LUMPECTOMY Left 2001  . CARDIAC CATHETERIZATION Right 10/03/2016   Procedure: Coronary Angiography;  Surgeon: Laurier Nancy, MD;  Location: [facility] INVASIVE CV LAB;  Service: Cardiovascular;  Laterality: Right;  . CARDIAC CATHETERIZATION N/A 11/08/2016   Procedure: Pericardiocentesis;  Surgeon: Tonny Bollman, MD;  Location: [facility] INVASIVE CV LAB;  Service: Cardiovascular;  Laterality: N/A;  . DG AV DIALYSIS GRAFT DECLOT OR     X 4  . GIVENS CAPSULE STUDY N/A 02/29/2016  Procedure: GIVENS CAPSULE STUDY;  Surgeon: Elnita Maxwell, MD;  Location: [facility] ENDOSCOPY;  Service: Endoscopy;  Laterality: N/A;  . LAPAROSCOPIC CHOLECYSTECTOMY  2001  . PERICARDIOCENTESIS  11/08/2016   Pericardial effusion with tamponade Hattie Perch 11/10/2016  . PERIPHERAL VASCULAR CATHETERIZATION N/A 08/21/2015   Procedure: Dialysis/Perma Catheter Insertion;  Surgeon: Renford Dills, MD;  Location: [facility] INVASIVE CV LAB;  Service: Cardiovascular;  Laterality: N/A;  . PERIPHERAL VASCULAR CATHETERIZATION N/A  02/29/2016   Procedure: Dialysis/Perma Catheter Removal;  Surgeon: Annice Needy, MD;  Location: [facility] INVASIVE CV LAB;  Service: Cardiovascular;  Laterality: N/A;  . PERIPHERAL VASCULAR CATHETERIZATION N/A 10/28/2016   Procedure: A/V Fistulagram;  Surgeon: Renford Dills, MD;  Location: [facility] INVASIVE CV LAB;  Service: Cardiovascular;  Laterality: N/A;  . PERIPHERAL VASCULAR CATHETERIZATION N/A 10/28/2016   Procedure: A/V Shunt Intervention;  Surgeon: Renford Dills, MD;  Location: [facility] INVASIVE CV LAB;  Service: Cardiovascular;  Laterality: N/A;  . PITUITARY EXCISION  1976    Prior to Admission medications   Medication Sig Start Date End Date Taking? Authorizing Provider  fenofibrate (TRICOR) 145 MG tablet Take 145 mg by mouth every other day.    Historical Provider, MD  fludrocortisone (FLORINEF) 0.1 MG tablet Take 0.1 mg by mouth daily.    Historical Provider, MD  furosemide (LASIX) 40 MG tablet Take 40 mg by mouth 2 (two) times daily.    Historical Provider, MD  gabapentin (NEURONTIN) 100 MG capsule Take 400 mg by mouth at bedtime.    Historical Provider, MD  hydrocortisone (CORTEF) 10 MG tablet Take 0.5-1 tablets (5-10 mg total) by mouth 2 (two) times daily. Pt takes 10 mg in the morning and 5 mg at bedtime. 11/14/16   Janetta Hora, PA-C  levothyroxine (SYNTHROID, LEVOTHROID) 100 MCG tablet Take 100 mcg by mouth daily.     Historical Provider, MD  loratadine (CLARITIN) 10 MG tablet Take 10 mg by mouth daily.    Historical Provider, MD  lovastatin (MEVACOR) 20 MG tablet Take 20 mg by mouth every evening.     Historical Provider, MD  Melatonin 5 MG TABS Take 2 tablets by mouth at bedtime.    Historical Provider, MD  nystatin ointment (MYCOSTATIN) Apply 1 application topically 2 (two) times daily. Apply to affected area (groin, abdominal fold). 06/30/16   Standley Brooking, MD  ondansetron (ZOFRAN) 4 MG tablet Take 1 tablet (4 mg total) by mouth every 8 (eight) hours as needed for nausea  or vomiting. 10/15/16   Nita Sickle, MD  oxyCODONE (OXY IR/ROXICODONE) 5 MG immediate release tablet Take 1 tablet (5 mg total) by mouth every 6 (six) hours as needed for severe pain. 11/23/16   Srikar Sudini, MD  pantoprazole (PROTONIX) 40 MG tablet Take 1 tablet (40 mg total) by mouth daily. 02/27/16   [name], MD  risperidone (RISPERDAL) 4 MG tablet Take 2-4 mg by mouth 2 (two) times daily. Pt takes 4 mg in the morning and 2 mg at bedtime.    Historical Provider, MD  sertraline (ZOLOFT) 50 MG tablet Take 50 mg by mouth daily.    Historical Provider, MD  sevelamer carbonate (RENVELA) 800 MG tablet Take 2,400 mg by mouth 2 (two) times daily.     Historical Provider, MD  tamsulosin (FLOMAX) 0.4 MG CAPS capsule Take 1 capsule (0.4 mg total) by mouth daily. 11/23/16   Srikar Sudini, MD  topiramate (TOPAMAX) 200 MG tablet Take 250 mg by mouth 2 (two) times daily. Pt  takes with a 50 mg tablet.    Historical Provider, MD  traZODone (DESYREL) 100 MG tablet Take 100 mg by mouth at bedtime.    Historical Provider, MD  Vitamin D, Ergocalciferol, (DRISDOL) 50000 UNITS CAPS capsule Take 50,000 Units by mouth every 30 (thirty) days. Takes on the 8th of every month    Historical Provider, MD    Allergies  Allergen Reactions  . Dhea [Nutritional Supplements] Anaphylaxis    Patient states medication is DHE for migraines, not DHEA  . Demerol [Meperidine] Other (See Comments)    Reaction:  Hallucinations   . Floxin [Ofloxacin] Other (See Comments)    Reaction:  Hallucinations   . Nsaids Nausea And Vomiting and Swelling  . Nubain [Nalbuphine Hcl] Other (See Comments)    Reaction:  Hallucinations   . Phenergan [Promethazine Hcl] Other (See Comments)    Reaction:  Restless legs   . Stadol [Butorphanol] Other (See Comments)    Reaction:  Hallucinations   . Erythromycin Diarrhea and Rash    Family History  Problem Relation Age of Onset  . Heart attack Mother   . Migraines Mother   . Aneurysm Mother    . Diabetes Father     Social History Social History  Substance Use Topics  . Smoking status: Current Every Day Smoker    Packs/day: 0.50    Years: 18.00    Types: Cigarettes  . Smokeless tobacco: Never Used  . Alcohol use No    Review of Systems Constitutional: Negative for fever. Cardiovascular: Positive for central to left chest pain Respiratory: Negative for shortness of breath. Gastrointestinal: Negative for abdominal pain Neurological: Negative for headache 10-point ROS otherwise negative.  ____________________________________________   PHYSICAL EXAM:  VITAL SIGNS: ED Triage Vitals  Enc Vitals Group     BP 12/24/16 0432 (!) 151/89     Pulse Rate 12/24/16 0432 78     Resp 12/24/16 0432 16     Temp 12/24/16 0432 97.6 F (36.4 C)     Temp Source 12/24/16 0432 Oral     SpO2 12/24/16 0432 99 %     Weight 12/24/16 0431 155 lb (70.3 kg)     Height 12/24/16 0431 4\' 10"  (1.473 m)     Head Circumference --      Peak Flow --      Pain Score 12/24/16 0431 10     Pain Loc --      Pain Edu? --      Excl. in GC? --     Constitutional: Alert and oriented. Well appearing and in no distress. ENT   Head: Normocephalic and atraumatic.   Mouth/Throat: Mucous membranes are moist. Cardiovascular: Normal rate, regular rhythm. No murmur Respiratory: Normal respiratory effort without tachypnea nor retractions. Breath sounds are clear. Moderate chest tenderness palpation in the mid sternum to left side. Gastrointestinal: Soft and nontender. No distention.   Musculoskeletal: Diffuse tenderness palpation over all extremities. Neurologic:  Normal speech and language. No gross focal neurologic deficits  Skin:  Skin is warm, dry and intact.  Psychiatric: Mood and affect are normal.  ____________________________________________    EKG  EKG reviewed and interpreted by myself shows normal sinus rhythm at 79 bpm, narrow QRS, normal axis, largely normal intervals with  nonspecific ST changes. No ST elevation.  ____________________________________________    RADIOLOGY  IMPRESSION: 1. Stable cardiomegaly. 2. Silhouetting of the left hemidiaphragm may be related to cardiomegaly or represent developing infiltrate at the left lung base. Clinical correlation is  recommended. PA and lateral views of the chest may provide better evaluation. 3. Possible trace left pleural effusion.  ____________________________________________   INITIAL IMPRESSION / ASSESSMENT AND PLAN / ED COURSE  Pertinent labs & imaging results that were available during my care of the patient were reviewed by me and considered in my medical decision making (see chart for details).  Patient presents to the emergency department with chest pain. Patient states she is not been a dialysis in one week. Patient does have chest wall tenderness however she also has tenderness on nearly every part of her body that you push on. Patient has a history of fibromyalgia as well as chronic pain. She also has a history of cardiac disease or reports an MI of November 2017. We will check labs, cardiac enzymes, chest x-ray and continue to closely monitor in the emergency department.  Patient's labs show an elevated troponin which is baseline for the patient. She does have an elevated potassium to 6.2. Continues to state some chest pain. I discussed the patient with nephrology who states given her elevated potassium and poor reliability they would recommend bringing her in to the hospital for dialysis prior to discharge. I discussed this with the hospitalist will be admitting the patient.  ____________________________________________   FINAL CLINICAL IMPRESSION(S) / ED DIAGNOSES  Chest pain Hyperkalemia   Minna Antis, MD 12/24/16 [id number]

## 2016-12-24 NOTE — ED Notes (Signed)
3 unsuccessful IV attempts, Phyllis Ginger at bedside to attempt [place] IV

## 2016-12-24 NOTE — Progress Notes (Addendum)
RN went to reassess pt and notice that her arms were twitching, there were no CBG order check; however, RN check CBG at 1743 and was 49. RN gave pt 220 of apple juice (per pt request).  pt is alert. CBG rechecked at 1810 and came up to 120. Prime Doctor notified of pt not having CBG order check. New orders placed by Prime Doctor. Will continue to monitor pt closely.   Najma Bozarth [employer]

## 2016-12-24 NOTE — ED Notes (Signed)
Dr. Lenard Lance notified of pt's pain level, no new orders at this time

## 2016-12-24 NOTE — ED Triage Notes (Signed)
Pt to rm 3 via EMS from home, report left sided chest pain radiating to left arm, report SOB.  Pt hemodialysis pt, no tx in 1 week.

## 2016-12-24 NOTE — H&P (Signed)
Erin [name] is an 46 y.o. female.   Chief Complaint: Chest pain HPI: The patient well-known to the hospitalist service with past medical history significant for end-stage renal disease on dialysis, blindness, hypothyroidism, past myocardial infarction and epilepsy presents emergency department complaining of chest pain. The pain is centralized and began after she awoke but while still laying in bed. It radiates to her left shoulder. She denies nausea, vomiting or diaphoresis. Notably the patient has not gone to dialysis for the last 6 treatments. She has been admitted to the hospital with similar complaints at least 6 times in the last 2 months. Presentation today is similar to previous occasions in which the patient had chest pain and elevated troponin after having missed dialysis. Laboratory evaluation also shows hyperkalemia. The nephrology service was contacted and agrees to continue dialysis as an inpatient which prompted the emergency department staff to call the hospitalist service for admission.  Past Medical History:  Diagnosis Date  . 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)   . Anemia   . Anxiety   . Arthritis    "ankles, knees, hips" (11/11/2016)  . Asthma   . Bipolar affect, depressed ([place])   . Blind    "since age 87 months"  . Chronic bronchitis ([place])   . Chronic headaches    Seeing Pain Management  . Chronic upper back pain   . Constipation   . Depression   . Epilepsy ([place])    "no seizure in a long time" (11/11/2016)  . ESRD on dialysis [facility])    "TTS; [facility]" (11/11/2016)  . Fibromyalgia   . GERD (gastroesophageal reflux disease)   . Heart murmur   . History of benign pituitary tumor   . History of blood transfusion    "bleeding internally; never found out from where"  . History of DVT (deep vein thrombosis)    RLE  . History of hiatal hernia   . History of kidney stones   . Hyperlipidemia   . Hypothyroid   . Myocardial infarction 09/2016  . Narcolepsy   .  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 Pericardial effusion    jan 2018- [place]  . Pericardial effusion with cardiac tamponade    Archie Endo 11/10/2016  . Pneumonia X 2  . Renal insufficiency   . Stroke [facility]) ~ 2002   "right side weaker since" (11/11/2016)    Past Surgical History:  Procedure Laterality Date  . BRAIN SURGERY  1976  . BREAST LUMPECTOMY Left 2001  . CARDIAC CATHETERIZATION Right 10/03/2016   Procedure: Coronary Angiography;  Surgeon: Dionisio David, MD;  Location: [place] [facility];  Service: Cardiovascular;  Laterality: Right;  . CARDIAC CATHETERIZATION N/A 11/08/2016   Procedure: Pericardiocentesis;  Surgeon: Sherren Mocha, MD;  Location: [place] [facility];  Service: Cardiovascular;  Laterality: N/A;  . DG AV DIALYSIS GRAFT DECLOT OR     X 4  . GIVENS CAPSULE STUDY N/A 02/29/2016   Procedure: GIVENS CAPSULE STUDY;  Surgeon: Josefine Class, MD;  Location: [facility] ENDOSCOPY;  Service: Endoscopy;  Laterality: N/A;  . LAPAROSCOPIC CHOLECYSTECTOMY  2001  . PERICARDIOCENTESIS  11/08/2016   Pericardial effusion with tamponade [facility] Endo 11/10/2016  . PERIPHERAL VASCULAR CATHETERIZATION N/A 08/21/2015   Procedure: Dialysis/Perma Catheter Insertion;  Surgeon: Katha Cabal, MD;  Location: [place] [facility] LAB;  Service: Cardiovascular;  Laterality: N/A;  . PERIPHERAL VASCULAR CATHETERIZATION N/A 02/29/2016   Procedure: Dialysis/Perma Catheter Removal;  Surgeon: Algernon Huxley, MD;  Location: [place]  CV LAB;  Service: Cardiovascular;  Laterality: N/A;  . PERIPHERAL VASCULAR CATHETERIZATION N/A 10/28/2016   Procedure: A/V Fistulagram;  Surgeon: Katha Cabal, MD;  Location: [place] [facility] LAB;  Service: Cardiovascular;  Laterality: N/A;  . PERIPHERAL VASCULAR CATHETERIZATION N/A 10/28/2016   Procedure: A/V Shunt Intervention;  Surgeon: Katha Cabal, MD;  Location: [place] CV LAB;  Service: Cardiovascular;  Laterality:  N/A;  . PITUITARY EXCISION  1976    Family History  Problem Relation Age of Onset  . Heart attack Mother   . Migraines Mother   . Aneurysm Mother   . Diabetes Father    Social History:  reports that she has been smoking Cigarettes.  She has a 9.00 pack-year smoking history. She has never used smokeless tobacco. She reports that she does not drink alcohol or use drugs.  Allergies:  Allergies  Allergen Reactions  . Dhea [Nutritional Supplements] Anaphylaxis    Patient states medication is DHE for migraines, not DHEA  . Demerol [Meperidine] Other (See Comments)    Reaction:  Hallucinations   . Floxin [Ofloxacin] Other (See Comments)    Reaction:  Hallucinations   . Nsaids Nausea And Vomiting and Swelling  . Nubain [Nalbuphine Hcl] Other (See Comments)    Reaction:  Hallucinations   . Phenergan [Promethazine Hcl] Other (See Comments)    Reaction:  Restless legs   . Stadol [Butorphanol] Other (See Comments)    Reaction:  Hallucinations   . Erythromycin Diarrhea and Rash    Prior to Admission medications   Medication Sig Start Date End Date Taking? Authorizing Provider  fenofibrate (TRICOR) 145 MG tablet Take 145 mg by mouth every other day.   Yes Historical Provider, MD  fludrocortisone (FLORINEF) 0.1 MG tablet Take 0.1 mg by mouth daily.   Yes Historical Provider, MD  furosemide (LASIX) 40 MG tablet Take 40 mg by mouth 2 (two) times daily.   Yes Historical Provider, MD  gabapentin (NEURONTIN) 100 MG capsule Take 400 mg by mouth at bedtime.   Yes Historical Provider, MD  hydrocortisone (CORTEF) 10 MG tablet Take 0.5-1 tablets (5-10 mg total) by mouth 2 (two) times daily. Pt takes 10 mg in the morning and 5 mg at bedtime. 11/14/16  Yes Eileen Stanford, PA-C  levothyroxine (SYNTHROID, LEVOTHROID) 100 MCG tablet Take 100 mcg by mouth daily.    Yes Historical Provider, MD  loratadine (CLARITIN) 10 MG tablet Take 10 mg by mouth daily.   Yes Historical Provider, MD  lovastatin  (MEVACOR) 20 MG tablet Take 20 mg by mouth every evening.    Yes Historical Provider, MD  Melatonin 5 MG TABS Take 2 tablets by mouth at bedtime.   Yes Historical Provider, MD  nystatin ointment (MYCOSTATIN) Apply 1 application topically 2 (two) times daily. Apply to affected area (groin, abdominal fold). 06/30/16  Yes Samuella Cota, MD  ondansetron (ZOFRAN) 4 MG tablet Take 1 tablet (4 mg total) by mouth every 8 (eight) hours as needed for nausea or vomiting. 10/15/16  Yes Rudene Re, MD  oxyCODONE (OXY IR/ROXICODONE) 5 MG immediate release tablet Take 1 tablet (5 mg total) by mouth every 6 (six) hours as needed for severe pain. 11/23/16  Yes Srikar Sudini, MD  pantoprazole (PROTONIX) 40 MG tablet Take 1 tablet (40 mg total) by mouth daily. 02/27/16  Yes Bettey Costa, MD  risperidone (RISPERDAL) 4 MG tablet Take 2-4 mg by mouth 2 (two) times daily. Pt takes 4 mg in the morning and 2  mg at bedtime.   Yes Historical Provider, MD  sertraline (ZOLOFT) 50 MG tablet Take 50 mg by mouth daily.   Yes Historical Provider, MD  sevelamer carbonate (RENVELA) 800 MG tablet Take 2,400 mg by mouth 2 (two) times daily.    Yes Historical Provider, MD  topiramate (TOPAMAX) 200 MG tablet Take 250 mg by mouth 2 (two) times daily. Pt takes with a 50 mg tablet.   Yes Historical Provider, MD  traZODone (DESYREL) 100 MG tablet Take 100 mg by mouth at bedtime.   Yes Historical Provider, MD  Vitamin D, Ergocalciferol, (DRISDOL) 50000 UNITS CAPS capsule Take 50,000 Units by mouth every 30 (thirty) days. Takes on the 8th of every month   Yes Historical Provider, MD  tamsulosin (FLOMAX) 0.4 MG CAPS capsule Take 1 capsule (0.4 mg total) by mouth daily. 11/23/16   Hillary Bow, MD     Results for orders placed or performed during the hospital encounter of 12/24/16 (from the past 48 hour(s))  Basic metabolic panel     Status: Abnormal   Collection Time: 12/24/16  5:17 AM  Result Value Ref Range   Sodium 133 (L) 135 - 145  mmol/L   Potassium 6.2 (H) 3.5 - 5.1 mmol/L    Comment: HEMOLYSIS AT THIS LEVEL MAY AFFECT RESULT   Chloride 103 101 - 111 mmol/L   CO2 19 (L) 22 - 32 mmol/L   Glucose, Bld 55 (L) 65 - 99 mg/dL   BUN 96 (H) 6 - 20 mg/dL   Creatinine, Ser 7.97 (H) 0.44 - 1.00 mg/dL   Calcium 7.7 (L) 8.9 - 10.3 mg/dL   GFR calc non Af Amer 6 (L) >60 mL/min   GFR calc Af Amer 6 (L) >60 mL/min    Comment: (NOTE) The eGFR has been calculated using the CKD EPI equation. This calculation has not been validated in all clinical situations. eGFR's persistently <60 mL/min signify possible Chronic Kidney Disease.    Anion gap 11 5 - 15  CBC     Status: Abnormal   Collection Time: 12/24/16  5:17 AM  Result Value Ref Range   WBC 7.9 3.6 - 11.0 K/uL   RBC 2.86 (L) 3.80 - 5.20 MIL/uL   Hemoglobin 8.9 (L) 12.0 - 16.0 g/dL   HCT 25.7 (L) 35.0 - 47.0 %   MCV 89.8 80.0 - 100.0 fL   MCH 31.0 26.0 - 34.0 pg   MCHC 34.5 32.0 - 36.0 g/dL   RDW 20.0 (H) 11.5 - 14.5 %   Platelets 167 150 - 440 K/uL  Troponin I     Status: Abnormal   Collection Time: 12/24/16  5:17 AM  Result Value Ref Range   Troponin I 0.06 (HH) <0.03 ng/mL    Comment: CRITICAL RESULT CALLED TO, READ BACK BY AND VERIFIED WITH CHRISTINE MARTIN AT 0569 12/24/16.PMH   Dg Chest Portable 1 View  Result Date: 12/24/2016 CLINICAL DATA:  46 year old female with chest pain. EXAM: PORTABLE CHEST 1 VIEW COMPARISON:  Chest radiograph dated 12/08/2016 FINDINGS: There is stable moderate cardiomegaly. There is silhouetting of the left hemidiaphragm which may be related to an enlarged left ventricle. Left lung base infiltrate is not excluded. Correlation with clinical exam recommended. PA and lateral views of the chest may provide better evaluation. A small left pleural effusion may be present. The right lung is clear. There is no pneumothorax. No acute osseous pathology. Vascular stents noted in the proximal arms bilaterally. IMPRESSION: 1. Stable cardiomegaly. 2.  Silhouetting  of the left hemidiaphragm may be related to cardiomegaly or represent developing infiltrate at the left lung base. Clinical correlation is recommended. PA and lateral views of the chest may provide better evaluation. 3. Possible trace left pleural effusion. Electronically Signed   By: Anner Crete M.D.   On: 12/24/2016 05:35    Review of Systems  Constitutional: Negative for chills and fever.  HENT: Negative for sore throat and tinnitus.   Eyes: Negative for blurred vision and redness.  Respiratory: Negative for cough and shortness of breath.   Cardiovascular: Positive for chest pain. Negative for palpitations, orthopnea and PND.  Gastrointestinal: Negative for abdominal pain, diarrhea, nausea and vomiting.  Genitourinary: Negative for dysuria, frequency and urgency.  Musculoskeletal: Negative for joint pain and myalgias.  Skin: Negative for rash.       No lesions  Neurological: Positive for weakness. Negative for speech change and focal weakness.  Endo/Heme/Allergies: Does not bruise/bleed easily.       No temperature intolerance  Psychiatric/Behavioral: Negative for depression and suicidal ideas.    Blood pressure 138/86, pulse 73, temperature 97.6 F (36.4 C), temperature source Oral, resp. rate 13, height 4' 10"  (1.473 m), weight 70.3 kg (155 lb), last menstrual period 11/21/2007, SpO2 98 %. Physical Exam  Vitals reviewed. Constitutional: She is oriented to person, place, and time. She appears well-developed and well-nourished. No distress.  HENT:  Head: Normocephalic and atraumatic.  Mouth/Throat: Oropharynx is clear and moist.  Eyes: Conjunctivae and EOM are normal. Pupils are equal, round, and reactive to light. No scleral icterus.  Neck: Normal range of motion. Neck supple. No JVD present. No tracheal deviation present. No thyromegaly present.  Cardiovascular: Normal rate, regular rhythm and normal heart sounds.  Exam reveals no gallop and no friction rub.   No  murmur heard. Respiratory: Effort normal and breath sounds normal.  GI: Soft. Bowel sounds are normal. She exhibits no distension. There is no tenderness.  Genitourinary:  Genitourinary Comments: Deferred  Musculoskeletal: Normal range of motion. She exhibits no edema.  Lymphadenopathy:    She has no cervical adenopathy.  Neurological: She is alert and oriented to person, place, and time. No cranial nerve deficit. She exhibits normal muscle tone.  Skin: Skin is warm and dry. No rash noted. No erythema.  Psychiatric: She has a normal mood and affect. Her behavior is normal. Judgment and thought content normal.     Assessment/Plan This is a 46 year old female admitted for hyperkalemia. 1. Hyperkalemia: With the setting of end-stage renal disease and missed dialysis appointments. The patient will undergo dialysis this morning. 2. Chest pain: Multifactorial as the patient has chronic pain syndrome, recurrent chest pain, and/or transient pulmonary edema. Usually resolves after dialysis. Monitor telemetry. The patient has not had arrhythmias thus far while under observation. Cardiology consultation at the discretion of the primary team 3. End-stage renal disease: Continue dialysis. (The patient is willing to undergo treatment, she just chooses not to go appointments when she is not feeling well). Nephrology consulted. Continue Lasix and Flomax to increase glomerular pressure and relieve urinary retention. Also continue Renvela 4. Hypothyroidism: Check TSH; continue Synthroid. 5. Seizure disorder: Continue Topamax 6. Dyslipidemia: Continue fenofibrate and statin therapy 7. History of hypotension: Continue hydrocortisone as well as Florinef 8. Depression: Continue Zoloft and Risperdal. Trazodone for sleep 9. DVT prophylaxis: Heparin 10. GI prophylaxis: PPI per home regimen The patient is a full code. Time spent on admission orders and patient care approximately 45 minutes  Harrie Foreman,  MD 12/24/2016, 6:45 AM

## 2016-12-24 NOTE — Progress Notes (Signed)
2 mg of IV dilaudid given to [place], RN for pt.'s pain level. When RN approached dialysis procedure area pt was not expressing any chest pain, but having generalized pain.   Jeramie Scogin [employer]

## 2016-12-24 NOTE — Progress Notes (Signed)
[employer] [facility] - [place] at [facility]                                                                                                                                                                                  Patient Demographics   Erin [name], is a 46 y.o. female, DOB - 1970/11/23, JKD:[id number]  Admit date - 12/24/2016   Admitting Physician Arnaldo Natal, MD  Outpatient Primary MD for the patient is [facility] PRIMARY CARE   LOS - 0  Subjective: Patient admitted with hyperkalemia, she has missed 3 dialysis according to her according to the note says missed 6 dialysis Patient has a history of recurrent chest pain has a negative cardiac catheter in December 2017. Called by nurse due to her crying due to chest pain.    Review of Systems:   CONSTITUTIONAL: No documented fever. No fatigue, weakness. No weight gain, no weight loss.  EYES: No blurry or double vision.  ENT: No tinnitus. No postnasal drip. No redness of the oropharynx.  RESPIRATORY: No cough, no wheeze, no hemoptysis. No dyspnea.  CARDIOVASCULAR:  +chest pain. No orthopnea. No palpitations. No syncope.  GASTROINTESTINAL: No nausea, no vomiting or diarrhea. No abdominal pain. No melena or hematochezia.  GENITOURINARY: No dysuria or hematuria.  ENDOCRINE: No polyuria or nocturia. No heat or cold intolerance.  HEMATOLOGY: No anemia. No bruising. No bleeding.  INTEGUMENTARY: No rashes. No lesions.  MUSCULOSKELETAL: No arthritis. No swelling. No gout.  NEUROLOGIC: No numbness, tingling, or ataxia. No seizure-type activity.  PSYCHIATRIC: No anxiety. No insomnia. No ADD.    Vitals:   Vitals:   12/24/16 1258 12/24/16 1308 12/24/16 1309 12/24/16 1323  BP: (!) 142/55  (!) 141/85 (!) 156/85  Pulse: 74 74 97 67  Resp: (!) 9 (!) 9 13 11   Temp:  97.1 F (36.2 C)    TempSrc:  Axillary    SpO2: 100% 100% 97% 98%  Weight:  161 lb 6 oz (73.2 kg)    Height:        Wt Readings from Last 3 Encounters:   12/24/16 161 lb 6 oz (73.2 kg)  12/08/16 153 lb (69.4 kg)  11/22/16 153 lb (69.4 kg)     Intake/Output Summary (Last 24 hours) at 12/24/16 1404 Last data filed at 12/24/16 1308  Gross per 24 hour  Intake              240 ml  Output             2501 ml  Net            -2261 ml    Physical Exam:   GENERAL: Pleasant-appearing in  no apparent distress.  HEAD, EYES, EARS, NOSE AND THROAT: Atraumatic, normocephalic. Extraocular muscles are intact. Pupils equal and reactive to light. Sclerae anicteric. No conjunctival injection. No oro-pharyngeal erythema.  NECK: Supple. There is no jugular venous distention. No bruits, no lymphadenopathy, no thyromegaly.  HEART: Regular rate and rhythm,. No murmurs, no rubs, no clicks.  LUNGS: Clear to auscultation bilaterally. No rales or rhonchi. No wheezes.  ABDOMEN: Soft, flat, nontender, nondistended. Has good bowel sounds. No hepatosplenomegaly appreciated.  EXTREMITIES: No evidence of any cyanosis, clubbing, or peripheral edema.  +2 pedal and radial pulses bilaterally.  NEUROLOGIC: The patient is alert, awake, and oriented x3 with no focal motor or sensory deficits appreciated bilaterally.  SKIN: Moist and warm with no rashes appreciated.  Psych: Not anxious, depressed LN: No inguinal LN enlargement    Antibiotics   Anti-infectives    None      Medications   Scheduled Meds: . docusate sodium  100 mg Oral BID  . epoetin (EPOGEN/PROCRIT) injection  10,000 Units Intravenous Q T,Th,Sa-HD  . [START ON 12/25/2016] fenofibrate  160 mg Oral QODAY  . fludrocortisone  0.1 mg Oral Daily  . furosemide  40 mg Oral BID  . gabapentin  400 mg Oral QHS  . heparin  5,000 Units Subcutaneous Q8H  . hydrocortisone  10 mg Oral Daily  . hydrocortisone  5 mg Oral QHS  . levothyroxine  100 mcg Oral Q0600  . loratadine  10 mg Oral Daily  . Melatonin  2 tablet Oral QHS  . nicotine  21 mg Transdermal Daily  . nitroGLYCERIN  1 inch Topical Q6H  . nystatin  ointment  1 application Topical BID  . pantoprazole  40 mg Oral Daily  . pravastatin  20 mg Oral q1800  . risperiDONE  2 mg Oral QHS  . risperidone  4 mg Oral Daily  . sertraline  50 mg Oral Daily  . sevelamer carbonate  2,400 mg Oral BID WC  . sodium chloride flush  3 mL Intravenous Q12H  . tamsulosin  0.4 mg Oral Daily  . topiramate  250 mg Oral BID  . traZODone  100 mg Oral QHS  . [START ON 01/05/2017] Vitamin D (Ergocalciferol)  50,000 Units Oral Q30 days   Continuous Infusions: PRN Meds:.acetaminophen **OR** acetaminophen, HYDROmorphone (DILAUDID) injection, ondansetron **OR** ondansetron (ZOFRAN) IV, oxyCODONE   Data Review:   Micro Results Recent Results (from the past 240 hour(s))  MRSA PCR Screening     Status: None   Collection Time: 12/24/16  9:15 AM  Result Value Ref Range Status   MRSA by PCR NEGATIVE NEGATIVE Final    Comment:        The GeneXpert MRSA Assay (FDA approved for NASAL specimens only), is one component of a comprehensive MRSA colonization surveillance program. It is not intended to diagnose MRSA infection nor to guide or monitor treatment for MRSA infections.     Radiology Reports Dg Chest 2 View  Result Date: 12/08/2016 CLINICAL DATA:  Left-sided chest pain EXAM: CHEST  2 VIEW COMPARISON:  November 19, 2016 FINDINGS: Stable cardiomegaly. The hila and mediastinum are normal. No pulmonary nodules, masses, or focal infiltrates. No overt edema. IMPRESSION: Stable cardiomegaly. Electronically Signed   By: Gerome Sam III M.D   On: 12/08/2016 13:25   Dg Chest Portable 1 View  Result Date: 12/24/2016 CLINICAL DATA:  46 year old female with chest pain. EXAM: PORTABLE CHEST 1 VIEW COMPARISON:  Chest radiograph dated 12/08/2016 FINDINGS: There is stable moderate cardiomegaly.  There is silhouetting of the left hemidiaphragm which may be related to an enlarged left ventricle. Left lung base infiltrate is not excluded. Correlation with clinical exam  recommended. PA and lateral views of the chest may provide better evaluation. A small left pleural effusion may be present. The right lung is clear. There is no pneumothorax. No acute osseous pathology. Vascular stents noted in the proximal arms bilaterally. IMPRESSION: 1. Stable cardiomegaly. 2. Silhouetting of the left hemidiaphragm may be related to cardiomegaly or represent developing infiltrate at the left lung base. Clinical correlation is recommended. PA and lateral views of the chest may provide better evaluation. 3. Possible trace left pleural effusion. Electronically Signed   By: Elgie Collard M.D.   On: 12/24/2016 05:35     CBC  Recent Labs Lab 12/24/16 0517  WBC 7.9  HGB 8.9*  HCT 25.7*  PLT 167  MCV 89.8  MCH 31.0  MCHC 34.5  RDW 20.0*    Chemistries   Recent Labs Lab 12/24/16 0517  NA 133*  K 6.2*  CL 103  CO2 19*  GLUCOSE 55*  BUN 96*  CREATININE 7.97*  CALCIUM 7.7*   ------------------------------------------------------------------------------------------------------------------ estimated creatinine clearance is 7.7 mL/min (by C-G formula based on SCr of 7.97 mg/dL (H)). ------------------------------------------------------------------------------------------------------------------ No results for input(s): HGBA1C in the last 72 hours. ------------------------------------------------------------------------------------------------------------------ No results for input(s): CHOL, HDL, LDLCALC, TRIG, CHOLHDL, LDLDIRECT in the last 72 hours. ------------------------------------------------------------------------------------------------------------------  Recent Labs  12/24/16 0517  TSH 0.067*   ------------------------------------------------------------------------------------------------------------------ No results for input(s): VITAMINB12, FOLATE, FERRITIN, TIBC, IRON, RETICCTPCT in the last 72 hours.  Coagulation profile No results for  input(s): INR, PROTIME in the last 168 hours.  No results for input(s): DDIMER in the last 72 hours.  Cardiac Enzymes  Recent Labs Lab 12/24/16 0517  TROPONINI 0.06*   ------------------------------------------------------------------------------------------------------------------ Invalid input(s): POCBNP    Assessment & Plan   This is a 46 year old female admitted for hyperkalemia. 1. Hyperkalemia:pt will receive HD.  2. Chest pain: Multifactorial as the patient has chronic pain syndrome, recurrent chest pain, try pain meds 3. End-stage renal disease: Continue dialysis. Continue hd 4. Hypothyroidism:   continue Synthroid. 5. Seizure disorder: Continue Topamax 6. Dyslipidemia: Continue fenofibrate and statin therapy 7. History of hypotension: Continue hydrocortisone as well as Florinef 8. Depression: Continue Zoloft and Risperdal. Trazodone for sleep 9. DVT prophylaxis: Heparin 10. GI prophylaxis: PPI per home regimen      Code Status Orders        Start     Ordered   12/24/16 0754  Full code  Continuous     12/24/16 0753    Code Status History    Date Active Date Inactive Code Status Order ID Comments User Context   12/08/2016  7:58 PM 12/09/2016  5:36 PM Full Code [id number]  Altamese Dilling, MD ED   11/19/2016  7:38 PM 11/24/2016  3:32 PM Full Code [id number]  Katharina Caper, MD Inpatient   11/08/2016  1:41 PM 11/14/2016  4:56 PM Full Code [id number]  Simonne Martinet, NP Inpatient   10/26/2016  9:44 PM 10/29/2016  9:58 PM Full Code [id number]  Tonye Royalty, DO Inpatient   10/02/2016  5:09 AM 10/04/2016  7:05 PM Full Code [id number]  Arnaldo Natal, MD Inpatient   06/27/2016  8:27 PM 06/30/2016  5:15 PM Full Code [id number]  Delano Metz, MD Inpatient   03/04/2016  6:04 AM 03/07/2016  6:24 PM Full Code [id number]  Oralia Manis, MD Inpatient   02/25/2016  3:36  PM 03/01/2016  3:06 PM Full Code [id number]  Enedina Finner, MD Inpatient           Consults  none  DVT  Prophylaxis  Lovenox   Lab Results  Component Value Date   PLT 167 12/24/2016     Time Spent in minutes    Greater than 50% of time spent in care coordination and counseling patient regarding the condition and plan of care.   [name] M.D on 12/24/2016 at 2:04 PM  Between 7am to 6pm - Pager - [phone]  After 6pm go to [email or web address] - password [email or web address] [facility]  [facility]   Office  [phone]

## 2016-12-24 NOTE — Progress Notes (Signed)
Wasted 1 mg of PRN dose of dilaudid with Doristine Section, RN. Charline Bills, RN was present while RN was wasting IV dilaudid.   [employer]

## 2016-12-24 NOTE — ED Notes (Signed)
2 unsuccessful [place] IV attempts

## 2016-12-24 NOTE — Progress Notes (Signed)
Pre HD assessment  

## 2016-12-24 NOTE — Progress Notes (Signed)
Post HD assessment unchanged  

## 2016-12-24 NOTE — Progress Notes (Signed)
HD initiated without issue  

## 2016-12-24 NOTE — Progress Notes (Addendum)
Patient medicated for pain in hip and back. Dose ordered was 2mg  dilaudid IV. While giving first 1mg  dose slow iv push patient fell asleep. RR currently 8-9. Did not give 2nd mg as ordered d/t patient sleeping and low respirations. 02 sats 97-100% on RA. BP stable. Notified primary RN. Continue to monitor patient respiratory status closely. Will waste dose in pyxis with primary RN as soon as able.

## 2016-12-24 NOTE — Progress Notes (Signed)
Patient asleep.

## 2016-12-24 NOTE — Progress Notes (Signed)
Pt arrived to [facility] room 203. Bed alarm was placed, VSS, pt alert X4. Pt has medications at bedside and agrees to let RN lock medication in pharmacy, pt does have an empty oxycodone bottle at bedside. Pt also has two packs of cigarettes in her bag at bedside and refuses RN to take them to get locked up in the locker with her coin purse. Security was called and twenty-one dollars, debit card, ID, and personal papers were locked up.   Hildreth Orsak [employer]

## 2016-12-24 NOTE — Progress Notes (Addendum)
HD completed without issue. UF 2.4L. Patient tolerated well. Post HD vitals stable. RR 8-12, HR 79, 02 sats 99% on RA. Patient received epogen with treatment as ordered as well as 1mg  of dilaudid IV for pain.

## 2016-12-24 NOTE — Progress Notes (Signed)
Hemodialysis- Patient calling out in pain. Stating 10/10 hip pain and chest pain. Offered nitroglycerin for chest pain and patient refusing. Notified primary RN. Pt fell asleep for approx 5 minutes then awoke and began to call out again. Continues to refuse nitroglycerin.

## 2016-12-25 LAB — RENAL FUNCTION PANEL
Albumin: 3.3 g/dL — ABNORMAL LOW (ref 3.5–5.0)
Anion gap: 9 (ref 5–15)
BUN: 37 mg/dL — ABNORMAL HIGH (ref 6–20)
CHLORIDE: 99 mmol/L — AB (ref 101–111)
CO2: 28 mmol/L (ref 22–32)
Calcium: 7.8 mg/dL — ABNORMAL LOW (ref 8.9–10.3)
Creatinine, Ser: 4.3 mg/dL — ABNORMAL HIGH (ref 0.44–1.00)
GFR calc Af Amer: 14 mL/min — ABNORMAL LOW (ref >60)
GFR calc non Af Amer: 12 mL/min — ABNORMAL LOW (ref >60)
GLUCOSE: 77 mg/dL (ref 65–99)
POTASSIUM: 4.7 mmol/L (ref 3.5–5.1)
Phosphorus: 4.8 mg/dL — ABNORMAL HIGH (ref 2.5–4.6)
SODIUM: 136 mmol/L (ref 135–145)

## 2016-12-25 LAB — GLUCOSE, CAPILLARY
Glucose-Capillary: 102 mg/dL — ABNORMAL HIGH (ref 65–99)
Glucose-Capillary: 88 mg/dL (ref 65–99)

## 2016-12-25 MED ORDER — OXYCODONE HCL 5 MG PO TABS: 5.0000 mg | ORAL_TABLET | Freq: Four times a day (QID) | ORAL | 0 refills | Status: AC | PRN

## 2016-12-25 NOTE — Progress Notes (Signed)
Patient given discharged instructions as ordered. Patient to continue on home medications and prescription given to patient as ordered. Patient is alert and oriented, no acute distress noted. Patient called for a cab, IV to right shoulder discontinued site clean dry and intact.

## 2016-12-25 NOTE — Progress Notes (Signed)
Patient is requesting to be taken outside in a wheelchair to wait for her cab. It was explained to her that staff can not leave her outside alone to wait.  Patient became irritated and replied "that's bullshit! And I'm going to complain about that too." It was reiterated that for safety purposes, staff can not leave her outside to wait. Patient is also visually impaired.

## 2016-12-25 NOTE — Care Management Note (Addendum)
Case Management Note  Patient Details  Name: RAMONIA ZULEGER MRN: [id number] Date of Birth: [birth year]  Subjective/Objective:     Discussed discharge planning with Ms Lindell Noe who verbalized no home health needs. Has transportation home today. She is on a T-T-S schedule for hemodialysis and agreed to call her regular dialysis provider [facility] in [place] tomorrow to get a time for her dialysis this coming Tuesday.                Action/Plan:   Expected Discharge Date:  12/25/16               Expected Discharge Plan:     In-House Referral:     Discharge planning Services     Post Acute Care Choice:    Choice offered to:     DME Arranged:    DME Agency:     HH Arranged:    HH Agency:     Status of Service:     If discussed at [employer], dates discussed:    Additional Comments:  Feleshia Zundel A, RN 12/25/2016, 11:10 AM

## 2016-12-25 NOTE — Discharge Summary (Signed)
[facility] -  at [facility]  Erin [name], 46 y.o., DOB 03/09/1971, MRN [id number]. Admission date: 12/24/2016 Discharge Date 12/25/2016 Primary MD [facility] PRIMARY CARE Admitting Physician Arnaldo Natal, MD  Admission Diagnosis  Hyperkalemia [E87.5] Nonspecific chest pain [R07.9]  Discharge Diagnosis   Active Problems: Hyperkalemia Chest pain noncardiac narcotic seeking behavior End-stage renal disease Medical noncompliance Anxiety Anemia of chronic disease Asthma Bipolar affective depression Chronic back pain Chronic headache   GERD Hypothyroidism      Hospital Course The patient well-known to the hospitalist service with past medical history significant for end-stage renal disease on dialysis, blindness, hypothyroidism, presented after she had not gone to her dialysis for the past few dialysis. Patient also has chronic chest pain has had a cardiac catheterization in December. She has had 7 admissions within the past 6 months for various complaints. She was admitted for hyperkalemia. She underwent dialysis she tolerated dialysis well. She continued to complain of sharp chest pain risk requesting only Dilaudid to relieve her pain. At this point her cardiac enzymes and her symptoms are inconsistent with cardiac chest pain she is stable for discharge to home.            Consults  nephrology  Significant Tests:  See full reports for all details     Dg Chest 2 View  Result Date: 12/08/2016 CLINICAL DATA:  Left-sided chest pain EXAM: CHEST  2 VIEW COMPARISON:  November 19, 2016 FINDINGS: Stable cardiomegaly. The hila and mediastinum are normal. No pulmonary nodules, masses, or focal infiltrates. No overt edema. IMPRESSION: Stable cardiomegaly. Electronically Signed   By: Gerome Sam III M.D   On: 12/08/2016 13:25   Dg Chest Portable 1 View  Result Date: 12/24/2016 CLINICAL DATA:  46 year old female with chest pain. EXAM: PORTABLE CHEST 1  VIEW COMPARISON:  Chest radiograph dated 12/08/2016 FINDINGS: There is stable moderate cardiomegaly. There is silhouetting of the left hemidiaphragm which may be related to an enlarged left ventricle. Left lung base infiltrate is not excluded. Correlation with clinical exam recommended. PA and lateral views of the chest may provide better evaluation. A small left pleural effusion may be present. The right lung is clear. There is no pneumothorax. No acute osseous pathology. Vascular stents noted in the proximal arms bilaterally. IMPRESSION: 1. Stable cardiomegaly. 2. Silhouetting of the left hemidiaphragm may be related to cardiomegaly or represent developing infiltrate at the left lung base. Clinical correlation is recommended. PA and lateral views of the chest may provide better evaluation. 3. Possible trace left pleural effusion. Electronically Signed   By: Elgie Collard M.D.   On: 12/24/2016 05:35       Today   Subjective:   Erin [name]   Continued Atypical chest pain Blood pressure 132/76, pulse 77, temperature 98.1 F (36.7 C), temperature source Oral, resp. rate 17, height 4\' 10"  (1.473 m), weight 161 lb 6 oz (73.2 kg), last menstrual period 11/21/2007, SpO2 98 %.  .  Intake/Output Summary (Last 24 hours) at 12/25/16 1251 Last data filed at 12/25/16 1044  Gross per 24 hour  Intake              596 ml  Output             2701 ml  Net            -2105 ml    Exam VITAL SIGNS: Blood pressure 132/76, pulse 77, temperature 98.1 F (36.7 C), temperature source Oral, resp. rate 17, height  4\' 10"  (1.473 m), weight 161 lb 6 oz (73.2 kg), last menstrual period 11/21/2007, SpO2 98 %.  GENERAL:  46 y.o.-year-old patient lying in the bed with no acute distress.  EYES: Pupils equal, round, reactive to light and accommodation. No scleral icterus. Extraocular muscles intact.  HEENT: Head atraumatic, normocephalic. Oropharynx and nasopharynx clear.  NECK:  Supple, no jugular venous  distention. No thyroid enlargement, no tenderness.  LUNGS: Normal breath sounds bilaterally, no wheezing, rales,rhonchi or crepitation. No use of accessory muscles of respiration.  CARDIOVASCULAR: S1, S2 normal. No murmurs, rubs, or gallops.  ABDOMEN: Soft, nontender, nondistended. Bowel sounds present. No organomegaly or mass.  EXTREMITIES: No pedal edema, cyanosis, or clubbing.  NEUROLOGIC: Cranial nerves II through XII are intact. Muscle strength 5/5 in all extremities. Sensation intact. Gait not checked.  PSYCHIATRIC: The patient is alert and oriented x 3.  SKIN: No obvious rash, lesion, or ulcer.   Data Review     CBC w Diff: Lab Results  Component Value Date   WBC 7.9 12/24/2016   HGB 8.9 (L) 12/24/2016   HGB 12.3 11/25/2014   HCT 25.7 (L) 12/24/2016   HCT 37.5 11/25/2014   PLT 167 12/24/2016   PLT 230 11/25/2014   LYMPHOPCT 25 11/19/2016   LYMPHOPCT 18.7 11/15/2013   BANDSPCT 0 07/04/2016   MONOPCT 6 11/19/2016   MONOPCT 5.8 11/15/2013   EOSPCT 5 11/19/2016   EOSPCT 0.7 11/15/2013   BASOPCT 1 11/19/2016   BASOPCT 1.5 11/15/2013   CMP: Lab Results  Component Value Date   NA 136 12/25/2016   NA 140 11/25/2014   K 4.7 12/25/2016   K 3.7 11/25/2014   CL 99 (L) 12/25/2016   CL 100 11/25/2014   CO2 28 12/25/2016   CO2 25 11/25/2014   BUN 37 (H) 12/25/2016   BUN 56 (H) 11/25/2014   CREATININE 4.30 (H) 12/25/2016   CREATININE 6.17 (H) 11/25/2014   PROT 5.6 (L) 11/08/2016   PROT 6.6 11/25/2014   ALBUMIN 3.3 (L) 12/25/2016   ALBUMIN 3.3 (L) 11/25/2014   BILITOT 0.5 11/08/2016   BILITOT 0.4 11/25/2014   ALKPHOS 125 11/08/2016   ALKPHOS 69 11/25/2014   AST 27 11/08/2016   AST 103 (H) 11/25/2014   ALT <5 (L) 11/08/2016   ALT 79 (H) 11/25/2014  .  Micro Results Recent Results (from the past 240 hour(s))  MRSA PCR Screening     Status: None   Collection Time: 12/24/16  9:15 AM  Result Value Ref Range Status   MRSA by PCR NEGATIVE NEGATIVE Final    Comment:         The GeneXpert MRSA Assay (FDA approved for NASAL specimens only), is one component of a comprehensive MRSA colonization surveillance program. It is not intended to diagnose MRSA infection nor to guide or monitor treatment for MRSA infections.         Code Status Orders        Start     Ordered   12/24/16 0754  Full code  Continuous     12/24/16 0753    Code Status History    Date Active Date Inactive Code Status Order ID Comments User Context   12/08/2016  7:58 PM 12/09/2016  5:36 PM Full Code [id number]  Altamese Dilling, MD ED   11/19/2016  7:38 PM 11/24/2016  3:32 PM Full Code [id number]  Katharina Caper, MD Inpatient   11/08/2016  1:41 PM 11/14/2016  4:56 PM Full Code [id number]  Antionette Poles  Tanja Port, NP Inpatient   10/26/2016  9:44 PM 10/29/2016  9:58 PM Full Code [id number]  Tonye Royalty, DO Inpatient   10/02/2016  5:09 AM 10/04/2016  7:05 PM Full Code [id number]  Arnaldo Natal, MD Inpatient   06/27/2016  8:27 PM 06/30/2016  5:15 PM Full Code [id number]  Delano Metz, MD Inpatient   03/04/2016  6:04 AM 03/07/2016  6:24 PM Full Code [id number]  Oralia Manis, MD Inpatient   02/25/2016  3:36 PM 03/01/2016  3:06 PM Full Code [id number]  Enedina Finner, MD Inpatient          Follow-up Information    [facility] PRIMARY [facility]ARE Follow up in 5 day(s).   Specialty:  Family Medicine Contact information: [address] [place] [address] [phone]        [facility].   Why:  go to [facility] as shceduled          Discharge Medications   Allergies as of 12/25/2016      Reactions   Dhea [nutritional Supplements] Anaphylaxis   Patient states medication is DHE for migraines, not DHEA   Demerol [meperidine] Other (See Comments)   Reaction:  Hallucinations    Floxin [ofloxacin] Other (See Comments)   Reaction:  Hallucinations    Nsaids Nausea And Vomiting, Swelling   Nubain [nalbuphine Hcl] Other (See Comments)   Reaction:  Hallucinations    Phenergan [promethazine Hcl] Other  (See Comments)   Reaction:  Restless legs    Stadol [butorphanol] Other (See Comments)   Reaction:  Hallucinations    Erythromycin Diarrhea, Rash      Medication List    TAKE these medications   fenofibrate 145 MG tablet Commonly known as:  TRICOR Take 145 mg by mouth every other day.   fludrocortisone 0.1 MG tablet Commonly known as:  FLORINEF Take 0.1 mg by mouth daily.   furosemide 40 MG tablet Commonly known as:  LASIX Take 40 mg by mouth 2 (two) times daily. Notes to patient:  Last dose was given on Sunday 12/25/16 at 8:34 am.   gabapentin 100 MG capsule Commonly known as:  NEURONTIN Take 400 mg by mouth at bedtime.   hydrocortisone 10 MG tablet Commonly known as:  CORTEF Take 0.5-1 tablets (5-10 mg total) by mouth 2 (two) times daily. Pt takes 10 mg in the morning and 5 mg at bedtime.   levothyroxine 100 MCG tablet Commonly known as:  SYNTHROID, LEVOTHROID Take 100 mcg by mouth daily. Notes to patient:  Last dose was given on Sunday 12/25/16 at 8:07 am.   loratadine 10 MG tablet Commonly known as:  CLARITIN Take 10 mg by mouth daily.   lovastatin 20 MG tablet Commonly known as:  MEVACOR Take 20 mg by mouth every evening.   Melatonin 5 MG Tabs Take 2 tablets by mouth at bedtime.   nystatin ointment Commonly known as:  MYCOSTATIN Apply 1 application topically 2 (two) times daily. Apply to affected area (groin, abdominal fold).   ondansetron 4 MG tablet Commonly known as:  ZOFRAN Take 1 tablet (4 mg total) by mouth every 8 (eight) hours as needed for nausea or vomiting.   oxyCODONE 5 MG immediate release tablet Commonly known as:  Oxy IR/ROXICODONE Take 1 tablet (5 mg total) by mouth every 6 (six) hours as needed for severe pain. Notes to patient:  Last dose was administered on Sunday 12/25/16 at 7:56 am.   pantoprazole 40 MG tablet Commonly known as:  PROTONIX Take 1  tablet (40 mg total) by mouth daily.   risperidone 4 MG tablet Commonly known as:   RISPERDAL Take 2-4 mg by mouth 2 (two) times daily. Pt takes 4 mg in the morning and 2 mg at bedtime.   sertraline 50 MG tablet Commonly known as:  ZOLOFT Take 50 mg by mouth daily.   sevelamer carbonate 800 MG tablet Commonly known as:  RENVELA Take 2,400 mg by mouth 2 (two) times daily. Notes to patient:  Last dose was given on Sunday 12/25/16 at 8:34 am.   tamsulosin 0.4 MG Caps capsule Commonly known as:  FLOMAX Take 1 capsule (0.4 mg total) by mouth daily.   topiramate 200 MG tablet Commonly known as:  TOPAMAX Take 250 mg by mouth 2 (two) times daily. Pt takes with a 50 mg tablet.   traZODone 100 MG tablet Commonly known as:  DESYREL Take 100 mg by mouth at bedtime.   Vitamin D (Ergocalciferol) 50000 units Caps capsule Commonly known as:  DRISDOL Take 50,000 Units by mouth every 30 (thirty) days. Takes on the 8th of every month          Total Time in preparing paper work, data evaluation and todays exam - 35 minutes  [name] M.D on 12/25/2016 at 12:51 PM  [facility]   Office  [phone]

## 2016-12-25 NOTE — Clinical Social Work Note (Signed)
CSW contacted Christiane Ha from [facility] to confirm that the patient was dc'd from their facility. He confirmed that she was dc'd on Friday. CSW updated attending. CSW signing off.  Argentina Ponder, MSW, [facility] [phone]

## 2016-12-25 NOTE — Discharge Instructions (Signed)
[facility] ° °DIET:  °Renal diet ° °DISCHARGE CONDITION:  °Stable ° °ACTIVITY:  °Activity as tolerated ° °OXYGEN:  °Home Oxygen: No. °  °Oxygen Delivery: room air ° °DISCHARGE LOCATION:  °home  ° ° °ADDITIONAL DISCHARGE INSTRUCTION: ° ° °If you experience worsening of your admission symptoms, develop shortness of breath, life threatening emergency, suicidal or homicidal thoughts you must seek medical attention immediately by calling 911 or calling your MD immediately  if symptoms less severe. ° °You Must read complete instructions/literature along with all the possible adverse reactions/side effects for all the Medicines you take and that have been prescribed to you. Take any new Medicines after you have completely understood and accpet all the possible adverse reactions/side effects.  ° °Please note ° °You were cared for by a hospitalist during your hospital stay. If you have any questions about your discharge medications or the care you received while you were in the hospital after you are discharged, you can call the unit and asked to speak with the hospitalist on call if the hospitalist that took care of you is not available. Once you are discharged, your primary care physician will handle any further medical issues. Please note that NO REFILLS for any discharge medications will be authorized once you are discharged, as it is imperative that you return to your primary care physician (or establish a relationship with a primary care physician if you do not have one) for your aftercare needs so that they can reassess your need for medications and monitor your lab values. ° ° °

## 2016-12-26 LAB — ACID FAST CULTURE WITH REFLEXED SENSITIVITIES (MYCOBACTERIA): Acid Fast Culture: NEGATIVE

## 2016-12-26 LAB — ACID FAST CULTURE WITH REFLEXED SENSITIVITIES

## 2016-12-28 ENCOUNTER — Encounter: Payer: Self-pay | Admitting: *Deleted

## 2016-12-28 ENCOUNTER — Emergency Department
Admission: EM | Admit: 2016-12-28 | Discharge: 2016-12-28 | Disposition: A | Discharge status: Home / Self Care | Payer: Medicare Other | Attending: Student in an Organized Health Care Education/Training Program | Admitting: Student in an Organized Health Care Education/Training Program

## 2016-12-28 DIAGNOSIS — Z992 Dependence on renal dialysis: Secondary | ICD-10-CM | POA: Diagnosis not present

## 2016-12-28 DIAGNOSIS — Z79899 Other long term (current) drug therapy: Secondary | ICD-10-CM | POA: Diagnosis not present

## 2016-12-28 DIAGNOSIS — J45909 Unspecified asthma, uncomplicated: Secondary | ICD-10-CM | POA: Diagnosis not present

## 2016-12-28 DIAGNOSIS — N186 End stage renal disease: Secondary | ICD-10-CM | POA: Insufficient documentation

## 2016-12-28 DIAGNOSIS — F17210 Nicotine dependence, cigarettes, uncomplicated: Secondary | ICD-10-CM | POA: Diagnosis not present

## 2016-12-28 DIAGNOSIS — E162 Hypoglycemia, unspecified: Secondary | ICD-10-CM

## 2016-12-28 DIAGNOSIS — E039 Hypothyroidism, unspecified: Secondary | ICD-10-CM | POA: Diagnosis not present

## 2016-12-28 DIAGNOSIS — R079 Chest pain, unspecified: Secondary | ICD-10-CM

## 2016-12-28 LAB — BASIC METABOLIC PANEL
ANION GAP: 10 (ref 5–15)
BUN: 58 mg/dL — ABNORMAL HIGH (ref 6–20)
CHLORIDE: 99 mmol/L — AB (ref 101–111)
CO2: 22 mmol/L (ref 22–32)
Calcium: 8.1 mg/dL — ABNORMAL LOW (ref 8.9–10.3)
Creatinine, Ser: 6.93 mg/dL — ABNORMAL HIGH (ref 0.44–1.00)
GFR calc Af Amer: 8 mL/min — ABNORMAL LOW (ref >60)
GFR, EST NON AFRICAN AMERICAN: 7 mL/min — AB (ref >60)
GLUCOSE: 67 mg/dL (ref 65–99)
POTASSIUM: 5 mmol/L (ref 3.5–5.1)
Sodium: 131 mmol/L — ABNORMAL LOW (ref 135–145)

## 2016-12-28 LAB — GLUCOSE, CAPILLARY
GLUCOSE-CAPILLARY: 114 mg/dL — AB (ref 65–99)
GLUCOSE-CAPILLARY: 104 mg/dL — AB (ref 65–99)
Glucose-Capillary: 60 mg/dL — ABNORMAL LOW (ref 65–99)
Glucose-Capillary: 61 mg/dL — ABNORMAL LOW (ref 65–99)

## 2016-12-28 LAB — TROPONIN I: Troponin I: 0.03 ng/mL (ref <0.03)

## 2016-12-28 LAB — CBC
HEMATOCRIT: 27.3 % — AB (ref 35.0–47.0)
HEMOGLOBIN: 8.9 g/dL — AB (ref 12.0–16.0)
MCH: 29.4 pg (ref 26.0–34.0)
MCHC: 32.7 g/dL (ref 32.0–36.0)
MCV: 90.1 fL (ref 80.0–100.0)
PLATELETS: 231 10*3/uL (ref 150–440)
RBC: 3.03 MIL/uL — AB (ref 3.80–5.20)
RDW: 20.7 % — ABNORMAL HIGH (ref 11.5–14.5)
WBC: 7 10*3/uL (ref 3.6–11.0)

## 2016-12-28 MED ORDER — DEXTROSE 50 % IV SOLN
1.0000 | Freq: Once | INTRAVENOUS | Status: AC
  Administered 2016-12-28: 50 mL via INTRAVENOUS
  Filled 2016-12-28: qty 50

## 2016-12-28 MED ORDER — ONDANSETRON HCL 4 MG/2ML IJ SOLN
4.0000 mg | Freq: Once | INTRAMUSCULAR | Status: AC
  Administered 2016-12-28: 4 mg via INTRAVENOUS

## 2016-12-28 MED ORDER — ONDANSETRON HCL 4 MG/2ML IJ SOLN
INTRAMUSCULAR | Status: AC
Start: 2016-12-28 — End: 2016-12-28
  Administered 2016-12-28: 4 mg via INTRAVENOUS
  Filled 2016-12-28: qty 2

## 2016-12-28 MED ORDER — PROMETHAZINE HCL 25 MG/ML IJ SOLN
INTRAMUSCULAR | Status: AC
  Filled 2016-12-28: qty 1

## 2016-12-28 MED ORDER — PROMETHAZINE HCL 25 MG/ML IJ SOLN: 25.0000 mg | Freq: Once | INTRAMUSCULAR | Status: DC

## 2016-12-28 MED ORDER — TRAMADOL HCL 50 MG PO TABS
100.0000 mg | ORAL_TABLET | Freq: Once | ORAL | Status: AC
  Administered 2016-12-28: 100 mg via ORAL

## 2016-12-28 MED ORDER — TRAMADOL HCL 50 MG PO TABS
ORAL_TABLET | ORAL | Status: AC
  Administered 2016-12-28: 100 mg via ORAL
  Filled 2016-12-28: qty 2

## 2016-12-28 NOTE — ED Notes (Signed)
MD at bedside to communicate results. pT verbalized understanding.

## 2016-12-28 NOTE — ED Notes (Signed)
Pt up to the bathroom without difficulty. Pt grunting when trying to stand but was stable and independent when ambulating. Blankets given to pt.

## 2016-12-28 NOTE — ED Notes (Addendum)
Pt given a [place] sandwich and coke per MD verbal order to have pt eat. Pt having no trouble eating by self.

## 2016-12-28 NOTE — ED Notes (Signed)
Pt continues to report CP at this time. Rn asked if pt would want to see if MD would give tylenol or pain medication other than dilaudid and pt refused.

## 2016-12-28 NOTE — ED Notes (Signed)
PT given orange juice for low CBG. Pt refused food at this time.

## 2016-12-28 NOTE — ED Triage Notes (Signed)
Pt arrived to ED via EMS after being called to pt house for hypoglycemia. Per EMS pts Glucose was 66 pt was alert and oriented x 4. Pt also verbalized left sided CP that radiates into left arm. Pain increased upon palpation. No cough reported at this time. Pt reports SOB and lightheadedness beginning today. Pt remains alert and oriented upon arrival to ED.   Pt refused ASA with EMS.

## 2016-12-28 NOTE — ED Notes (Signed)
Pt reports having nausea and pain at this time. Pt requesting Dilaudid by name and reports, "I usually get 1 mg of dilaudid when I am here."   Pt informed RN would ask the MD.

## 2016-12-28 NOTE — ED Provider Notes (Signed)
[facility] Emergency Department Provider Note    First MD Initiated Contact with Patient 12/28/16 1743     (approximate)  I have reviewed the triage vital signs and the nursing notes.   HISTORY  Chief Complaint Chest Pain and Hypoglycemia    HPI Erin [name] is a 46 y.o. female monitor this facility with multiple chronic comorbidities as well as end-stage renal disease on dialysis Tuesday Thursday and Saturday presents to the ER for recurrent chest pain as well as concern for low blood sugar.  Patient states that the chest pain has been persistent and unchanged since her last admission to hospital for the same. States she's also been having crampy diffuse abdominal pain and nausea which is also chronic for her. States the EMS arrived check her blood sugar and it was 60. She refused any oral supplementation. Patient denies any diaphoresis. No shortness of breath with this. No worsening orthopnea.  Patient requesting IV Dilaudid for her chest pain.   Past Medical History:  Diagnosis Date  . Adrenal insufficiency (HCC)   . Anemia   . Anxiety   . Arthritis    "ankles, knees, hips" (11/11/2016)  . Asthma   . Bipolar affect, depressed (HCC)   . Blind    "since age 68 months"  . Chronic bronchitis (HCC)   . Chronic headaches    Seeing Pain Management  . Chronic upper back pain   . Constipation   . Depression   . Epilepsy (HCC)    "no seizure in a long time" (11/11/2016)  . ESRD on dialysis [facility])    "TTS; Cheree Ditto" (11/11/2016)  . Fibromyalgia   . GERD (gastroesophageal reflux disease)   . Heart murmur   . History of benign pituitary tumor   . History of blood transfusion    "bleeding internally; never found out from where"  . History of DVT (deep vein thrombosis)    RLE  . History of hiatal hernia   . History of kidney stones   . Hyperlipidemia   . Hypothyroid   . Myocardial infarction 09/2016  . Narcolepsy   . 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 Pericardial effusion    jan 2018- [place]  . Pericardial effusion with cardiac tamponade    Hattie Perch 11/10/2016  . Pneumonia X 2  . Renal insufficiency   . Stroke [facility]) ~ 2002   "right side weaker since" (11/11/2016)   Family History  Problem Relation Age of Onset  . Heart attack Mother   . Migraines Mother   . Aneurysm Mother   . Diabetes Father    Past Surgical History:  Procedure Laterality Date  . BRAIN SURGERY  1976  . BREAST LUMPECTOMY Left 2001  . CARDIAC CATHETERIZATION Right 10/03/2016   Procedure: Coronary Angiography;  Surgeon: Laurier Nancy, MD;  Location: [facility] INVASIVE CV LAB;  Service: Cardiovascular;  Laterality: Right;  . CARDIAC CATHETERIZATION N/A 11/08/2016   Procedure: Pericardiocentesis;  Surgeon: Tonny Bollman, MD;  Location: [facility] INVASIVE CV LAB;  Service: Cardiovascular;  Laterality: N/A;  . DG AV DIALYSIS GRAFT DECLOT OR     X 4  . GIVENS CAPSULE STUDY N/A 02/29/2016   Procedure: GIVENS CAPSULE STUDY;  Surgeon: Elnita Maxwell, MD;  Location: [facility] ENDOSCOPY;  Service: Endoscopy;  Laterality: N/A;  . LAPAROSCOPIC CHOLECYSTECTOMY  2001  . PERICARDIOCENTESIS  11/08/2016   Pericardial effusion with tamponade Hattie Perch 11/10/2016  . PERIPHERAL VASCULAR CATHETERIZATION N/A 08/21/2015   Procedure:  Dialysis/Perma Catheter Insertion;  Surgeon: Renford Dills, MD;  Location: [facility] INVASIVE CV LAB;  Service: Cardiovascular;  Laterality: N/A;  . PERIPHERAL VASCULAR CATHETERIZATION N/A 02/29/2016   Procedure: Dialysis/Perma Catheter Removal;  Surgeon: Annice Needy, MD;  Location: [facility] INVASIVE CV LAB;  Service: Cardiovascular;  Laterality: N/A;  . PERIPHERAL VASCULAR CATHETERIZATION N/A 10/28/2016   Procedure: A/V Fistulagram;  Surgeon: Renford Dills, MD;  Location: [facility] INVASIVE CV LAB;  Service: Cardiovascular;  Laterality: N/A;  . PERIPHERAL VASCULAR CATHETERIZATION N/A 10/28/2016   Procedure: A/V Shunt  Intervention;  Surgeon: Renford Dills, MD;  Location: [facility] INVASIVE CV LAB;  Service: Cardiovascular;  Laterality: N/A;  . PITUITARY EXCISION  1976   Patient Active Problem List   Diagnosis Date Noted  . Hyperkalemia 11/19/2016  . Elevated troponin 11/19/2016  . ESRD (end stage renal disease) (HCC) 11/19/2016  . Shock circulatory (HCC) 11/08/2016  . Cardiac/pericardial tamponade   . Chest pain 10/02/2016  . Palliative care encounter   . Goals of care, counseling/discussion   . Seizure disorder (HCC) 06/27/2016  . Blindness 06/27/2016  . Chronic pain 06/27/2016  . History of CVA (cerebrovascular accident) 06/27/2016  . Hypothyroidism 03/04/2016  . GERD (gastroesophageal reflux disease) 03/04/2016  . End stage renal disease on dialysis (HCC) 03/04/2016  . Bipolar 1 disorder, mixed, moderate (HCC) 02/26/2016  . 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 Anemia 02/25/2016      Prior to Admission medications   Medication Sig Start Date End Date Taking? Authorizing Provider  fenofibrate (TRICOR) 145 MG tablet Take 145 mg by mouth every other day.   Yes Historical Provider, MD  fludrocortisone (FLORINEF) 0.1 MG tablet Take 0.1 mg by mouth daily.   Yes Historical Provider, MD  furosemide (LASIX) 40 MG tablet Take 40 mg by mouth 2 (two) times daily.   Yes Historical Provider, MD  gabapentin (NEURONTIN) 100 MG capsule Take 400 mg by mouth at bedtime.   Yes Historical Provider, MD  hydrocortisone (CORTEF) 10 MG tablet Take 0.5-1 tablets (5-10 mg total) by mouth 2 (two) times daily. Pt takes 10 mg in the morning and 5 mg at bedtime. 11/14/16  Yes Janetta Hora, PA-C  levothyroxine (SYNTHROID, LEVOTHROID) 100 MCG tablet Take 100 mcg by mouth daily.    Yes Historical Provider, MD  loratadine (CLARITIN) 10 MG tablet Take 10 mg by mouth daily.   Yes Historical Provider, MD  lovastatin (MEVACOR) 20 MG tablet Take 20 mg by mouth  every evening.    Yes Historical Provider, MD  Melatonin 5 MG TABS Take 2 tablets by mouth at bedtime.   Yes Historical Provider, MD  nystatin ointment (MYCOSTATIN) Apply 1 application topically 2 (two) times daily. Apply to affected area (groin, abdominal fold). 06/30/16  Yes Standley Brooking, MD  ondansetron (ZOFRAN) 4 MG tablet Take 1 tablet (4 mg total) by mouth every 8 (eight) hours as needed for nausea or vomiting. 10/15/16  Yes Nita Sickle, MD  oxyCODONE (OXY IR/ROXICODONE) 5 MG immediate release tablet Take 1 tablet (5 mg total) by mouth every 6 (six) hours as needed for severe pain. 12/25/16  Yes [name], MD  pantoprazole (PROTONIX) 40 MG tablet Take 1 tablet (40 mg total) by mouth daily. 02/27/16  Yes [name], MD  risperidone (RISPERDAL) 4 MG tablet Take 2-4 mg by mouth 2 (two) times daily. Pt takes 4 mg in the morning and 2 mg at bedtime.   Yes  Historical Provider, MD  sertraline (ZOLOFT) 50 MG tablet Take 50 mg by mouth daily.   Yes Historical Provider, MD  sevelamer carbonate (RENVELA) 800 MG tablet Take 2,400 mg by mouth 2 (two) times daily.    Yes Historical Provider, MD  tamsulosin (FLOMAX) 0.4 MG CAPS capsule Take 1 capsule (0.4 mg total) by mouth daily. 11/23/16  Yes Srikar Sudini, MD  topiramate (TOPAMAX) 200 MG tablet Take 250 mg by mouth 2 (two) times daily. Pt takes with a 50 mg tablet.   Yes Historical Provider, MD  traZODone (DESYREL) 100 MG tablet Take 100 mg by mouth at bedtime.   Yes Historical Provider, MD  Vitamin D, Ergocalciferol, (DRISDOL) 50000 UNITS CAPS capsule Take 50,000 Units by mouth every 30 (thirty) days. Takes on the 8th of every month   Yes Historical Provider, MD    Allergies Dhea [nutritional supplements]; Demerol [meperidine]; Floxin [ofloxacin]; Nsaids; Nubain [nalbuphine hcl]; Phenergan [promethazine hcl]; Stadol [butorphanol]; and Erythromycin    Social History Social History  Substance Use Topics  . Smoking status: Current Every  Day Smoker    Packs/day: 0.50    Years: 18.00    Types: Cigarettes  . Smokeless tobacco: Never Used  . Alcohol use No    Review of Systems Patient denies headaches, rhinorrhea, blurry vision, numbness, shortness of breath, chest pain, edema, cough, abdominal pain, nausea, vomiting, diarrhea, dysuria, fevers, rashes or hallucinations unless otherwise stated above in HPI. ____________________________________________   PHYSICAL EXAM:  VITAL SIGNS: Vitals:   12/28/16 1900 12/28/16 1930  BP: (!) 158/86 (!) 155/81  Pulse: 96 88  Resp: 18 13    Constitutional: Alert and oriented. Chronically ill appearing, in NAD Head: Atraumatic. Nose: No congestion/rhinnorhea. Mouth/Throat: Mucous membranes are moist.  Oropharynx non-erythematous. Neck: No stridor. Painless ROM. No cervical spine tenderness to palpation Cardiovascular: Normal rate, regular rhythm. Grossly normal heart sounds.  Good peripheral circulation.  Right AV fistula with palpable and audible thrill Respiratory: Normal respiratory effort.  No retractions. Lungs coarse bibasilar breathsounds, no rhonchi or crackles Gastrointestinal: obese Soft and nontender. No distention. No abdominal bruits. No CVA tenderness. Musculoskeletal: No lower extremity tenderness nor edema.  No joint effusions. Neurologic:  Normal speech and language. MAR spontaneously Skin:  Skin is warm, dry and intact. No rash noted.   ____________________________________________   LABS (all labs ordered are listed, but only abnormal results are displayed)  Results for orders placed or performed during the hospital encounter of 12/28/16 (from the past 24 hour(s))  Glucose, capillary     Status: Abnormal   Collection Time: 12/28/16  5:30 PM  Result Value Ref Range   Glucose-Capillary 60 (L) 65 - 99 mg/dL   Comment 1 Notify RN   CBC     Status: Abnormal   Collection Time: 12/28/16  5:57 PM  Result Value Ref Range   WBC 7.0 3.6 - 11.0 K/uL   RBC 3.03 (L)  3.80 - 5.20 MIL/uL   Hemoglobin 8.9 (L) 12.0 - 16.0 g/dL   HCT 41.1 (L) 46.4 - 31.4 %   MCV 90.1 80.0 - 100.0 fL   MCH 29.4 26.0 - 34.0 pg   MCHC 32.7 32.0 - 36.0 g/dL   RDW 27.6 (H) 70.1 - 10.0 %   Platelets 231 150 - 440 K/uL  Basic metabolic panel     Status: Abnormal   Collection Time: 12/28/16  5:57 PM  Result Value Ref Range   Sodium 131 (L) 135 - 145 mmol/L   Potassium 5.0  3.5 - 5.1 mmol/L   Chloride 99 (L) 101 - 111 mmol/L   CO2 22 22 - 32 mmol/L   Glucose, Bld 67 65 - 99 mg/dL   BUN 58 (H) 6 - 20 mg/dL   Creatinine, Ser 1.61 (H) 0.44 - 1.00 mg/dL   Calcium 8.1 (L) 8.9 - 10.3 mg/dL   GFR calc non Af Amer 7 (L) >60 mL/min   GFR calc Af Amer 8 (L) >60 mL/min   Anion gap 10 5 - 15  Troponin I     Status: Abnormal   Collection Time: 12/28/16  5:57 PM  Result Value Ref Range   Troponin I 0.03 (HH) <0.03 ng/mL  Glucose, capillary     Status: Abnormal   Collection Time: 12/28/16  5:59 PM  Result Value Ref Range   Glucose-Capillary 61 (L) 65 - 99 mg/dL  Glucose, capillary     Status: Abnormal   Collection Time: 12/28/16  7:02 PM  Result Value Ref Range   Glucose-Capillary 114 (H) 65 - 99 mg/dL   ____________________________________________  EKG My review and personal interpretation at Time: 17:31   Indication: chest pain  Rate: 85  Rhythm: sinus Axis: normal Other: poor r wave progression, no electrical alternans or low voltage to suggest large effusion, no evidence of STEMI, normal intervals. ____________________________________________  RADIOLOGY ____________________________________________   PROCEDURES  Procedure(s) performed:  Procedures    Critical Care performed: no ____________________________________________   INITIAL IMPRESSION / ASSESSMENT AND PLAN / ED COURSE  Pertinent labs & imaging results that were available during my care of the patient were reviewed by me and considered in my medical decision making (see chart for details).  DDX: chronic  pain, dehydration, malnourishment, medication non compliance, electrolye abn  Erin [name] is a 46 y.o. who presents to the ED with History of multiple admissions for chest pain presents with hypoglycemia and recurrent chest pain that is similar nature last time she was admitted just last week. EKG shows no evidence of acute abnormality. Her normal exam is soft and benign. Patient is to decreased oral intake today. Glucose 60 but patient now refusing oral hydration. Will give amp of D50 just to ensure improvement and continued observe patient. Given her risk factors and history noncompliance we'll check basic labs have or do not believe patient has any acute distress at this time.  Blood pressure is normal and patient without any signs or symptoms of acute congestive heart failure exacerbation.  The patient will be placed on continuous pulse oximetry and telemetry for monitoring.  Laboratory evaluation will be sent to evaluate for the above complaints.     Clinical Course as of Dec 28 1945  Wed Dec 28, 2016  1839 Blood work is reassuring.  Troping 0.03 and down from previous of 0.06.  No evidence of acs.  Will continue to monitor for rebound hypoglycemia.  [PR]  1932 Glucose now improved. Chest pain also improved Patient is tolerating oral hydration. Patient is not on any long-acting antihyperglycemic medications. Most likely secondary to poor oral intake.  blood work with potassium of 5.0 but are hemolyzed C Jill Alexanders that her true level is actually much better than that. No evidence of hyperkalemia and the patient is scheduled for dialysis tomorrow morning. I do feel patient is stable for outpatient follow-up in dialysis in the morning.  [PR]    Clinical Course User Index [PR] Willy Eddy, MD     ____________________________________________   FINAL CLINICAL IMPRESSION(S) / ED DIAGNOSES  Final diagnoses:  Low blood sugar  Chest pain, unspecified type      NEW MEDICATIONS STARTED  DURING THIS VISIT:  New Prescriptions   No medications on file     Note:  This document was prepared using Dragon voice recognition software and may include unintentional dictation errors.    Willy Eddy, MD 12/28/16 [phone]

## 2017-01-29 DEATH — deceased

## 2017-03-10 IMAGING — DX DG CHEST 1V PORT
1 series · 1 of 1 positions shown · non-contrast
Comparison: CT 11/08/2016.  Chest x-ray 11/08/2016.

CLINICAL DATA: Pericardiocentesis.

EXAM:
PORTABLE CHEST 1 VIEW

[chest ap]
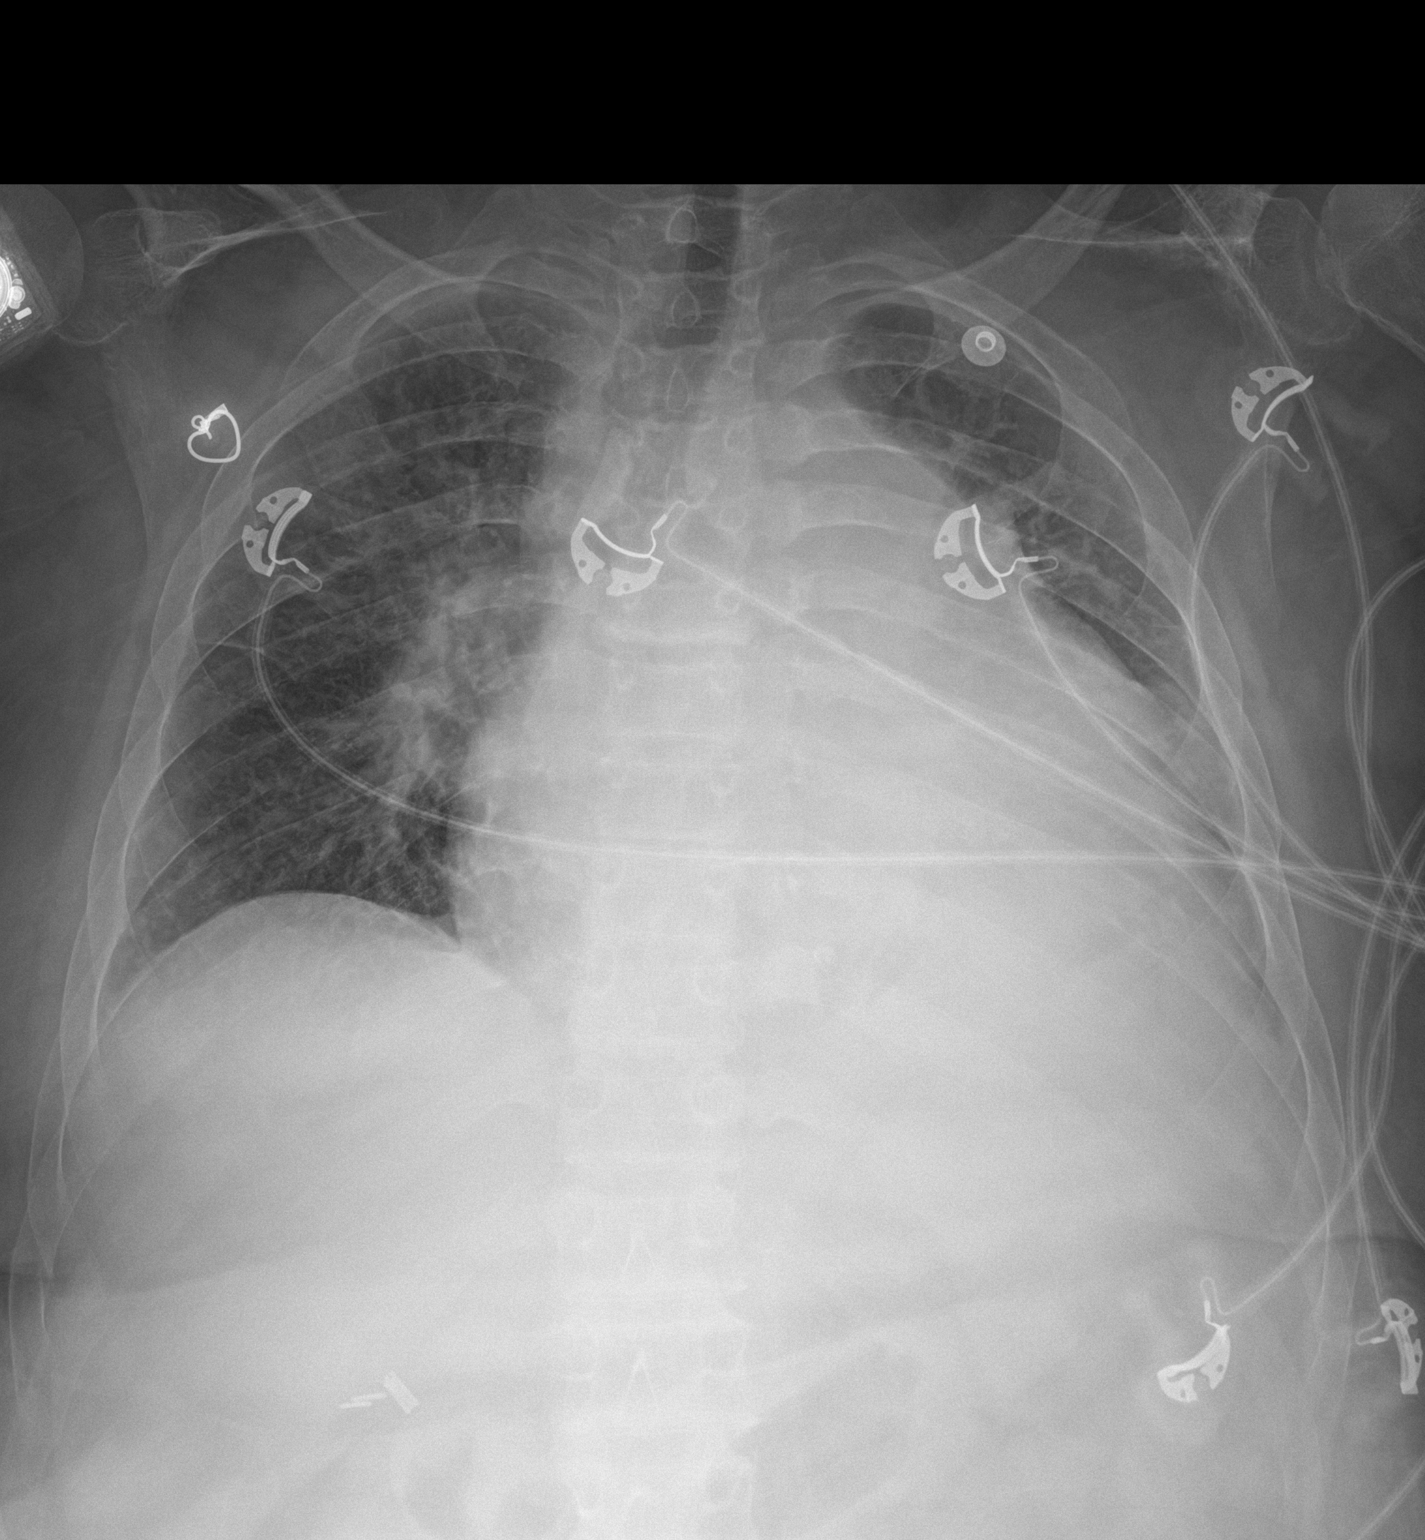

[1 of 1 positions shown; findings below may reference images not displayed]

FINDINGS: Cardiomegaly with bilateral pulmonary interstitial prominence
consistent with congestive heart failure. Small left pleural
effusion. No pneumothorax.
IMPRESSION: Congestive heart failure with bilateral pulmonary interstitial
edema. Degree of cardiomegaly is unchanged from prior exam .

## 2017-03-10 IMAGING — DX DG CHEST 1V PORT
1 series · 1 of 1 positions shown · non-contrast
Comparison: Radiographs October 31, 2016.

CLINICAL DATA: Chest pain.

EXAM:
PORTABLE CHEST 1 VIEW

[chest ap]
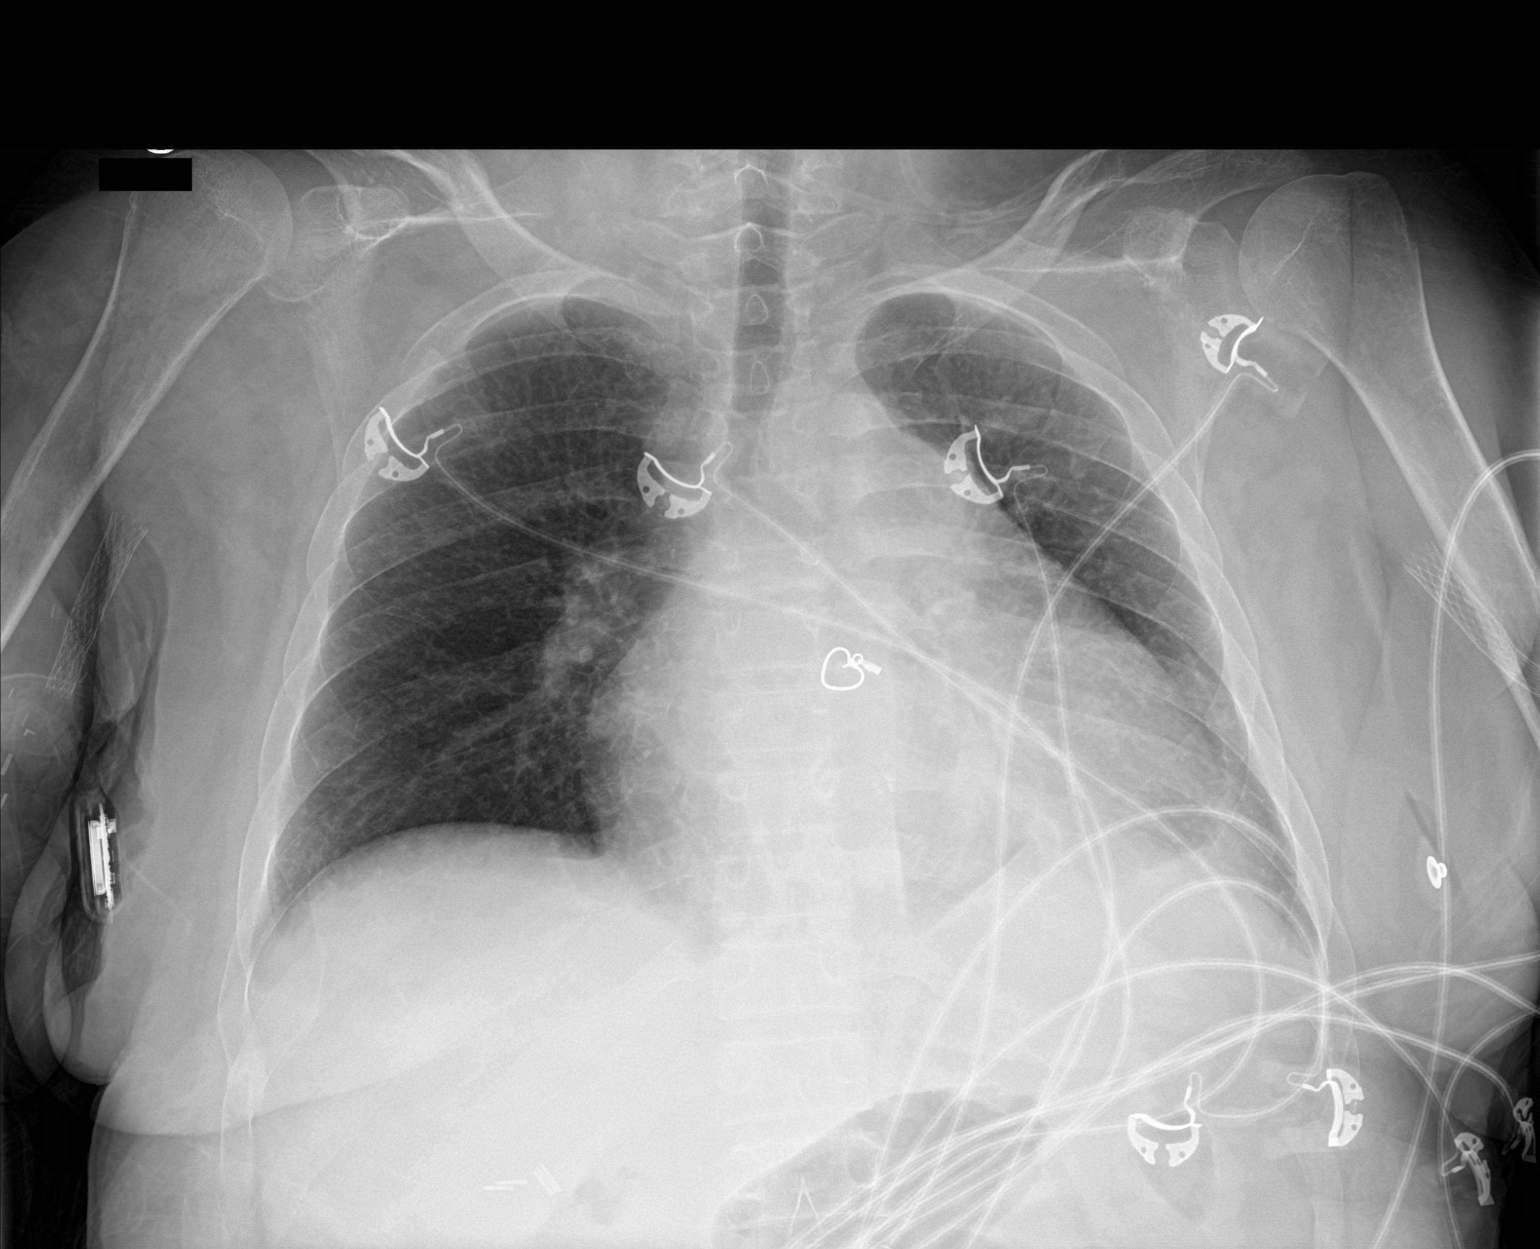

[1 of 1 positions shown; findings below may reference images not displayed]

FINDINGS: Stable cardiomediastinal silhouette. Both lungs are clear. No
pneumothorax or pleural effusion is noted. The visualized skeletal
structures are unremarkable.
IMPRESSION: No acute cardiopulmonary abnormality seen.

## 2017-04-09 IMAGING — CR DG CHEST 2V
1 series · 2 of 2 positions shown · non-contrast
Comparison: November 19, 2016

CLINICAL DATA: Left-sided chest pain

EXAM:
CHEST  2 VIEW

[Series 1: x chest ap · 0.14mm/px · 2 of 2 slices shown]
[im 1/2]
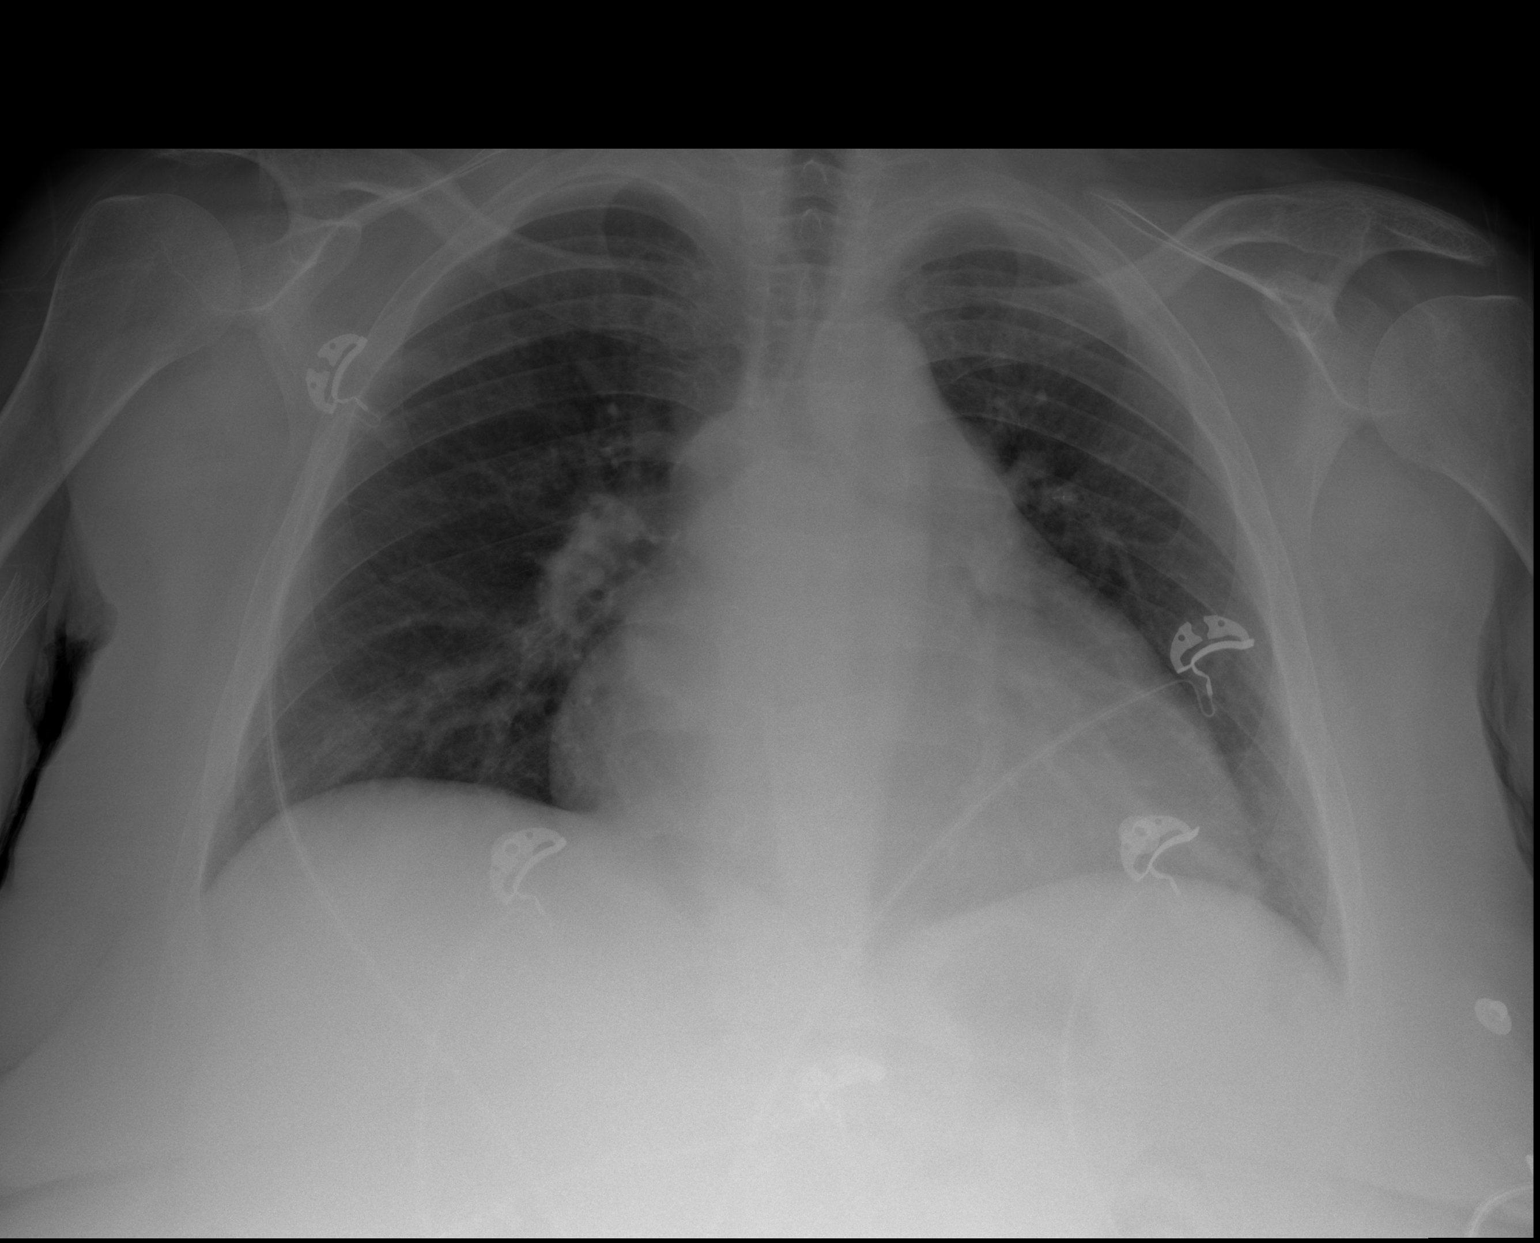
[im 2/2]
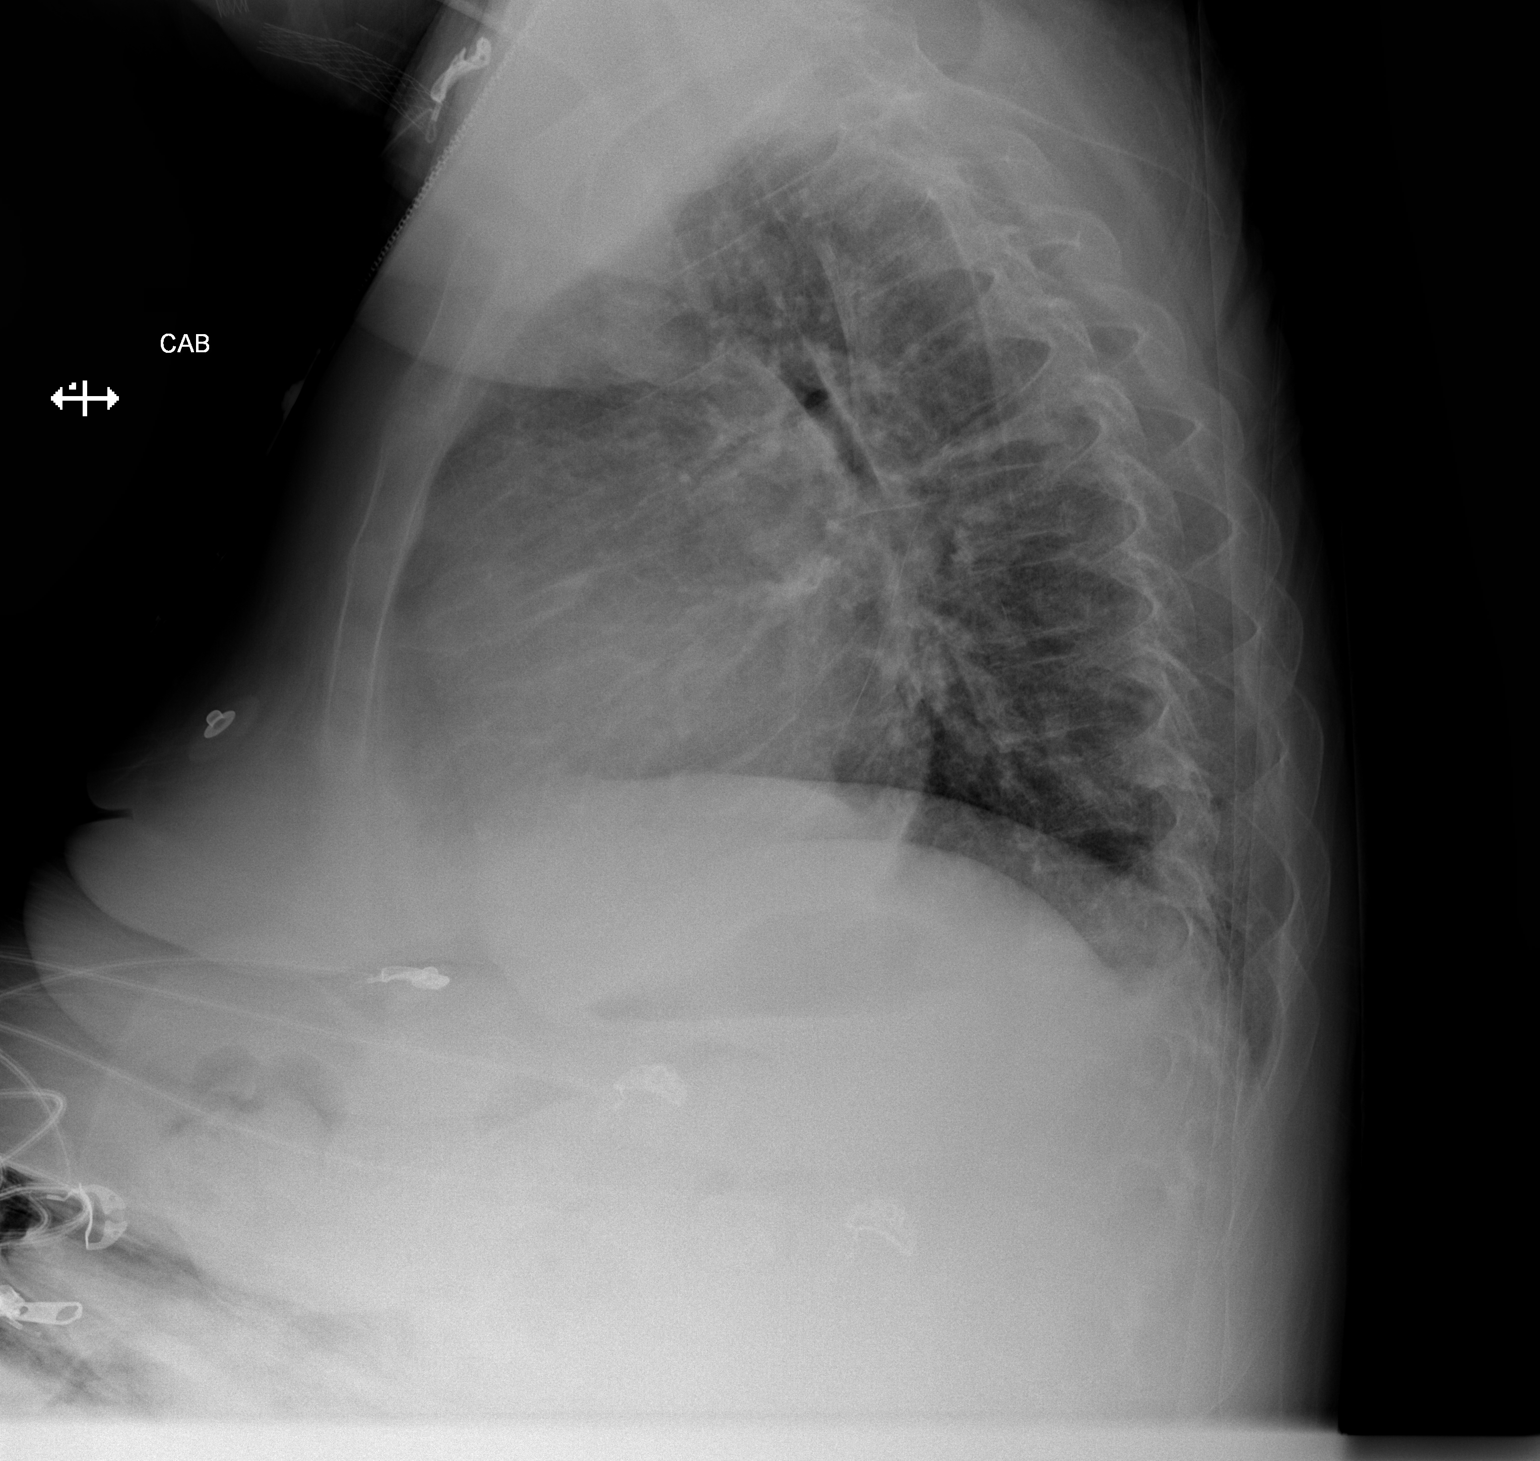

[2 of 2 positions shown; findings below may reference images not displayed]

FINDINGS: Stable cardiomegaly. The hila and mediastinum are normal. No
pulmonary nodules, masses, or focal infiltrates. No overt edema.
IMPRESSION: Stable cardiomegaly.

## 2017-04-25 IMAGING — DX DG CHEST 1V PORT
1 series · 1 of 1 positions shown · non-contrast
Comparison: Chest radiograph dated 12/08/2016

CLINICAL DATA: 45-year-old female with chest pain.

EXAM:
PORTABLE CHEST 1 VIEW

[chest ap]
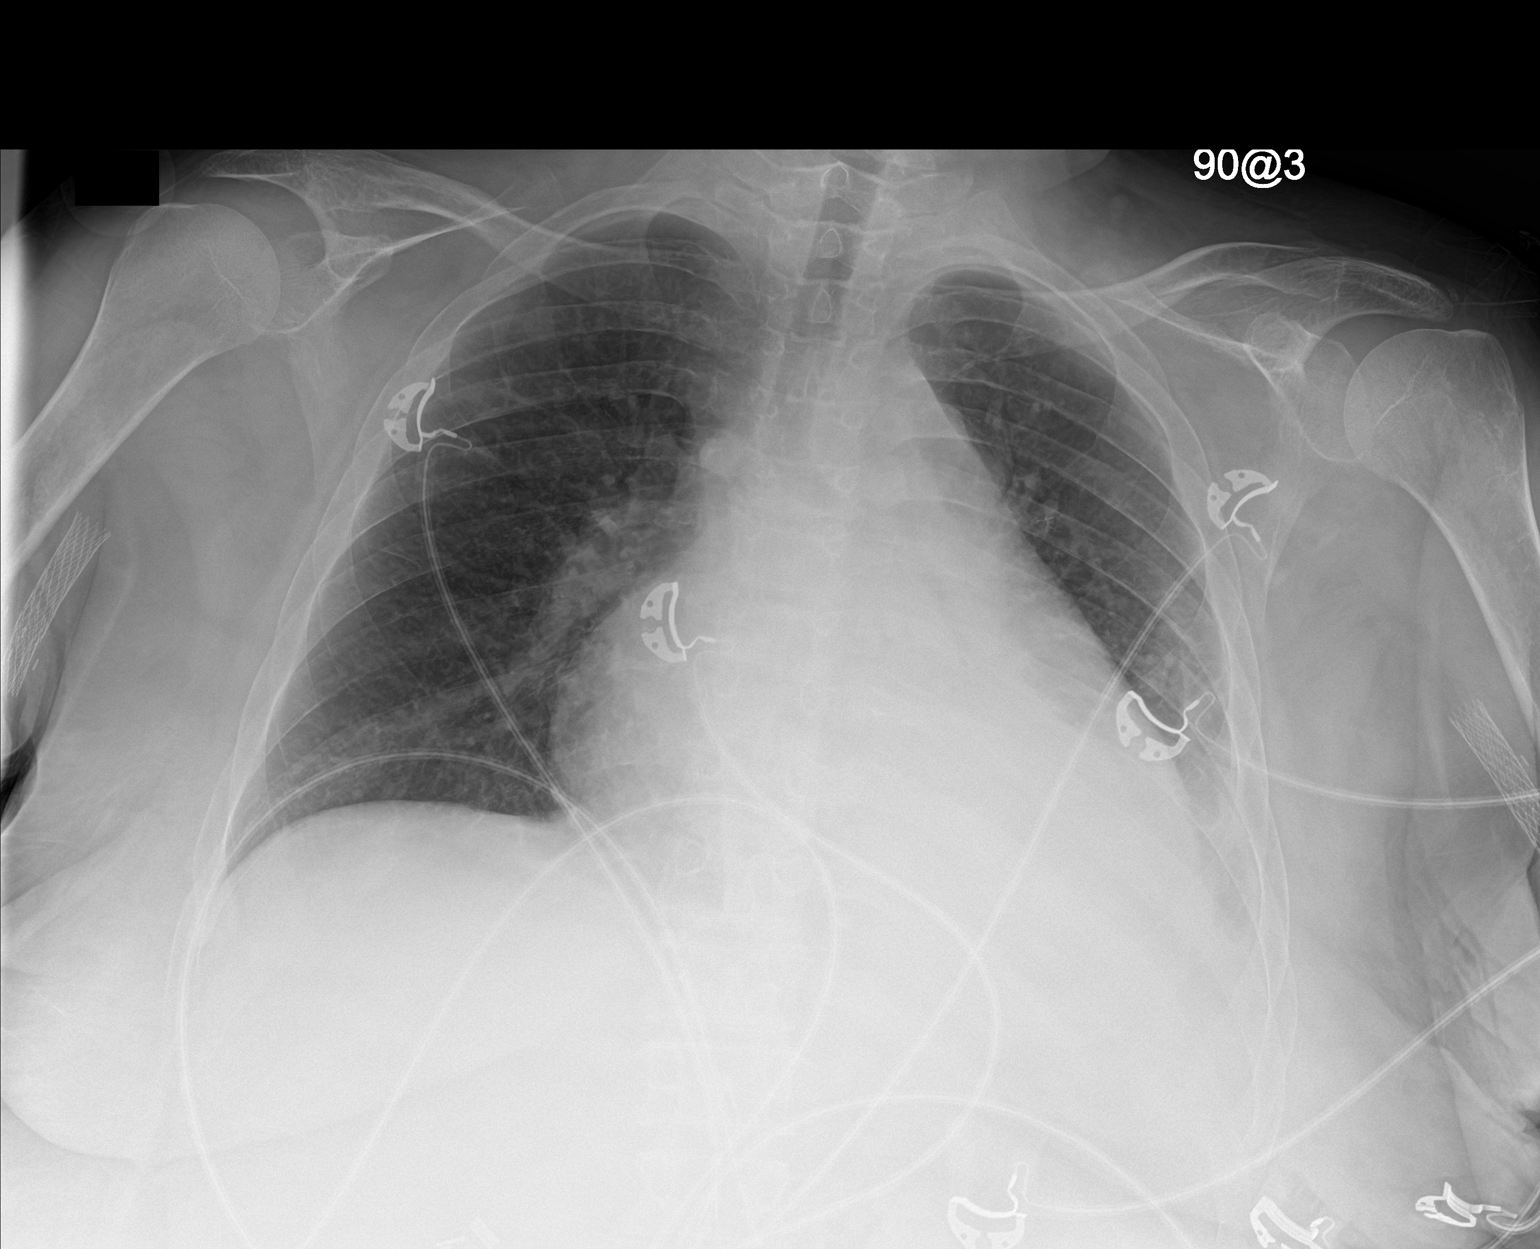

[1 of 1 positions shown; findings below may reference images not displayed]

FINDINGS: There is stable moderate cardiomegaly. There is silhouetting of the
left hemidiaphragm which may be related to an enlarged left
ventricle. Left lung base infiltrate is not excluded. Correlation
with clinical exam recommended. PA and lateral views of the chest
may provide better evaluation. A small left pleural effusion may be
present. The right lung is clear. There is no pneumothorax. No acute
osseous pathology. Vascular stents noted in the proximal arms
bilaterally.
IMPRESSION: 1. Stable cardiomegaly.
2. Silhouetting of the left hemidiaphragm may be related to
cardiomegaly or represent developing infiltrate at the left lung
base. Clinical correlation is recommended. PA and lateral views of
the chest may provide better evaluation.
3. Possible trace left pleural effusion.

## 2017-06-25 IMAGING — US US EXTREM  UP VENOUS*L*
1 series · 13 of 24 positions shown · non-contrast
Comparison: None.

CLINICAL DATA: Swelling and pain left upper extremity 1 day.
Dialysis patient.



[Series 1: us extrem up venous*left* · 0.06mm/px · 46 acquisitions, 13 frames shown]
[im 1/46]
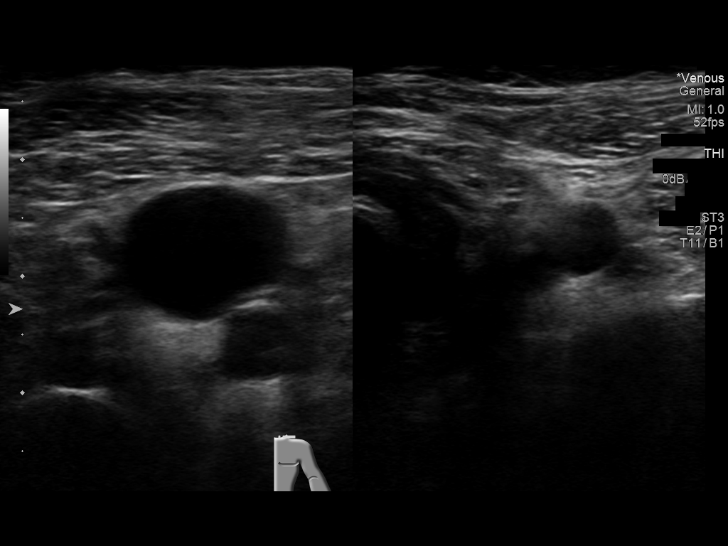
[im 4/46]
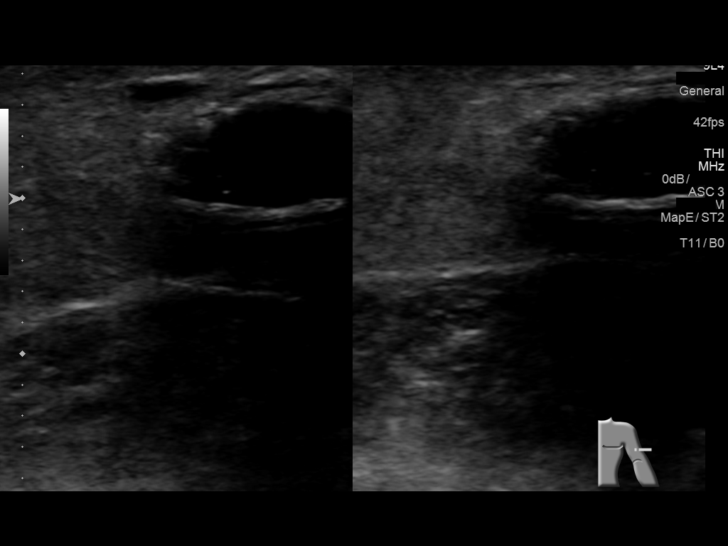
[im 8/46]
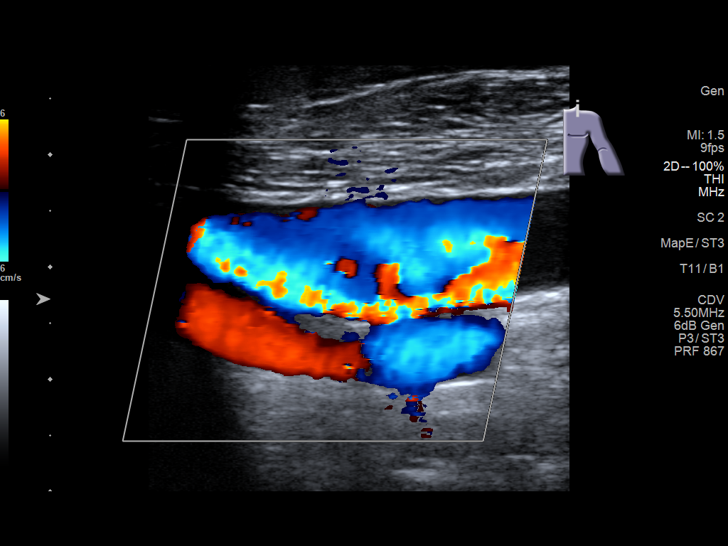
[im 14/46]
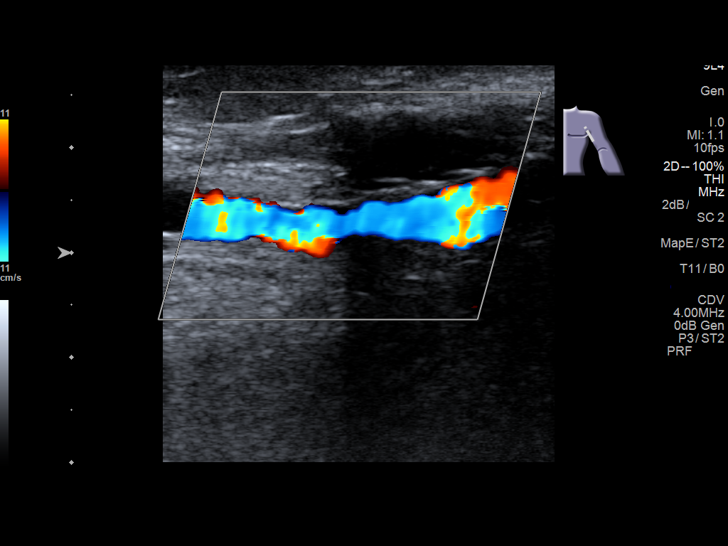
[im 18/46]
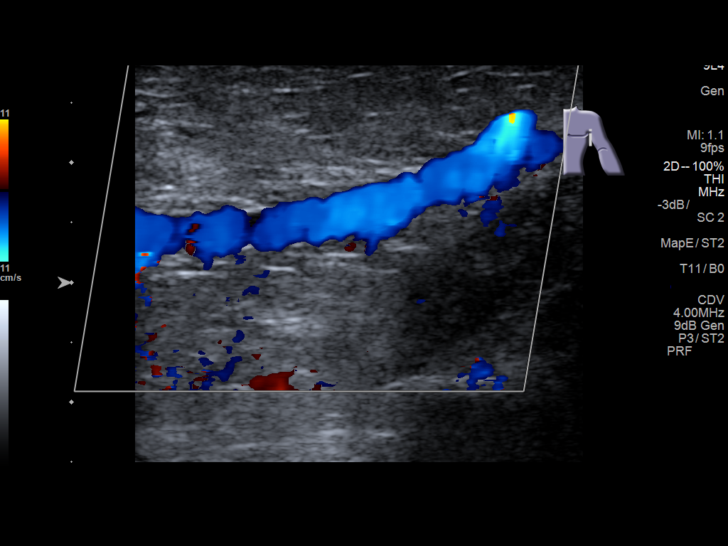
[im 22/46]
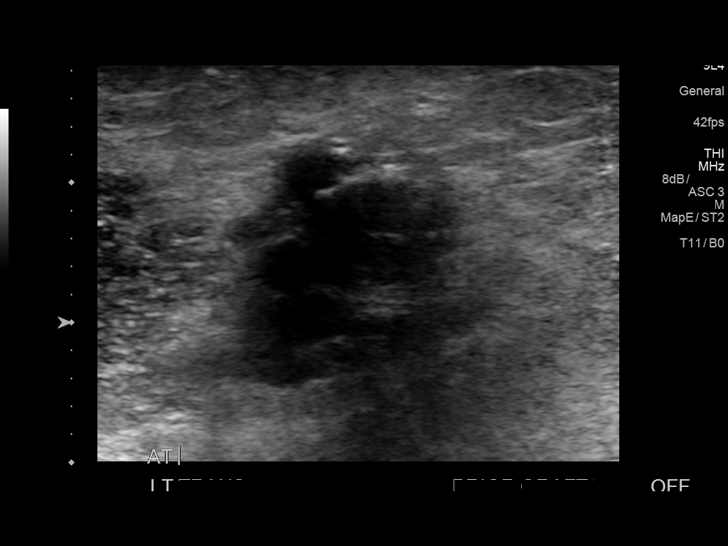
[im 26/46]
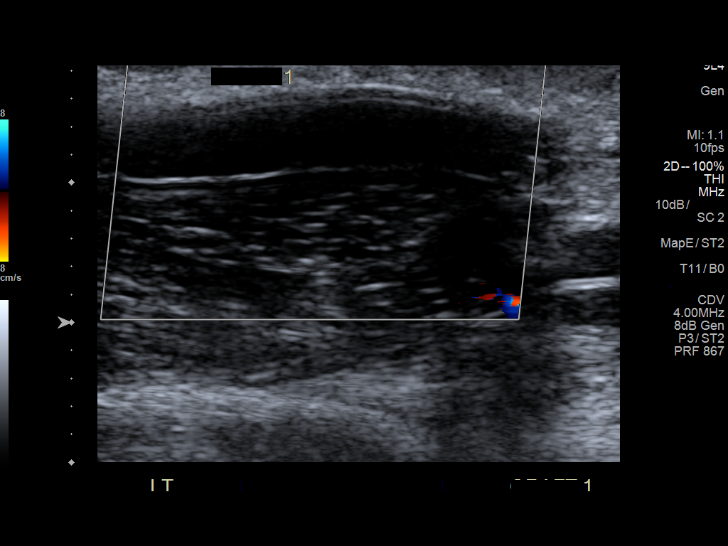
[im 28/46]
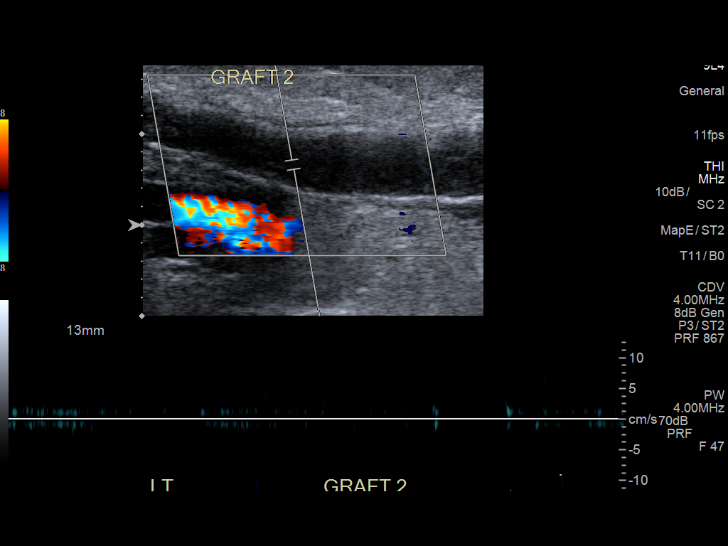
[im 32/46]
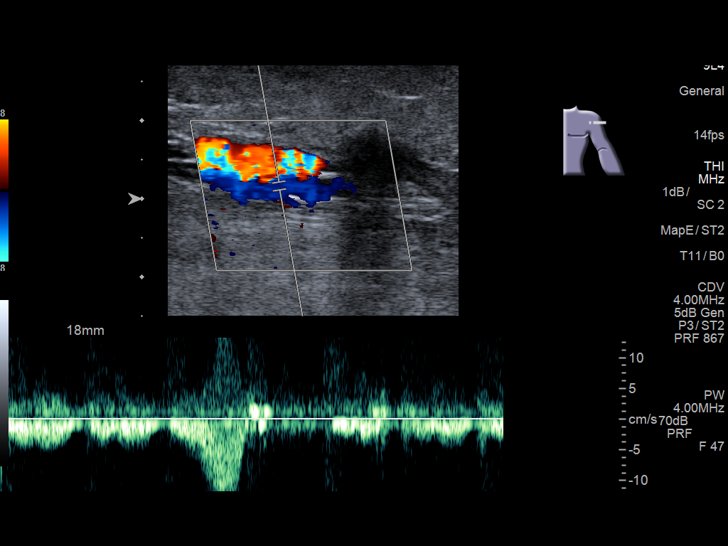
[im 36/46]
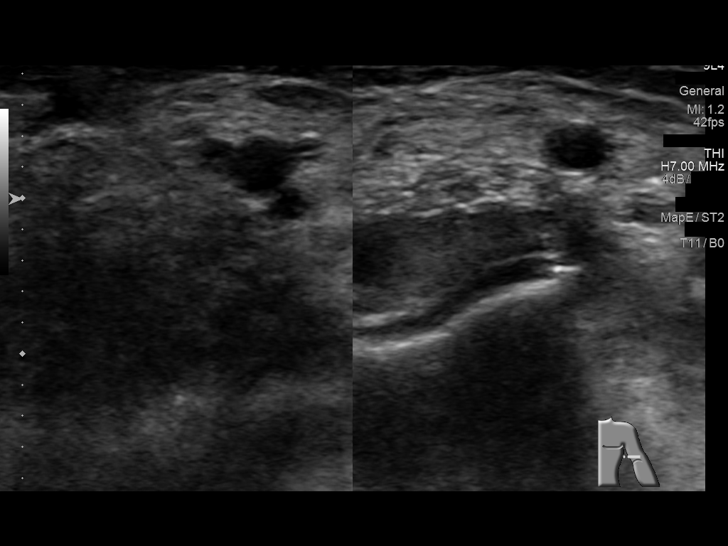
[im 42/46]
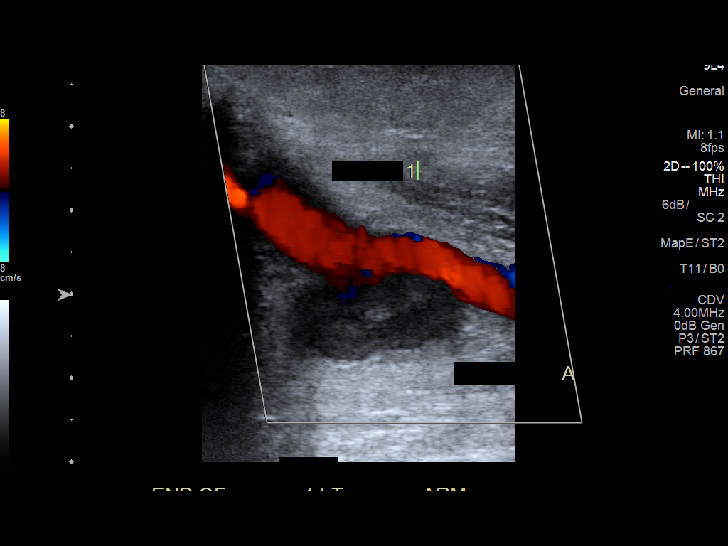
[im 44/46]
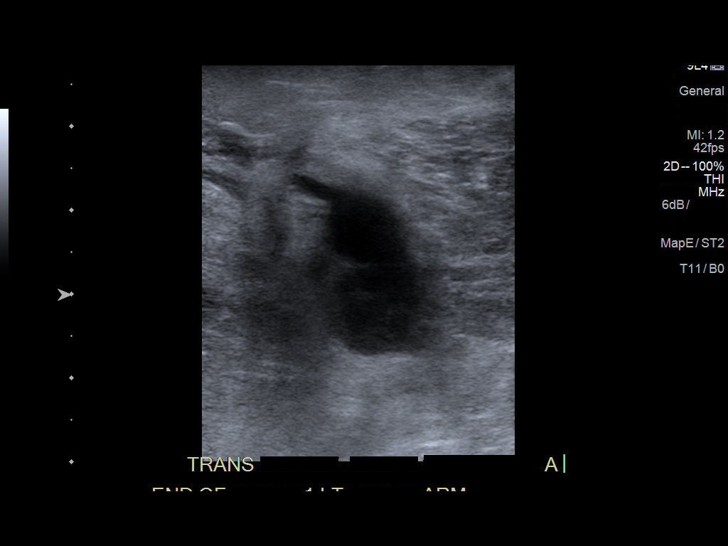
[im 46/46]
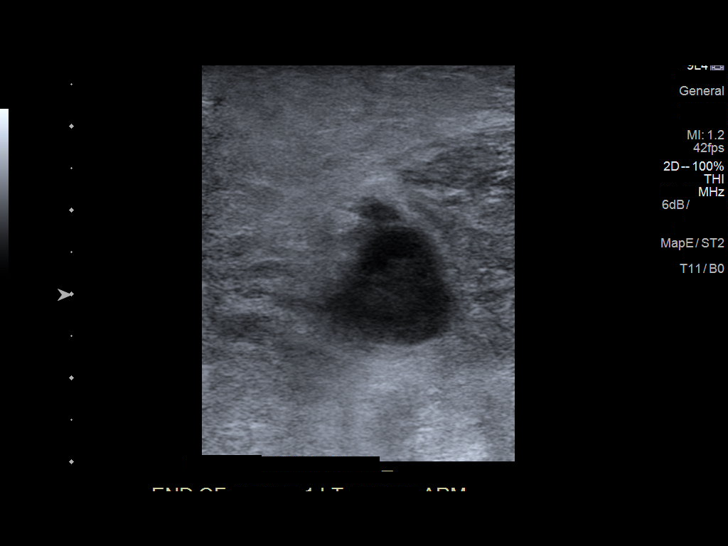

[13 of 24 positions shown; findings below may reference images not displayed]

FINDINGS: Contralateral Subclavian Vein: Respiratory phasicity is normal and
symmetric with the symptomatic side. No evidence of thrombus. Normal
compressibility.

Internal Jugular Vein: No evidence of thrombus. Normal
compressibility, respiratory phasicity and response to augmentation.

Subclavian Vein: No evidence of thrombus. Normal compressibility,
respiratory phasicity and response to augmentation.

Axillary Vein: No evidence of thrombus. Normal compressibility,
respiratory phasicity and response to augmentation.

Cephalic Vein: No evidence of thrombus. Normal compressibility,
respiratory phasicity and response to augmentation.

Basilic Vein: No evidence of thrombus. Normal compressibility,
respiratory phasicity and response to augmentation.

Brachial Veins: No evidence of thrombus. Normal compressibility,
respiratory phasicity and response to augmentation.

Radial Veins: No evidence of thrombus. Normal compressibility,
respiratory phasicity and response to augmentation.

Ulnar Veins: No evidence of thrombus. Normal compressibility,
respiratory phasicity and response to augmentation.

Venous Reflux:  None visualized.

Other Findings: There are 2 known thrombosed dialysis grafts within
the left upper arm both originating from the proximal brachial
artery. Graft 1 anastomoses with the brachial artery more distally
just above the elbow with a bulbous area containing echogenic
thrombus. The brachial artery is patent. The second graft
anastomosis with the brachial artery just distal to the antecubital
fossa.
IMPRESSION: No evidence of deep venous thrombosis.

Two adjacent known thrombosed dialysis grafts in the left upper arm.
